# Patient Record
Sex: Male | Born: 1940 | Race: White | Hispanic: No | Marital: Married | Smoking: Former smoker
Health system: Southern US, Community
[De-identification: ages and names within clinical notes are randomized; demographics above are authoritative.]

## PROBLEM LIST (undated history)

## (undated) DIAGNOSIS — I519 Heart disease, unspecified: Secondary | ICD-10-CM

## (undated) DIAGNOSIS — M1990 Unspecified osteoarthritis, unspecified site: Secondary | ICD-10-CM

## (undated) DIAGNOSIS — I517 Cardiomegaly: Secondary | ICD-10-CM

## (undated) DIAGNOSIS — I4811 Longstanding persistent atrial fibrillation: Secondary | ICD-10-CM

## (undated) DIAGNOSIS — I428 Other cardiomyopathies: Secondary | ICD-10-CM

## (undated) DIAGNOSIS — Z7901 Long term (current) use of anticoagulants: Secondary | ICD-10-CM

## (undated) DIAGNOSIS — E785 Hyperlipidemia, unspecified: Secondary | ICD-10-CM

## (undated) DIAGNOSIS — N400 Enlarged prostate without lower urinary tract symptoms: Secondary | ICD-10-CM

## (undated) DIAGNOSIS — E669 Obesity, unspecified: Secondary | ICD-10-CM

## (undated) DIAGNOSIS — E119 Type 2 diabetes mellitus without complications: Secondary | ICD-10-CM

## (undated) DIAGNOSIS — G4730 Sleep apnea, unspecified: Secondary | ICD-10-CM

## (undated) DIAGNOSIS — I10 Essential (primary) hypertension: Secondary | ICD-10-CM

## (undated) DIAGNOSIS — R7303 Prediabetes: Secondary | ICD-10-CM

## (undated) HISTORY — DX: Longstanding persistent atrial fibrillation: I48.11

## (undated) HISTORY — DX: Unspecified osteoarthritis, unspecified site: M19.90

## (undated) HISTORY — DX: Hyperlipidemia, unspecified: E78.5

## (undated) HISTORY — DX: Other cardiomyopathies: I42.8

## (undated) HISTORY — DX: Sleep apnea, unspecified: G47.30

## (undated) HISTORY — DX: Long term (current) use of anticoagulants: Z79.01

## (undated) HISTORY — DX: Cardiomegaly: I51.7

## (undated) HISTORY — DX: Obesity, unspecified: E66.9

## (undated) HISTORY — PX: VENTRAL HERNIA REPAIR: SHX424

## (undated) HISTORY — DX: Essential (primary) hypertension: I10

## (undated) HISTORY — DX: Heart disease, unspecified: I51.9

---

## 1999-11-12 ENCOUNTER — Encounter: Payer: Self-pay | Admitting: Pulmonary Disease

## 2005-03-10 ENCOUNTER — Ambulatory Visit (HOSPITAL_COMMUNITY): Admission: RE | Admit: 2005-03-10 | Discharge: 2005-03-10 | Payer: Self-pay | Admitting: Internal Medicine

## 2005-03-10 ENCOUNTER — Ambulatory Visit: Payer: Self-pay | Admitting: Internal Medicine

## 2005-03-10 HISTORY — PX: COLONOSCOPY: SHX174

## 2007-12-07 ENCOUNTER — Ambulatory Visit: Payer: Self-pay | Admitting: Pulmonary Disease

## 2007-12-07 DIAGNOSIS — I10 Essential (primary) hypertension: Secondary | ICD-10-CM | POA: Insufficient documentation

## 2007-12-12 ENCOUNTER — Telehealth (INDEPENDENT_AMBULATORY_CARE_PROVIDER_SITE_OTHER): Payer: Self-pay | Admitting: *Deleted

## 2008-02-08 ENCOUNTER — Ambulatory Visit: Payer: Self-pay | Admitting: Pulmonary Disease

## 2008-02-29 ENCOUNTER — Encounter: Payer: Self-pay | Admitting: Pulmonary Disease

## 2008-04-14 ENCOUNTER — Ambulatory Visit (HOSPITAL_COMMUNITY): Admission: RE | Admit: 2008-04-14 | Discharge: 2008-04-15 | Payer: Self-pay | Admitting: Surgery

## 2008-04-14 ENCOUNTER — Encounter (INDEPENDENT_AMBULATORY_CARE_PROVIDER_SITE_OTHER): Payer: Self-pay | Admitting: Surgery

## 2008-06-10 ENCOUNTER — Encounter: Payer: Self-pay | Admitting: Pulmonary Disease

## 2008-09-05 ENCOUNTER — Ambulatory Visit: Payer: Self-pay | Admitting: Pulmonary Disease

## 2008-10-31 ENCOUNTER — Encounter: Payer: Self-pay | Admitting: Pulmonary Disease

## 2008-12-10 ENCOUNTER — Encounter: Payer: Self-pay | Admitting: Pulmonary Disease

## 2009-03-06 ENCOUNTER — Ambulatory Visit (HOSPITAL_COMMUNITY): Admission: RE | Admit: 2009-03-06 | Discharge: 2009-03-08 | Payer: Self-pay | Admitting: Surgery

## 2010-07-09 ENCOUNTER — Encounter (INDEPENDENT_AMBULATORY_CARE_PROVIDER_SITE_OTHER): Payer: Self-pay | Admitting: *Deleted

## 2010-07-09 ENCOUNTER — Encounter: Payer: Self-pay | Admitting: Cardiology

## 2010-07-09 LAB — CONVERTED CEMR LAB
HDL: 34 mg/dL
LDL Cholesterol: 110 mg/dL
LDL Cholesterol: 110 mg/dL
Triglycerides: 176 mg/dL

## 2010-07-16 ENCOUNTER — Encounter (INDEPENDENT_AMBULATORY_CARE_PROVIDER_SITE_OTHER): Payer: Self-pay | Admitting: *Deleted

## 2010-07-16 LAB — CONVERTED CEMR LAB: TSH: 2.024 microintl units/mL

## 2010-07-22 ENCOUNTER — Ambulatory Visit (HOSPITAL_COMMUNITY): Admission: RE | Admit: 2010-07-22 | Discharge: 2010-07-22 | Payer: Self-pay | Admitting: Internal Medicine

## 2010-07-22 ENCOUNTER — Ambulatory Visit: Payer: Self-pay | Admitting: Cardiology

## 2010-07-22 ENCOUNTER — Encounter (INDEPENDENT_AMBULATORY_CARE_PROVIDER_SITE_OTHER): Payer: Self-pay | Admitting: Internal Medicine

## 2010-07-23 ENCOUNTER — Encounter (INDEPENDENT_AMBULATORY_CARE_PROVIDER_SITE_OTHER): Payer: Self-pay | Admitting: *Deleted

## 2010-07-29 ENCOUNTER — Ambulatory Visit: Payer: Self-pay | Admitting: Cardiology

## 2010-07-29 DIAGNOSIS — E785 Hyperlipidemia, unspecified: Secondary | ICD-10-CM | POA: Insufficient documentation

## 2010-08-12 ENCOUNTER — Ambulatory Visit: Payer: Self-pay | Admitting: Cardiology

## 2010-08-12 LAB — CONVERTED CEMR LAB: POC INR: 1.4

## 2010-08-19 ENCOUNTER — Ambulatory Visit: Payer: Self-pay | Admitting: Cardiology

## 2010-08-19 LAB — CONVERTED CEMR LAB: POC INR: 1.5

## 2010-08-24 ENCOUNTER — Encounter: Payer: Self-pay | Admitting: Cardiology

## 2010-08-25 ENCOUNTER — Ambulatory Visit: Payer: Self-pay | Admitting: Cardiology

## 2010-08-25 LAB — CONVERTED CEMR LAB: POC INR: 1.7

## 2010-09-02 ENCOUNTER — Ambulatory Visit: Payer: Self-pay | Admitting: Cardiology

## 2010-09-08 ENCOUNTER — Ambulatory Visit: Payer: Self-pay | Admitting: Cardiology

## 2010-09-15 ENCOUNTER — Ambulatory Visit: Payer: Self-pay | Admitting: Cardiology

## 2010-09-15 LAB — CONVERTED CEMR LAB: POC INR: 3.2

## 2010-09-29 ENCOUNTER — Ambulatory Visit: Payer: Self-pay | Admitting: Cardiovascular Disease

## 2010-10-12 ENCOUNTER — Ambulatory Visit: Payer: Self-pay | Admitting: Cardiology

## 2010-10-12 DIAGNOSIS — R609 Edema, unspecified: Secondary | ICD-10-CM

## 2010-10-12 LAB — CONVERTED CEMR LAB: POC INR: 2.6

## 2010-10-14 ENCOUNTER — Encounter: Payer: Self-pay | Admitting: Cardiology

## 2010-10-19 ENCOUNTER — Ambulatory Visit (HOSPITAL_COMMUNITY)
Admission: RE | Admit: 2010-10-19 | Discharge: 2010-10-19 | Payer: Self-pay | Source: Home / Self Care | Attending: Cardiology | Admitting: Cardiology

## 2010-10-19 ENCOUNTER — Encounter: Payer: Self-pay | Admitting: Cardiology

## 2010-11-08 ENCOUNTER — Ambulatory Visit
Admission: RE | Admit: 2010-11-08 | Discharge: 2010-11-08 | Payer: Self-pay | Source: Home / Self Care | Attending: Cardiology | Admitting: Cardiology

## 2010-11-08 LAB — CONVERTED CEMR LAB: POC INR: 2.2

## 2010-11-23 NOTE — Medication Information (Signed)
Summary: 2 wk protime per checkout on 07/29/10/tg  Anticoagulant Therapy  Managed by: Vashti Hey, RN PCP: Carylon Perches Supervising MD: Dietrich Pates MD, Molly Maduro Indication 1: Atrial Fibrillation Lab Used: [facility] [place] Site: [place] INR POC 1.4  Dietary changes: no    Health status changes: yes       Details: new onset Atrial Fib  Bleeding/hemorrhagic complications: no    Recent/future hospitalizations: no    Any changes in medication regimen? yes       Details: started on coumadin 5mg  qd on 07/30/10  Has 5mg  tablets  Recent/future dental: no  Any missed doses?: no       Is patient compliant with meds? yes       Allergies: No Known Drug Allergies  Anticoagulation Management History:      The patient comes in today for his initial visit for anticoagulation therapy.  He is being anticoagulated because of Atrial Fibrillation .  Positive risk factors for bleeding include an age of 7 years or older.  Negative risk factors for bleeding include no history of CVA/TIA, no history of GI bleeding, and absence of serious comorbidities.  The bleeding index is 'intermediate risk'.  Positive CHADS2 values include History of HTN.  Negative CHADS2 values include History of CHF, Age > 69 years old, History of Diabetes, and Prior Stroke/CVA/TIA.  The start date was 07/30/2010.  Anticoagulation responsible provider: Dietrich Pates MD, Molly Maduro.  INR POC: 1.4.  Cuvette Lot#: [id number].    Anticoagulation Management Assessment/Plan:      The patient's current anticoagulation dose is Warfarin sodium 5 mg tabs: Take 1 tablet by mouth once a day or as  directed by Anticoagulation Clinic.  The target INR is 2.0-3.0.  The next INR is due 08/19/2010.  Anticoagulation instructions were given to patient.  Results were reviewed/authorized by Vashti Hey, RN.  He was notified by Vashti Hey RN.        Coagulation management information includes: not pending DCCV.  Current Anticoagulation Instructions: INR  1.4 Increase coumadin to 7.5mg  once daily except 5mg  on Mondays, Wednesdays and Fridays

## 2010-11-23 NOTE — Assessment & Plan Note (Signed)
Summary: ***per Dr.Fagan for new onset A-Fib/tg   Visit Type:  Initial Consult Referring Provider:  . Primary Provider:  Carylon Perches   History of Present Illness: [name] is seen at the kind request of Dr. Ouida Sills for assessment and treatment of newly diagnosed atrial fibrillation.  This nice gentleman has no history of cardiac disease.  He has never been evaluated by a cardiologist nor has he undergone any significant cardiac testing.  He cannot recall his most recent EKG prior to the one Dr. Ouida Sills obtained a week ago.  Although patient has undergone 2 surgical procedures in the past 3 years, no prior EKGs are available in the [facility] system.   Chest discomfort, dyspnea, orthopnea, PND, lightheadedness and syncope are all denied.  Patient has noted some pedal edema in recent weeks or months.   Records obtained from Dr. Alonza Smoker office and reviewed.   Preventive Screening-Counseling & Management  Alcohol-Tobacco     Smoking Status: current  Current Medications (verified): 1)  Atenolol 100 Mg  Tabs (Atenolol) .... Take 1/2 Tab Two Times A Day 2)  Lisinopril 40 Mg  Tabs (Lisinopril) .... Take 1/2 Tab Two Times A Day 3)  Diltiazem Hcl Er Beads 300 Mg Xr24h-Cap (Diltiazem Hcl Er Beads) .... Take One Capsule By Mouth Daily 4)  Niacin Cr 500 Mg  Cpcr (Niacin) .... Once Daily 5)  Fish Oil   Oil (Fish Oil) .... Once Daily 6)  Warfarin Sodium 5 Mg Tabs (Warfarin Sodium) .... Take 1 Tablet By Mouth Once A Day or As  Directed By Anticoagulation Clinic  Allergies (verified): No Known Drug Allergies  Comments:  Nurse/Medical Assistant: patient reviewed med list that was sent over and put in computer  and he takes atenolol 40mg  1/2 tab two times a day,lisinopril 40 mg 1/2 tab two times a day,diltiazem 90mg  1 tab two times a day .   Past History:  Family History: Last updated: 08-02-2010 Father died at age 71 due to myocardial infarction Mother died at age 24 as the result of an  intracranial neoplasm. Siblings: One sister is alive and well.  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  Past Medical History: Atrial fibrillation-new onset in 06/2010 Hypertension Hyperlipidemia Obstructive sleep apnea-CPAP utilized Degenerative joint disease-right knee     Past Surgical History: Repair of ventral hernia -03/2008 02/2009 Colonoscopy 03/10/2005  EKG  Procedure date:  07/16/2010  Findings:      Atrial fibrillation with a ventricular rate of 160 bpm Slightly delayed R-wave progression Prominent QRS voltage Nondiagnostic inferior Q waves Nondiagnostic T-wave abnormalities No previous tracings for comparison  CXR  Procedure date:  04/11/2008  Findings:       Findings: The heart size and mediastinal contours are normal.  The   lungs are clear.  There is no pleural effusion.  There is probable   post-traumatic deformity of the mid left clavicle.  No acute   osseous findings are seen.    IMPRESSION:   No active cardiopulmonary process.    Read By:  Gerrianne Scale,  M.D.  Echocardiogram  Procedure date:  07/22/2010  Findings:       Indications:   Atrial fibrillation - 427.31.      Study Conclusions    - Left ventricle: The cavity size was mildly dilated. Wall thickness     was increased increased in a pattern of mild to moderate LVH.  Systolic function was mildly to moderately reduced. The estimated     ejection fraction was in the range of 40% to 45%. The apparent     reduction in LV systolic function may be secondary to impaired     diastolic filling resulting from elevated heart rate in AF     alone; there may be no true impairment of contractility. Wall     motion was normal; there were no regional wall motion     abnormalities.   - Left atrium:  moderately dilated.   - Right ventricle: The cavity size was normal.  Wall thickness was     increased. Systolic function was mildly reduced.   - Right atrium: mildly dilated.   - Atrial septum: No defect or patent foramen ovale    LV: mildly dilated; mild to moderate LVH;  EF: 40% to 45%;         no regional wall motion abnormalities. AV:  normal valve. Aorta:  normal MV: Structurally normal; Trivial regurgitation. Left atrium: The atrium was moderately dilated. Atrial septum:normal WJ:XBJYNWGN function was mildly reduced. PV: normal TV: normal; trivial TR  PA: Nl MPA;  Systolic BP could not be accurately estimated. RA:  mildly dilated. Pericardium: no pericardial effusion.     Family History: Father died at age 54 due to myocardial infarction Mother died at age 90 as the result of an intracranial neoplasm. Siblings: One sister is alive and well.  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   Other systems reviewed and are negative.  Vital Signs:  Patient profile:   70 year old male Weight:      240 pounds BMI:     33.59 Pulse rate:   77 / minute BP sitting:   122 / 88  (right arm)  Vitals Entered By: Dreama Saa, CNA (July 29, 2010 1:43 PM)  Physical Exam  General:  Mildly overweight; well-developed; no acute distress: HEENT-[place]/AT; PERRL; EOM intact; conjunctiva and lids nl:  Neck-No JVD; no carotid bruits: Endocrine-slight diffuse thyromegaly: Lungs-No tachypnea, clear without rales, rhonchi or wheezes: CV-cannot palpate PMI; normal S1 and S2; irregular rhythm Abdomen-BS normal; soft and non-tender without masses or organomegaly: MS-No deformities, cyanosis or clubbing: Neurologic-Nl cranial nerves; symmetric strength and tone: Skin- Warm, no sig.  lesions: Extremities-Nl distal pulses; 1-2+ ankle edema    Impression & Recommendations:  Problem # 1:  ATRIAL FIBRILLATION (ICD-427.31) [name] is asymptomatic in atrial fibrillation with a slightly rapid rate.  His antihypertensive regime, which included beta blocker and calcium channel antagonist, protected him from a rapid heart rate and from symptoms.  Diltiazem will be increased to 300 mg q.d. of a long-acting preparation and he will return in 2 weeks for reassessment of rhythm and heart rate.    Anticoagulation with warfarin will be initiated.  Patient has a CHADS risk score of 2 in the most sensitive system and is at low to moderate risk for thromboembolism.  Left ventricular systolic function is mildly impaired, and the patient is asymptomatic.  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  EF will be reassessed once her heart rate has been adequately controlled.  I will plan to see this nice gentleman again in 4 months.  The following medications were removed from the medication  list:    Low-dose Aspirin 81 Mg Tabs (Aspirin) .Marland Kitchen... Take 1 tablet by mouth once a day His updated medication list for this problem includes:    Atenolol 100 Mg Tabs (Atenolol) .Marland Kitchen... Take 1/2 tab two times a day    Warfarin Sodium 5 Mg Tabs (Warfarin sodium) .Marland Kitchen... Take 1 tablet by mouth once a day or as  directed by anticoagulation clinic  Other Orders: 2-D Echocardiogram (2D Echo)  Patient Instructions: 1)  Your physician recommends that you schedule a follow-up appointment in: 2 months 2)  Your physician has recommended you make the following change in your medication: stop aspirin, start warfarin 5 mg daily ,diltiazem 180mg  dialy 3)  You have been referred to coumadin visit in 2 weeks, nurse visit for rhythm strip in 2 weeks also 4)  Your physician has requested that you have an echocardiogram.  Echocardiography is a painless test that uses sound waves to create images of your heart. It  provides your doctor with information about the size and shape of your heart and how well your heart's chambers and valves are working.  This procedure takes approximately one hour. There are no restrictions for this procedure. Prescriptions: WARFARIN SODIUM 5 MG TABS (WARFARIN SODIUM) Take 1 tablet by mouth once a day or as  directed by Anticoagulation Clinic  #60 x 1   Entered by:   Teressa Lower RN   Authorized by:   Kathlen Brunswick, MD, [facility]   Signed by:   Teressa Lower RN on 07/29/2010   Method used:   Electronically to        [facility]  [address] (retail)       [address]       [place], [place]  [address]       Ph: [phone]       Fax: [phone]   RxID:   [id number] DILTIAZEM HCL ER BEADS 300 MG XR24H-CAP (DILTIAZEM HCL ER BEADS) Take one capsule by mouth daily  #30 x 3   Entered by:   Teressa Lower RN   Authorized by:   Kathlen Brunswick, MD, [facility]   Signed by:   Teressa Lower RN on 07/29/2010   Method used:   Electronically to        [facility]  [address] (retail)       [address]       [place], [place]  [address]       Ph: [phone]       Fax: [phone]   RxID:   [id number]

## 2010-11-23 NOTE — Miscellaneous (Signed)
Summary:  Tsh per Dr. Ouida Sills  Clinical Lists Changes  Observations: Added new observation of TSH: 2.024 microintl units/mL (07/16/2010 16:23)

## 2010-11-23 NOTE — Medication Information (Signed)
Summary: ccr-lr  Anticoagulant Therapy  Managed by: Vashti Hey, RN PCP: Carylon Perches Supervising MD: Dietrich Pates MD, Molly Maduro Indication 1: Atrial Fibrillation Lab Used: [facility] [place] Site: [place] INR POC 2.5  Dietary changes: no    Health status changes: no    Bleeding/hemorrhagic complications: no    Recent/future hospitalizations: no    Any changes in medication regimen? no    Recent/future dental: no  Any missed doses?: no       Is patient compliant with meds? yes       Allergies: No Known Drug Allergies  Anticoagulation Management History:      The patient is taking warfarin and comes in today for a routine follow up visit.  Warfarin therapy is being given due to Atrial Fibrillation .  Positive risk factors for bleeding include an age of 69 years or older.  Negative risk factors for bleeding include no history of CVA/TIA, no history of GI bleeding, and absence of serious comorbidities.  The bleeding index is 'intermediate risk'.  Positive CHADS2 values include History of HTN.  Negative CHADS2 values include History of CHF, Age > 89 years old, History of Diabetes, and Prior Stroke/CVA/TIA.  The start date was 07/30/2010.  Anticoagulation responsible Nyeisha Goodall: Dietrich Pates MD, Molly Maduro.  INR POC: 2.5.  Cuvette Lot#: [id number].    Anticoagulation Management Assessment/Plan:      The patient's current anticoagulation dose is Warfarin sodium 10 mg tabs: Take 1 tablet once daily or as directed by coumadin clinic.  The target INR is 2.0-3.0.  The next INR is due 09/15/2010.  Anticoagulation instructions were given to patient.  Results were reviewed/authorized by Vashti Hey, RN.  He was notified by Vashti Hey RN.         Prior Anticoagulation Instructions: INR 1.8 Increase coumadin to 10mg  once daily   Current Anticoagulation Instructions: INR 2.5 Continue coumadin 10mg  once daily Prescriptions: WARFARIN SODIUM 10 MG TABS (WARFARIN SODIUM) Take 1 tablet once daily or  as directed by coumadin clinic  #30 x 3   Entered by:   Vashti Hey RN   Authorized by:   Kathlen Brunswick, MD, [facility]   Signed by:   Vashti Hey RN on 09/08/2010   Method used:   Electronically to        [facility]  [address] (retail)       [address]       [place], [place]  [address]       Ph: [phone]       Fax: [phone]   RxID:   [id number]

## 2010-11-23 NOTE — Medication Information (Signed)
Summary: ccr-lr  Anticoagulant Therapy  Managed by: Joshua Hey, RN PCP: Joshua [name] Supervising MD: Diona Browner MD, Remi Deter Indication 1: Atrial Fibrillation Lab Used: [facility] [place] Site: [place] INR POC 1.8  Dietary changes: no    Health status changes: no    Bleeding/hemorrhagic complications: no    Recent/future hospitalizations: no    Any changes in medication regimen? no    Recent/future dental: no  Any missed doses?: no       Is patient compliant with meds? yes       Allergies: No Known Drug Allergies  Anticoagulation Management History:      The patient is taking warfarin and comes in today for a routine follow up visit.  Warfarin therapy is being given due to Atrial Fibrillation .  Positive risk factors for bleeding include an age of 51 years or older.  Negative risk factors for bleeding include no history of CVA/TIA, no history of GI bleeding, and absence of serious comorbidities.  The bleeding index is 'intermediate risk'.  Positive CHADS2 values include History of HTN.  Negative CHADS2 values include History of CHF, Age > 56 years old, History of Diabetes, and Prior Stroke/CVA/TIA.  The start date was 07/30/2010.  Anticoagulation responsible provider: Diona Browner MD, Remi Deter.  INR POC: 1.8.  Cuvette Lot#: [id number].    Anticoagulation Management Assessment/Plan:      The patient's current anticoagulation dose is Warfarin sodium 5 mg tabs: Take 1 tablet by mouth once a day or as  directed by Anticoagulation Clinic.  The target INR is 2.0-3.0.  The next INR is due 09/08/2010.  Anticoagulation instructions were given to patient.  Results were reviewed/authorized by Joshua Hey, RN.  He was notified by Joshua Hey RN.         Prior Anticoagulation Instructions: INR 1.7 Increase coumadin to 7.5mg  once daily except 10mg  on Mondays, Wednesdays and Fridays  Current Anticoagulation Instructions: INR 1.8 Increase coumadin to 10mg  once daily

## 2010-11-23 NOTE — Medication Information (Signed)
Summary: ccr-lr  Anticoagulant Therapy  Managed by: Vashti Hey, RN PCP: Carylon Perches Supervising MD: Eden Emms MD, Theron Arista Indication 1: Atrial Fibrillation Lab Used: [facility] [place] Site: [place] INR POC 2.4  Dietary changes: no    Health status changes: no    Bleeding/hemorrhagic complications: no    Recent/future hospitalizations: no    Any changes in medication regimen? no    Recent/future dental: no  Any missed doses?: no       Is patient compliant with meds? yes       Allergies: No Known Drug Allergies  Anticoagulation Management History:      The patient is taking warfarin and comes in today for a routine follow up visit.  Warfarin therapy is being given due to Atrial Fibrillation .  Positive risk factors for bleeding include an age of 69 years or older.  Negative risk factors for bleeding include no history of CVA/TIA, no history of GI bleeding, and absence of serious comorbidities.  The bleeding index is 'intermediate risk'.  Positive CHADS2 values include History of HTN.  Negative CHADS2 values include History of CHF, Age > 82 years old, History of Diabetes, and Prior Stroke/CVA/TIA.  The start date was 07/30/2010.  Anticoagulation responsible provider: Eden Emms MD, Theron Arista.  INR POC: 2.4.  Cuvette Lot#: [id number].    Anticoagulation Management Assessment/Plan:      The patient's current anticoagulation dose is Warfarin sodium 10 mg tabs: Take 1 tablet once daily or as directed by coumadin clinic.  The target INR is 2.0-3.0.  The next INR is due 10/12/2010.  Anticoagulation instructions were given to patient.  Results were reviewed/authorized by Vashti Hey, RN.  He was notified by Vashti Hey RN.         Prior Anticoagulation Instructions: INR 3.2 Decrease dose to 10mg  once daily except 5mg  on Wednesdays  Current Anticoagulation Instructions: INR 2.4 Continue coumadin 10mg  once daily except 5mg  on Wednesdays

## 2010-11-23 NOTE — Assessment & Plan Note (Signed)
Summary: 2 wk nurse visit and rhythm strip per checkout on 07/29/10/tg  Nurse Visit   Vital Signs:  Patient profile:   70 year old male Weight:      236 pounds O2 Sat:      96 % on Room air Pulse rate:   70 / minute BP sitting:   130 / 87  (left arm)  Vitals Entered By: Larita Fife Via LPN (August 12, 2010 4:17 PM)  O2 Flow:  Room air  Visit Type:  Rhythm strip/HR check with nurse Referring Provider:  . Primary Provider:  Carylon Perches   History of Present Illness: S: Pt. arrives in office for Coumadin check, rhythm strip and HR check. B: On last OV on 07-29-10 with Dr. Dietrich Pates pt. was advised to increase Diltiazem to 300mg  by mouth once daily and start taking Coumadin. A: Pt. has no complaints at this time. Rhythm strip obtained. Wants to know if he can go back on Diltiazem HCL (even if he has to take three times a day) due to the cost of the long acting Diltiazem. Please advise. R: We will call pt. with Dr. Marvel Plan recommendations.  08/23/10      Noted. [place] Bing, M.D.  Pt. advised.      Larita Fife Via LPN  August 24, 2010 10:07 AM     Current Medications (verified): 1)  Atenolol 100 Mg  Tabs (Atenolol) .... Take 1/2 Tab Two Times A Day 2)  Lisinopril 40 Mg  Tabs (Lisinopril) .... Take 1/2 Tab Two Times A Day 3)  Diltiazem Hcl Er Beads 300 Mg Xr24h-Cap (Diltiazem Hcl Er Beads) .... Take One Capsule By Mouth Daily 4)  Niacin Cr 500 Mg  Cpcr (Niacin) .... Once Daily 5)  Fish Oil   Oil (Fish Oil) .... Once Daily 6)  Warfarin Sodium 5 Mg Tabs (Warfarin Sodium) .... Take 1 Tablet By Mouth Once A Day or As  Directed By Anticoagulation Clinic  Allergies (verified): No Known Drug Allergies  Appended Document: 2 wk nurse visit and rhythm strip per checkout on 07/29/10/tg (see nurse vist from 10-20)) Pt. wants to know if he can go back on Diltiazem HCL (even if he has to take three times a day) due to the cost of the long acting Diltiazem. Please advise  08/29/10 diltiazem CD can  be changed to diltiazem 120 mg t.i.d.  [place] Bing, M.D.  Pt. advised. Larita Fife Via LPN  August 30, 2010 10:32 AM

## 2010-11-23 NOTE — Miscellaneous (Signed)
Summary: LIPID ,07/09/2010,TSH 07/16/2010  Clinical Lists Changes  Observations: Added new observation of TSH: 2.024 microintl units/mL (07/16/2010 10:48) Added new observation of LDL: 110 mg/dL (96/01/5408 81:19) Added new observation of HDL: 34 mg/dL (14/78/2956 21:30) Added new observation of TRIGLYC TOT: 176 mg/dL (86/57/8469 62:95) Added new observation of CHOLESTEROL: 179 mg/dL (28/41/3244 01:02)

## 2010-11-23 NOTE — Medication Information (Signed)
Summary: ccr-lr  Anticoagulant Therapy  Managed by: Vashti Hey, RN PCP: Carylon Perches Supervising MD: Diona Browner MD, Remi Deter Indication 1: Atrial Fibrillation Lab Used: [facility] [place] Site: [place] INR POC 1.5  Dietary changes: no    Health status changes: no    Bleeding/hemorrhagic complications: no    Recent/future hospitalizations: no    Any changes in medication regimen? no    Recent/future dental: no  Any missed doses?: no       Is patient compliant with meds? yes       Allergies: No Known Drug Allergies  Anticoagulation Management History:      The patient is taking warfarin and comes in today for a routine follow up visit.  He is being anticoagulated because of Atrial Fibrillation .  Positive risk factors for bleeding include an age of 23 years or older.  Negative risk factors for bleeding include no history of CVA/TIA, no history of GI bleeding, and absence of serious comorbidities.  The bleeding index is 'intermediate risk'.  Positive CHADS2 values include History of HTN.  Negative CHADS2 values include History of CHF, Age > 41 years old, History of Diabetes, and Prior Stroke/CVA/TIA.  The start date was 07/30/2010.  Anticoagulation responsible provider: Diona Browner MD, Remi Deter.  INR POC: 1.5.  Cuvette Lot#: [id number].    Anticoagulation Management Assessment/Plan:      The patient's current anticoagulation dose is Warfarin sodium 5 mg tabs: Take 1 tablet by mouth once a day or as  directed by Anticoagulation Clinic.  The target INR is 2.0-3.0.  The next INR is due 08/25/2010.  Anticoagulation instructions were given to patient.  Results were reviewed/authorized by Vashti Hey, RN.  He was notified by Vashti Hey RN.         Prior Anticoagulation Instructions: INR 1.4 Increase coumadin to 7.5mg  once daily except 5mg  on Mondays, Wednesdays and Fridays  Current Anticoagulation Instructions: INR 1.5 Increase coumadin to 7.5mg  once daily

## 2010-11-23 NOTE — Medication Information (Signed)
Summary: ccr-lr  Anticoagulant Therapy  Managed by: Vashti Hey, RN PCP: Carylon Perches Supervising MD: Diona Browner MD, Remi Deter Indication 1: Atrial Fibrillation Lab Used: [facility] [place] Site: [place] INR POC 3.2  Dietary changes: no    Health status changes: no    Bleeding/hemorrhagic complications: no    Recent/future hospitalizations: no    Any changes in medication regimen? no    Recent/future dental: no  Any missed doses?: no       Is patient compliant with meds? yes       Allergies: No Known Drug Allergies  Anticoagulation Management History:      The patient is taking warfarin and comes in today for a routine follow up visit.  Warfarin therapy is being given due to Atrial Fibrillation .  Positive risk factors for bleeding include an age of 69 years or older.  Negative risk factors for bleeding include no history of CVA/TIA, no history of GI bleeding, and absence of serious comorbidities.  The bleeding index is 'intermediate risk'.  Positive CHADS2 values include History of HTN.  Negative CHADS2 values include History of CHF, Age > 23 years old, History of Diabetes, and Prior Stroke/CVA/TIA.  The start date was 07/30/2010.  Anticoagulation responsible Karem Farha: Diona Browner MD, Remi Deter.  INR POC: 3.2.  Cuvette Lot#: [id number].    Anticoagulation Management Assessment/Plan:      The patient's current anticoagulation dose is Warfarin sodium 10 mg tabs: Take 1 tablet once daily or as directed by coumadin clinic.  The target INR is 2.0-3.0.  The next INR is due 09/29/2010.  Anticoagulation instructions were given to patient.  Results were reviewed/authorized by Vashti Hey, RN.  He was notified by Vashti Hey RN.         Prior Anticoagulation Instructions: INR 2.5 Continue coumadin 10mg  once daily  Current Anticoagulation Instructions: INR 3.2 Decrease dose to 10mg  once daily except 5mg  on Wednesdays

## 2010-11-23 NOTE — Medication Information (Signed)
Summary: ccr-lr  Anticoagulant Therapy  Managed by: Vashti Hey, RN PCP: Carylon Perches Supervising MD: Dietrich Pates MD, Molly Maduro Indication 1: Atrial Fibrillation Lab Used: [facility] [place] Site: [place] INR POC 1.7  Dietary changes: no    Health status changes: no    Bleeding/hemorrhagic complications: no    Recent/future hospitalizations: no    Any changes in medication regimen? no    Recent/future dental: no  Any missed doses?: no       Is patient compliant with meds? yes       Allergies: No Known Drug Allergies  Anticoagulation Management History:      The patient is taking warfarin and comes in today for a routine follow up visit.  Warfarin therapy is being given due to Atrial Fibrillation .  Positive risk factors for bleeding include an age of 69 years or older.  Negative risk factors for bleeding include no history of CVA/TIA, no history of GI bleeding, and absence of serious comorbidities.  The bleeding index is 'intermediate risk'.  Positive CHADS2 values include History of HTN.  Negative CHADS2 values include History of CHF, Age > 69 years old, History of Diabetes, and Prior Stroke/CVA/TIA.  The start date was 07/30/2010.  Anticoagulation responsible provider: Dietrich Pates MD, Molly Maduro.  INR POC: 1.7.  Cuvette Lot#: [id number].    Anticoagulation Management Assessment/Plan:      The patient's current anticoagulation dose is Warfarin sodium 5 mg tabs: Take 1 tablet by mouth once a day or as  directed by Anticoagulation Clinic.  The target INR is 2.0-3.0.  The next INR is due 08/26/2010.  Anticoagulation instructions were given to patient.  Results were reviewed/authorized by Vashti Hey, RN.  He was notified by Vashti Hey RN.         Prior Anticoagulation Instructions: INR 1.5 Increase coumadin to 7.5mg  once daily   Current Anticoagulation Instructions: INR 1.7 Increase coumadin to 7.5mg  once daily except 10mg  on Mondays, Wednesdays and Fridays

## 2010-11-24 ENCOUNTER — Encounter (INDEPENDENT_AMBULATORY_CARE_PROVIDER_SITE_OTHER): Payer: Self-pay | Admitting: *Deleted

## 2010-11-25 NOTE — Assessment & Plan Note (Signed)
Summary: 2 mth f/u per checkout on 07/29/10/tg/needs INR today   Visit Type:  Follow-up Referring Provider:  . Primary Provider:  Carylon Perches   History of Present Illness: Joshua [name] returns to the office for continued assessment and treatment of atrial fibrillation.  Since his last visit, he has done superbly.  He denies chest discomfort, exertional dyspnea, orthopnea, PND, dizziness or syncope.  He has been physically active without any impairment of exercise tolerance.  He notes no palpitations.  Current Medications (verified): 1)  Atenolol 100 Mg  Tabs (Atenolol) .... Take 1/2 Tab Two Times A Day 2)  Lisinopril 40 Mg  Tabs (Lisinopril) .... Take 1/2 Tab Two Times A Day 3)  Diltiazem Hcl 120 Mg Tabs (Diltiazem Hcl) .... Take 1 Tablet By Mouth Three Times A Day 4)  Niacin Cr 500 Mg  Cpcr (Niacin) .... Once Daily 5)  Fish Oil   Oil (Fish Oil) .... Once Daily 6)  Warfarin Sodium 10 Mg Tabs (Warfarin Sodium) .... Take 1 Tablet Once Daily or As Directed By Coumadin Clinic  Allergies (verified): No Known Drug Allergies  Comments:  Nurse/Medical Assistant: patient brought meds also he uses [facility] in [place]  Past History:  PMH, FH, and Social History reviewed and updated.  Review of Systems       See history of present illness.  Vital Signs:  Patient profile:   70 year old male Weight:      246 pounds O2 Sat:      97 % on Room air Pulse rate:   78 / minute BP sitting:   129 / 81  (right arm)  Vitals Entered By: Dreama Saa, CNA (October 12, 2010 3:22 PM)  O2 Flow:  Room air  Physical Exam  General:  Moderately overweight; well-developed; no acute distress Neck-No JVD; no carotid bruits: Lungs-No tachypnea, clear without rales, rhonchi or wheezes: CV-cannot palpate PMI; normal S1 and S2; irregular rhythm; modest basilar early-systolic ejection murmur Abdomen-BS normal; soft and non-tender without masses or organomegaly: MS-No deformities, cyanosis or  clubbing: Neurologic-Nl cranial nerves; symmetric strength and tone: Skin- Warm; chronic stasis changes in the lower extremities. Extremities-Nl distal pulses; 1-2+ ankle edema    EKG  Procedure date:  10/12/2010  Findings:      Rhythm Strip & 6 Minute Walk Test  Baseline: Atrial fibrillation with controlled ventricular response; heart rate of 75 bpm; oxygen saturation of 98%  Post-exercise: Covered 800 feet without difficulty; heart rate increased to only 88 bpm.  Oxygen saturation-94%.  Impression: Adequate exercise tolerance; excellent heart rate control; normal oxygen saturation.   Impression & Recommendations:  Problem # 1:  ATRIAL FIBRILLATION (ICD-427.31) Heart rate is now well controlled in atrial fibrillation, and patient is asymptomatic.  The likelihood of a durable returned to sinus rhythm without antiarrhythmic therapy is low.  Accordingly, cardioversion will be undertaken.  We will continue the strategy of rate control and anticoagulation.  Initial echocardiogram 3 months ago showed mildly impaired left ventricular systolic function, which may have been a consequence of  inadequate control of heart rate.  Echocardiogram will be repeated.  Problem # 2:  HYPERLIPIDEMIA (ICD-272.4) Adequate control as noted below with modest therapy.  CHOL: 179 (07/09/2010)   LDL: 110 (07/09/2010)   HDL: 34 (07/09/2010)   TG: 176 (07/09/2010)  Problem # 3:  HYPERTENSION (ICD-401.9) Excellent blood pressure control on current therapy, which will be continued.  Problem # 4:  PEDAL EDEMA (ICD-782.3) Likely etiology is venous insufficiency.  In  the absence of any compromise of skin integrity, only conservative measures of salt restriction and leg elevation will be instituted for the present.  Diltiazem may be contributing to peripheral edema, but is necessary for adequate heart rate control.  Other Orders: 2-D Echocardiogram (2D Echo)  Patient Instructions: 1)  Your physician  recommends that you schedule a follow-up appointment in: 6 MONTHS 2)  Your physician has requested that you have an echocardiogram.  Echocardiography is a painless test that uses sound waves to create images of your heart. It provides your doctor with information about the size and shape of your heart and how well your heart's chambers and valves are working.  This procedure takes approximately one hour. There are no restrictions for this procedure. 3)  LEG ELEVATION 4)  SALT RESTRICTION

## 2010-11-25 NOTE — Medication Information (Signed)
Summary: 4 WK PROTIME PER CHECKOUT ON 10/12/10/TG  Anticoagulant Therapy  Managed by: Vashti Hey, RN PCP: Carylon Perches Supervising MD: Diona Browner MD, Remi Deter Indication 1: Atrial Fibrillation Lab Used: [facility] [place] Site: [place] INR POC 2.2  Dietary changes: no    Health status changes: no    Bleeding/hemorrhagic complications: no    Recent/future hospitalizations: no    Any changes in medication regimen? no    Recent/future dental: no  Any missed doses?: no       Is patient compliant with meds? yes       Allergies: No Known Drug Allergies  Anticoagulation Management History:      The patient is taking warfarin and comes in today for a routine follow up visit.  Warfarin therapy is being given due to Atrial Fibrillation .  Positive risk factors for bleeding include an age of 69 years or older.  Negative risk factors for bleeding include no history of CVA/TIA, no history of GI bleeding, and absence of serious comorbidities.  The bleeding index is 'intermediate risk'.  Positive CHADS2 values include History of HTN.  Negative CHADS2 values include History of CHF, Age > 69 years old, History of Diabetes, and Prior Stroke/CVA/TIA.  The start date was 07/30/2010.  Anticoagulation responsible provider: Diona Browner MD, Remi Deter.  INR POC: 2.2.  Cuvette Lot#: [id number].  Exp: 06/2011.    Anticoagulation Management Assessment/Plan:      The patient's current anticoagulation dose is Warfarin sodium 10 mg tabs: Take 1 tablet once daily or as directed by coumadin clinic.  The target INR is 2.0-3.0.  The next INR is due 12/09/2010.  Anticoagulation instructions were given to patient.  Results were reviewed/authorized by Vashti Hey, RN.  He was notified by Vashti Hey RN.         Prior Anticoagulation Instructions: INR 2.6 TODAY  CONTINUE CURRENT DOSE OF 0.5 TABLET ON WEDNESDAY AND 1 TABLET ALL OTHER DAYS  Current Anticoagulation Instructions: INR 2.2 Continue coumadin 10mg  once  daily except 5mg  on Wednesdays

## 2010-11-25 NOTE — Miscellaneous (Signed)
Summary: Lipid  Clinical Lists Changes  Observations: Added new observation of LDL: 110 mg/dL (16/07/9603 54:09) Added new observation of HDL: 34 mg/dL (81/19/1478 29:56) Added new observation of TRIGLYC TOT: 176 mg/dL (21/30/8657 84:69) Added new observation of CHOLESTEROL: 179 mg/dL (62/95/2841 32:44) Added new observation of TSH: 2.024 microintl units/mL (07/09/2010 11:15)

## 2010-11-25 NOTE — Medication Information (Signed)
Summary: protime/tg  Anticoagulant Therapy  Managed by: Teressa Lower, RN PCP: Carylon Perches Supervising MD: Eden Emms MD, Theron Arista Indication 1: Atrial Fibrillation Lab Used: [facility] [place] Site: [place] INR POC 2.6  Dietary changes: no    Health status changes: no    Bleeding/hemorrhagic complications: no    Recent/future hospitalizations: no    Any changes in medication regimen? no    Recent/future dental: no  Any missed doses?: no       Is patient compliant with meds? yes       Current Medications (verified): 1)  Atenolol 100 Mg  Tabs (Atenolol) .... Take 1/2 Tab Two Times A Day 2)  Lisinopril 40 Mg  Tabs (Lisinopril) .... Take 1/2 Tab Two Times A Day 3)  Diltiazem Hcl 120 Mg Tabs (Diltiazem Hcl) .... Take 1 Tablet By Mouth Three Times A Day 4)  Niacin Cr 500 Mg  Cpcr (Niacin) .... Once Daily 5)  Fish Oil   Oil (Fish Oil) .... Once Daily 6)  Warfarin Sodium 10 Mg Tabs (Warfarin Sodium) .... Take 1 Tablet Once Daily or As Directed By Coumadin Clinic  Allergies (verified): No Known Drug Allergies  Anticoagulation Management History:      The patient is taking warfarin and comes in today for a routine follow up visit.  Warfarin therapy is being given due to Atrial Fibrillation .  Positive risk factors for bleeding include an age of 69 years or older.  Negative risk factors for bleeding include no history of CVA/TIA, no history of GI bleeding, and absence of serious comorbidities.  The bleeding index is 'intermediate risk'.  Positive CHADS2 values include History of HTN.  Negative CHADS2 values include History of CHF, Age > 75 years old, History of Diabetes, and Prior Stroke/CVA/TIA.  The start date was 07/30/2010.  Anticoagulation responsible provider: Eden Emms MD, Theron Arista.  INR POC: 2.6.  Cuvette Lot#: [id number].  Exp: 06/2011.    Anticoagulation Management Assessment/Plan:      The patient's current anticoagulation dose is Warfarin sodium 10 mg tabs: Take 1 tablet  once daily or as directed by coumadin clinic.  The target INR is 2.0-3.0.  The next INR is due 11/08/2010.  Anticoagulation instructions were given to patient.  Results were reviewed/authorized by Teressa Lower, RN.  He was notified by Teressa Lower RN.         Prior Anticoagulation Instructions: INR 2.4 Continue coumadin 10mg  once daily except 5mg  on Wednesdays  Current Anticoagulation Instructions: INR 2.6 TODAY  CONTINUE CURRENT DOSE OF 0.5 TABLET ON WEDNESDAY AND 1 TABLET ALL OTHER DAYS

## 2010-12-01 NOTE — Letter (Signed)
Summary: [place] [facility]  [address], [place] [address]   Phone: [phone]  Fax: [phone]      November 24, 2010 MRN: [id number]   GIOVONNI POIRIER [address] [place], [place]  [address]   Dear Mr. Gaudin,  Your test ordered by Selena Batten has been reviewed by your physician (or physician assistant) and was found to be normal or stable. Your physician (or physician assistant) felt no changes were needed at this time.  ____ Echocardiogram  _x___ Cardiac Stress Test  ____ Lab Work  ____ Peripheral vascular study of arms, legs or neck  ____ CT scan or X-ray  ____ Lung or Breathing test  ____ Other:  No change in medical treatment at this time, per Dr. Dietrich Pates.  Thank you, Aianna Fahs Allyne Gee RN    [place] Bing, MD, Lenise Arena.C.Gaylord Shih, MD, F.A.C.C Lewayne Bunting, MD, F.A.C.C Nona Dell, MD, F.A.C.C Charlton Haws, MD, Lenise Arena.C.C

## 2010-12-09 ENCOUNTER — Encounter (INDEPENDENT_AMBULATORY_CARE_PROVIDER_SITE_OTHER): Payer: Medicare Other

## 2010-12-09 ENCOUNTER — Encounter: Payer: Self-pay | Admitting: Cardiology

## 2010-12-09 DIAGNOSIS — Z7901 Long term (current) use of anticoagulants: Secondary | ICD-10-CM

## 2010-12-09 DIAGNOSIS — I4891 Unspecified atrial fibrillation: Secondary | ICD-10-CM

## 2010-12-15 NOTE — Medication Information (Signed)
Summary: [name]/TR  Anticoagulant Therapy  Managed by: Joshua Hey, RN PCP: Carylon Perches Supervising MD: Diona Browner MD, Remi Deter Indication 1: Atrial Fibrillation Lab Used: [facility] [place] Site: [name] INR POC 2.6  Dietary changes: no    Health status changes: no    Bleeding/hemorrhagic complications: no    Recent/future hospitalizations: no    Any changes in medication regimen? no    Recent/future dental: no  Any missed doses?: no       Is patient compliant with meds? yes       Allergies: No Known Drug Allergies  Anticoagulation Management History:      The patient is taking warfarin and comes in today for a routine follow up visit.  Anticoagulation is being administered due to Atrial Fibrillation .  Positive risk factors for bleeding include an age of 69 years or older.  Negative risk factors for bleeding include no history of CVA/TIA, no history of GI bleeding, and absence of serious comorbidities.  The bleeding index is 'intermediate risk'.  Positive CHADS2 values include History of HTN.  Negative CHADS2 values include History of CHF, Age > 71 years old, History of Diabetes, and Prior Stroke/CVA/TIA.  The start date was 07/30/2010.  Anticoagulation responsible provider: Diona Browner MD, Remi Deter.  INR POC: 2.6.  Cuvette Lot#: [id number].  Exp: 06/2011.    Anticoagulation Management Assessment/Plan:      The patient's current anticoagulation dose is Warfarin sodium 10 mg tabs: Take 1 tablet once daily or as directed by coumadin clinic.  The target INR is 2.0-3.0.  The next INR is due 01/06/2011.  Anticoagulation instructions were given to patient.  Results were reviewed/authorized by Joshua Hey, RN.  He was notified by Joshua Hey RN.         Prior Anticoagulation Instructions: INR 2.2 Continue coumadin 10mg  once daily except 5mg  on Wednesdays  Current Anticoagulation Instructions: INR 2.6 Continue coumadin 10mg  once daily except 5mg  on Wednesdays

## 2011-01-06 ENCOUNTER — Encounter (INDEPENDENT_AMBULATORY_CARE_PROVIDER_SITE_OTHER): Payer: Medicare Other

## 2011-01-06 ENCOUNTER — Encounter: Payer: Self-pay | Admitting: Cardiology

## 2011-01-06 DIAGNOSIS — I4891 Unspecified atrial fibrillation: Secondary | ICD-10-CM

## 2011-01-06 DIAGNOSIS — Z7901 Long term (current) use of anticoagulants: Secondary | ICD-10-CM

## 2011-01-11 ENCOUNTER — Other Ambulatory Visit: Payer: Self-pay | Admitting: Dermatology

## 2011-01-11 NOTE — Medication Information (Signed)
Summary: ccr-lr  Anticoagulant Therapy  Managed by: Vashti Hey, RN PCP: Carylon Perches Supervising MD: Dietrich Pates MD, Molly Maduro Indication 1: Atrial Fibrillation Lab Used: [facility] [place] Site: [place] INR POC 2.5  Dietary changes: no    Health status changes: no    Bleeding/hemorrhagic complications: no    Recent/future hospitalizations: no    Any changes in medication regimen? no    Recent/future dental: no  Any missed doses?: no       Is patient compliant with meds? yes       Allergies: No Known Drug Allergies  Anticoagulation Management History:      The patient is taking warfarin and comes in today for a routine follow up visit.  Anticoagulation is being administered due to Atrial Fibrillation .  Positive risk factors for bleeding include an age of 69 years or older.  Negative risk factors for bleeding include no history of CVA/TIA, no history of GI bleeding, and absence of serious comorbidities.  The bleeding index is 'intermediate risk'.  Positive CHADS2 values include History of HTN.  Negative CHADS2 values include History of CHF, Age > 69 years old, History of Diabetes, and Prior Stroke/CVA/TIA.  The start date was 07/30/2010.  Anticoagulation responsible provider: Dietrich Pates MD, Molly Maduro.  INR POC: 2.5.  Cuvette Lot#: [id number].  Exp: 06/2011.    Anticoagulation Management Assessment/Plan:      The patient's current anticoagulation dose is Warfarin sodium 10 mg tabs: Take 1 tablet once daily or as directed by coumadin clinic.  The target INR is 2.0-3.0.  The next INR is due 02/03/2011.  Anticoagulation instructions were given to patient.  Results were reviewed/authorized by Vashti Hey, RN.  He was notified by Vashti Hey RN.         Prior Anticoagulation Instructions: INR 2.6 Continue coumadin 10mg  once daily except 5mg  on Wednesdays  Current Anticoagulation Instructions: INR 2.5 Continue coumadin 10mg  once daily except 5mg  on Wednesdays

## 2011-01-12 ENCOUNTER — Other Ambulatory Visit: Payer: Self-pay | Admitting: Cardiology

## 2011-01-12 NOTE — Telephone Encounter (Signed)
Patient needs refill on Coumadin called to [facility] pharmacy in [place] / tg

## 2011-01-14 ENCOUNTER — Telehealth: Payer: Self-pay | Admitting: Cardiology

## 2011-01-14 MED ORDER — WARFARIN SODIUM 10 MG PO TABS: 10.0000 mg | ORAL_TABLET | Freq: Every day | ORAL | Status: DC

## 2011-01-14 NOTE — Telephone Encounter (Signed)
WARFIN 10MG  #30 WAS TOLF BY [facility] WE DENIDED IT. IF HE COULD GET MORE THEN 30 DAY WORTH THAT WOULD BE GREAT.

## 2011-02-01 LAB — URINALYSIS, ROUTINE W REFLEX MICROSCOPIC
Bilirubin Urine: NEGATIVE
Glucose, UA: NEGATIVE mg/dL
Ketones, ur: NEGATIVE mg/dL
Leukocytes, UA: NEGATIVE
Nitrite: NEGATIVE
Protein, ur: NEGATIVE mg/dL
pH: 5 (ref 5.0–8.0)

## 2011-02-01 LAB — BASIC METABOLIC PANEL
BUN: 17 mg/dL (ref 6–23)
CO2: 25 mEq/L (ref 19–32)
Calcium: 9.2 mg/dL (ref 8.4–10.5)
Chloride: 111 mEq/L (ref 96–112)
Creatinine, Ser: 0.97 mg/dL (ref 0.4–1.5)
GFR calc Af Amer: >60 mL/min (ref >60)
Glucose, Bld: 116 mg/dL — ABNORMAL HIGH (ref 70–99)

## 2011-02-01 LAB — DIFFERENTIAL
Basophils Absolute: 0 10*3/uL (ref 0.0–0.1)
Basophils Relative: 0 % (ref 0–1)
Eosinophils Absolute: 0.1 10*3/uL (ref 0.0–0.7)
Neutro Abs: 2 10*3/uL (ref 1.7–7.7)
Neutrophils Relative %: 58 % (ref 43–77)

## 2011-02-01 LAB — URINE MICROSCOPIC-ADD ON

## 2011-02-01 LAB — CBC
MCHC: 35.1 g/dL (ref 30.0–36.0)
MCV: 91.3 fL (ref 78.0–100.0)
RDW: 12.8 % (ref 11.5–15.5)

## 2011-02-01 LAB — PROTIME-INR: INR: 1.1 (ref 0.00–1.49)

## 2011-02-02 ENCOUNTER — Encounter: Payer: Self-pay | Admitting: Cardiology

## 2011-02-02 DIAGNOSIS — I4891 Unspecified atrial fibrillation: Secondary | ICD-10-CM

## 2011-02-02 DIAGNOSIS — Z7901 Long term (current) use of anticoagulants: Secondary | ICD-10-CM | POA: Insufficient documentation

## 2011-02-03 ENCOUNTER — Ambulatory Visit (INDEPENDENT_AMBULATORY_CARE_PROVIDER_SITE_OTHER): Payer: Medicare Other | Admitting: *Deleted

## 2011-02-03 DIAGNOSIS — I4891 Unspecified atrial fibrillation: Secondary | ICD-10-CM

## 2011-02-03 DIAGNOSIS — Z7901 Long term (current) use of anticoagulants: Secondary | ICD-10-CM

## 2011-02-03 LAB — POCT INR: INR: 2.2

## 2011-02-08 ENCOUNTER — Other Ambulatory Visit: Payer: Self-pay | Admitting: Cardiology

## 2011-03-03 ENCOUNTER — Encounter: Payer: Medicare Other | Admitting: *Deleted

## 2011-03-08 NOTE — Op Note (Signed)
NAME:  Joshua [name], Joshua [name] NO.:  [id number]   MEDICAL RECORD NO.:  [id number]          PATIENT TYPE:  AMB   LOCATION:  DAY                          FACILITY:  [facility]   PHYSICIAN:  Velora Heckler, MD      DATE OF BIRTH:  [birth year]   DATE OF PROCEDURE:  04/14/2008  DATE OF DISCHARGE:                               OPERATIVE REPORT   PREOPERATIVE DIAGNOSIS:  Ventral hernia.   POSTOPERATIVE DIAGNOSIS:  Incarcerated ventral hernia.   PROCEDURE:  Laparoscopic ventral hernia repair with polyester mesh.   SURGEON:  Velora Heckler, M.D., FACS   ASSISTANT:  None.   ANESTHESIA:  General.   ESTIMATED BLOOD LOSS:  Minimal.   PREPARATION:  Betadine.   COMPLICATIONS:  None.   INDICATIONS:  The patient is 70 year old white male from [place],  [place].  He is referred by Dr. Carylon Perches with small ventral  hernia.  This was noted after a history of blunt trauma.  It causes him  mild discomfort.  He now comes to surgery for repair.   BODY OF REPORT:  Procedure was done in O.R. #1 at the [facility].  The patient is brought to the operating room and  placed in a supine position on the operating room table.  Following  administration of general anesthesia, the patient is prepped and draped  in the usual strict aseptic fashion.  After ascertaining that an  adequate level of anesthesia had been achieved, an incision is made the  left upper quadrant at the costal margin.  Using an OptiVu trocar, the  peritoneal cavity is entered under direct vision and insufflated with  carbon dioxide.  Laparoscope is introduced.  There is an incarcerated  ventral hernia just to the left of midline in the upper abdomen.  It  appears to contain omentum and a portion of the falciform ligament.  Operative ports are placed in the bilateral lower quadrants and in the  right midabdomen.  Using a Glassman clamp, hernia contents are partially  reduced.  Using a harmonic  scalpel, the remainder of the hernia contents  are excised from the subcutaneous tissues and peritoneum.  The omentum  is freed from what appears to be a large adipose mass.  The falciform  ligament is taken down along approximately one-half of its length.  The  hernia contents are then completely excised with the harmonic scalpel.  They are placed into an EndoCatch bag and retrieved through the right  upper quadrant port without difficulty.  They are submitted to pathology  for review.   Next, a 12 cm circular sheet of Parietex polyester mesh is selected.  It  is marked at the four cardinal points and sutured with #1 Novofil  sutures.  Mesh is then dampened with saline and rolled and inserted  through a trocar into the peritoneal cavity, where it is deployed.  Incisions are made on the abdominal wall correlating with the four  sutures.  Using an EndoCatch, the sutures are retrieved and brought  through the abdominal wall full thickness.  All four sutures are  pulled  taut, elevating the mesh over the defect with the least 4-5 cm of  overlap on all sides.  Sutures were tied securely.  Using a titanium  tack device, the mesh is then secured to the abdominal wall with two  concentric circles of titanium tacks.  Mesh appears to have good  coverage of the defect, with good approximation to the anterior  abdominal wall.  Good hemostasis is noted in the peritoneal cavity.  Trocars are removed under direct vision, and good hemostasis is noted at  all trocar sites.  Pneumoperitoneum is released.  All ports are removed.  Port sites are anesthetized with local anesthetic.  Surgical wounds are  closed with stainless steel staples.  Sterile dressings are applied.  The patient is awakened from anesthesia and brought to the recovery room  in stable condition.  The patient tolerated the procedure well.      Velora Heckler, MD  Electronically Signed     TMG/MEDQ  D:  04/14/2008  T:  04/14/2008   Job:  [id number]   cc:   Kingsley Callander. Ouida Sills, MD  Fax: [phone]

## 2011-03-08 NOTE — Op Note (Signed)
NAME:  Joshua [name], Joshua [name] NO.:  [id number]   MEDICAL RECORD NO.:  [id number]          PATIENT TYPE:  AMB   LOCATION:  DAY                          FACILITY:  [facility]   PHYSICIAN:  Velora Heckler, MD      DATE OF BIRTH:  [birth year]   DATE OF PROCEDURE:  03/06/2009  DATE OF DISCHARGE:                               OPERATIVE REPORT   PREOPERATIVE DIAGNOSIS:  Recurrent ventral hernia.   POSTOPERATIVE DIAGNOSIS:  Recurrent ventral hernia.   PROCEDURE:  Laparoscopic repair of ventral hernia with Ethicon Proceed  mesh.   SURGEON:  Velora Heckler, MD, FACS   ANESTHESIA:  General.   ESTIMATED BLOOD LOSS:  Minimal.   PREPARATION:  Betadine.   COMPLICATIONS:  None.   INDICATIONS:  The patient is a 70 year old white male from [place],  [place].  He had undergone laparoscopic ventral hernia repair  with polyester mesh in June 2009.  Shortly after surgery, he developed  discomfort at the umbilicus.  This was just below the level of the prior  repair.  After several months of observation it gradually increased in  size and became progressively more symptomatic.  On physical exam, a  ventral hernia was diagnosed.  The patient now comes to the operating  room for repair.   DESCRIPTION OF PROCEDURE:  The procedure was done in OR #6 at the [facility].  The patient was brought to the operating room  and placed in a supine position on the operating room table.  Following  administration of general anesthesia, the patient was prepped and draped  in the usual strict aseptic fashion.  After ascertaining that an  adequate level of anesthesia had been achieved, a left upper quadrant  incision was made with a #15 blade.  Dissection using an Optiview  trocar, the peritoneal cavity was then entered under direct vision.  The  abdomen was insufflated with carbon dioxide.  The laparoscope was  introduced.  There were significant adhesions of the omentum to  the  previous mesh repair.  Representative photographs were made.  Operative  ports were placed in the left lower quadrant, right lower quadrant, and  right upper quadrant, all with 5 mm trocars.  Using the harmonic  scalpel, adhesions to the mesh were taken down.  The mesh appeared to be  well positioned.  There does not appear to be in the curling of the  edges.  There was not appear to be any defect associated with the mesh.  Again, representative photographs were made.  In the midline after  careful inspection, there does appear to be a small hernia defect above  the level of the umbilicus and below the previous mesh repair.  A 10 x  15 cm sheet of Ethicon Proceed mesh was then selected in order to cover  this defect.  It was placed on the abdominal wall and the borders were  marked appropriately.  5-0 Novofil sutures were placed  circumferentially.  The mesh was rolled and inserted through the 10 mm  trocar into the peritoneal cavity and deployed.  Incisions were made on  the abdominal wall to correspond with the Novofil sutures.  Using an  EndoCatch, the suture tails were retrieved through the abdominal wall.  These were pulled taut allowing the mesh to come in proximity to the  anterior abdominal wall.  Before tying the sutures, the mesh was pulled  away from the side of the abdominal wall to assure that it was covering  the hernia defect and the area of the umbilicus with wide 4 cm overlap  circumferentially.  The sutures were then tied securely.  The mesh was  affixed to the abdominal wall with titanium tacks circumferentially.  Good approximation of the mesh to the abdominal wall was achieved with  wide coverage of the previous hernia defect and overlap with the prior  polyester mesh.  Again, representative photographs were made.  Good  hemostasis was noted.  Omentum was used to cover the small bowel.  Pneumoperitoneum was released and ports were removed.  The port sites  were  anesthetized with local anesthetic.  All wounds were closed with  interrupted 4-0 Monocryl subcuticular sutures.  The wounds were washed  and dried and Benzoin and Steri-Strips were applied.  Sterile dressings  were applied.  An abdominal binder was applied.  The patient was  awakened from anesthesia and brought to the recovery room.  The patient  tolerated the procedure well.      Velora Heckler, MD  Electronically Signed     TMG/MEDQ  D:  03/06/2009  T:  03/06/2009  Job:  [id number]   cc:   Kingsley Callander. Ouida Sills, MD  Fax: [phone]   Barbaraann Share, MD,FCCP  [address]  [place]  [place] [address]

## 2011-03-10 ENCOUNTER — Ambulatory Visit (INDEPENDENT_AMBULATORY_CARE_PROVIDER_SITE_OTHER): Payer: Medicare Other | Admitting: *Deleted

## 2011-03-10 DIAGNOSIS — Z7901 Long term (current) use of anticoagulants: Secondary | ICD-10-CM

## 2011-03-10 DIAGNOSIS — I4891 Unspecified atrial fibrillation: Secondary | ICD-10-CM

## 2011-03-10 LAB — POCT INR: INR: 2.7

## 2011-03-11 NOTE — Op Note (Signed)
NAME:  Joshua [name], Joshua [name]              ACCOUNT NO.:  [id number]   MEDICAL RECORD NO.:  [id number]          PATIENT TYPE:  AMB   LOCATION:  DAY                           FACILITY:  [facility]   PHYSICIAN:  Lionel December, M.D.    DATE OF BIRTH:  [birth year]   DATE OF PROCEDURE:  03/10/2005  DATE OF DISCHARGE:                                 OPERATIVE REPORT   PROCEDURE:  Colonoscopy.   INDICATION:  Joshua [name] is a 70 year old Caucasian male who is here for screening  colonoscopy.  Family history is negative for colorectal carcinoma.   The procedure risks were reviewed the patient, informed consent was  obtained.   PREMEDICATION:  Demerol 50 mg IV, Versed 4 mg IV.   FINDINGS:  Procedure performed in endoscopy suite.  The patient's vital  signs and O2 saturation were monitored during procedure and remained stable.  The patient was placed in the left lateral recumbent position and rectal  examination performed.  No abnormality noted on external or digital exam.  The Olympus video scope was placed in the rectum and advanced under vision  into sigmoid colon and beyond.  Preparation was satisfactory.  The scope was  passed to the cecum, which was identified by ileocecal valve and appendiceal  orifice.  Pictures taken for the record.  As the scope was withdrawn,  colonic mucosa was carefully examined.  There were no polyps and/or tumor  masses.  A single small diverticulum at distal sigmoid colon.  Rectal mucosa  was normal.  The scope was retroflexed to examine anorectal junction, which  was unremarkable.  Endoscope was straightened and withdrawn.  The patient  tolerated the procedure well.   FINAL DIAGNOSIS:  Single small diverticulum at sigmoid colon, otherwise  normal colonoscopy.   RECOMMENDATIONS:  1.  He will resume his atenolol and diet as before.  2.  Yearly Hemoccults.  3.  He may consider next screening exam in 10 years from now.      NR/MEDQ  D:  03/10/2005  T:  03/10/2005  Job:   [id number]   cc:   Kingsley Callander. Ouida Sills, MD  [address]  [place]  [place] [address]  Fax: [phone]

## 2011-04-04 ENCOUNTER — Ambulatory Visit (INDEPENDENT_AMBULATORY_CARE_PROVIDER_SITE_OTHER): Payer: Medicare Other | Admitting: *Deleted

## 2011-04-04 DIAGNOSIS — I4891 Unspecified atrial fibrillation: Secondary | ICD-10-CM

## 2011-04-04 DIAGNOSIS — Z7901 Long term (current) use of anticoagulants: Secondary | ICD-10-CM

## 2011-04-04 LAB — POCT INR: INR: 2.8

## 2011-04-06 ENCOUNTER — Encounter: Payer: Self-pay | Admitting: Cardiology

## 2011-04-07 ENCOUNTER — Encounter: Payer: Medicare Other | Admitting: *Deleted

## 2011-05-06 ENCOUNTER — Ambulatory Visit: Payer: Medicare Other | Admitting: Cardiology

## 2011-05-06 ENCOUNTER — Ambulatory Visit (INDEPENDENT_AMBULATORY_CARE_PROVIDER_SITE_OTHER): Payer: Medicare Other | Admitting: *Deleted

## 2011-05-06 DIAGNOSIS — Z7901 Long term (current) use of anticoagulants: Secondary | ICD-10-CM

## 2011-05-06 DIAGNOSIS — I4891 Unspecified atrial fibrillation: Secondary | ICD-10-CM

## 2011-05-25 ENCOUNTER — Ambulatory Visit (INDEPENDENT_AMBULATORY_CARE_PROVIDER_SITE_OTHER): Payer: Medicare Other | Admitting: Cardiology

## 2011-05-25 ENCOUNTER — Ambulatory Visit (INDEPENDENT_AMBULATORY_CARE_PROVIDER_SITE_OTHER): Payer: Medicare Other | Admitting: *Deleted

## 2011-05-25 ENCOUNTER — Encounter: Payer: Self-pay | Admitting: Cardiology

## 2011-05-25 DIAGNOSIS — G4730 Sleep apnea, unspecified: Secondary | ICD-10-CM

## 2011-05-25 DIAGNOSIS — I10 Essential (primary) hypertension: Secondary | ICD-10-CM

## 2011-05-25 DIAGNOSIS — Z7901 Long term (current) use of anticoagulants: Secondary | ICD-10-CM

## 2011-05-25 DIAGNOSIS — M1990 Unspecified osteoarthritis, unspecified site: Secondary | ICD-10-CM | POA: Insufficient documentation

## 2011-05-25 DIAGNOSIS — I4891 Unspecified atrial fibrillation: Secondary | ICD-10-CM

## 2011-05-25 DIAGNOSIS — E785 Hyperlipidemia, unspecified: Secondary | ICD-10-CM

## 2011-05-25 DIAGNOSIS — R609 Edema, unspecified: Secondary | ICD-10-CM

## 2011-05-25 LAB — POCT INR: INR: 3

## 2011-05-25 MED ORDER — ATENOLOL 50 MG PO TABS: 75.0000 mg | ORAL_TABLET | Freq: Two times a day (BID) | ORAL | Status: DC

## 2011-05-25 MED ORDER — WARFARIN SODIUM 10 MG PO TABS: 10.0000 mg | ORAL_TABLET | Freq: Every day | ORAL | Status: DC

## 2011-05-25 NOTE — Assessment & Plan Note (Signed)
Lipid profile is somewhat suboptimal, but the patient has no indication for treatment with statins.  Maintenance of a heart healthy diet is advised.

## 2011-05-25 NOTE — Assessment & Plan Note (Signed)
Diltiazem may be contributing to peripheral edema and will be discontinued.  Metoprolol will be increased to 75 mg b.id to maintain control of heart rate.  Leg elevation and a low-salt diet were reinforced.  A nursing visit is planned in 2 weeks to reassess heart rate control.

## 2011-05-25 NOTE — Progress Notes (Signed)
HPI : Joshua [name] returns to the office as scheduled for continued assessment and treatment of atrial fibrillation.  Since last visit, he has done quite well.  He denies dyspnea, orthopnea, palpitations, lightheadedness or syncope.  He has developed pedal edema that is somewhat troublesome to him.  He denies consuming excessive amounts of salt and has maintained leg elevation to some extent.  He has no history of venous disease or other problems in the extremities.  Current Outpatient Prescriptions on File Prior to Visit  Medication Sig Dispense Refill  . lisinopril (PRINIVIL,ZESTRIL) 40 MG tablet Take 40 mg by mouth 2 (two) times daily. Take 1/2 tab       . niacin 500 MG CR capsule Take 500 mg by mouth daily.        . Omega-3 Fatty Acids (FISH OIL) 1000 MG CAPS Take by mouth daily.        Marland Kitchen DISCONTD: warfarin (COUMADIN) 10 MG tablet Take 1 tablet (10 mg total) by mouth daily.  60 tablet  1     No Known Allergies    Past medical history, social history, and family history reviewed and updated.  ROS: See history of present illness.  PHYSICAL EXAM: BP 129/77  Pulse 68  Ht 5\' 11"  (1.803 m)  Wt 103.42 kg (228 lb)  BMI 31.80 kg/m2  SpO2 96%  General-Well developed; no acute distress Body habitus-overweight Neck-No JVD; no carotid bruits Lungs-clear lung fields; resonant to percussion Cardiovascular-normal PMI; split S1 and normal S2 Abdomen-normal bowel sounds; soft and non-tender without masses or organomegaly Musculoskeletal-No deformities, no cyanosis or clubbing Neurologic-Normal cranial nerves; symmetric strength and tone Skin-Warm, no significant lesions Extremities-distal pulses intact; 1-2+ pitting ankle and pretibial edema  EKG: (Rhythm Strip) Atrial fibrillation with a controlled ventricular response; heart rate of 80 bpm  ASSESSMENT AND PLAN:

## 2011-05-25 NOTE — Patient Instructions (Addendum)
Your physician recommends that you schedule a follow-up appointment in:9 months LOW SALT DIET-handout LEG ELEVATION Your physician has recommended you make the following change in your medication: stop diltiazem, increase atenolol to 75mg  twice daily NURSE VISIT WITH A RHYTHM STRIP IN 2 WEEKS Your physician recommends that you return for lab work in: TODAY STOOL CARDS- FOLLOW DIRECTIONS IN PACKET

## 2011-05-25 NOTE — Assessment & Plan Note (Signed)
Blood pressure control is excellent, but diltiazem will be withdrawn in an attempt to improve peripheral edema.  Atenolol dosage will be increased.  In the setting of medication changes, control of hypertension will be monitored.

## 2011-05-25 NOTE — Assessment & Plan Note (Signed)
Heart rate control is excellent, and patient appears to be asymptomatic.  We will continue the strategy of heart rate control and anticoagulation.

## 2011-05-25 NOTE — Assessment & Plan Note (Signed)
Anticoagulation has been stable and therapeutic.  Stool for Hemoccult testing and a CBC will be obtained to exclude occult GI blood loss.

## 2011-05-26 ENCOUNTER — Encounter: Payer: Self-pay | Admitting: Cardiology

## 2011-05-26 DIAGNOSIS — D696 Thrombocytopenia, unspecified: Secondary | ICD-10-CM | POA: Insufficient documentation

## 2011-05-26 LAB — CBC WITH DIFFERENTIAL/PLATELET
Basophils Absolute: 0 10*3/uL (ref 0.0–0.1)
Eosinophils Relative: 1 % (ref 0–5)
Lymphocytes Relative: 37 % (ref 12–46)
Lymphs Abs: 2.2 10*3/uL (ref 0.7–4.0)
MCV: 94.4 fL (ref 78.0–100.0)
Neutro Abs: 3.2 10*3/uL (ref 1.7–7.7)
Neutrophils Relative %: 54 % (ref 43–77)
Platelets: 141 10*3/uL — ABNORMAL LOW (ref 150–400)
RBC: 4.84 MIL/uL (ref 4.22–5.81)
RDW: 13.5 % (ref 11.5–15.5)
WBC: 5.9 10*3/uL (ref 4.0–10.5)

## 2011-05-26 LAB — COMPREHENSIVE METABOLIC PANEL
ALT: 19 U/L (ref 0–53)
Albumin: 4.3 g/dL (ref 3.5–5.2)
CO2: 25 mEq/L (ref 19–32)
Calcium: 9.5 mg/dL (ref 8.4–10.5)
Chloride: 110 mEq/L (ref 96–112)
Glucose, Bld: 81 mg/dL (ref 70–99)
Sodium: 145 mEq/L (ref 135–145)
Total Bilirubin: 0.9 mg/dL (ref 0.3–1.2)
Total Protein: 7.1 g/dL (ref 6.0–8.3)

## 2011-06-02 ENCOUNTER — Ambulatory Visit (INDEPENDENT_AMBULATORY_CARE_PROVIDER_SITE_OTHER): Payer: Medicare Other

## 2011-06-02 VITALS — BP 117/86 | HR 85 | Ht 71.0 in | Wt 235.0 lb

## 2011-06-02 DIAGNOSIS — I4891 Unspecified atrial fibrillation: Secondary | ICD-10-CM

## 2011-06-02 NOTE — Progress Notes (Signed)
S: Pt. arrives in office for a rhythm strip and 2 week nurse visit to check HR. B: On last OV with Dr. Dietrich Pates on 05-25-11 pt. was advised to stop taking Diltiazem due to pedal edema, increase Atenolol to 75 mg bid and to come to today's nurse visit. A: Pt. has no complaints at this time and is happy to report, and wants Dr. Dietrich Pates to know, that the swelling in his feet and ankles has gone away. His HR today is 85 and on last OV HR was 68. Rhythm strip obtained and placed in Dr. Marvel Plan reports folder for his review. R: Pt. advised to continue current medical treatment and that we will contact him with Dr. Marvel Plan recommendations if any.

## 2011-06-03 ENCOUNTER — Other Ambulatory Visit: Payer: Self-pay | Admitting: *Deleted

## 2011-06-03 DIAGNOSIS — I4891 Unspecified atrial fibrillation: Secondary | ICD-10-CM

## 2011-06-11 NOTE — Progress Notes (Signed)
Rhythm Strip: Atrial fibrillation with uncontrolled ventricular rate of 125 bpm.

## 2011-06-20 ENCOUNTER — Ambulatory Visit (INDEPENDENT_AMBULATORY_CARE_PROVIDER_SITE_OTHER): Payer: Medicare Other | Admitting: *Deleted

## 2011-06-20 DIAGNOSIS — Z7901 Long term (current) use of anticoagulants: Secondary | ICD-10-CM

## 2011-06-20 DIAGNOSIS — I4891 Unspecified atrial fibrillation: Secondary | ICD-10-CM

## 2011-06-23 ENCOUNTER — Encounter: Payer: Medicare Other | Admitting: *Deleted

## 2011-07-01 ENCOUNTER — Telehealth: Payer: Self-pay | Admitting: Cardiology

## 2011-07-01 NOTE — Telephone Encounter (Signed)
Dr. Ouida Sills since an EKG dated 06/20/11 showed atrial fibrillation with a controlled ventricular response. His notation indicates that patient is back on diltiazem; he discontinued diltiazem and started digoxin 0.25 mg per day  Verify current medications with patient and update his medication list Have patient come to the office and obtain a rhythm strip at no charge Interpretation that this was done and returned this note and rhythm strip to me.

## 2011-07-04 ENCOUNTER — Encounter: Payer: Self-pay | Admitting: Cardiology

## 2011-07-05 ENCOUNTER — Other Ambulatory Visit: Payer: Self-pay

## 2011-07-05 MED ORDER — LISINOPRIL 20 MG PO TABS: 20.0000 mg | ORAL_TABLET | Freq: Two times a day (BID) | ORAL | Status: DC

## 2011-07-05 MED ORDER — DIGOXIN 250 MCG PO TABS: 250.0000 ug | ORAL_TABLET | Freq: Every day | ORAL | Status: DC

## 2011-07-05 NOTE — Telephone Encounter (Signed)
Pt. arrives in office for a rhythm strip and medications update per Dr. Dietrich Pates.  Pt. brought his medications with him to this visit and states he is taking as directed (see updated list in chart). Rhythm strip placed in Dr. Marvel Plan report folder for his review. Pt. has no complaints at this time and states edema has improved since stopping Diltiazem./LV

## 2011-07-06 NOTE — Telephone Encounter (Signed)
Continue current medication.

## 2011-07-12 ENCOUNTER — Encounter: Payer: Self-pay | Admitting: Cardiology

## 2011-07-21 ENCOUNTER — Ambulatory Visit (INDEPENDENT_AMBULATORY_CARE_PROVIDER_SITE_OTHER): Payer: Medicare Other | Admitting: *Deleted

## 2011-07-21 DIAGNOSIS — I4891 Unspecified atrial fibrillation: Secondary | ICD-10-CM

## 2011-07-21 DIAGNOSIS — Z7901 Long term (current) use of anticoagulants: Secondary | ICD-10-CM

## 2011-07-21 LAB — DIFFERENTIAL
Basophils Absolute: 0
Basophils Relative: 1
Lymphocytes Relative: 40
Monocytes Absolute: 0.2
Neutro Abs: 1.9
Neutrophils Relative %: 51

## 2011-07-21 LAB — URINALYSIS, ROUTINE W REFLEX MICROSCOPIC
Bilirubin Urine: NEGATIVE
Glucose, UA: NEGATIVE
Hgb urine dipstick: NEGATIVE
Specific Gravity, Urine: 1.024
Urobilinogen, UA: 0.2
pH: 5.5

## 2011-07-21 LAB — BASIC METABOLIC PANEL
CO2: 27
Calcium: 9.3
Chloride: 111
GFR calc Af Amer: >60
Glucose, Bld: 99
Potassium: 4.3
Sodium: 144

## 2011-07-21 LAB — CBC
Hemoglobin: 14.2
MCHC: 34.5
MCV: 88.7
Platelets: 102 — ABNORMAL LOW
RBC: 4.51
RBC: 4.65
RDW: 13.1
WBC: 3.4 — ABNORMAL LOW

## 2011-07-21 LAB — PROTIME-INR: INR: 1

## 2011-07-21 LAB — POCT INR: INR: 2.1

## 2011-07-26 ENCOUNTER — Encounter: Payer: Self-pay | Admitting: Cardiology

## 2011-07-28 ENCOUNTER — Encounter: Payer: Self-pay | Admitting: Cardiology

## 2011-08-11 ENCOUNTER — Telehealth: Payer: Self-pay | Admitting: *Deleted

## 2011-08-11 NOTE — Telephone Encounter (Signed)
Pt states he have them to staff.  States he can do them again and will pick them up this week.

## 2011-08-11 NOTE — Telephone Encounter (Signed)
Message copied by Gaynelle Adu on Thu Aug 11, 2011  4:27 PM ------      Message from: Kathlen Brunswick      Created: Mon May 30, 2011  7:43 AM                   ----- Message -----         From: SYSTEM         Sent: 05/30/2011  12:00 AM           To: Gerrit Friends. Dietrich Pates, MD

## 2011-08-18 ENCOUNTER — Ambulatory Visit (INDEPENDENT_AMBULATORY_CARE_PROVIDER_SITE_OTHER): Payer: Medicare Other | Admitting: *Deleted

## 2011-08-18 DIAGNOSIS — I4891 Unspecified atrial fibrillation: Secondary | ICD-10-CM

## 2011-08-18 DIAGNOSIS — Z7901 Long term (current) use of anticoagulants: Secondary | ICD-10-CM

## 2011-08-18 LAB — POCT INR: INR: 3

## 2011-09-29 ENCOUNTER — Ambulatory Visit (INDEPENDENT_AMBULATORY_CARE_PROVIDER_SITE_OTHER): Payer: Medicare Other | Admitting: *Deleted

## 2011-09-29 DIAGNOSIS — Z7901 Long term (current) use of anticoagulants: Secondary | ICD-10-CM

## 2011-09-29 DIAGNOSIS — I4891 Unspecified atrial fibrillation: Secondary | ICD-10-CM

## 2011-09-29 LAB — POCT INR: INR: 2.5

## 2011-11-07 ENCOUNTER — Ambulatory Visit (INDEPENDENT_AMBULATORY_CARE_PROVIDER_SITE_OTHER): Payer: Medicare Other | Admitting: *Deleted

## 2011-11-07 DIAGNOSIS — I4891 Unspecified atrial fibrillation: Secondary | ICD-10-CM | POA: Diagnosis not present

## 2011-11-07 DIAGNOSIS — Z7901 Long term (current) use of anticoagulants: Secondary | ICD-10-CM | POA: Diagnosis not present

## 2011-11-07 LAB — POCT INR: INR: 2.9

## 2011-11-10 ENCOUNTER — Encounter: Payer: Medicare Other | Admitting: *Deleted

## 2011-11-30 DIAGNOSIS — H1130 Conjunctival hemorrhage, unspecified eye: Secondary | ICD-10-CM | POA: Diagnosis not present

## 2011-12-22 ENCOUNTER — Ambulatory Visit (INDEPENDENT_AMBULATORY_CARE_PROVIDER_SITE_OTHER): Payer: Medicare Other | Admitting: *Deleted

## 2011-12-22 DIAGNOSIS — Z7901 Long term (current) use of anticoagulants: Secondary | ICD-10-CM

## 2011-12-22 DIAGNOSIS — I4891 Unspecified atrial fibrillation: Secondary | ICD-10-CM

## 2011-12-22 LAB — POCT INR: INR: 3.3

## 2012-01-04 ENCOUNTER — Other Ambulatory Visit: Payer: Self-pay | Admitting: Dermatology

## 2012-01-04 DIAGNOSIS — D235 Other benign neoplasm of skin of trunk: Secondary | ICD-10-CM | POA: Diagnosis not present

## 2012-01-04 DIAGNOSIS — C4330 Malignant melanoma of unspecified part of face: Secondary | ICD-10-CM | POA: Diagnosis not present

## 2012-01-04 DIAGNOSIS — D485 Neoplasm of uncertain behavior of skin: Secondary | ICD-10-CM | POA: Diagnosis not present

## 2012-01-04 DIAGNOSIS — Z85820 Personal history of malignant melanoma of skin: Secondary | ICD-10-CM | POA: Diagnosis not present

## 2012-01-25 ENCOUNTER — Ambulatory Visit (INDEPENDENT_AMBULATORY_CARE_PROVIDER_SITE_OTHER): Payer: Medicare Other | Admitting: *Deleted

## 2012-01-25 DIAGNOSIS — I4891 Unspecified atrial fibrillation: Secondary | ICD-10-CM

## 2012-01-25 DIAGNOSIS — Z7901 Long term (current) use of anticoagulants: Secondary | ICD-10-CM

## 2012-02-06 DIAGNOSIS — C4330 Malignant melanoma of unspecified part of face: Secondary | ICD-10-CM | POA: Diagnosis not present

## 2012-02-06 DIAGNOSIS — C4499 Other specified malignant neoplasm of skin, unspecified: Secondary | ICD-10-CM | POA: Diagnosis not present

## 2012-02-06 DIAGNOSIS — C4360 Malignant melanoma of unspecified upper limb, including shoulder: Secondary | ICD-10-CM | POA: Diagnosis not present

## 2012-02-06 DIAGNOSIS — R238 Other skin changes: Secondary | ICD-10-CM | POA: Diagnosis not present

## 2012-03-08 ENCOUNTER — Ambulatory Visit (INDEPENDENT_AMBULATORY_CARE_PROVIDER_SITE_OTHER): Payer: Medicare Other | Admitting: *Deleted

## 2012-03-08 DIAGNOSIS — I4891 Unspecified atrial fibrillation: Secondary | ICD-10-CM | POA: Diagnosis not present

## 2012-03-08 DIAGNOSIS — Z7901 Long term (current) use of anticoagulants: Secondary | ICD-10-CM

## 2012-04-16 ENCOUNTER — Ambulatory Visit (INDEPENDENT_AMBULATORY_CARE_PROVIDER_SITE_OTHER): Payer: Medicare Other | Admitting: *Deleted

## 2012-04-16 DIAGNOSIS — Z7901 Long term (current) use of anticoagulants: Secondary | ICD-10-CM | POA: Diagnosis not present

## 2012-04-16 DIAGNOSIS — I4891 Unspecified atrial fibrillation: Secondary | ICD-10-CM

## 2012-05-31 ENCOUNTER — Ambulatory Visit (INDEPENDENT_AMBULATORY_CARE_PROVIDER_SITE_OTHER): Payer: Medicare Other | Admitting: *Deleted

## 2012-05-31 DIAGNOSIS — Z7901 Long term (current) use of anticoagulants: Secondary | ICD-10-CM

## 2012-05-31 DIAGNOSIS — I4891 Unspecified atrial fibrillation: Secondary | ICD-10-CM

## 2012-06-11 DIAGNOSIS — I10 Essential (primary) hypertension: Secondary | ICD-10-CM | POA: Diagnosis not present

## 2012-06-11 DIAGNOSIS — I4891 Unspecified atrial fibrillation: Secondary | ICD-10-CM | POA: Diagnosis not present

## 2012-07-02 ENCOUNTER — Other Ambulatory Visit: Payer: Self-pay | Admitting: Cardiology

## 2012-07-09 DIAGNOSIS — Z79899 Other long term (current) drug therapy: Secondary | ICD-10-CM | POA: Diagnosis not present

## 2012-07-09 DIAGNOSIS — E785 Hyperlipidemia, unspecified: Secondary | ICD-10-CM | POA: Diagnosis not present

## 2012-07-09 DIAGNOSIS — I10 Essential (primary) hypertension: Secondary | ICD-10-CM | POA: Diagnosis not present

## 2012-07-09 DIAGNOSIS — I4891 Unspecified atrial fibrillation: Secondary | ICD-10-CM | POA: Diagnosis not present

## 2012-07-12 ENCOUNTER — Ambulatory Visit (INDEPENDENT_AMBULATORY_CARE_PROVIDER_SITE_OTHER): Payer: Medicare Other | Admitting: *Deleted

## 2012-07-12 DIAGNOSIS — Z7901 Long term (current) use of anticoagulants: Secondary | ICD-10-CM | POA: Diagnosis not present

## 2012-07-12 DIAGNOSIS — I4891 Unspecified atrial fibrillation: Secondary | ICD-10-CM

## 2012-07-16 DIAGNOSIS — E785 Hyperlipidemia, unspecified: Secondary | ICD-10-CM | POA: Diagnosis not present

## 2012-07-16 DIAGNOSIS — I10 Essential (primary) hypertension: Secondary | ICD-10-CM | POA: Diagnosis not present

## 2012-07-24 DIAGNOSIS — M1710 Unilateral primary osteoarthritis, unspecified knee: Secondary | ICD-10-CM | POA: Diagnosis not present

## 2012-07-24 DIAGNOSIS — D485 Neoplasm of uncertain behavior of skin: Secondary | ICD-10-CM | POA: Diagnosis not present

## 2012-08-07 DIAGNOSIS — H2510 Age-related nuclear cataract, unspecified eye: Secondary | ICD-10-CM | POA: Diagnosis not present

## 2012-08-09 ENCOUNTER — Ambulatory Visit (HOSPITAL_COMMUNITY): Payer: Medicare Other | Admitting: Physical Therapy

## 2012-08-20 ENCOUNTER — Ambulatory Visit (HOSPITAL_COMMUNITY)
Admission: RE | Admit: 2012-08-20 | Discharge: 2012-08-20 | Disposition: A | Discharge status: Home / Self Care | Payer: Medicare Other | Source: Ambulatory Visit | Attending: Orthopedic Surgery | Admitting: Orthopedic Surgery

## 2012-08-20 DIAGNOSIS — M6281 Muscle weakness (generalized): Secondary | ICD-10-CM | POA: Insufficient documentation

## 2012-08-20 DIAGNOSIS — R262 Difficulty in walking, not elsewhere classified: Secondary | ICD-10-CM | POA: Diagnosis not present

## 2012-08-20 DIAGNOSIS — I10 Essential (primary) hypertension: Secondary | ICD-10-CM | POA: Insufficient documentation

## 2012-08-20 DIAGNOSIS — IMO0001 Reserved for inherently not codable concepts without codable children: Secondary | ICD-10-CM | POA: Insufficient documentation

## 2012-08-20 DIAGNOSIS — M25569 Pain in unspecified knee: Secondary | ICD-10-CM | POA: Diagnosis not present

## 2012-08-20 DIAGNOSIS — R29898 Other symptoms and signs involving the musculoskeletal system: Secondary | ICD-10-CM | POA: Insufficient documentation

## 2012-08-20 NOTE — Evaluation (Cosign Needed)
Physical Therapy Evaluation  Patient Details  Name: Joshua [name] MRN: [id number] Date of Birth: 1941/07/28  Today's Date: 08/20/2012 Time: 4098-1191 PT Time Calculation (min): 48 min  Visit#: 1  of 8   Re-eval: 09/19/12 Assessment Diagnosis: B OA Next MD Visit: 08/27/12 Prior Therapy: none  Authorization: medicare  Authorization Time Period:    Authorization Visit#: 1  of 8    Past Medical History:  Past Medical History  Diagnosis Date  . Atrial fibrillation 06/2010  . Hypertension   . Hyperlipidemia   . Sleep apnea     Rx-CPAP  . Degenerative joint disease     Right knee  . Chronic anticoagulation    Past Surgical History:  Past Surgical History  Procedure Date  . Ventral hernia repair 03/2008, 02/2009  . Colonoscopy 03/10/2005    Subjective Symptoms/Limitations Symptoms: Joshua [name] states that his knees have been bothering him for years.  The pain is equal on his right and left.  He states that  the cold weather makes them hurt more.  He states he use to walk but the pain became so bad that he had to quit walking.  The pt states that going up and down steps and going from sit to stand is painful but sometimes he has pain when he is just sitting as well.  The pt states about once a week his knees will wake him up at night.  He is being referred to therapy to attempt to improve his strength and return him to his prior functional level. How long can you sit comfortably?: no problem How long can you stand comfortably?: 30 minutes. How long can you walk comfortably?: 10 minutes Pain Assessment Currently in Pain?: No/denies (worst 5/10; )    Prior Functioning  Home Living Lives With: Spouse Type of Home: House Home Access: Stairs to enter Secretary/administrator of Steps: 4 Home Layout: Two level Prior Function Vocation: Part time employment Vocation Requirements: driving  Leisure: Hobbies-yes (Comment) Comments: hunting and fishing.      Sensation/Coordination/Flexibility/Functional Tests Flexibility 90/90: Positive  Assessment RLE AROM (degrees) Right Knee Extension: 0  Right Knee Flexion: 125  RLE Strength Right Hip Flexion: 5/5 Right Hip Extension: 3/5 Right Hip ABduction: 4/5 Right Hip ADduction: 4/5 Right Knee Flexion: 5/5 Right Knee Extension: 3/5 Right Ankle Dorsiflexion: 4/5 LLE AROM (degrees) Left Knee Extension: 0  Left Knee Flexion: 125  LLE Strength Left Hip Flexion: 4/5 Left Hip Extension: 4/5 Left Hip ABduction: 4/5 Left Hip ADduction: 4/5 Left Knee Flexion: 5/5 Left Knee Extension: 4/5 Left Ankle Dorsiflexion: 4/5  The pt tends to ambulate flat footed. Exercise/Treatments     Stretches Active Hamstring Stretch: 3 reps;30 seconds Seated Long Arc Quad: Both;10 reps Supine Quad Sets: Strengthening;Both;10 reps Sidelying Hip ABduction: Both;10 reps Prone  Hip Extension: Both;10 reps      Physical Therapy Assessment and Plan PT Assessment and Plan Clinical Impression Statement: Pt with B OA with significant quad weakness who will benefit from skilled PT to improve mm strength and functiona ability.  Pt will benefit from skilled therapeutic intervention in order to improve on the following deficits: Decreased activity tolerance;Decreased strength;Difficulty walking;Pain Rehab Potential: Good PT Frequency: Min 2X/week PT Duration: 4 weeks PT Treatment/Interventions: Therapeutic exercise;Balance training;Therapeutic activities;Gait training PT Plan: Work on heel toe gt; heel raises, rockerboard, terminal extension;  3rd treatment add lateral and forward step ups    Goals Home Exercise Program Pt will Perform Home Exercise Program: Independently PT Short Term  Goals Time to Complete Short Term Goals: 2 weeks PT Short Term Goal 1: increase strength by 1/2 grade. PT Short Term Goal 2: sit to stand 20% easier PT Short Term Goal 3: able to walk for 40 minutes without difficulty PT  Long Term Goals Time to Complete Long Term Goals: 4 weeks PT Long Term Goal 1: I in advance HEP PT Long Term Goal 2: pain no greater than a 3 Long Term Goal 3: strength improved 1 grade Long Term Goal 4: LEFS improved 10 points. PT Long Term Goal 5: able to stand or walk for 60 minutes. Additional PT Long Term Goals?: Yes PT Long Term Goal 6: Pt to state sit to stand is at least 50% easier. PT Long Term Goal 7: able to descend steps without pain  Problem List Patient Active Problem List  Diagnosis  . HYPERLIPIDEMIA  . HYPERTENSION  . Atrial fibrillation  . PEDAL EDEMA  . Chronic anticoagulation  . Sleep apnea  . Degenerative joint disease  . Thrombocytopenia  . Difficulty in walking  . Weakness of both legs    PT - End of Session Activity Tolerance: Patient tolerated treatment well General Behavior During Session: [facility] for tasks performed  GP Functional Assessment Tool Used: LEFS Functional Limitation: Mobility: Walking and moving around Mobility: Walking and Moving Around Current Status (N8295): At least 60 percent but less than 80 percent impaired, limited or restricted Mobility: Walking and Moving Around Goal Status [phone]): At least 20 percent but less than 40 percent impaired, limited or restricted  RUSSELL,CINDY 08/20/2012, 5:17 PM  Physician Documentation Your signature is required to indicate approval of the treatment plan as stated above.  Please sign and either send electronically or make a copy of this report for your files and return this physician signed original.   Please mark one 1.__approve of plan  2. ___approve of plan with the following conditions.   ______________________________                                                          _____________________ Physician Signature                                                                                                             Date

## 2012-08-22 ENCOUNTER — Ambulatory Visit (INDEPENDENT_AMBULATORY_CARE_PROVIDER_SITE_OTHER): Payer: Medicare Other | Admitting: *Deleted

## 2012-08-22 DIAGNOSIS — I4891 Unspecified atrial fibrillation: Secondary | ICD-10-CM | POA: Diagnosis not present

## 2012-08-22 DIAGNOSIS — Z7901 Long term (current) use of anticoagulants: Secondary | ICD-10-CM | POA: Diagnosis not present

## 2012-08-22 LAB — POCT INR: INR: 2.2

## 2012-08-24 ENCOUNTER — Ambulatory Visit (HOSPITAL_COMMUNITY)
Admission: RE | Admit: 2012-08-24 | Discharge: 2012-08-24 | Disposition: A | Discharge status: Home / Self Care | Payer: Medicare Other | Source: Ambulatory Visit | Attending: Orthopedic Surgery | Admitting: Orthopedic Surgery

## 2012-08-24 ENCOUNTER — Other Ambulatory Visit: Payer: Self-pay | Admitting: Cardiology

## 2012-08-24 DIAGNOSIS — IMO0001 Reserved for inherently not codable concepts without codable children: Secondary | ICD-10-CM | POA: Diagnosis not present

## 2012-08-24 DIAGNOSIS — I10 Essential (primary) hypertension: Secondary | ICD-10-CM | POA: Diagnosis not present

## 2012-08-24 DIAGNOSIS — M6281 Muscle weakness (generalized): Secondary | ICD-10-CM | POA: Insufficient documentation

## 2012-08-24 DIAGNOSIS — M25569 Pain in unspecified knee: Secondary | ICD-10-CM | POA: Insufficient documentation

## 2012-08-24 DIAGNOSIS — R262 Difficulty in walking, not elsewhere classified: Secondary | ICD-10-CM | POA: Diagnosis not present

## 2012-08-24 DIAGNOSIS — R29898 Other symptoms and signs involving the musculoskeletal system: Secondary | ICD-10-CM

## 2012-08-24 NOTE — Progress Notes (Signed)
Physical Therapy Treatment Patient Details  Name: Joshua [name] MRN: [id number] Date of Birth: [birth year]  Today's Date: 08/24/2012 Time: 2440-1027 PT Time Calculation (min): 58 min  Visit#: 2  of 8   Re-eval: 09/19/12  Charge: gait 10', therex 38', ice 10'  Authorization: Medicare  Authorization Visit#: 2  of 8    Subjective: Symptoms/Limitations Symptoms: Pt stated R posterior knee pain scale 3-4/10.  Pt compliant with HEP with no questions about technique for exercises. Pain Assessment Currently in Pain?: Yes Pain Score:   4 Pain Location: Knee Pain Orientation: Right;Posterior  Objective:   Exercise/Treatments Stretches Active Hamstring Stretch: 3 reps;30 seconds Quad Stretch: 2 reps;30 seconds Standing Heel Raises: 20 reps;Limitations Heel Raises Limitations: 20 toe raises Terminal Knee Extension: Right;Left;10 reps;Theraband Theraband Level (Terminal Knee Extension): Level 4 (Blue) Rocker Board: 3 minutes;Limitations Rocker Board Limitations: R/L with 2 fingers Gait Training: gait training heel to toe and equal stance phase /stride length x 10' Supine Quad Sets: Strengthening;Both;10 reps Short Arc Quad Sets: Both;10 reps;Limitations Short Arc Quad Sets Limitations: 2# Bridges: 10 reps Straight Leg Raises: Both;10 reps;Limitations Straight Leg Raises Limitations: 2# Sidelying Hip ABduction: Both;10 reps Hip ADduction: Both;10 reps Prone  Hip Extension: Both;10 reps   Modalities Modalities: Cryotherapy Cryotherapy Number Minutes Cryotherapy: 10 Minutes Cryotherapy Location: Knee (right knee) Type of Cryotherapy: Ice pack  Physical Therapy Assessment and Plan PT Assessment and Plan Clinical Impression Statement: Session focus on improving gait mechanics with cueing fot heel to toe gait and equal stance phase/stride length and education on importance of TKE with gait.  Pt able to demonstrate appropraite technique with all exercises with min cueing for  form.  Noted increase quad fatigue with SAQ R LE with increased reps. PT Plan: Continue with current POC for improved gait mechanics and LE strengthening.  Begin lateral and forward step ups next session.    Goals    Problem List Patient Active Problem List  Diagnosis  . HYPERLIPIDEMIA  . HYPERTENSION  . Atrial fibrillation  . PEDAL EDEMA  . Chronic anticoagulation  . Sleep apnea  . Degenerative joint disease  . Thrombocytopenia  . Difficulty in walking  . Weakness of both legs    PT - End of Session Activity Tolerance: Patient tolerated treatment well General Behavior During Session: [facility] for tasks performed Cognition: [facility] for tasks performed  GP    Joshua [name] 08/24/2012, 6:28 PM

## 2012-08-27 ENCOUNTER — Ambulatory Visit (HOSPITAL_COMMUNITY)
Admission: RE | Admit: 2012-08-27 | Discharge: 2012-08-27 | Disposition: A | Discharge status: Home / Self Care | Payer: Medicare Other | Source: Ambulatory Visit

## 2012-08-27 NOTE — Progress Notes (Signed)
Physical Therapy Treatment Patient Details  Name: Joshua [name] MRN: [id number] Date of Birth: 10-24-1941  Today's Date: 08/27/2012 Time: 7829-5621 PT Time Calculation (min): 42 min Charges:  therex 40 Visit#: 3  of 8   Re-eval: 09/19/12 Authorization: Medicare  Authorization Visit#: 3  of 8    Subjective: Symptoms/Limitations Symptoms: Pt. states he can tell his legs are stronger.  Reports compliance with HEP.  Pain is the same and actually L is hurting worse than R today. Pain Assessment Currently in Pain?: Yes Pain Score:   4 Pain Location: Knee Pain Orientation: Right;Left   Exercise/Treatments Stretches Active Hamstring Stretch: 3 reps;30 seconds Quad Stretch: 3 reps;30 seconds Standing Heel Raises: 20 reps;Limitations Heel Raises Limitations: 20 toe raises Terminal Knee Extension: Right;Left;10 reps;Theraband Theraband Level (Terminal Knee Extension): Level 4 (Blue) Lateral Step Up: 10 reps;Both;Hand Hold: 2 Forward Step Up: 10 reps Rocker Board: 3 minutes Rocker Board Limitations: R/L with 2 fingers Seated Long Arc Quad: 15 reps;Both Supine Short Arc Quad Sets: 15 reps;Both Short Arc [employer] Limitations: 2# Bridges: 15 reps Straight Leg Raises: 15 reps;Both Straight Leg Raises Limitations: 2# Sidelying Hip ABduction: 15 reps;Both Hip ADduction: 15 reps;Both Prone  Hip Extension: 15 reps;Both      Physical Therapy Assessment and Plan PT Assessment and Plan Clinical Impression Statement: Able to increase reps without difficulty.  Added lateral and forward step ups with manual cues to decrease hip substitution.  Pt. requires cues to perform sidelying hip abduction/adduction correctly, otherwise able to perform all mat exercises in good form. PT Plan: Continue to progress LE strength and improve gait mechanics.       Problem List Patient Active Problem List  Diagnosis  . HYPERLIPIDEMIA  . HYPERTENSION  . Atrial fibrillation  . PEDAL EDEMA  .  Chronic anticoagulation  . Sleep apnea  . Degenerative joint disease  . Thrombocytopenia  . Difficulty in walking  . Weakness of both legs    PT - End of Session Activity Tolerance: Patient tolerated treatment well General Behavior During Session: [facility] for tasks performed Cognition: [facility] for tasks performed   Lurena Nida, PTA/CLT 08/27/2012, 5:08 PM

## 2012-08-31 ENCOUNTER — Ambulatory Visit (HOSPITAL_COMMUNITY)
Admission: RE | Admit: 2012-08-31 | Discharge: 2012-08-31 | Disposition: A | Discharge status: Home / Self Care | Payer: Medicare Other | Source: Ambulatory Visit | Attending: Orthopedic Surgery | Admitting: Orthopedic Surgery

## 2012-08-31 DIAGNOSIS — R29898 Other symptoms and signs involving the musculoskeletal system: Secondary | ICD-10-CM

## 2012-08-31 DIAGNOSIS — R262 Difficulty in walking, not elsewhere classified: Secondary | ICD-10-CM

## 2012-08-31 NOTE — Progress Notes (Signed)
Physical Therapy Treatment Patient Details  Name: Joshua [name] MRN: [id number] Date of Birth: Mar 04, 1941  Today's Date: 08/31/2012 Time: 6644-0347 PT Time Calculation (min): 43 min  Visit#: 4  of 8   Re-eval: 09/19/12    Authorization:    Authorization Time Period:    Authorization Visit#: 4  of 8    Subjective: Symptoms/Limitations Symptoms: Pt states he continues to feel stronger.  PT states he still feels very weak in the knees however. Pain Assessment Currently in Pain?: Yes Pain Score:   3 Pain Location: Knee Pain Orientation: Right;Left Pain Type: Chronic pain    Exercise/Treatments   Aerobic Elliptical: L1 x 1:00   Standing Heel Raises: 15 reps Knee Flexion: Both;15 reps;Limitations Knee Flexion Limitations: 5# Lateral Step Up: 15 reps Forward Step Up: 15 reps Rocker Board: 2 minutes SLS: R max 13; L 11 Other Standing Knee Exercises: church pews x 10 Seated Long Arc Quad: 15 reps;Weights Long Arc Quad Weight: 5 lbs. Other Seated Knee Exercises: sit to stand R 5 L 5 x Supine Terminal Knee Extension: Both;15 reps;Limitations Terminal Knee Extension Limitations: 5# Sidelying Hip ABduction: Strengthening;15 reps;Limitations Hip ABduction Limitations: 5# Prone  Hip Extension: Both;15 reps;Limitations Hip Extension Limitations: 5#      Physical Therapy Assessment and Plan PT Assessment and Plan Clinical Impression Statement: Cued pt on posture throughout treatment explaining how if he is foward bent walking or doing exercises he is putting increased stress to the anterior aspect of his knee.   PT Plan: begin lunging next visit.    Goals    Problem List Patient Active Problem List  Diagnosis  . HYPERLIPIDEMIA  . HYPERTENSION  . Atrial fibrillation  . PEDAL EDEMA  . Chronic anticoagulation  . Sleep apnea  . Degenerative joint disease  . Thrombocytopenia  . Difficulty in walking  . Weakness of both legs       GP     RUSSELL,CINDY 08/31/2012, 5:00 PM

## 2012-09-04 ENCOUNTER — Ambulatory Visit (HOSPITAL_COMMUNITY)
Admission: RE | Admit: 2012-09-04 | Discharge: 2012-09-04 | Disposition: A | Discharge status: Home / Self Care | Payer: Medicare Other | Source: Ambulatory Visit | Attending: Physical Therapy | Admitting: Physical Therapy

## 2012-09-04 NOTE — Progress Notes (Signed)
Physical Therapy Treatment Patient Details  Name: Joshua [name] MRN: [id number] Date of Birth: Jan 21, 1941  Today's Date: 09/04/2012 Time: 4098-1191 PT Time Calculation (min): 42 min  Visit#: 5  of 8   Re-eval: 09/19/12 Charges: Therex x 39'  Authorization: Medicare Authorization Visit#: 5  of 8    Subjective: Symptoms/Limitations Symptoms: Pt reports no pain, only stiffness. Pain Assessment Currently in Pain?: No/denies Pain Score: 0-No pain   Exercise/Treatments Aerobic Elliptical: L1 x 3' (with supervision secondary to weakness) Machines for Strengthening Cybex Knee Extension: 1pl x 5 B 1 LE at a time Cybex Knee Flexion: 3.5 x 15' Standing Lateral Step Up: 15 reps;Step Height: 4";Hand Hold: 2;Both Forward Step Up: 15 reps;Step Height: 4";Hand Hold: 2;Both Step Down: 10 reps;Step Height: 4";Hand Hold: 2;Both Rocker Board: 2 minutes Seated Other Seated Knee Exercises: sit to stand R 5 L 5 x  Physical Therapy Assessment and Plan PT Assessment and Plan Clinical Impression Statement: Pt continues to displays significant compensation with LLE for RLE weakness. Pt requires multimodal cueing with sit to stands and squats to equalize wt distribution. Pt also requires multimodal cueing with forward step downs to avoid locking R knee.  PT Plan: Continue to progress per PT POC. Begin forward lunge next session.      Problem List Patient Active Problem List  Diagnosis  . HYPERLIPIDEMIA  . HYPERTENSION  . Atrial fibrillation  . PEDAL EDEMA  . Chronic anticoagulation  . Sleep apnea  . Degenerative joint disease  . Thrombocytopenia  . Difficulty in walking  . Weakness of both legs    PT - End of Session Activity Tolerance: Patient tolerated treatment well General Behavior During Session: [facility] for tasks performed Cognition: [facility] for tasks performed  Seth Bake, PTA 09/04/2012, 5:35 PM

## 2012-09-07 ENCOUNTER — Ambulatory Visit (HOSPITAL_COMMUNITY)
Admission: RE | Admit: 2012-09-07 | Discharge: 2012-09-07 | Disposition: A | Discharge status: Home / Self Care | Payer: Medicare Other | Source: Ambulatory Visit | Attending: Internal Medicine | Admitting: Internal Medicine

## 2012-09-07 DIAGNOSIS — R262 Difficulty in walking, not elsewhere classified: Secondary | ICD-10-CM

## 2012-09-07 DIAGNOSIS — R29898 Other symptoms and signs involving the musculoskeletal system: Secondary | ICD-10-CM

## 2012-09-07 NOTE — Progress Notes (Signed)
Physical Therapy Treatment Patient Details  Name: WHIT BRUNI MRN: [id number] Date of Birth: 07-May-1941  Today's Date: 09/07/2012 Time: 1516-1600 PT Time Calculation (min): 44 min  Visit#: 6  of 8   Re-eval: 09/19/12  Charge: therex 34', ice 10'  Authorization: Medicare  Authorization Visit#: 6  of 10    Subjective: Symptoms/Limitations Symptoms: Pt believes the weather has increased B knee pain today 6/10 Pain Assessment Currently in Pain?: Yes Pain Score:   6 Pain Location: Knee Pain Orientation: Right;Left  Objective:   Exercise/Treatments Aerobic Elliptical: 3' L1 Machines for Strengthening Cybex Knee Extension: 1.5 L LE x 10; R LE unable to fully extend this session Cybex Knee Flexion: 3.5 Pl x 15' Standing Forward Lunges: Both;10 reps Lateral Step Up: 15 reps;Step Height: 4";Hand Hold: 2;Both Forward Step Up: 15 reps;Step Height: 4";Hand Hold: 2;Both Step Down: 10 reps;Step Height: 4";Hand Hold: 2;Left;Step Height: 2";Right   Modalities Modalities: Cryotherapy Cryotherapy Number Minutes Cryotherapy: 10 Minutes Cryotherapy Location: Knee Type of Cryotherapy: Ice pack  Physical Therapy Assessment and Plan PT Assessment and Plan Clinical Impression Statement: Pt with significant quad weakness RLE this session, pt unabl;e to fully extend 1PL cybex quad machine this session.  Began forward lunges for functional strengthening with max cueing for form. PT Plan: Continue to progress per PT POC.  Resume rocker board for improved weight distribution with gait.    Goals    Problem List Patient Active Problem List  Diagnosis  . HYPERLIPIDEMIA  . HYPERTENSION  . Atrial fibrillation  . PEDAL EDEMA  . Chronic anticoagulation  . Sleep apnea  . Degenerative joint disease  . Thrombocytopenia  . Difficulty in walking  . Weakness of both legs    PT - End of Session Activity Tolerance: Patient tolerated treatment well;Patient limited by  fatigue General Behavior During Session: [facility] for tasks performed Cognition: [facility] for tasks performed  GP    [name] 09/07/2012, 3:56 PM

## 2012-09-10 ENCOUNTER — Ambulatory Visit (HOSPITAL_COMMUNITY)
Admission: RE | Admit: 2012-09-10 | Discharge: 2012-09-10 | Disposition: A | Discharge status: Home / Self Care | Payer: Medicare Other | Source: Ambulatory Visit | Attending: *Deleted | Admitting: *Deleted

## 2012-09-10 DIAGNOSIS — R29898 Other symptoms and signs involving the musculoskeletal system: Secondary | ICD-10-CM

## 2012-09-10 DIAGNOSIS — R262 Difficulty in walking, not elsewhere classified: Secondary | ICD-10-CM

## 2012-09-10 NOTE — Progress Notes (Signed)
Physical Therapy Treatment Patient Details  Name: Joshua [name] MRN: [id number] Date of Birth: 1941-02-23  Today's Date: 09/10/2012 Time: 4098-1191 PT Time Calculation (min): 48 min  Visit#: 7  of 8   Re-eval: 09/19/12 Assessment Diagnosis: B OA Next MD Visit: aluisio md 10/12/2012 Charge: therex 30', manual 8', ice 10'  Authorization: Medicare  Authorization Time Period:    Authorization Visit#: 7  of 10    Subjective: Symptoms/Limitations Symptoms: Pt stated he feels R knee is really weak today, pain scale R 6/10 L 4/10 Pain Assessment Currently in Pain?: Yes Pain Score:   6 Pain Location: Knee Pain Orientation: Right Multiple Pain Sites: Yes  Objective:  Exercise/Treatments Aerobic Elliptical: 5' L1 for increased activity tolerance Machines for Strengthening Cybex Knee Extension: 1.5 L LE x 15; R LE unable to fully extend this session/ resumed LAQ Cybex Knee Flexion: 3.5 Pl x 15' Standing Forward Lunges: Both;15 reps Lateral Step Up: 15 reps;Step Height: 4";Hand Hold: 2;Both;Limitations Lateral Step Up Limitations: hip hiking 10x Forward Step Up: 15 reps;Step Height: 4";Both;Hand Hold: 1;Hand Hold: 0 Step Down: 10 reps;Step Height: 4";Hand Hold: 2;Left;Step Height: 2";Right Rocker Board: 2 minutes SLS: L 10", R 24" max of 3 Seated Long Arc Quad: 15 reps;Weights Long Arc Quad Weight: 8 lbs.   Manual Therapy Manual Therapy: Joint mobilization Joint Mobilization: grade I-III patella mobs and tib/fib for pain reduction x8' Cryotherapy Number Minutes Cryotherapy: 10 Minutes Cryotherapy Location: Knee (Bilateral knee) Type of Cryotherapy: Ice pack  Physical Therapy Assessment and Plan PT Assessment and Plan Clinical Impression Statement: Increased time with elliptical to improve activity tolerance with noted fatigue at beginning of session.  Significant quad weakness continues especially the RLE.  Resumed LAQ with R LE due to quad weakness and inability to  extend cybex quad machine.  Pt reported pain reduced with manual joint mobs to R LE and ice at end of session. PT Plan: Re-eval next session.    Goals    Problem List Patient Active Problem List  Diagnosis  . HYPERLIPIDEMIA  . HYPERTENSION  . Atrial fibrillation  . PEDAL EDEMA  . Chronic anticoagulation  . Sleep apnea  . Degenerative joint disease  . Thrombocytopenia  . Difficulty in walking  . Weakness of both legs    PT - End of Session Activity Tolerance: Patient limited by pain;Patient limited by fatigue General Behavior During Session: [facility] for tasks performed Cognition: [facility] for tasks performed  GP    Joshua [name] 09/10/2012, 5:43 PM

## 2012-09-14 ENCOUNTER — Ambulatory Visit (HOSPITAL_COMMUNITY)
Admission: RE | Admit: 2012-09-14 | Discharge: 2012-09-14 | Disposition: A | Discharge status: Home / Self Care | Payer: Medicare Other | Source: Ambulatory Visit

## 2012-09-14 DIAGNOSIS — R262 Difficulty in walking, not elsewhere classified: Secondary | ICD-10-CM

## 2012-09-14 DIAGNOSIS — R29898 Other symptoms and signs involving the musculoskeletal system: Secondary | ICD-10-CM

## 2012-09-14 NOTE — Evaluation (Signed)
Physical Therapy  Re-evaluation  Patient Details  Name: Joshua [name] MRN: [id number] Date of Birth: January 05, 1941  Today's Date: 09/14/2012 Time: 1520-1600 PT Time Calculation (min): 40 min  Visit#: 8  of 9   Re-eval:   Assessment Diagnosis: B OA Next MD Visit: 08/27/12 Prior Therapy: none     Past Medical History:  Past Medical History  Diagnosis Date  . Atrial fibrillation 06/2010  . Hypertension   . Hyperlipidemia   . Sleep apnea     Rx-CPAP  . Degenerative joint disease     Right knee  . Chronic anticoagulation    Past Surgical History:  Past Surgical History  Procedure Date  . Ventral hernia repair 03/2008, 02/2009  . Colonoscopy 03/10/2005    Subjective Symptoms/Limitations Symptoms: Pt states he has been doing his exercises twice a day. How long can you sit comfortably?: no problem How long can you stand comfortably?: able to stand for 30 minutes. How long can you walk comfortably?: able to walk for 30 minutes was 10l Pain Assessment Pain Score:   1 Pain Location: Knee Pain Orientation: Right    Prior Functioning  Home Living Lives With: Spouse Type of Home: House Home Access: Stairs to enter Entrance Stairs-Number of Steps: 4 Home Layout: Two level Prior Function Vocation: Part time employment Vocation Requirements: driving  Leisure: Hobbies-yes (Comment)   Sensation/Coordination/Flexibility/Functional Tests Flexibility 90/90: Positive Functional Tests Functional Tests: LEFS 42/64(no running or hopping was 19)  Assessment RLE AROM (degrees) Right Knee Extension: 0  Right Knee Flexion: 125  RLE Strength Right Hip Flexion: 5/5 (was 5/5) Right Hip Extension: 4/5 (was 3/5) Right Hip ABduction:  (4+/5 was 4/5) Right Hip ADduction: 5/5 (was 4/5) Right Knee Flexion: 5/5 (was 5/5) Right Knee Extension: 3/5 (was 3/5 pt giving way due to pain) Right Ankle Dorsiflexion: 5/5 (was 4/5) LLE AROM (degrees) Left Knee Extension: 0  Left Knee  Flexion: 125  LLE Strength Left Hip Flexion: 5/5 (was 4/5) Left Hip Extension:  (4+/5 was 4/5) Left Hip ABduction: 5/5 (was 4/5) Left Hip ADduction: 4/5 Left Knee Flexion: 5/5 (was 5/5) Left Knee Extension: 5/5 (was 4/5) Left Ankle Dorsiflexion: 5/5 (was 4/5)  Exercise/Treatments   Elliptical x 5 min  Went over improvements and areas where pt still has deficits.  Explained the importance of continuing to exercise on his own and the possibility of pt joining a gym.   Physical Therapy Assessment and Plan PT Assessment and Plan Clinical Impression Statement: Pt has improved in all mm grades except R quad which is limited by pain.  Functionally pt states he feels he has improved 40-50% PT Plan: D/C    Goals Home Exercise Program PT Goal: Perform Home Exercise Program - Progress: Met PT Short Term Goals PT Short Term Goal 1 - Progress: Met (except quad on right due to pain) PT Short Term Goal 2 - Progress: Met PT Short Term Goal 3 - Progress: Progressing toward goal (walking 40 minutes a day.) PT Long Term Goals PT Long Term Goal 1 - Progress: Met PT Long Term Goal 2 - Progress: Met Long Term Goal 3 Progress: Progressing toward goal (majority of strength has increased one grade but some 1/2) Long Term Goal 5 Progress: Not met PT Long Term Goal 6: met PT Long Term Goal 7: not met (steps are still painful for pt.)  Problem List Patient Active Problem List  Diagnosis  . HYPERLIPIDEMIA  . HYPERTENSION  . Atrial fibrillation  . PEDAL EDEMA  .  Chronic anticoagulation  . Sleep apnea  . Degenerative joint disease  . Thrombocytopenia  . Difficulty in walking  . Weakness of both legs    PT - End of Session Activity Tolerance: Patient tolerated treatment well General Behavior During Session: [facility] for tasks performed Cognition: [facility] for tasks performed PT Plan of Care PT Home Exercise Plan: new given  GP Functional Assessment Tool Used: LEFS Functional Limitation: Mobility:  Walking and moving around Mobility: Walking and Moving Around Goal Status [phone]): At least 20 percent but less than 40 percent impaired, limited or restricted Mobility: Walking and Moving Around Discharge Status [phone]): At least 20 percent but less than 40 percent impaired, limited or restricted  Carlia Bomkamp,CINDY 09/14/2012, 4:05 PM  Physician Documentation Your signature is required to indicate approval of the treatment plan as stated above.  Please sign and either send electronically or make a copy of this report for your files and return this physician signed original.   Please mark one 1.__approve of plan  2. ___approve of plan with the following conditions.   ______________________________                                                          _____________________ Physician Signature                                                                                                             Date

## 2012-10-03 ENCOUNTER — Ambulatory Visit (INDEPENDENT_AMBULATORY_CARE_PROVIDER_SITE_OTHER): Payer: Medicare Other | Admitting: *Deleted

## 2012-10-03 DIAGNOSIS — Z7901 Long term (current) use of anticoagulants: Secondary | ICD-10-CM

## 2012-10-03 DIAGNOSIS — I4891 Unspecified atrial fibrillation: Secondary | ICD-10-CM | POA: Diagnosis not present

## 2012-10-03 LAB — POCT INR: INR: 2.3

## 2012-10-08 ENCOUNTER — Other Ambulatory Visit: Payer: Self-pay | Admitting: Cardiology

## 2012-10-12 DIAGNOSIS — M1710 Unilateral primary osteoarthritis, unspecified knee: Secondary | ICD-10-CM | POA: Diagnosis not present

## 2012-10-16 ENCOUNTER — Telehealth: Payer: Self-pay | Admitting: *Deleted

## 2012-10-16 NOTE — Telephone Encounter (Signed)
Patient is scheduled for a right TKA on 01/15/12 by Dr Despina Hick and is in need of an appointment for surgical clearance. I attempted to contact him today.  No answer.  Thank you

## 2012-11-02 ENCOUNTER — Encounter: Payer: Self-pay | Admitting: Cardiology

## 2012-11-02 ENCOUNTER — Encounter: Payer: Self-pay | Admitting: *Deleted

## 2012-11-02 ENCOUNTER — Ambulatory Visit (INDEPENDENT_AMBULATORY_CARE_PROVIDER_SITE_OTHER): Payer: Medicare Other | Admitting: Cardiology

## 2012-11-02 VITALS — BP 131/76 | HR 70 | Ht 70.0 in | Wt 238.8 lb

## 2012-11-02 DIAGNOSIS — E785 Hyperlipidemia, unspecified: Secondary | ICD-10-CM | POA: Diagnosis not present

## 2012-11-02 DIAGNOSIS — Z7901 Long term (current) use of anticoagulants: Secondary | ICD-10-CM | POA: Diagnosis not present

## 2012-11-02 DIAGNOSIS — I10 Essential (primary) hypertension: Secondary | ICD-10-CM | POA: Diagnosis not present

## 2012-11-02 MED ORDER — ATORVASTATIN CALCIUM 40 MG PO TABS: 40.0000 mg | ORAL_TABLET | Freq: Every day | ORAL | Status: DC

## 2012-11-02 NOTE — Progress Notes (Signed)
Patient ID: Joshua [name], male   DOB: [birth year], 72 y.o.   MRN: [id number]  HPI: Scheduled return visit for this very nice gentleman with chronic atrial fibrillation. Since his last visit, he has done exceedingly well. He remains active and reports no cardiopulmonary symptoms. He has not developed any new medical issues nor required urgent medical care.  Prior to Admission medications   Medication Sig Start Date End Date Taking? Authorizing Provider  amLODipine (NORVASC) 5 MG tablet Take 5 mg by mouth daily.   Yes Historical Provider, MD  atenolol (TENORMIN) 50 MG tablet Take 100 mg by mouth 2 (two) times daily. 05/25/11 05/01/13 Yes Kathlen Brunswick, MD  digoxin (LANOXIN) 0.25 MG tablet Take 1 tablet (250 mcg total) by mouth daily. 07/05/11 08/01/13 Yes Kathlen Brunswick, MD  lisinopril (PRINIVIL,ZESTRIL) 20 MG tablet Take 1 tablet (20 mg total) by mouth 2 (two) times daily. Take 1/2 tab 07/05/11  Yes Kathlen Brunswick, MD  niacin 500 MG CR capsule Take 500 mg by mouth daily.     Yes Historical Provider, MD  Omega-3 Fatty Acids (FISH OIL) 1000 MG CAPS Take by mouth daily.     Yes Historical Provider, MD  warfarin (COUMADIN) 10 MG tablet  10/08/12  Yes Kathlen Brunswick, MD  No Known Allergies    Past medical history, social history, and family history reviewed and updated.  ROS: Denies chest pain, dyspnea, orthopnea, PND, palpitations, lightheadedness or syncope. Is continuing pain in the right knee, and TKA is anticipated.  PHYSICAL EXAM: BP 131/76  Pulse 70  Ht 5\' 10"  (1.778 m)  Wt 108.296 kg (238 lb 12 oz)  BMI 34.26 kg/m2  SpO2 98%  General-Well developed; no acute distress Body habitus-moderately overweight Neck-No JVD; no carotid bruits Lungs-clear lung fields; resonant to percussion Cardiovascular-normal PMI; normal S1 and S2; irregular rhythm; apical rate of 75 bpm Abdomen-normal bowel sounds; soft and non-tender without masses or organomegaly Musculoskeletal-No deformities, no  cyanosis or clubbing Neurologic-Normal cranial nerves; symmetric strength and tone Skin-Warm, no significant lesions Extremities-distal pulses intact; trace edema  ASSESSMENT AND PLAN:  Ray Bing, MD 11/02/2012 3:03 PM

## 2012-11-02 NOTE — Progress Notes (Deleted)
Name: Joshua [name]    DOB: Sep 18, 1941  Age: 72 y.o.  MR#: [id number]       PCP:  Carylon Perches, MD      Insurance: @PAYORNAME @   CC:   BOTTLES PT WANTS TO ASK IF HE IS CLEAR TO TAKE IBUPROFEN FOR MUSCLE ACHES?  VS BP 131/76  Pulse 70  Ht 5\' 10"  (1.778 m)  Wt 238 lb 12 oz (108.296 kg)  BMI 34.26 kg/m2  SpO2 98%  Weights Current Weight  11/02/12 238 lb 12 oz (108.296 kg)  06/02/11 235 lb (106.595 kg)  05/25/11 228 lb (103.42 kg)    Blood Pressure  BP Readings from Last 3 Encounters:  11/02/12 131/76  06/02/11 117/86  05/25/11 129/77     Admit date:  (Not on file) Last encounter with RMR:  10/08/2012   Allergy No Known Allergies  Current Outpatient Prescriptions  Medication Sig Dispense Refill  . amLODipine (NORVASC) 5 MG tablet Take 5 mg by mouth daily.      Marland Kitchen atenolol (TENORMIN) 50 MG tablet Take 100 mg by mouth 2 (two) times daily.      . digoxin (LANOXIN) 0.25 MG tablet Take 1 tablet (250 mcg total) by mouth daily.  30 tablet  1  . lisinopril (PRINIVIL,ZESTRIL) 20 MG tablet Take 1 tablet (20 mg total) by mouth 2 (two) times daily. Take 1/2 tab  30 tablet  3  . niacin 500 MG CR capsule Take 500 mg by mouth daily.        . Omega-3 Fatty Acids (FISH OIL) 1000 MG CAPS Take by mouth daily.        Marland Kitchen warfarin (COUMADIN) 10 MG tablet         Discontinued Meds:    Medications Discontinued During This Encounter  Medication Reason  . warfarin (COUMADIN) 10 MG tablet     Patient Active Problem List  Diagnosis  . HYPERLIPIDEMIA  . HYPERTENSION  . Atrial fibrillation  . PEDAL EDEMA  . Chronic anticoagulation  . Sleep apnea  . Degenerative joint disease  . Thrombocytopenia  . Difficulty in walking  . Weakness of both legs    LABS Anti-coag visit on 10/03/2012  Component Date Value  . INR 10/03/2012 2.3   Anti-coag visit on 08/22/2012  Component Date Value  . INR 08/22/2012 2.2      Results for this Opt Visit:     Results for orders placed in visit on  10/03/12  POCT INR      Component Value Range   INR 2.3      EKG Orders placed in visit on 07/26/11  . EKG     Prior Assessment and Plan Problem List as of 11/02/2012          HYPERLIPIDEMIA   Last Assessment & Plan Note   05/25/2011 Office Visit Signed 05/25/2011  4:24 PM by Kathlen Brunswick, MD    Lipid profile is somewhat suboptimal, but the patient has no indication for treatment with statins.  Maintenance of a heart healthy diet is advised.    HYPERTENSION   Last Assessment & Plan Note   05/25/2011 Office Visit Signed 05/25/2011  4:25 PM by Kathlen Brunswick, MD    Blood pressure control is excellent, but diltiazem will be withdrawn in an attempt to improve peripheral edema.  Atenolol dosage will be increased.  In the setting of medication changes, control of hypertension will be monitored.    Atrial fibrillation   Last Assessment & Plan  Note   05/25/2011 Office Visit Signed 05/25/2011  4:21 PM by Kathlen Brunswick, MD    Heart rate control is excellent, and patient appears to be asymptomatic.  We will continue the strategy of heart rate control and anticoagulation.    PEDAL EDEMA   Last Assessment & Plan Note   05/25/2011 Office Visit Signed 05/25/2011  4:26 PM by Kathlen Brunswick, MD    Diltiazem may be contributing to peripheral edema and will be discontinued.  Metoprolol will be increased to 75 mg b.id to maintain control of heart rate.  Leg elevation and a low-salt diet were reinforced.  A nursing visit is planned in 2 weeks to reassess heart rate control.    Chronic anticoagulation   Last Assessment & Plan Note   05/25/2011 Office Visit Signed 05/25/2011  4:22 PM by Kathlen Brunswick, MD    Anticoagulation has been stable and therapeutic.  Stool for Hemoccult testing and a CBC will be obtained to exclude occult GI blood loss.    Sleep apnea   Degenerative joint disease   Thrombocytopenia   Difficulty in walking   Weakness of both legs       Imaging: No results found.   FRS  Calculation: Score not calculated. Missing: Total Cholesterol

## 2012-11-02 NOTE — Assessment & Plan Note (Addendum)
Patient is being treated with fish oil and low-dose niacin for elevated triglycerides and low HDL, but needs statin therapy as well. Atorvastatin will be added to medical regime and lipid profile repeated.

## 2012-11-02 NOTE — Assessment & Plan Note (Signed)
No evidence for occult GI blood loss. Stool samples for Hemoccult testing will be requested. INR as have been stable and therapeutic.

## 2012-11-02 NOTE — Patient Instructions (Addendum)
Your physician recommends that you schedule a follow-up appointment in: 1 YEAR  COMPLETE STOOL CARDS AND RETURN TO OFFICE  ADDENDUM:  ADD ATORVASTATIN 40 MG DAILY AND LIPIDS IN 1 MONTH - PT NOTIFIED BY PHONE

## 2012-11-02 NOTE — Assessment & Plan Note (Signed)
No elevation in the systolic or diastolic BP has been recorded for at least the past 4 years. Current medication will be continued.

## 2012-11-08 DIAGNOSIS — I4891 Unspecified atrial fibrillation: Secondary | ICD-10-CM | POA: Diagnosis not present

## 2012-11-08 DIAGNOSIS — M1990 Unspecified osteoarthritis, unspecified site: Secondary | ICD-10-CM | POA: Diagnosis not present

## 2012-11-08 DIAGNOSIS — Z23 Encounter for immunization: Secondary | ICD-10-CM | POA: Diagnosis not present

## 2012-11-08 DIAGNOSIS — I10 Essential (primary) hypertension: Secondary | ICD-10-CM | POA: Diagnosis not present

## 2012-11-15 ENCOUNTER — Ambulatory Visit (INDEPENDENT_AMBULATORY_CARE_PROVIDER_SITE_OTHER): Payer: Medicare Other | Admitting: *Deleted

## 2012-11-15 ENCOUNTER — Telehealth: Payer: Self-pay | Admitting: *Deleted

## 2012-11-15 DIAGNOSIS — Z7901 Long term (current) use of anticoagulants: Secondary | ICD-10-CM | POA: Diagnosis not present

## 2012-11-15 DIAGNOSIS — I4891 Unspecified atrial fibrillation: Secondary | ICD-10-CM

## 2012-11-15 LAB — POCT INR: INR: 2.7

## 2012-11-15 NOTE — Telephone Encounter (Signed)
Patient states he cannot afford Atorvastatin.  Please suggest an alternative.

## 2012-11-15 NOTE — Telephone Encounter (Signed)
Pravastatin 40 mg per day.

## 2012-11-16 ENCOUNTER — Other Ambulatory Visit: Payer: Self-pay | Admitting: *Deleted

## 2012-11-16 MED ORDER — PRAVASTATIN SODIUM 40 MG PO TABS: 40.0000 mg | ORAL_TABLET | Freq: Every evening | ORAL | Status: DC

## 2012-11-16 NOTE — Telephone Encounter (Signed)
Pravastatin 40 mg script sent to [facility] and patient notified that this is on the $4 list.  Verbalized understanding.

## 2012-11-28 ENCOUNTER — Other Ambulatory Visit: Payer: Self-pay | Admitting: Cardiology

## 2012-11-28 DIAGNOSIS — Z7901 Long term (current) use of anticoagulants: Secondary | ICD-10-CM | POA: Diagnosis not present

## 2012-11-28 DIAGNOSIS — I10 Essential (primary) hypertension: Secondary | ICD-10-CM | POA: Diagnosis not present

## 2012-11-28 DIAGNOSIS — E785 Hyperlipidemia, unspecified: Secondary | ICD-10-CM | POA: Diagnosis not present

## 2012-11-28 LAB — LIPID PANEL
Cholesterol: 187 mg/dL (ref 0–200)
HDL: 30 mg/dL — ABNORMAL LOW (ref >39)
LDL Cholesterol: 100 mg/dL — ABNORMAL HIGH (ref 0–99)
Triglycerides: 286 mg/dL — ABNORMAL HIGH (ref <150)

## 2012-11-30 ENCOUNTER — Encounter: Payer: Self-pay | Admitting: Cardiology

## 2012-12-03 ENCOUNTER — Encounter: Payer: Self-pay | Admitting: *Deleted

## 2012-12-03 ENCOUNTER — Other Ambulatory Visit: Payer: Self-pay | Admitting: Cardiology

## 2012-12-11 ENCOUNTER — Other Ambulatory Visit: Payer: Self-pay | Admitting: Orthopedic Surgery

## 2012-12-11 MED ORDER — DEXAMETHASONE SODIUM PHOSPHATE 10 MG/ML IJ SOLN: 10.0000 mg | Freq: Once | INTRAMUSCULAR | Status: DC

## 2012-12-11 MED ORDER — BUPIVACAINE LIPOSOME 1.3 % IJ SUSP: 20.0000 mL | Freq: Once | INTRAMUSCULAR | Status: DC

## 2012-12-11 NOTE — Progress Notes (Signed)
Preoperative surgical orders have been place into the Epic hospital system for Joshua [name] on 12/11/2012, 11:56 AM  by Patrica Duel for surgery on 01/14/13.  Preop Total Knee orders including Experal, IV Tylenol, and IV Decadron as long as there are no contraindications to the above medications. Avel Peace, PA-C

## 2012-12-21 ENCOUNTER — Ambulatory Visit (INDEPENDENT_AMBULATORY_CARE_PROVIDER_SITE_OTHER): Payer: Medicare Other | Admitting: *Deleted

## 2012-12-21 DIAGNOSIS — Z7901 Long term (current) use of anticoagulants: Secondary | ICD-10-CM

## 2012-12-21 LAB — POC HEMOCCULT BLD/STL (HOME/3-CARD/SCREEN)
Card #3 Fecal Occult Blood, POC: NEGATIVE
Fecal Occult Blood, POC: NEGATIVE

## 2012-12-24 ENCOUNTER — Ambulatory Visit (INDEPENDENT_AMBULATORY_CARE_PROVIDER_SITE_OTHER): Payer: Medicare Other | Admitting: *Deleted

## 2012-12-24 DIAGNOSIS — Z7901 Long term (current) use of anticoagulants: Secondary | ICD-10-CM

## 2012-12-24 DIAGNOSIS — I4891 Unspecified atrial fibrillation: Secondary | ICD-10-CM | POA: Diagnosis not present

## 2012-12-27 DIAGNOSIS — M1710 Unilateral primary osteoarthritis, unspecified knee: Secondary | ICD-10-CM | POA: Diagnosis not present

## 2012-12-28 ENCOUNTER — Encounter (HOSPITAL_COMMUNITY): Payer: Self-pay | Admitting: Pharmacy Technician

## 2013-01-03 ENCOUNTER — Other Ambulatory Visit (HOSPITAL_COMMUNITY): Payer: Self-pay | Admitting: Orthopedic Surgery

## 2013-01-03 NOTE — Progress Notes (Signed)
lov note dr Dietrich Pates cardiology 11-02-2012 epic Medical clearance note/ lov  note dr Channing Mutters fagan 11-08-2012 on chart

## 2013-01-03 NOTE — Patient Instructions (Addendum)
20 KYLAR LEONHARDT  01/03/2013   Your procedure is scheduled on: 01-14-2013  Report to [facility] at  1045 AM.  Call this number if you have problems the morning of surgery [phone]                                 Bring cpap mask and tubing only     Do not eat food  :After Midnight.              clear lqiuids midnight until 745 am day of surgery, then nothing by mouth            Take these medicines the morning of surgery with A SIP OF WATER: atenolol, amlodipine, digoxin                                SEE [place] PREPARING FOR SURGERY SHEET   Do not wear jewelry, make-up or nail polish.  Do not wear lotions, powders, or perfumes. You may wear deodorant.   Men may shave face and neck.  Do not bring valuables to the hospital.  Contacts, dentures or bridgework may not be worn into surgery.  Leave suitcase in the car. After surgery it may be brought to your room.  For patients admitted to the hospital, checkout time is 11:00 AM the day of discharge.   Patients discharged the day of surgery will not be allowed to drive home.  Name and phone number of your driver:  Special Instructions: N/A   Please read over the following fact sheets that you were given: MRSA Information, blood fact sheet, clear liquid sheet, incentive spirometer sheet Call Cain Sieve RN pre op nurse if needed [phone]    FAILURE TO FOLLOW THESE INSTRUCTIONS MAY RESULT IN THE CANCELLATION OF YOUR SURGERY. PATIENT SIGNATURE___________________________________________

## 2013-01-04 ENCOUNTER — Encounter (HOSPITAL_COMMUNITY)
Admission: RE | Admit: 2013-01-04 | Discharge: 2013-01-04 | Disposition: A | Discharge status: Home / Self Care | Payer: Medicare Other | Source: Ambulatory Visit | Attending: Orthopedic Surgery | Admitting: Orthopedic Surgery

## 2013-01-04 ENCOUNTER — Encounter (HOSPITAL_COMMUNITY): Payer: Self-pay

## 2013-01-04 ENCOUNTER — Ambulatory Visit (HOSPITAL_COMMUNITY)
Admission: RE | Admit: 2013-01-04 | Discharge: 2013-01-04 | Disposition: A | Discharge status: Home / Self Care | Payer: Medicare Other | Source: Ambulatory Visit | Attending: Orthopedic Surgery | Admitting: Orthopedic Surgery

## 2013-01-04 DIAGNOSIS — Q254 Congenital malformation of aorta unspecified: Secondary | ICD-10-CM | POA: Diagnosis not present

## 2013-01-04 DIAGNOSIS — R9431 Abnormal electrocardiogram [ECG] [EKG]: Secondary | ICD-10-CM | POA: Insufficient documentation

## 2013-01-04 DIAGNOSIS — Z01810 Encounter for preprocedural cardiovascular examination: Secondary | ICD-10-CM | POA: Insufficient documentation

## 2013-01-04 DIAGNOSIS — I10 Essential (primary) hypertension: Secondary | ICD-10-CM | POA: Diagnosis not present

## 2013-01-04 DIAGNOSIS — I4891 Unspecified atrial fibrillation: Secondary | ICD-10-CM | POA: Insufficient documentation

## 2013-01-04 DIAGNOSIS — Z01818 Encounter for other preprocedural examination: Secondary | ICD-10-CM | POA: Insufficient documentation

## 2013-01-04 DIAGNOSIS — Z01812 Encounter for preprocedural laboratory examination: Secondary | ICD-10-CM | POA: Diagnosis present

## 2013-01-04 HISTORY — DX: Benign prostatic hyperplasia without lower urinary tract symptoms: N40.0

## 2013-01-04 LAB — COMPREHENSIVE METABOLIC PANEL
ALT: 22 U/L (ref 0–53)
AST: 25 U/L (ref 0–37)
CO2: 26 mEq/L (ref 19–32)
Calcium: 9.1 mg/dL (ref 8.4–10.5)
Chloride: 105 mEq/L (ref 96–112)
GFR calc Af Amer: >90 mL/min (ref >90)
GFR calc non Af Amer: 87 mL/min — ABNORMAL LOW (ref >90)
Glucose, Bld: 118 mg/dL — ABNORMAL HIGH (ref 70–99)
Sodium: 139 mEq/L (ref 135–145)
Total Bilirubin: 1 mg/dL (ref 0.3–1.2)

## 2013-01-04 LAB — URINE MICROSCOPIC-ADD ON

## 2013-01-04 LAB — URINALYSIS, ROUTINE W REFLEX MICROSCOPIC
Bilirubin Urine: NEGATIVE
Glucose, UA: NEGATIVE mg/dL
Ketones, ur: NEGATIVE mg/dL
Protein, ur: NEGATIVE mg/dL
pH: 6 (ref 5.0–8.0)

## 2013-01-04 LAB — CBC
Hemoglobin: 15.1 g/dL (ref 13.0–17.0)
MCH: 31.7 pg (ref 26.0–34.0)
MCV: 91.8 fL (ref 78.0–100.0)
Platelets: 127 10*3/uL — ABNORMAL LOW (ref 150–400)
RBC: 4.77 MIL/uL (ref 4.22–5.81)
WBC: 4.3 10*3/uL (ref 4.0–10.5)

## 2013-01-04 NOTE — Progress Notes (Signed)
Pt, ptt, cbc, mirco, ua results faxed to dr Lequita Halt by epic

## 2013-01-05 LAB — URINE CULTURE
Colony Count: NO GROWTH
Culture: NO GROWTH

## 2013-01-13 ENCOUNTER — Other Ambulatory Visit: Payer: Self-pay | Admitting: Orthopedic Surgery

## 2013-01-13 NOTE — H&P (Signed)
Joshua [name]  DOB: 11-10-1940 Married / Language: English / Race: White Male  Date of Admission:  01/14/2013  Chief Complaint:  Right Knee Pain  History of Present Illness The patient is a 72 year old male who comes in for a preoperative History and Physical. The patient is scheduled for a right total knee arthroplasty to be performed by Dr. Gus Rankin. Aluisio, MD at [facility] on 01/14/2013. The patient is a 72 year old male who presents for follow up of their knee. The patient is being followed for their bilateral knee pain and osteoarthritis. Symptoms reported today include: pain and aching. The patient feels that they are doing poorly and report their pain level to be mild to moderate. Current treatment includes: home exercise program. The following medication has been used for pain control: none. The patient has reported improvement of their symptoms with: physical therapy. The patient states that his knee is getting progressively worse over time. The physical therapy made him feel stronger but he still has considerable pain. He is an avid outdoorsman and is having a harder time doing things he desires. The right knee is worse than the left knee but both knees bother him. He is at a stage now where he wants to do something with the right knee in order to get back to his regular activity level. He elects to proceed with surgery. They have been treated conservatively in the past for the above stated problem and despite conservative measures, they continue to have progressive pain and severe functional limitations and dysfunction. They have failed non-operative management including home exercise, medications, and injections. It is felt that they would benefit from undergoing total joint replacement. Risks and benefits of the procedure have been discussed with the patient and they elect to proceed with surgery. There are no active contraindications to surgery such as ongoing  infection or rapidly progressive neurological disease.   Problem List Osteoarthritis, knee (715.96)   Allergies No Known Drug Allergies. 07/24/2012   Family History Hypertension. father   Social History Living situation. live with spouse Marital status. married Exercise. Exercises never Illicit drug use. no Pain Contract. no Tobacco / smoke exposure. yes Tobacco use. current some days smoker Drug/Alcohol Rehab (Previously). no Current work status. working part time Drug/Alcohol Rehab (Currently). no Alcohol use. current drinker; drinks hard liquor; only occasionally per week Children. 1 Post-Surgical Plans. Plan is to go to [facility].   Medication History Atenolol ( Oral) Specific dose unknown - Active. Digoxin (0.25MG  Tablet, Oral) Active. Lisinopril (20MG  Tablet, Oral) Active. Niacin ER (500MG  Capsule ER, Oral) Active. Omega 3 (1000MG  Capsule, Oral) Active. Warfarin Sodium (10MG  Tablet, Oral) Active. AmLODIPine Besylate (5MG  Tablet, Oral) Active.  Past Surgical History No pertinent past surgical history   Medical History High blood pressure Sleep Apnea. uses CPAP Hypercholesterolemia Atrial Fibrillation. Chronic Measles Mumps   Review of Systems General:Present- Night Sweats. Not Present- Chills, Fever, Fatigue, Weight Gain, Weight Loss and Memory Loss. Skin:Not Present- Hives, Itching, Rash, Eczema and Lesions. HEENT:Not Present- Tinnitus, Headache, Double Vision, Visual Loss, Hearing Loss and Dentures. Respiratory:Present- Shortness of breath with exertion. Not Present- Shortness of breath at rest, Allergies, Coughing up blood and Chronic Cough. Cardiovascular:Not Present- Chest Pain, Racing/skipping heartbeats, Difficulty Breathing Lying Down, Murmur, Swelling and Palpitations. Gastrointestinal:Not Present- Bloody Stool, Heartburn, Abdominal Pain, Vomiting, Nausea, Constipation, Diarrhea, Difficulty Swallowing,  Jaundice and Loss of appetitie. Male Genitourinary:Present- Nocturia and Urinating at Night. Not Present- Urinary frequency, Blood in Urine, Weak urinary  stream, Discharge, Flank Pain, Incontinence, Painful Urination, Urgency and Urinary Retention. Musculoskeletal:Present- Joint Pain. Not Present- Muscle Weakness, Muscle Pain, Joint Swelling, Back Pain, Morning Stiffness and Spasms. Neurological:Not Present- Tremor, Dizziness, Blackout spells, Paralysis, Difficulty with balance and Weakness. Psychiatric:Not Present- Insomnia.   Vitals Weight: 230 lb Height: 70 in Body Surface Area: 2.27 m Body Mass Index: 33 kg/m Pulse: 68 (Irregular) Resp.: 14 (Unlabored) BP: 132/82 (Sitting, Right Arm, Standard)    Physical Exam The physical exam findings are as follows:  Note: Patient is a 72 year old male with continued knee pain.   General Mental Status - Alert, cooperative and good historian. General Appearance- pleasant. Not in acute distress. Orientation- Oriented X3. Build & Nutrition- Well nourished and Well developed.   Head and Neck Head- normocephalic, atraumatic . Neck Global Assessment- supple. no bruit auscultated on the right and no bruit auscultated on the left.   Eye Vision- Wears corrective lenses. Pupil- Bilateral- Regular and Round. Motion- Bilateral- EOMI.   Chest and Lung Exam Auscultation: Breath sounds:- clear at anterior chest wall and - clear at posterior chest wall. Adventitious sounds:- No Adventitious sounds.   Cardiovascular Auscultation:Rhythm- Regular rate and rhythm. Heart Sounds- S1 WNL and S2 WNL. Murmurs & Other Heart Sounds:Auscultation of the heart reveals - No Murmurs.   Abdomen Inspection:Contour- Generalized moderate distention. Palpation/Percussion:Tenderness- Abdomen is non-tender to palpation. Rigidity (guarding)- Abdomen is soft. Auscultation:Auscultation of the abdomen reveals - Bowel  sounds normal.   Male Genitourinary Not done, not pertinent to present illness  Musculoskeletal On exam well developed male, alert and oriented in no apparent distress. Both hips, normal range of motion and no discomfort. Both knees show no effusion. He has slight varus both knees. Range about 5 to 120 each side with tenderness medial greater than lateral and no instability noted. Pulses sensation motor intact.  RADIOGRAPHS: AP both knees and lateral show moderate to advanced arthritic changes in both knees medial and patellofemoral compartments with bone on bone changes.  Assessment & Plan Osteoarthritis, knee (715.96) Impression: Right Knee  Note: Plan is for a Right Total Knee Replacement by Dr. Lequita Halt.  Plan is to go to [facility] after the hospital stay.  PCP - Dr. Carylon Perches - Patient has been seen preoperatively and felt to be stable for surgery. The patient was recommended to stop his Coumadin 4 days prior to surgery as per Dr. Ouida Sills.  Signed electronically by Roberts Gaudy, PA-C

## 2013-01-14 ENCOUNTER — Encounter (HOSPITAL_COMMUNITY)
Admission: RE | Disposition: A | Discharge status: Skilled Nursing Facility | Payer: Self-pay | Source: Ambulatory Visit | Attending: Orthopedic Surgery

## 2013-01-14 ENCOUNTER — Encounter (HOSPITAL_COMMUNITY): Payer: Self-pay | Admitting: *Deleted

## 2013-01-14 ENCOUNTER — Inpatient Hospital Stay (HOSPITAL_COMMUNITY)
Admission: RE | Admit: 2013-01-14 | Discharge: 2013-01-17 | DRG: 470 | Disposition: A | Discharge status: Skilled Nursing Facility | Payer: Medicare Other | Source: Ambulatory Visit | Attending: Orthopedic Surgery | Admitting: Orthopedic Surgery

## 2013-01-14 ENCOUNTER — Inpatient Hospital Stay (HOSPITAL_COMMUNITY): Payer: Medicare Other | Admitting: *Deleted

## 2013-01-14 DIAGNOSIS — Z7901 Long term (current) use of anticoagulants: Secondary | ICD-10-CM | POA: Diagnosis not present

## 2013-01-14 DIAGNOSIS — E871 Hypo-osmolality and hyponatremia: Secondary | ICD-10-CM

## 2013-01-14 DIAGNOSIS — M1710 Unilateral primary osteoarthritis, unspecified knee: Principal | ICD-10-CM | POA: Diagnosis present

## 2013-01-14 DIAGNOSIS — N400 Enlarged prostate without lower urinary tract symptoms: Secondary | ICD-10-CM | POA: Diagnosis present

## 2013-01-14 DIAGNOSIS — G4730 Sleep apnea, unspecified: Secondary | ICD-10-CM | POA: Diagnosis present

## 2013-01-14 DIAGNOSIS — E785 Hyperlipidemia, unspecified: Secondary | ICD-10-CM | POA: Diagnosis present

## 2013-01-14 DIAGNOSIS — M179 Osteoarthritis of knee, unspecified: Secondary | ICD-10-CM | POA: Diagnosis present

## 2013-01-14 DIAGNOSIS — M129 Arthropathy, unspecified: Secondary | ICD-10-CM | POA: Diagnosis not present

## 2013-01-14 DIAGNOSIS — N401 Enlarged prostate with lower urinary tract symptoms: Secondary | ICD-10-CM | POA: Diagnosis not present

## 2013-01-14 DIAGNOSIS — Z471 Aftercare following joint replacement surgery: Secondary | ICD-10-CM | POA: Diagnosis not present

## 2013-01-14 DIAGNOSIS — R262 Difficulty in walking, not elsewhere classified: Secondary | ICD-10-CM | POA: Diagnosis not present

## 2013-01-14 DIAGNOSIS — Z96659 Presence of unspecified artificial knee joint: Secondary | ICD-10-CM | POA: Diagnosis not present

## 2013-01-14 DIAGNOSIS — I10 Essential (primary) hypertension: Secondary | ICD-10-CM | POA: Diagnosis present

## 2013-01-14 DIAGNOSIS — IMO0002 Reserved for concepts with insufficient information to code with codable children: Secondary | ICD-10-CM | POA: Diagnosis not present

## 2013-01-14 DIAGNOSIS — M6281 Muscle weakness (generalized): Secondary | ICD-10-CM | POA: Diagnosis not present

## 2013-01-14 DIAGNOSIS — Z96651 Presence of right artificial knee joint: Secondary | ICD-10-CM

## 2013-01-14 DIAGNOSIS — M25569 Pain in unspecified knee: Secondary | ICD-10-CM | POA: Diagnosis not present

## 2013-01-14 DIAGNOSIS — M159 Polyosteoarthritis, unspecified: Secondary | ICD-10-CM | POA: Diagnosis not present

## 2013-01-14 DIAGNOSIS — R0602 Shortness of breath: Secondary | ICD-10-CM | POA: Diagnosis not present

## 2013-01-14 DIAGNOSIS — D62 Acute posthemorrhagic anemia: Secondary | ICD-10-CM

## 2013-01-14 DIAGNOSIS — R279 Unspecified lack of coordination: Secondary | ICD-10-CM | POA: Diagnosis not present

## 2013-01-14 HISTORY — PX: TOTAL KNEE ARTHROPLASTY: SHX125

## 2013-01-14 LAB — ABO/RH: ABO/RH(D): O POS

## 2013-01-14 LAB — TYPE AND SCREEN
ABO/RH(D): O POS
Antibody Screen: NEGATIVE

## 2013-01-14 LAB — PROTIME-INR: INR: 1.06 (ref 0.00–1.49)

## 2013-01-14 SURGERY — ARTHROPLASTY, KNEE, TOTAL
Anesthesia: Spinal | Site: Knee | Laterality: Right | Wound class: Clean

## 2013-01-14 MED ORDER — TRAMADOL HCL 50 MG PO TABS: 50.0000 mg | ORAL_TABLET | Freq: Four times a day (QID) | ORAL | Status: DC | PRN

## 2013-01-14 MED ORDER — NIACIN ER 500 MG PO CPCR
500.0000 mg | ORAL_CAPSULE | Freq: Every day | ORAL | Status: DC
  Administered 2013-01-14 – 2013-01-17 (×4): 500 mg via ORAL
  Filled 2013-01-14 (×4): qty 1

## 2013-01-14 MED ORDER — ENOXAPARIN SODIUM 30 MG/0.3ML ~~LOC~~ SOLN
30.0000 mg | Freq: Two times a day (BID) | SUBCUTANEOUS | Status: DC
  Administered 2013-01-15 – 2013-01-17 (×5): 30 mg via SUBCUTANEOUS
  Filled 2013-01-14 (×7): qty 0.3

## 2013-01-14 MED ORDER — METHOCARBAMOL 500 MG PO TABS
500.0000 mg | ORAL_TABLET | Freq: Four times a day (QID) | ORAL | Status: DC | PRN
  Administered 2013-01-14 – 2013-01-16 (×3): 500 mg via ORAL
  Filled 2013-01-14 (×3): qty 1

## 2013-01-14 MED ORDER — BISACODYL 10 MG RE SUPP: 10.0000 mg | Freq: Every day | RECTAL | Status: DC | PRN

## 2013-01-14 MED ORDER — COUMADIN BOOK
Freq: Once | Status: DC
  Filled 2013-01-14: qty 1

## 2013-01-14 MED ORDER — FLEET ENEMA 7-19 GM/118ML RE ENEM: 1.0000 | ENEMA | Freq: Once | RECTAL | Status: AC | PRN

## 2013-01-14 MED ORDER — STERILE WATER FOR IRRIGATION IR SOLN
Status: DC | PRN
  Administered 2013-01-14: 3000 mL

## 2013-01-14 MED ORDER — AMLODIPINE BESYLATE 5 MG PO TABS
5.0000 mg | ORAL_TABLET | Freq: Every day | ORAL | Status: DC
  Administered 2013-01-15 – 2013-01-17 (×3): 5 mg via ORAL
  Filled 2013-01-14 (×3): qty 1

## 2013-01-14 MED ORDER — LACTATED RINGERS IV SOLN: INTRAVENOUS | Status: DC

## 2013-01-14 MED ORDER — SIMVASTATIN 20 MG PO TABS
20.0000 mg | ORAL_TABLET | Freq: Every day | ORAL | Status: DC
  Administered 2013-01-14 – 2013-01-16 (×3): 20 mg via ORAL
  Filled 2013-01-14 (×4): qty 1

## 2013-01-14 MED ORDER — SODIUM CHLORIDE 0.9 % IV SOLN
INTRAVENOUS | Status: DC
  Administered 2013-01-14 – 2013-01-15 (×2): via INTRAVENOUS

## 2013-01-14 MED ORDER — CEFAZOLIN SODIUM-DEXTROSE 2-3 GM-% IV SOLR
2.0000 g | INTRAVENOUS | Status: AC
  Administered 2013-01-14: 2 g via INTRAVENOUS

## 2013-01-14 MED ORDER — METHOCARBAMOL 100 MG/ML IJ SOLN: 500.0000 mg | Freq: Four times a day (QID) | INTRAMUSCULAR | Status: DC | PRN

## 2013-01-14 MED ORDER — METOCLOPRAMIDE HCL 5 MG/ML IJ SOLN: 5.0000 mg | Freq: Three times a day (TID) | INTRAMUSCULAR | Status: DC | PRN

## 2013-01-14 MED ORDER — 0.9 % SODIUM CHLORIDE (POUR BTL) OPTIME
TOPICAL | Status: DC | PRN
  Administered 2013-01-14: 1000 mL

## 2013-01-14 MED ORDER — DOCUSATE SODIUM 100 MG PO CAPS
100.0000 mg | ORAL_CAPSULE | Freq: Two times a day (BID) | ORAL | Status: DC
  Administered 2013-01-14 – 2013-01-17 (×6): 100 mg via ORAL

## 2013-01-14 MED ORDER — ACETAMINOPHEN 650 MG RE SUPP: 650.0000 mg | Freq: Four times a day (QID) | RECTAL | Status: DC | PRN

## 2013-01-14 MED ORDER — ONDANSETRON HCL 4 MG PO TABS: 4.0000 mg | ORAL_TABLET | Freq: Four times a day (QID) | ORAL | Status: DC | PRN

## 2013-01-14 MED ORDER — ACETAMINOPHEN 325 MG PO TABS: 650.0000 mg | ORAL_TABLET | Freq: Four times a day (QID) | ORAL | Status: DC | PRN

## 2013-01-14 MED ORDER — MIDAZOLAM HCL 5 MG/5ML IJ SOLN
INTRAMUSCULAR | Status: DC | PRN
  Administered 2013-01-14 (×2): 1 mg via INTRAVENOUS

## 2013-01-14 MED ORDER — PROMETHAZINE HCL 25 MG/ML IJ SOLN: 6.2500 mg | INTRAMUSCULAR | Status: DC | PRN

## 2013-01-14 MED ORDER — ACETAMINOPHEN 10 MG/ML IV SOLN
1000.0000 mg | Freq: Four times a day (QID) | INTRAVENOUS | Status: AC
  Administered 2013-01-14 – 2013-01-15 (×4): 1000 mg via INTRAVENOUS
  Filled 2013-01-14 (×7): qty 100

## 2013-01-14 MED ORDER — FENTANYL CITRATE 0.05 MG/ML IJ SOLN
INTRAMUSCULAR | Status: DC | PRN
  Administered 2013-01-14 (×5): 50 ug via INTRAVENOUS

## 2013-01-14 MED ORDER — ONDANSETRON HCL 4 MG/2ML IJ SOLN: 4.0000 mg | Freq: Four times a day (QID) | INTRAMUSCULAR | Status: DC | PRN

## 2013-01-14 MED ORDER — PHENYLEPHRINE HCL 10 MG/ML IJ SOLN
INTRAMUSCULAR | Status: DC | PRN
  Administered 2013-01-14: 10 ug via INTRAVENOUS
  Administered 2013-01-14: 40 ug via INTRAVENOUS
  Administered 2013-01-14 (×2): 20 ug via INTRAVENOUS
  Administered 2013-01-14: 10 ug via INTRAVENOUS

## 2013-01-14 MED ORDER — SODIUM CHLORIDE 0.9 % IJ SOLN
INTRAMUSCULAR | Status: DC | PRN
  Administered 2013-01-14: 15:00:00

## 2013-01-14 MED ORDER — WARFARIN SODIUM 6 MG PO TABS
12.0000 mg | ORAL_TABLET | Freq: Once | ORAL | Status: AC
  Administered 2013-01-14: 12 mg via ORAL
  Filled 2013-01-14: qty 2

## 2013-01-14 MED ORDER — WARFARIN VIDEO: Freq: Once | Status: DC

## 2013-01-14 MED ORDER — CEFAZOLIN SODIUM-DEXTROSE 2-3 GM-% IV SOLR
2.0000 g | Freq: Four times a day (QID) | INTRAVENOUS | Status: AC
  Administered 2013-01-14 – 2013-01-15 (×2): 2 g via INTRAVENOUS
  Filled 2013-01-14 (×2): qty 50

## 2013-01-14 MED ORDER — DEXAMETHASONE SODIUM PHOSPHATE 10 MG/ML IJ SOLN: 10.0000 mg | Freq: Once | INTRAMUSCULAR | Status: AC

## 2013-01-14 MED ORDER — PHENOL 1.4 % MT LIQD: 1.0000 | OROMUCOSAL | Status: DC | PRN

## 2013-01-14 MED ORDER — BUPIVACAINE LIPOSOME 1.3 % IJ SUSP
20.0000 mL | Freq: Once | INTRAMUSCULAR | Status: DC
  Filled 2013-01-14: qty 20

## 2013-01-14 MED ORDER — HYDROMORPHONE HCL PF 1 MG/ML IJ SOLN: 0.2500 mg | INTRAMUSCULAR | Status: DC | PRN

## 2013-01-14 MED ORDER — WARFARIN - PHARMACIST DOSING INPATIENT: Freq: Every day | Status: DC

## 2013-01-14 MED ORDER — DIPHENHYDRAMINE HCL 12.5 MG/5ML PO ELIX: 12.5000 mg | ORAL_SOLUTION | ORAL | Status: DC | PRN

## 2013-01-14 MED ORDER — MENTHOL 3 MG MT LOZG: 1.0000 | LOZENGE | OROMUCOSAL | Status: DC | PRN

## 2013-01-14 MED ORDER — DIGOXIN 250 MCG PO TABS
0.2500 mg | ORAL_TABLET | Freq: Every day | ORAL | Status: DC
  Administered 2013-01-15 – 2013-01-17 (×3): 0.25 mg via ORAL
  Filled 2013-01-14 (×4): qty 1

## 2013-01-14 MED ORDER — MEPERIDINE HCL 50 MG/ML IJ SOLN: 6.2500 mg | INTRAMUSCULAR | Status: DC | PRN

## 2013-01-14 MED ORDER — ACETAMINOPHEN 10 MG/ML IV SOLN: 1000.0000 mg | Freq: Once | INTRAVENOUS | Status: DC

## 2013-01-14 MED ORDER — SODIUM CHLORIDE 0.9 % IR SOLN
Status: DC | PRN
  Administered 2013-01-14: 1000 mL

## 2013-01-14 MED ORDER — ACETAMINOPHEN 10 MG/ML IV SOLN
INTRAVENOUS | Status: DC | PRN
  Administered 2013-01-14: 1000 mg via INTRAVENOUS

## 2013-01-14 MED ORDER — POLYETHYLENE GLYCOL 3350 17 G PO PACK: 17.0000 g | PACK | Freq: Every day | ORAL | Status: DC | PRN

## 2013-01-14 MED ORDER — MORPHINE SULFATE 2 MG/ML IJ SOLN
1.0000 mg | INTRAMUSCULAR | Status: DC | PRN
  Administered 2013-01-14 – 2013-01-15 (×3): 1 mg via INTRAVENOUS
  Filled 2013-01-14 (×3): qty 1

## 2013-01-14 MED ORDER — METOCLOPRAMIDE HCL 10 MG PO TABS: 5.0000 mg | ORAL_TABLET | Freq: Three times a day (TID) | ORAL | Status: DC | PRN

## 2013-01-14 MED ORDER — ATENOLOL 100 MG PO TABS
100.0000 mg | ORAL_TABLET | Freq: Two times a day (BID) | ORAL | Status: DC
  Administered 2013-01-14 – 2013-01-17 (×6): 100 mg via ORAL
  Filled 2013-01-14 (×7): qty 1

## 2013-01-14 MED ORDER — OXYCODONE HCL 5 MG PO TABS
5.0000 mg | ORAL_TABLET | ORAL | Status: DC | PRN
Start: 2013-01-14 — End: 2013-01-17
  Administered 2013-01-14: 5 mg via ORAL
  Administered 2013-01-15 – 2013-01-16 (×5): 10 mg via ORAL
  Administered 2013-01-16: 5 mg via ORAL
  Administered 2013-01-16 – 2013-01-17 (×4): 10 mg via ORAL
  Filled 2013-01-14: qty 2
  Filled 2013-01-14: qty 1
  Filled 2013-01-14 (×5): qty 2
  Filled 2013-01-14: qty 1
  Filled 2013-01-14 (×3): qty 2

## 2013-01-14 MED ORDER — SODIUM CHLORIDE 0.9 % IV SOLN: INTRAVENOUS | Status: DC

## 2013-01-14 MED ORDER — LACTATED RINGERS IV SOLN
INTRAVENOUS | Status: DC
  Administered 2013-01-14: 1000 mL via INTRAVENOUS
  Administered 2013-01-14: 15:00:00 via INTRAVENOUS

## 2013-01-14 MED ORDER — DEXAMETHASONE 6 MG PO TABS
10.0000 mg | ORAL_TABLET | Freq: Once | ORAL | Status: AC
  Administered 2013-01-15: 10 mg via ORAL
  Filled 2013-01-14: qty 1

## 2013-01-14 MED ORDER — PROPOFOL 10 MG/ML IV EMUL
INTRAVENOUS | Status: DC | PRN
  Administered 2013-01-14: 120 ug/kg/min via INTRAVENOUS

## 2013-01-14 MED ORDER — ONDANSETRON HCL 4 MG/2ML IJ SOLN
INTRAMUSCULAR | Status: DC | PRN
  Administered 2013-01-14 (×2): 2 mg via INTRAVENOUS

## 2013-01-14 SURGICAL SUPPLY — 54 items
BAG SPEC THK2 15X12 ZIP CLS (MISCELLANEOUS) ×1
BAG ZIPLOCK 12X15 (MISCELLANEOUS) ×2 IMPLANT
BANDAGE ELASTIC 6 VELCRO ST LF (GAUZE/BANDAGES/DRESSINGS) ×2 IMPLANT
BANDAGE ESMARK 6X9 LF (GAUZE/BANDAGES/DRESSINGS) ×1 IMPLANT
BLADE SAG 18X100X1.27 (BLADE) ×2 IMPLANT
BLADE SAW SGTL 11.0X1.19X90.0M (BLADE) ×2 IMPLANT
BNDG CMPR 9X6 STRL LF SNTH (GAUZE/BANDAGES/DRESSINGS) ×1
BNDG ESMARK 6X9 LF (GAUZE/BANDAGES/DRESSINGS) ×2
BOWL SMART MIX CTS (DISPOSABLE) ×2 IMPLANT
CEMENT HV SMART SET (Cement) ×4 IMPLANT
CLOTH BEACON ORANGE TIMEOUT ST (SAFETY) ×2 IMPLANT
CUFF TOURN SGL QUICK 34 (TOURNIQUET CUFF) ×2
CUFF TRNQT CYL 34X4X40X1 (TOURNIQUET CUFF) ×1 IMPLANT
DRAPE EXTREMITY T 121X128X90 (DRAPE) ×2 IMPLANT
DRAPE POUCH INSTRU U-SHP 10X18 (DRAPES) ×2 IMPLANT
DRAPE U-SHAPE 47X51 STRL (DRAPES) ×2 IMPLANT
DRSG ADAPTIC 3X8 NADH LF (GAUZE/BANDAGES/DRESSINGS) ×2 IMPLANT
DURAPREP 26ML APPLICATOR (WOUND CARE) ×2 IMPLANT
ELECT REM PT RETURN 9FT ADLT (ELECTROSURGICAL) ×2
ELECTRODE REM PT RTRN 9FT ADLT (ELECTROSURGICAL) ×1 IMPLANT
EVACUATOR 1/8 PVC DRAIN (DRAIN) ×2 IMPLANT
FACESHIELD LNG OPTICON STERILE (SAFETY) ×10 IMPLANT
GLOVE BIO SURGEON STRL SZ7.5 (GLOVE) ×2 IMPLANT
GLOVE BIO SURGEON STRL SZ8 (GLOVE) ×2 IMPLANT
GLOVE BIOGEL PI IND STRL 8 (GLOVE) ×2 IMPLANT
GLOVE BIOGEL PI INDICATOR 8 (GLOVE) ×2
GLOVE SURG SS PI 6.5 STRL IVOR (GLOVE) ×4 IMPLANT
GOWN STRL NON-REIN LRG LVL3 (GOWN DISPOSABLE) ×4 IMPLANT
GOWN STRL REIN XL XLG (GOWN DISPOSABLE) ×2 IMPLANT
HANDPIECE INTERPULSE COAX TIP (DISPOSABLE) ×2
IMMOBILIZER KNEE 20 (SOFTGOODS) ×2
IMMOBILIZER KNEE 20 THIGH 36 (SOFTGOODS) ×1 IMPLANT
KIT BASIN OR (CUSTOM PROCEDURE TRAY) ×2 IMPLANT
MANIFOLD NEPTUNE II (INSTRUMENTS) ×2 IMPLANT
NDL SAFETY ECLIPSE 18X1.5 (NEEDLE) ×1 IMPLANT
NEEDLE HYPO 18GX1.5 SHARP (NEEDLE) ×2
NS IRRIG 1000ML POUR BTL (IV SOLUTION) ×2 IMPLANT
PACK TOTAL JOINT (CUSTOM PROCEDURE TRAY) ×2 IMPLANT
PAD ABD 7.5X8 STRL (GAUZE/BANDAGES/DRESSINGS) ×2 IMPLANT
PADDING CAST COTTON 6X4 STRL (CAST SUPPLIES) ×6 IMPLANT
POSITIONER SURGICAL ARM (MISCELLANEOUS) ×2 IMPLANT
SET HNDPC FAN SPRY TIP SCT (DISPOSABLE) ×1 IMPLANT
SPONGE GAUZE 4X4 12PLY (GAUZE/BANDAGES/DRESSINGS) ×2 IMPLANT
STRIP CLOSURE SKIN 1/2X4 (GAUZE/BANDAGES/DRESSINGS) ×4 IMPLANT
SUCTION FRAZIER 12FR DISP (SUCTIONS) ×2 IMPLANT
SUT MNCRL AB 4-0 PS2 18 (SUTURE) ×2 IMPLANT
SUT VIC AB 2-0 CT1 27 (SUTURE) ×6
SUT VIC AB 2-0 CT1 TAPERPNT 27 (SUTURE) ×3 IMPLANT
SUT VLOC 180 0 24IN GS25 (SUTURE) ×2 IMPLANT
SYR 50ML LL SCALE MARK (SYRINGE) ×2 IMPLANT
TOWEL OR 17X26 10 PK STRL BLUE (TOWEL DISPOSABLE) ×4 IMPLANT
TRAY FOLEY CATH 14FRSI W/METER (CATHETERS) ×2 IMPLANT
WATER STERILE IRR 1500ML POUR (IV SOLUTION) ×3 IMPLANT
WRAP KNEE MAXI GEL POST OP (GAUZE/BANDAGES/DRESSINGS) ×3 IMPLANT

## 2013-01-14 NOTE — Plan of Care (Signed)
Problem: Consults Goal: Diagnosis- Total Joint Replacement Right total knee     

## 2013-01-14 NOTE — Op Note (Signed)
Pre-operative diagnosis- Osteoarthritis  Right knee(s)  Post-operative diagnosis- Osteoarthritis Right knee(s)  Procedure-  Right  Total Knee Arthroplasty  Surgeon- Gus Rankin. [place] Inthavong, MD  Assistant- Avel Peace, PA-C   Anesthesia-  Spinal EBL-* No blood loss amount entered *  Drains Hemovac  Tourniquet time-  Total Tourniquet Time Documented: Thigh (Right) - 36 minutes Total: Thigh (Right) - 36 minutes    Complications- None  Condition-PACU - hemodynamically stable.   Brief Clinical Note  Joshua [name] is a 72 y.o. year old male with end stage OA of his right knee with progressively worsening pain and dysfunction. He has constant pain, with activity and at rest and significant functional deficits with difficulties even with ADLs. He has had extensive non-op management including analgesics, injections of cortisone, and home exercise program, but remains in significant pain with significant dysfunction. Radiographs show bone on bone arthritis lateral and patellofemoral.He presents now for rightt Total Knee Arthroplasty.    Procedure in detail---   The patient is brought into the operating room and positioned supine on the operating table. After successful administration of  Spinal,   a tourniquet is placed high on the  Right thigh(s) and the lower extremity is prepped and draped in the usual sterile fashion. Time out is performed by the operating team and then the  Right lower extremity is wrapped in Esmarch, knee flexed and the tourniquet inflated to 300 mmHg.       A midline incision is made with a ten blade through the subcutaneous tissue to the level of the extensor mechanism. A fresh blade is used to make a medial parapatellar arthrotomy. Soft tissue over the proximal medial tibia is subperiosteally elevated to the joint line with a knife and into the semimembranosus bursa with a Cobb elevator. Soft tissue over the proximal lateral tibia is elevated with attention being paid to  avoiding the patellar tendon on the tibial tubercle. The patella is everted, knee flexed 90 degrees and the ACL and PCL are removed. Findings are bone on bone lateral and patellofemoral with large lateral osteophytes.        The drill is used to create a starting hole in the distal femur and the canal is thoroughly irrigated with sterile saline to remove the fatty contents. The 5 degree Right  valgus alignment guide is placed into the femoral canal and the distal femoral cutting block is pinned to remove 10 mm off the distal femur. Resection is made with an oscillating saw.      The tibia is subluxed forward and the menisci are removed. The extramedullary alignment guide is placed referencing proximally at the medial aspect of the tibial tubercle and distally along the second metatarsal axis and tibial crest. The block is pinned to remove 2mm off the more deficient lateral  side. Resection is made with an oscillating saw. Size 5is the most appropriate size for the tibia and the proximal tibia is prepared with the modular drill and keel punch for that size.      The femoral sizing guide is placed and size 4 is most appropriate. Rotation is marked off the epicondylar axis and confirmed by creating a rectangular flexion gap at 90 degrees. The size 4 cutting block is pinned in this rotation and the anterior, posterior and chamfer cuts are made with the oscillating saw. The intercondylar block is then placed and that cut is made.      Trial size 5 tibial component, trial size 4 posterior stabilized femur  and a 12.5  mm posterior stabilized rotating platform insert trial is placed. Full extension is achieved with excellent varus/valgus and anterior/posterior balance throughout full range of motion. The patella is everted and thickness measured to be 27  mm. Free hand resection is taken to 15 mm, a 41 template is placed, lug holes are drilled, trial patella is placed, and it tracks normally. Osteophytes are removed off  the posterior femur with the trial in place. All trials are removed and the cut bone surfaces prepared with pulsatile lavage. Cement is mixed and once ready for implantation, the size 5 tibial implant, size  4 posterior stabilized femoral component, and the size 41 patella are cemented in place and the patella is held with the clamp. The trial insert is placed and the knee held in full extension. The Exparel (20 ml mixed with 50 ml saline) is injected into the extensor mechanism, posterior capsule, medial and lateral gutters and subcutaneous tissues.  All extruded cement is removed and once the cement is hard the permanent 12.5 mm posterior stabilized rotating platform insert is placed into the tibial tray.      The wound is copiously irrigated with saline solution and the extensor mechanism closed over a hemovac drain with #1 PDS suture. The tourniquet is released for a total tourniquet time of 36  minutes. Flexion against gravity is 140 degrees and the patella tracks normally. Subcutaneous tissue is closed with 2.0 vicryl and subcuticular with running 4.0 Monocryl. The incision is cleaned and dried and steri-strips and a bulky sterile dressing are applied. The limb is placed into a knee immobilizer and the patient is awakened and transported to recovery in stable condition.      Please note that a surgical assistant was a medical necessity for this procedure in order to perform it in a safe and expeditious manner. Surgical assistant was necessary to retract the ligaments and vital neurovascular structures to prevent injury to them and also necessary for proper positioning of the limb to allow for anatomic placement of the prosthesis.   Gus Rankin Layn Kye, MD    01/14/2013, 3:37 PM

## 2013-01-14 NOTE — Interval H&P Note (Signed)
History and Physical Interval Note:  01/14/2013 11:59 AM  Joshua [name]  has presented today for surgery, with the diagnosis of OSTEOARTHRITIS RIGHT KNEE  The various methods of treatment have been discussed with the patient and family. After consideration of risks, benefits and other options for treatment, the patient has consented to  Procedure(s): TOTAL KNEE ARTHROPLASTY (Right) as a surgical intervention .  The patient's history has been reviewed, patient examined, no change in status, stable for surgery.  I have reviewed the patient's chart and labs.  Questions were answered to the patient's satisfaction.     Loanne Drilling

## 2013-01-14 NOTE — Preoperative (Signed)
Beta Blockers   Reason not to administer Beta Blockers:Not Applicable 

## 2013-01-14 NOTE — Progress Notes (Signed)
Pt placed on Auto BiPAP because Pt did not know home settings, 2 LPM O2 bled in as well, pt tolerating well at this time, RT to monitor and assess as needed.

## 2013-01-14 NOTE — H&P (View-Only) (Signed)
Joshua [name]  DOB: 12/12/1940 Married / Language: English / Race: White Male  Date of Admission:  01/14/2013  Chief Complaint:  Right Knee Pain  History of Present Illness The patient is a 71 year old male who comes in for a preoperative History and Physical. The patient is scheduled for a right total knee arthroplasty to be performed by Dr. Frank V. Aluisio, MD at [facility] on 01/14/2013. The patient is a 71 year old male who presents for follow up of their knee. The patient is being followed for their bilateral knee pain and osteoarthritis. Symptoms reported today include: pain and aching. The patient feels that they are doing poorly and report their pain level to be mild to moderate. Current treatment includes: home exercise program. The following medication has been used for pain control: none. The patient has reported improvement of their symptoms with: physical therapy. The patient states that his knee is getting progressively worse over time. The physical therapy made him feel stronger but he still has considerable pain. He is an avid outdoorsman and is having a harder time doing things he desires. The right knee is worse than the left knee but both knees bother him. He is at a stage now where he wants to do something with the right knee in order to get back to his regular activity level. He elects to proceed with surgery. They have been treated conservatively in the past for the above stated problem and despite conservative measures, they continue to have progressive pain and severe functional limitations and dysfunction. They have failed non-operative management including home exercise, medications, and injections. It is felt that they would benefit from undergoing total joint replacement. Risks and benefits of the procedure have been discussed with the patient and they elect to proceed with surgery. There are no active contraindications to surgery such as ongoing  infection or rapidly progressive neurological disease.   Problem List Osteoarthritis, knee (715.96)   Allergies No Known Drug Allergies. 07/24/2012   Family History Hypertension. father   Social History Living situation. live with spouse Marital status. married Exercise. Exercises never Illicit drug use. no Pain Contract. no Tobacco / smoke exposure. yes Tobacco use. current some days smoker Drug/Alcohol Rehab (Previously). no Current work status. working part time Drug/Alcohol Rehab (Currently). no Alcohol use. current drinker; drinks hard liquor; only occasionally per week Children. 1 Post-Surgical Plans. Plan is to go to [facility].   Medication History Atenolol ( Oral) Specific dose unknown - Active. Digoxin (0.25MG Tablet, Oral) Active. Lisinopril (20MG Tablet, Oral) Active. Niacin ER (500MG Capsule ER, Oral) Active. Omega 3 (1000MG Capsule, Oral) Active. Warfarin Sodium (10MG Tablet, Oral) Active. AmLODIPine Besylate (5MG Tablet, Oral) Active.  Past Surgical History No pertinent past surgical history   Medical History High blood pressure Sleep Apnea. uses CPAP Hypercholesterolemia Atrial Fibrillation. Chronic Measles Mumps   Review of Systems General:Present- Night Sweats. Not Present- Chills, Fever, Fatigue, Weight Gain, Weight Loss and Memory Loss. Skin:Not Present- Hives, Itching, Rash, Eczema and Lesions. HEENT:Not Present- Tinnitus, Headache, Double Vision, Visual Loss, Hearing Loss and Dentures. Respiratory:Present- Shortness of breath with exertion. Not Present- Shortness of breath at rest, Allergies, Coughing up blood and Chronic Cough. Cardiovascular:Not Present- Chest Pain, Racing/skipping heartbeats, Difficulty Breathing Lying Down, Murmur, Swelling and Palpitations. Gastrointestinal:Not Present- Bloody Stool, Heartburn, Abdominal Pain, Vomiting, Nausea, Constipation, Diarrhea, Difficulty Swallowing,  Jaundice and Loss of appetitie. Male Genitourinary:Present- Nocturia and Urinating at Night. Not Present- Urinary frequency, Blood in Urine, Weak urinary   stream, Discharge, Flank Pain, Incontinence, Painful Urination, Urgency and Urinary Retention. Musculoskeletal:Present- Joint Pain. Not Present- Muscle Weakness, Muscle Pain, Joint Swelling, Back Pain, Morning Stiffness and Spasms. Neurological:Not Present- Tremor, Dizziness, Blackout spells, Paralysis, Difficulty with balance and Weakness. Psychiatric:Not Present- Insomnia.   Vitals Weight: 230 lb Height: 70 in Body Surface Area: 2.27 m Body Mass Index: 33 kg/m Pulse: 68 (Irregular) Resp.: 14 (Unlabored) BP: 132/82 (Sitting, Right Arm, Standard)    Physical Exam The physical exam findings are as follows:  Note: Patient is a 71 year old male with continued knee pain.   General Mental Status - Alert, cooperative and good historian. General Appearance- pleasant. Not in acute distress. Orientation- Oriented X3. Build & Nutrition- Well nourished and Well developed.   Head and Neck Head- normocephalic, atraumatic . Neck Global Assessment- supple. no bruit auscultated on the right and no bruit auscultated on the left.   Eye Vision- Wears corrective lenses. Pupil- Bilateral- Regular and Round. Motion- Bilateral- EOMI.   Chest and Lung Exam Auscultation: Breath sounds:- clear at anterior chest wall and - clear at posterior chest wall. Adventitious sounds:- No Adventitious sounds.   Cardiovascular Auscultation:Rhythm- Regular rate and rhythm. Heart Sounds- S1 WNL and S2 WNL. Murmurs & Other Heart Sounds:Auscultation of the heart reveals - No Murmurs.   Abdomen Inspection:Contour- Generalized moderate distention. Palpation/Percussion:Tenderness- Abdomen is non-tender to palpation. Rigidity (guarding)- Abdomen is soft. Auscultation:Auscultation of the abdomen reveals - Bowel  sounds normal.   Male Genitourinary Not done, not pertinent to present illness  Musculoskeletal On exam well developed male, alert and oriented in no apparent distress. Both hips, normal range of motion and no discomfort. Both knees show no effusion. He has slight varus both knees. Range about 5 to 120 each side with tenderness medial greater than lateral and no instability noted. Pulses sensation motor intact.  RADIOGRAPHS: AP both knees and lateral show moderate to advanced arthritic changes in both knees medial and patellofemoral compartments with bone on bone changes.  Assessment & Plan Osteoarthritis, knee (715.96) Impression: Right Knee  Note: Plan is for a Right Total Knee Replacement by Dr. Aluisio.  Plan is to go to [facility] after the hospital stay.  PCP - Dr. Roy Fagan - Patient has been seen preoperatively and felt to be stable for surgery. The patient was recommended to stop his Coumadin 4 days prior to surgery as per Dr. Fagan.  Signed electronically by DREW L PERKINS, PA-C  

## 2013-01-14 NOTE — Transfer of Care (Signed)
Immediate Anesthesia Transfer of Care Note  Patient: Joshua [name]  Procedure(s) Performed: Procedure(s): TOTAL KNEE ARTHROPLASTY (Right)  Patient Location: PACU  Anesthesia Type:Spinal  Level of Consciousness: awake, alert , oriented and patient cooperative  Airway & Oxygen Therapy: Patient Spontanous Breathing and Patient connected to nasal cannula oxygen  Post-op Assessment: Report given to PACU RN, Post -op Vital signs reviewed and stable and Patient moving all extremities  Post vital signs: Reviewed and stable  Complications: No apparent anesthesia complications

## 2013-01-14 NOTE — Anesthesia Preprocedure Evaluation (Addendum)
Anesthesia Evaluation  Patient identified by MRN, date of birth, ID band Patient awake    Reviewed: Allergy & Precautions, H&P , NPO status , Patient's Chart, lab work & pertinent test results  Airway Mallampati: II TM Distance: >3 FB Neck ROM: Full    Dental no notable dental hx. (+) Caps   Pulmonary sleep apnea and Continuous Positive Airway Pressure Ventilation ,  breath sounds clear to auscultation  Pulmonary exam normal       Cardiovascular hypertension, Pt. on medications + dysrhythmias Atrial Fibrillation Rhythm:Regular Rate:Normal     Neuro/Psych negative neurological ROS  negative psych ROS   GI/Hepatic negative GI ROS, Neg liver ROS,   Endo/Other  negative endocrine ROS  Renal/GU negative Renal ROS  negative genitourinary   Musculoskeletal negative musculoskeletal ROS (+)   Abdominal   Peds negative pediatric ROS (+)  Hematology negative hematology ROS (+)   Anesthesia Other Findings Upper front R cap  Reproductive/Obstetrics negative OB ROS                          Anesthesia Physical Anesthesia Plan  ASA: III  Anesthesia Plan: Spinal   Post-op Pain Management:    Induction:   Airway Management Planned: Simple Face Mask  Additional Equipment:   Intra-op Plan:   Post-operative Plan:   Informed Consent: I have reviewed the patients History and Physical, chart, labs and discussed the procedure including the risks, benefits and alternatives for the proposed anesthesia with the patient or authorized representative who has indicated his/her understanding and acceptance.   Dental advisory given  Plan Discussed with: CRNA  Anesthesia Plan Comments:         Anesthesia Quick Evaluation

## 2013-01-14 NOTE — Progress Notes (Signed)
ANTICOAGULATION CONSULT NOTE - Initial Consult  Pharmacy Consult for Warfarin Indication: VTE prophylaxis  No Known Allergies  Patient Measurements:   Heparin Dosing Weight:   Vital Signs: Temp: 97.4 F (36.3 C) (03/24 1656) Temp src: Oral (03/24 1100) BP: 128/87 mmHg (03/24 1656) Pulse Rate: 100 (03/24 1656)  Labs:  Recent Labs  01/14/13 1130  LABPROT 13.7  INR 1.06    The CrCl is unknown because both a height and weight (above a minimum accepted value) are required for this calculation.   Medical History: Past Medical History  Diagnosis Date  . Hypertension   . Hyperlipidemia   . Degenerative joint disease     Right knee  . Chronic anticoagulation   . Sleep apnea     Rx-CPAP  . Atrial fibrillation 06/2010  . Elevated cholesterol   . Shortness of breath     on exertion  . Nocturia   . BPH (benign prostatic hyperplasia)     Medications:  Scheduled:  . acetaminophen  1,000 mg Intravenous Q6H  . [START ON 01/15/2013] amLODipine  5 mg Oral Daily  . atenolol  100 mg Oral BID  . [COMPLETED]  ceFAZolin (ANCEF) IV  2 g Intravenous On Call to OR  .  ceFAZolin (ANCEF) IV  2 g Intravenous Q6H  . [START ON 01/15/2013] dexamethasone  10 mg Oral Once   Or  . [START ON 01/15/2013] dexamethasone  10 mg Intravenous Once  . [START ON 01/15/2013] digoxin  0.25 mg Oral QAC breakfast  . docusate sodium  100 mg Oral BID  . [START ON 01/15/2013] enoxaparin (LOVENOX) injection  30 mg Subcutaneous Q12H  . niacin  500 mg Oral Daily  . simvastatin  20 mg Oral q1800  . [DISCONTINUED] acetaminophen  1,000 mg Intravenous Once  . [DISCONTINUED] bupivacaine liposome  20 mL Infiltration Once   Infusions:  . sodium chloride    . [DISCONTINUED] sodium chloride    . [DISCONTINUED] lactated ringers    . [DISCONTINUED] lactated ringers     PRN: acetaminophen, acetaminophen, bisacodyl, diphenhydrAMINE, menthol-cetylpyridinium, methocarbamol, metoCLOPramide (REGLAN) injection,  metoCLOPramide, morphine injection, ondansetron (ZOFRAN) IV, ondansetron, oxyCODONE, phenol, polyethylene glycol, sodium phosphate, traMADol, [DISCONTINUED] 0.9 % irrigation (POUR BTL), [DISCONTINUED] bupivacaine liposome (EXPAREL 1.3 %) with 0.9 % sodium chloride inj [DISCONTINUED]  HYDROmorphone (DILAUDID) injection, [DISCONTINUED] meperidine (DEMEROL) injection, [DISCONTINUED] methocarbamol (ROBAXIN) IV, [DISCONTINUED] promethazine, [DISCONTINUED] sodium chloride irrigation, [DISCONTINUED] sterile water for irrigation  Assessment: 72 yo M s/p Right TKA Patient has been taking Warfarin 10mg  daily except 5mg  on Wednesday.  Goal of Therapy:  INR 2-3    Plan:  Begin Warfarin 12mg  po x 1 at 1800 PT/INR daily Coumadin book/Video     Loletta Specter 01/14/2013,5:24 PM

## 2013-01-14 NOTE — Anesthesia Procedure Notes (Signed)
Spinal  Patient location during procedure: OR Start time: 01/14/2013 2:15 PM Staffing CRNA/Resident: Phillips Grout E Performed by: resident/CRNA  Preanesthetic Checklist Completed: patient identified, site marked, surgical consent, pre-op evaluation, timeout performed, IV checked, risks and benefits discussed and monitors and equipment checked Spinal Block Patient position: sitting Prep: Betadine Patient monitoring: heart rate, cardiac monitor, continuous pulse ox and blood pressure Approach: right paramedian Location: L2-3 Injection technique: single-shot Needle Needle type: Spinocan  Needle gauge: 22 G Needle length: 9 cm Additional Notes Kit no. [id number] expiration date checked, Pt tolerated well no complaints, neg. Heme, paresthesia, clear csf

## 2013-01-14 NOTE — Anesthesia Postprocedure Evaluation (Signed)
  Anesthesia Post-op Note  Patient: Joshua [name]  Procedure(s) Performed: Procedure(s) (LRB): TOTAL KNEE ARTHROPLASTY (Right)  Patient Location: PACU  Anesthesia Type: Spinal  Level of Consciousness: awake and alert   Airway and Oxygen Therapy: Patient Spontanous Breathing  Post-op Pain: mild  Post-op Assessment: Post-op Vital signs reviewed, Patient's Cardiovascular Status Stable, Respiratory Function Stable, Patent Airway and No signs of Nausea or vomiting  Last Vitals:  Filed Vitals:   01/14/13 1630  BP: 115/75  Pulse: 71  Temp:   Resp: 18    Post-op Vital Signs: stable   Complications: No apparent anesthesia complications

## 2013-01-15 ENCOUNTER — Encounter (HOSPITAL_COMMUNITY): Payer: Self-pay | Admitting: Orthopedic Surgery

## 2013-01-15 DIAGNOSIS — E871 Hypo-osmolality and hyponatremia: Secondary | ICD-10-CM

## 2013-01-15 LAB — CBC
HCT: 33.7 % — ABNORMAL LOW (ref 39.0–52.0)
Hemoglobin: 11.8 g/dL — ABNORMAL LOW (ref 13.0–17.0)
MCH: 31.9 pg (ref 26.0–34.0)
MCHC: 35 g/dL (ref 30.0–36.0)
MCV: 91.1 fL (ref 78.0–100.0)

## 2013-01-15 LAB — BASIC METABOLIC PANEL
BUN: 10 mg/dL (ref 6–23)
Calcium: 8.2 mg/dL — ABNORMAL LOW (ref 8.4–10.5)
GFR calc non Af Amer: >90 mL/min (ref >90)
Glucose, Bld: 147 mg/dL — ABNORMAL HIGH (ref 70–99)

## 2013-01-15 MED ORDER — WARFARIN SODIUM 10 MG PO TABS
10.0000 mg | ORAL_TABLET | Freq: Once | ORAL | Status: AC
  Administered 2013-01-15: 10 mg via ORAL
  Filled 2013-01-15: qty 1

## 2013-01-15 NOTE — Progress Notes (Signed)
ANTICOAGULATION CONSULT NOTE - Follow Up Consult  Pharmacy Consult for Warfarin Indication: VTE prophylaxis, Chronic Afib  No Known Allergies  Patient Measurements: Height: 5\' 11"  (180.3 cm) Weight: 240 lb (108.863 kg) IBW/kg (Calculated) : 75.3  Vital Signs: Temp: 98.4 F (36.9 C) (03/25 0914) Temp src: Oral (03/25 0914) BP: 130/80 mmHg (03/25 0914) Pulse Rate: 69 (03/25 0914)  Labs:  Recent Labs  01/14/13 1130 01/15/13 0453  HGB  --  11.8*  HCT  --  33.7*  PLT  --  103*  LABPROT 13.7 14.8  INR 1.06 1.18  CREATININE  --  0.76    Estimated Creatinine Clearance: 107.8 ml/min (by C-G formula based on Cr of 0.76).   Medications:  Scheduled:  . acetaminophen  1,000 mg Intravenous Q6H  . amLODipine  5 mg Oral Daily  . atenolol  100 mg Oral BID  . [COMPLETED]  ceFAZolin (ANCEF) IV  2 g Intravenous Q6H  . coumadin book   Does not apply Once  . [COMPLETED] dexamethasone  10 mg Oral Once   Or  . [COMPLETED] dexamethasone  10 mg Intravenous Once  . digoxin  0.25 mg Oral QAC breakfast  . docusate sodium  100 mg Oral BID  . enoxaparin (LOVENOX) injection  30 mg Subcutaneous Q12H  . niacin  500 mg Oral Daily  . simvastatin  20 mg Oral q1800  . warfarin  10 mg Oral ONCE-1800  . [COMPLETED] warfarin  12 mg Oral Once  . warfarin   Does not apply Once  . Warfarin - Pharmacist Dosing Inpatient   Does not apply q1800  . [DISCONTINUED] acetaminophen  1,000 mg Intravenous Once   Infusions:  . sodium chloride 20 mL/hr at 01/15/13 0845  . [DISCONTINUED] sodium chloride    . [DISCONTINUED] lactated ringers    . [DISCONTINUED] lactated ringers      Assessment:  72 yo male s/p right TKA on 3/24  Chronic warfarin for Afib, home dose is 10mg  daily except 5mg  on Wednesdays, last dose 3/19 in anticipation of surgery  Warfarin resumed 3/24 post-op, given boosted dose of 12mg   INR responding, CBC ok, no bleeding/complications reported.  Goal of Therapy:  INR 2-3 Monitor  platelets by anticoagulation protocol: Yes   Plan:   Warfarin 10mg  po today  Continue Lovenox 30mg  q12h until INR > or = 1.8  Daily PT/INR  Loralee Pacas, PharmD, BCPS Pager: [phone] 01/15/2013,2:53 PM

## 2013-01-15 NOTE — Progress Notes (Deleted)
ANTICOAGULATION CONSULT NOTE - Follow Up Consult  Pharmacy Consult for Warfarin Indication: VTE prophylaxis, Chronic Afib  No Known Allergies  Patient Measurements: Height: 5\' 11"  (180.3 cm) Weight: 240 lb (108.863 kg) IBW/kg (Calculated) : 75.3  Vital Signs: Temp: 99.4 F (37.4 C) (03/25 0552) Temp src: Oral (03/25 0552) BP: 126/80 mmHg (03/25 0552) Pulse Rate: 82 (03/25 0552)  Labs:  Recent Labs  01/14/13 1130 01/15/13 0453  HGB  --  11.8*  HCT  --  33.7*  PLT  --  103*  LABPROT 13.7 14.8  INR 1.06 1.18  CREATININE  --  0.76    Estimated Creatinine Clearance: 107.8 ml/min (by C-G formula based on Cr of 0.76).   Medications:  Scheduled:  . acetaminophen  1,000 mg Intravenous Q6H  . amLODipine  5 mg Oral Daily  . atenolol  100 mg Oral BID  . [COMPLETED]  ceFAZolin (ANCEF) IV  2 g Intravenous On Call to OR  . [COMPLETED]  ceFAZolin (ANCEF) IV  2 g Intravenous Q6H  . coumadin book   Does not apply Once  . [COMPLETED] dexamethasone  10 mg Oral Once   Or  . [COMPLETED] dexamethasone  10 mg Intravenous Once  . digoxin  0.25 mg Oral QAC breakfast  . docusate sodium  100 mg Oral BID  . enoxaparin (LOVENOX) injection  30 mg Subcutaneous Q12H  . niacin  500 mg Oral Daily  . simvastatin  20 mg Oral q1800  . [COMPLETED] warfarin  12 mg Oral Once  . warfarin   Does not apply Once  . Warfarin - Pharmacist Dosing Inpatient   Does not apply q1800  . [DISCONTINUED] acetaminophen  1,000 mg Intravenous Once  . [DISCONTINUED] bupivacaine liposome  20 mL Infiltration Once   Infusions:  . sodium chloride 20 mL/hr at 01/15/13 0845  . [DISCONTINUED] sodium chloride    . [DISCONTINUED] lactated ringers    . [DISCONTINUED] lactated ringers      Assessment:  72 yo male s/p right TKA on 3/24  Chronic warfarin for Afib, home dose is 10mg  daily except 5mg  on Wednesdays, last dose 3/19 in anticipation of surgery  Warfarin resumed 3/24 post-op, given boosted dose of  12mg   INR responding, CBC ok, no bleeding/complications reported.  Goal of Therapy:  INR 2-3 Monitor platelets by anticoagulation protocol: Yes   Plan:   Warfarin 10mg  po today  Continue Lovenox 30mg  q12h until INR > or = 1.8  Daily PT/INR  Loralee Pacas, PharmD, BCPS Pager: [phone] 01/15/2013,9:12 AM

## 2013-01-15 NOTE — Progress Notes (Deleted)
Clinical Social Work Department CLINICAL SOCIAL WORK PLACEMENT NOTE 01/15/2013  Patient:  Joshua [name],Joshua [name]  Account Number:  [id number] Admit date:  01/14/2013  Clinical Social Worker:  Andra Heslin, LCSW  Date/time:  01/15/2013 02:32 PM  Clinical Social Work is seeking post-discharge placement for this patient at the following level of care:   SKILLED NURSING   (*CSW will update this form in Epic as items are completed)     Patient/family provided with [facility] Department of Clinical Social Work's list of facilities offering this level of care within the geographic area requested by the patient (or if unable, by the patient's family).  01/15/2013  Patient/family informed of their freedom to choose among providers that offer the needed level of care, that participate in Medicare, Medicaid or managed care program needed by the patient, have an available bed and are willing to accept the patient.    Patient/family informed of [facility]' ownership interest in [facility], as well as of the fact that they are under no obligation to receive care at this facility.  PASARR submitted to EDS on 01/15/2013 PASARR number received from EDS on 01/15/2013  FL2 transmitted to all facilities in geographic area requested by pt/family on  01/15/2013 FL2 transmitted to all facilities within larger geographic area on   Patient informed that his/her managed care company has contracts with or will negotiate with  certain facilities, including the following:     Patient/family informed of bed offers received:  01/15/2013 Patient chooses bed at  Physician recommends and patient chooses bed at    Patient to be transferred to  on   Patient to be transferred to facility by   The following physician request were entered in Epic:   Additional Comments: Jayani Rozman LCSW [phone] 

## 2013-01-15 NOTE — Progress Notes (Signed)
Clinical Social Work Department BRIEF PSYCHOSOCIAL ASSESSMENT 01/15/2013  Patient:  Joshua [name]     Account Number:  [id number]     Admit date:  01/14/2013  Clinical Social Worker:  Candie Chroman  Date/Time:  01/15/2013 02:42 PM  Referred by:  Physician  Date Referred:  01/15/2013 Referred for  SNF Placement   Other Referral:   Interview type:  Patient Other interview type:    PSYCHOSOCIAL DATA Living Status:  WIFE Admitted from facility:   Level of care:   Primary support name:  Neomia Dear Primary support relationship to patient:  SPOUSE Degree of support available:   unclear    CURRENT CONCERNS Current Concerns  Post-Acute Placement   Other Concerns:    SOCIAL WORK ASSESSMENT / PLAN Pt is [name] 72 yr old gentleman living at home prior to hospitalization.CSW met with pt to assist with d/c planning. Pt has been to [facility] for rehab in the past and would like to return following hospital d/c. CSW has left [name] message for [facility] requesting placement for pt. Awaiting return call.SNF serach in [place] initiated. Bed offer to be provided.   Assessment/plan status:  Psychosocial Support/Ongoing Assessment of Needs Other assessment/ plan:   Information/referral to community resources:   SNF list with bed offers to be provided.    PATIENT'S/FAMILY'S RESPONSE TO PLAN OF CARE: Pt would like to return to [facility] for [facility].   Joshua Razor LCSW [phone]

## 2013-01-15 NOTE — Progress Notes (Signed)
Utilization review completed.  

## 2013-01-15 NOTE — Evaluation (Signed)
Physical Therapy Evaluation Patient Details Name: Joshua [name] MRN: [id number] DOB: [birth year] Today's Date: 01/15/2013 Time: 4098-1191 PT Time Calculation (min): 11 min  PT Assessment / Plan / Recommendation Clinical Impression  72 yo male s/p R TKA. Mobilizing well. On eval, pt required Min assist for mobility. Able to ambulate ~55 feet with RW. Pt states plan is for [facility] for rehab. Recommend SNF to improve strength, ROM, activity tolerance, gait and balance.     PT Assessment  Patient needs continued PT services    Follow Up Recommendations  SNF    Does the patient have the potential to tolerate intense rehabilitation      Barriers to Discharge        Equipment Recommendations  None recommended by PT    Recommendations for Other Services OT consult   Frequency 7X/week    Precautions / Restrictions Precautions Precautions: Knee;Fall Required Braces or Orthoses: Knee Immobilizer - Right Knee Immobilizer - Right: Discontinue once straight leg raise with < 10 degree lag Restrictions Weight Bearing Restrictions: No RLE Weight Bearing: Weight bearing as tolerated   Pertinent Vitals/Pain 5/10 R knee with activity      Mobility  Bed Mobility Details for Bed Mobility Assistance: pt sitting in recliner. Transfers Transfers: Sit to Stand;Stand to Sit Sit to Stand: 4: Min assist;From chair/3-in-1;With armrests Stand to Sit: 4: Min assist;To chair/3-in-1;With armrests Details for Transfer Assistance: VCS safety, technique, hand placement. Assist to rise, stabilize, control descent Ambulation/Gait Ambulation/Gait Assistance: 4: Min assist Ambulation Distance (Feet): 55 Feet Assistive device: Rolling walker Ambulation/Gait Assistance Details: VCS safety, technique, sequence, step length. Slow gait speed.  Gait Pattern: Step-to pattern;Decreased step length - right;Decreased step length - left;Decreased stride length;Antalgic    Exercises     PT Diagnosis:  Difficulty walking;Abnormality of gait;Acute pain  PT Problem List: Decreased strength;Decreased range of motion;Decreased activity tolerance;Decreased mobility;Decreased knowledge of use of DME;Decreased knowledge of precautions;Pain PT Treatment Interventions: DME instruction;Gait training;Functional mobility training;Therapeutic activities;Therapeutic exercise;Patient/family education   PT Goals Acute Rehab PT Goals PT Goal Formulation: With patient Time For Goal Achievement: 01/22/13 Potential to Achieve Goals: Good Pt will go Supine/Side to Sit: with supervision PT Goal: Supine/Side to Sit - Progress: Goal set today Pt will go Sit to Supine/Side: with supervision PT Goal: Sit to Supine/Side - Progress: Goal set today Pt will go Sit to Stand: with supervision PT Goal: Sit to Stand - Progress: Goal set today Pt will Ambulate: 51 - 150 feet;with supervision;with rolling walker PT Goal: Ambulate - Progress: Goal set today Pt will Perform Home Exercise Program: with supervision, verbal cues required/provided PT Goal: Perform Home Exercise Program - Progress: Goal set today  Visit Information  Last PT Received On: 01/15/13 Assistance Needed: +1    Subjective Data  Subjective: Im not having much pain Patient Stated Goal: rehab-[facility]   Prior Functioning  Home Living Lives With: Spouse Available Help at Discharge: Family Type of Home: House Home Layout: Two level;Bed/bath upstairs Home Adaptive Equipment: Walker - rolling;Straight cane;Bedside commode/3-in-1 Additional Comments: borrowed equipment per pt.  Prior Function Level of Independence: Independent Able to Take Stairs?: Yes Communication Communication: No difficulties    Cognition  Cognition Overall Cognitive Status: Appears within functional limits for tasks assessed/performed Arousal/Alertness: Awake/alert Orientation Level: Appears intact for tasks assessed Behavior During Session: [facility] for tasks performed     Extremity/Trunk Assessment Right Lower Extremity Assessment RLE ROM/Strength/Tone: Deficits RLE ROM/Strength/Tone Deficits: hip flex 2/5, moves ankle well Left Lower Extremity  Assessment LLE ROM/Strength/Tone: WFL for tasks assessed Trunk Assessment Trunk Assessment: Normal   Balance    End of Session PT - End of Session Equipment Utilized During Treatment: Gait belt;Right knee immobilizer Activity Tolerance: Patient tolerated treatment well Patient left: in chair;with call bell/phone within reach  GP     [name] Alert, MPT Pager: [phone]

## 2013-01-15 NOTE — Progress Notes (Signed)
Clinical Social Work Department CLINICAL SOCIAL WORK PLACEMENT NOTE 01/15/2013  Patient:  RIHAAN, BARRACK A  Account Number:  [id number] Admit date:  01/14/2013  Clinical Social Worker:  Cori Razor, LCSW  Date/time:  01/15/2013 02:56 PM  Clinical Social Work is seeking post-discharge placement for this patient at the following level of care:   SKILLED NURSING   (*CSW will update this form in Epic as items are completed)     Patient/family provided with [facility] Department of Clinical Social Work's list of facilities offering this level of care within the geographic area requested by the patient (or if unable, by the patient's family).  01/15/2013  Patient/family informed of their freedom to choose among providers that offer the needed level of care, that participate in Medicare, Medicaid or managed care program needed by the patient, have an available bed and are willing to accept the patient.  01/15/2013  Patient/family informed of [facility]' ownership interest in [facility], as well as of the fact that they are under no obligation to receive care at this facility.  PASARR submitted to EDS on 01/15/2013 PASARR number received from EDS on 01/15/2013  FL2 transmitted to all facilities in geographic area requested by pt/family on  01/15/2013 FL2 transmitted to all facilities within larger geographic area on   Patient informed that his/her managed care company has contracts with or will negotiate with  certain facilities, including the following:     Patient/family informed of bed offers received:   Patient chooses bed at  Physician recommends and patient chooses bed at    Patient to be transferred to  on   Patient to be transferred to facility by   The following physician request were entered in Epic:   Additional Comments:  Cori Razor LCSW [phone]

## 2013-01-15 NOTE — Progress Notes (Signed)
RT set patient up on auto titrate BiPap from 6cm H2O min to 20cm H2O max. Patient has on home full face mask and tubing. He states he is comfortable will call if he needs any further assistance.

## 2013-01-15 NOTE — Progress Notes (Signed)
   Subjective: 1 Day Post-Op Procedure(s) (LRB): TOTAL KNEE ARTHROPLASTY (Right) Patient reports pain as mild.   Patient seen in rounds with Dr. Lequita Halt.  Doing pretty well today. Patient is well, and has had no acute complaints or problems We will start therapy today.  Plan is to go Skilled nursing facility at [facility] after hospital stay.  Objective: Vital signs in last 24 hours: Temp:  [97.4 F (36.3 C)-99.4 F (37.4 C)] 99.4 F (37.4 C) (03/25 0552) Pulse Rate:  [71-100] 82 (03/25 0552) Resp:  [16-20] 16 (03/25 0552) BP: (115-150)/(65-109) 126/80 mmHg (03/25 0552) SpO2:  [96 %-100 %] 98 % (03/25 0552) Weight:  [108.863 kg (240 lb)] 108.863 kg (240 lb) (03/24 1700)  Intake/Output from previous day:  Intake/Output Summary (Last 24 hours) at 01/15/13 0814 Last data filed at 01/15/13 0804  Gross per 24 hour  Intake 3876.67 ml  Output   2456 ml  Net 1420.67 ml    Intake/Output this shift: UOP 905 since MN +1420  Labs:  Recent Labs  01/15/13 0453  HGB 11.8*    Recent Labs  01/15/13 0453  WBC 8.0  RBC 3.70*  HCT 33.7*  PLT 103*    Recent Labs  01/15/13 0453  NA 133*  K 4.1  CL 99  CO2 26  BUN 10  CREATININE 0.76  GLUCOSE 147*  CALCIUM 8.2*    Recent Labs  01/14/13 1130 01/15/13 0453  INR 1.06 1.18    EXAM General - Patient is Alert, Appropriate and Oriented Extremity - Neurovascular intact Sensation intact distally Dorsiflexion/Plantar flexion intact Dressing - dressing C/D/I Motor Function - intact, moving foot and toes well on exam.  Hemovac pulled without difficulty.  Past Medical History  Diagnosis Date  . Hypertension   . Hyperlipidemia   . Degenerative joint disease     Right knee  . Chronic anticoagulation   . Sleep apnea     Rx-CPAP  . Atrial fibrillation 06/2010  . Elevated cholesterol   . Shortness of breath     on exertion  . Nocturia   . BPH (benign prostatic hyperplasia)     Assessment/Plan: 1 Day Post-Op  Procedure(s) (LRB): TOTAL KNEE ARTHROPLASTY (Right) Principal Problem:   OA (osteoarthritis) of knee Active Problems:   Postop Hyponatremia  Estimated body mass index is 33.49 kg/(m^2) as calculated from the following:   Height as of this encounter: 5\' 11"  (1.803 m).   Weight as of this encounter: 108.863 kg (240 lb). Advance diet Up with therapy Discharge to SNF [facility]  DVT Prophylaxis - Lovenox and Coumadin Weight-Bearing as tolerated to right leg No vaccines. D/C O2 and Pulse OX and try on Room Air  PERKINS, Marlowe Sax 01/15/2013, 8:14 AM

## 2013-01-15 NOTE — Progress Notes (Signed)
Physical Therapy Treatment Patient Details Name: Joshua [name] MRN: [id number] DOB: February 12, 1941 Today's Date: 01/15/2013 Time: 4098-1191 PT Time Calculation (min): 27 min  PT Assessment / Plan / Recommendation Comments on Treatment Session  Progressing well with mobility. Continue to recommend SNF.     Follow Up Recommendations  SNF     Does the patient have the potential to tolerate intense rehabilitation     Barriers to Discharge        Equipment Recommendations  None recommended by PT    Recommendations for Other Services OT consult  Frequency 7X/week   Plan Discharge plan remains appropriate    Precautions / Restrictions Precautions Precautions: Knee;Fall Required Braces or Orthoses: Knee Immobilizer - Right Knee Immobilizer - Right: Discontinue once straight leg raise with < 10 degree lag Restrictions Weight Bearing Restrictions: No RLE Weight Bearing: Weight bearing as tolerated   Pertinent Vitals/Pain 5/10 R knee with activity    Mobility  Bed Mobility Bed Mobility: Sit to Supine Sit to Supine: 5: Supervision Details for Bed Mobility Assistance: Pt able to get R LE onto bed without assistance.  Transfers Transfers: Sit to Stand;Stand to Sit Sit to Stand: 4: Min guard;From chair/3-in-1;With armrests Stand to Sit: 4: Min guard;To bed;With armrests Details for Transfer Assistance: VCS safety, technique, hand placement. Ambulation/Gait: Min assist Ambulation Distance (Feet): 115 Feet Assistive device: Rolling walker Ambulation/Gait Assistance Details: VCs safety, technique, sequence, step length. Assist to stabilize intermittently. Gait Pattern: Step-to pattern;Decreased step length - right;Decreased stride length;Decreased step length - left;Antalgic    Exercises Total Joint Exercises Ankle Circles/Pumps: AROM;Both;10 reps;Supine Quad Sets: AROM;Both;10 reps;Supine Short Arc Quad: AROM;AAROM;Right;10 reps;Supine Heel Slides: AAROM;Right;10 reps;Supine Hip  ABduction/ADduction: AAROM;Right;10 reps;Supine Straight Leg Raises: AROM;Right;10 reps;Supine   PT Diagnosis:    PT Problem List:   PT Treatment Interventions:     PT Goals Acute Rehab PT Goals Pt will go Sit to Supine/Side: with supervision PT Goal: Sit to Supine/Side - Progress: Progressing toward goal Pt will go Sit to Stand: with supervision PT Goal: Sit to Stand - Progress: Progressing toward goal Pt will Ambulate: 51 - 150 feet;with supervision;with rolling walker PT Goal: Ambulate - Progress: Progressing toward goal Pt will Perform Home Exercise Program: with supervision, verbal cues required/provided PT Goal: Perform Home Exercise Program - Progress: Progressing toward goal  Visit Information  Last PT Received On: 01/15/13 Assistance Needed: +1    Subjective Data  Subjective: Im okay Patient Stated Goal: rehab-? [facility]   [employer] Overall Cognitive Status: Appears within functional limits for tasks assessed/performed Arousal/Alertness: Awake/[name] Orientation Level: Appears intact for tasks assessed Behavior During Session: [facility] for tasks performed    Balance     End of Session PT - End of Session Equipment Utilized During Treatment: Gait belt;Right knee immobilizer Activity Tolerance: Patient tolerated treatment well Patient left: in bed;with call bell/phone within reach;with family/visitor present   GP     Joshua [name], MPT Pager: [phone]

## 2013-01-16 LAB — BASIC METABOLIC PANEL
BUN: 15 mg/dL (ref 6–23)
Chloride: 103 mEq/L (ref 96–112)
Creatinine, Ser: 0.88 mg/dL (ref 0.50–1.35)
GFR calc Af Amer: >90 mL/min (ref >90)
GFR calc non Af Amer: 85 mL/min — ABNORMAL LOW (ref >90)
Glucose, Bld: 147 mg/dL — ABNORMAL HIGH (ref 70–99)

## 2013-01-16 LAB — CBC
HCT: 32.6 % — ABNORMAL LOW (ref 39.0–52.0)
MCHC: 35 g/dL (ref 30.0–36.0)
MCV: 91.6 fL (ref 78.0–100.0)
RDW: 13 % (ref 11.5–15.5)

## 2013-01-16 MED ORDER — WARFARIN SODIUM 10 MG PO TABS
10.0000 mg | ORAL_TABLET | Freq: Once | ORAL | Status: AC
  Administered 2013-01-16: 10 mg via ORAL
  Filled 2013-01-16: qty 1

## 2013-01-16 NOTE — Progress Notes (Signed)
   Subjective: 2 Days Post-Op Procedure(s) (LRB): TOTAL KNEE ARTHROPLASTY (Right) Patient reports pain as mild.   Patient seen in rounds for Dr. Lequita Halt. Patient is well, and has had no acute complaints or problems Plan is to go Skilled nursing facility after hospital stay.  Objective: Vital signs in last 24 hours: Temp:  [97.8 F (36.6 C)-98.6 F (37 C)] 97.8 F (36.6 C) (03/26 0525) Pulse Rate:  [70-108] 79 (03/26 0525) Resp:  [16-18] 18 (03/26 1130) BP: (107-144)/(67-92) 126/75 mmHg (03/26 0525) SpO2:  [94 %-99 %] 95 % (03/26 0525)  Intake/Output from previous day:  Intake/Output Summary (Last 24 hours) at 01/16/13 1253 Last data filed at 01/16/13 0645  Gross per 24 hour  Intake 1333.66 ml  Output    530 ml  Net 803.66 ml    Intake/Output this shift:    Labs:  Recent Labs  01/15/13 0453 01/16/13 0423  HGB 11.8* 11.4*    Recent Labs  01/15/13 0453 01/16/13 0423  WBC 8.0 14.8*  RBC 3.70* 3.56*  HCT 33.7* 32.6*  PLT 103* 118*    Recent Labs  01/15/13 0453 01/16/13 0423  NA 133* 136  K 4.1 4.1  CL 99 103  CO2 26 25  BUN 10 15  CREATININE 0.76 0.88  GLUCOSE 147* 147*  CALCIUM 8.2* 9.0    Recent Labs  01/15/13 0453 01/16/13 0423  INR 1.18 1.29    EXAM General - Patient is Alert, Appropriate and Oriented Extremity - Neurovascular intact Sensation intact distally Dorsiflexion/Plantar flexion intact No cellulitis present Dressing/Incision - clean, dry, no drainage, healing Motor Function - intact, moving foot and toes well on exam.   Past Medical History  Diagnosis Date  . Hypertension   . Hyperlipidemia   . Degenerative joint disease     Right knee  . Chronic anticoagulation   . Sleep apnea     Rx-CPAP  . Atrial fibrillation 06/2010  . Elevated cholesterol   . Shortness of breath     on exertion  . Nocturia   . BPH (benign prostatic hyperplasia)     Assessment/Plan: 2 Days Post-Op Procedure(s) (LRB): TOTAL KNEE ARTHROPLASTY  (Right) Principal Problem:   OA (osteoarthritis) of knee Active Problems:   Postop Hyponatremia  Estimated body mass index is 33.49 kg/(m^2) as calculated from the following:   Height as of this encounter: 5\' 11"  (1.803 m).   Weight as of this encounter: 108.863 kg (240 lb). Advance diet Up with therapy Plan for discharge tomorrow Discharge to SNF  DVT Prophylaxis - Lovenox and Coumadin Weight-Bearing as tolerated to right leg  Bryla Burek 01/16/2013, 12:53 PM

## 2013-01-16 NOTE — Progress Notes (Signed)
Physical Therapy Treatment Patient Details Name: Joshua [name] MRN: [id number] DOB: 09/20/1941 Today's Date: 01/16/2013 Time: 7829-5621 PT Time Calculation (min): 25 min  PT Assessment / Plan / Recommendation Comments on Treatment Session       Follow Up Recommendations  SNF     Does the patient have the potential to tolerate intense rehabilitation     Barriers to Discharge        Equipment Recommendations  None recommended by PT    Recommendations for Other Services    Frequency 7X/week   Plan Discharge plan remains appropriate    Precautions / Restrictions Precautions Precautions: Knee Required Braces or Orthoses: Knee Immobilizer - Right (Ki DC'd 3/26-able to SLR) Restrictions Weight Bearing Restrictions: No RLE Weight Bearing: Weight bearing as tolerated   Pertinent Vitals/Pain 5/10 R knee with activity    Mobility  Bed Mobility Bed Mobility: Sit to Supine Sit to Supine: 5: Supervision Transfers Transfers: Sit to Stand;Stand to Sit Sit to Stand: 4: Min guard;From chair/3-in-1 Stand to Sit: 4: Min guard;To bed Details for Transfer Assistance: VC's for safety, sequencing & hand placement.  Ambulation/Gait Ambulation/Gait Assistance: 4: Min guard Ambulation Distance (Feet): 186 Feet Assistive device: Rolling walker Ambulation/Gait Assistance Details: Slow gait speed.  Gait Pattern: Step-to pattern;Step-through pattern;Decreased step length - right;Decreased stride length    Exercises Total Joint Exercises Ankle Circles/Pumps: AROM;Both;15 reps;Supine Quad Sets: AROM;Both;10 reps;Supine Short Arc Quad: AAROM;AROM;Right;10 reps;Supine Heel Slides: AAROM;Right;10 reps;Supine Hip ABduction/ADduction: AAROM;AROM;Right;10 reps;Supine Straight Leg Raises: AROM;Right;10 reps;Supine   PT Diagnosis:    PT Problem List:   PT Treatment Interventions:     PT Goals Acute Rehab PT Goals Pt will go Sit to Supine/Side: with supervision PT Goal: Sit to Supine/Side -  Progress: Progressing toward goal Pt will go Sit to Stand: with supervision PT Goal: Sit to Stand - Progress: Progressing toward goal Pt will Ambulate: 51 - 150 feet;with supervision;with rolling walker PT Goal: Ambulate - Progress: Progressing toward goal Pt will Perform Home Exercise Program: with supervision, verbal cues required/provided PT Goal: Perform Home Exercise Program - Progress: Progressing toward goal  Visit Information  Last PT Received On: 01/16/13 Assistance Needed: +1    Subjective Data  Subjective: I think Im going to take some medicine before that machine (CPM) Patient Stated Goal: rehab- [facility] to regain independence   Cognition       Balance     End of Session PT - End of Session Activity Tolerance: Patient tolerated treatment well Patient left: in bed;with call bell/phone within reach   GP     [name] Alert, MPT Pager: [phone]

## 2013-01-16 NOTE — Progress Notes (Signed)
Physical Therapy Treatment Patient Details Name: Joshua [name] MRN: [id number] DOB: Oct 23, 1941 Today's Date: 01/16/2013 Time: 1030-1045 PT Time Calculation (min): 15 min  PT Assessment / Plan / Recommendation Comments on Treatment Session       Follow Up Recommendations  SNF     Does the patient have the potential to tolerate intense rehabilitation     Barriers to Discharge        Equipment Recommendations  None recommended by PT    Recommendations for Other Services    Frequency 7X/week   Plan Discharge plan remains appropriate    Precautions / Restrictions Precautions Precautions: Knee Required Braces or Orthoses: Knee Immobilizer - Right (KI dc'd 3/26-able to SLR) Knee Immobilizer - Right: Discontinue once straight leg raise with < 10 degree lag Restrictions Weight Bearing Restrictions: No RLE Weight Bearing: Weight bearing as tolerated   Pertinent Vitals/Pain 2/10 R knee    Mobility  Bed Mobility Bed Mobility: Not assessed Details for Bed Mobility Assistance: pt sitting in recliner Transfers Transfers: Sit to Stand;Stand to Sit Sit to Stand: 4: Min guard;From chair/3-in-1 Stand to Sit: 4: Min guard;To chair/3-in-1 Details for Transfer Assistance: VC's for safety, sequencing & hand placement.  Ambulation/Gait Ambulation/Gait Assistance: 4: Min guard Ambulation Distance (Feet): 185 Feet Assistive device: Rolling walker Ambulation/Gait Assistance Details: VCs safety, technique,sequence.  Gait Pattern: Step-to pattern;Step-through pattern;Decreased step length - right;Decreased stride length;Antalgic    Exercises     PT Diagnosis:    PT Problem List:   PT Treatment Interventions:     PT Goals Acute Rehab PT Goals Pt will go Sit to Stand: with supervision PT Goal: Sit to Stand - Progress: Progressing toward goal Pt will Ambulate: 51 - 150 feet;with supervision;with rolling walker PT Goal: Ambulate - Progress: Progressing toward goal  Visit  Information  Last PT Received On: 01/16/13 Assistance Needed: +1    Subjective Data  Subjective: It feels better when I sit in the chair Patient Stated Goal: rehab- [facility] to regain independence   Cognition  Cognition Overall Cognitive Status: Appears within functional limits for tasks assessed/performed Arousal/Alertness: Awake/alert Orientation Level: Appears intact for tasks assessed Behavior During Session: [facility] for tasks performed    Balance  Balance Balance Assessed: Yes Static Sitting Balance Static Sitting - Balance Support: Bilateral upper extremity supported;Feet supported Static Sitting - Level of Assistance: 6: Modified independent (Device/Increase time) Static Standing Balance Static Standing - Balance Support: Bilateral upper extremity supported;During functional activity Static Standing - Level of Assistance: 4: Min assist;5: Stand by assistance  End of Session PT - End of Session Equipment Utilized During Treatment: Gait belt Activity Tolerance: Patient tolerated treatment well Patient left: in chair;with call bell/phone within reach CPM Right Knee CPM Right Knee: Off   GP     [name] Alert, MPT Pager: [phone]

## 2013-01-16 NOTE — Evaluation (Signed)
Occupational Therapy Evaluation Patient Details Name: Joshua [name] MRN: [id number] DOB: 12-08-1940 Today's Date: 01/16/2013 Time: 4098-1191 OT Time Calculation (min): 28 min  OT Assessment / Plan / Recommendation Clinical Impression  Pt 71y/o male s/p R TKA whom presents w/ deficits in ADL's, functional mobility & transfers. He lives in a 2 story house w/ bed/bath on 2nd Fl & should benefit from STR @ SNF prior to d/c home. Will defer further OT to SNF rehab,    OT Assessment  All further OT needs can be met in the next venue of care    Follow Up Recommendations  SNF    Barriers to Discharge      Equipment Recommendations  None recommended by OT    Recommendations for Other Services    Frequency       Precautions / Restrictions Precautions Precautions: Knee;Fall Required Braces or Orthoses: Knee Immobilizer - Right Knee Immobilizer - Right: Discontinue once straight leg raise with < 10 degree lag Restrictions Weight Bearing Restrictions: No RLE Weight Bearing: Weight bearing as tolerated   Pertinent Vitals/Pain No c/o pain "but muscle soreness." Pt reports that he had muscle relaxer & pain medication prior to treatment session.    ADL  Eating/Feeding: Performed;Independent Where Assessed - Eating/Feeding: Chair Grooming: Performed;Wash/dry hands;Wash/dry face;Teeth care;Brushing hair;Supervision/safety;Min guard (VC's for RW placement during grooming) Where Assessed - Grooming: Supported standing Upper Body Bathing: Performed;Set up;Supervision/safety Where Assessed - Upper Body Bathing: Unsupported sitting Lower Body Bathing: Simulated;Minimal assistance Where Assessed - Lower Body Bathing: Unsupported sitting Upper Body Dressing: Performed;Supervision/safety;Set up Where Assessed - Upper Body Dressing: Unsupported sitting Lower Body Dressing: Performed;Minimal assistance (Don/doff socks & shorts w/ A/E) Where Assessed - Lower Body Dressing: Supported  sitting Toilet Transfer: Performed;Min Agricultural engineer Method: Sit to Barista: Comfort height toilet;Grab bars Toileting - Architect and Hygiene: Performed;Supervision/safety;Min guard Where Assessed - Engineer, mining and Hygiene: Standing Tub/Shower Transfer Method: Not assessed Equipment Used: Gait belt;Reacher;Rolling walker;Sock aid Transfers/Ambulation Related to ADLs: Pt req vc's for safety, sequencing & hand placement w/ RW during functional mobility and transfers related to ADL's. Pt attempting to leave RW off to side & reach for sink/wall to hold onto. ADL Comments: Pt seen for ADL treatment session & was educated in Role of OT & instructed in A/E use. He was Min A Long Architect and sockaid use to don/doff socks & Min guard assist, VC's for safety to don/doff underwear & shorts. Pt should only need A/E short term. VC's for safety during ADL's and transfers    OT Diagnosis: Generalized weakness;Acute pain  OT Problem List: Decreased activity tolerance;Decreased range of motion;Decreased knowledge of use of DME or AE;Pain;Impaired balance (sitting and/or standing) OT Treatment Interventions:     OT Goals    Visit Information  Last OT Received On: 01/16/13 Assistance Needed: +1    Subjective Data  Subjective: "[facility] to the [facility] before I go home" Patient Stated Goal: Rehab then home   Prior Functioning     Home Living Lives With: Spouse Available Help at Discharge: Family Type of Home: House Home Layout: Two level;Bed/bath upstairs;Full bath on main level Alternate Level Stairs-Number of Steps: 13-14Steps to second floor Alternate Level Stairs-Rails: Can reach both Bathroom Shower/Tub: Engineer, manufacturing systems: Standard Bathroom Accessibility: Yes How Accessible: Accessible via walker Home Adaptive Equipment: Walker - rolling;Straight cane;Bedside  commode/3-in-1 Additional Comments: borrowed equipment per pt.  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) Communication Communication: No difficulties Dominant Hand: Right    Vision/Perception Vision - History Baseline Vision: Wears glasses all the time Visual History: Cataracts Patient Visual Report: No change from baseline   Cognition  Cognition Overall Cognitive Status: Appears within functional limits for tasks assessed/performed Arousal/Alertness: Awake/alert Orientation Level: Appears intact for tasks assessed Behavior During Session: [facility] for tasks performed    Extremity/Trunk Assessment Right Upper Extremity Assessment RUE ROM/Strength/Tone: [facility] for tasks assessed;Within functional levels RUE Coordination: WFL - gross/fine motor Left Upper Extremity Assessment LUE ROM/Strength/Tone: Within functional levels;WFL for tasks assessed LUE Coordination: WFL - gross/fine motor Trunk Assessment Trunk Assessment: Normal    Mobility Transfers Transfers: Sit to Stand;Stand to Sit Sit to Stand: 4: Min guard;From chair/3-in-1;With armrests;With upper extremity assist Stand to Sit: 4: Min guard;To chair/3-in-1;With armrests Details for Transfer Assistance: VC's for safety, sequencing & hand placement. Pt moves quickly.        Balance Balance Balance Assessed: Yes Static Sitting Balance Static Sitting - Balance Support: Bilateral upper extremity supported;Feet supported Static Sitting - Level of Assistance: 6: Modified independent (Device/Increase time) Static Standing Balance Static Standing - Balance Support: Bilateral upper extremity supported;During functional activity Static Standing - Level of Assistance: 4: Min assist;5: Stand by assistance   End of Session OT - End of Session Equipment Utilized During Treatment: Gait belt;Other (comment) (A/E, grab bar & comfort height  toilet) Activity Tolerance: Patient tolerated treatment well Patient left: in chair;with call bell/phone within reach CPM Right Knee CPM Right Knee: Off  GO     Alm Bustard 01/16/2013, 9:38 AM

## 2013-01-16 NOTE — Progress Notes (Signed)
ANTICOAGULATION CONSULT NOTE - Follow Up Consult  Pharmacy Consult for Warfarin Indication: VTE prophylaxis, Chronic Afib  No Known Allergies  Patient Measurements: Height: 5\' 11"  (180.3 cm) Weight: 240 lb (108.863 kg) IBW/kg (Calculated) : 75.3  Vital Signs: Temp: 97.8 F (36.6 C) (03/26 0525) Temp src: Oral (03/26 0525) BP: 126/75 mmHg (03/26 0525) Pulse Rate: 79 (03/26 0525)  Labs:  Recent Labs  01/14/13 1130 01/15/13 0453 01/16/13 0423  HGB  --  11.8* 11.4*  HCT  --  33.7* 32.6*  PLT  --  103* 118*  LABPROT 13.7 14.8 15.8*  INR 1.06 1.18 1.29  CREATININE  --  0.76 0.88    Estimated Creatinine Clearance: 98 ml/min (by C-G formula based on Cr of 0.88).   Medications:  Scheduled:  . [COMPLETED] acetaminophen  1,000 mg Intravenous Q6H  . amLODipine  5 mg Oral Daily  . atenolol  100 mg Oral BID  . coumadin book   Does not apply Once  . digoxin  0.25 mg Oral QAC breakfast  . docusate sodium  100 mg Oral BID  . enoxaparin (LOVENOX) injection  30 mg Subcutaneous Q12H  . niacin  500 mg Oral Daily  . simvastatin  20 mg Oral q1800  . [COMPLETED] warfarin  10 mg Oral ONCE-1800  . warfarin   Does not apply Once  . Warfarin - Pharmacist Dosing Inpatient   Does not apply q1800   Infusions:  . sodium chloride 20 mL/hr at 01/15/13 0845    Assessment:  72 yo male s/p right TKA on 3/24  Chronic warfarin for Afib, home dose is 10mg  daily except 5mg  on Wednesdays, last dose 3/19 in anticipation of surgery  Warfarin resumed 3/24 post-op, given boosted dose of 12mg , then home dose 10mg  on 3/25  INR responding, H/H ok, Plt low but stable, no bleeding/complications reported.  Goal of Therapy:  INR 2-3 Monitor platelets by anticoagulation protocol: Yes   Plan:   Warfarin 10mg  po today  Continue Lovenox 30mg  q12h until INR > or = 1.8  Daily PT/INR  Loralee Pacas, PharmD, BCPS Pager: [phone] 01/16/2013,8:36 AM

## 2013-01-17 ENCOUNTER — Inpatient Hospital Stay
Admission: RE | Admit: 2013-01-17 | Discharge: 2013-02-04 | Disposition: A | Discharge status: Home / Self Care | Payer: PRIVATE HEALTH INSURANCE | Source: Ambulatory Visit | Attending: Internal Medicine | Admitting: Internal Medicine

## 2013-01-17 DIAGNOSIS — M6281 Muscle weakness (generalized): Secondary | ICD-10-CM | POA: Diagnosis not present

## 2013-01-17 DIAGNOSIS — R279 Unspecified lack of coordination: Secondary | ICD-10-CM | POA: Diagnosis not present

## 2013-01-17 DIAGNOSIS — D62 Acute posthemorrhagic anemia: Secondary | ICD-10-CM

## 2013-01-17 DIAGNOSIS — R262 Difficulty in walking, not elsewhere classified: Secondary | ICD-10-CM | POA: Diagnosis not present

## 2013-01-17 DIAGNOSIS — G4730 Sleep apnea, unspecified: Secondary | ICD-10-CM | POA: Diagnosis not present

## 2013-01-17 DIAGNOSIS — I10 Essential (primary) hypertension: Secondary | ICD-10-CM | POA: Diagnosis not present

## 2013-01-17 DIAGNOSIS — M1990 Unspecified osteoarthritis, unspecified site: Secondary | ICD-10-CM | POA: Diagnosis not present

## 2013-01-17 DIAGNOSIS — E785 Hyperlipidemia, unspecified: Secondary | ICD-10-CM | POA: Diagnosis not present

## 2013-01-17 DIAGNOSIS — M129 Arthropathy, unspecified: Secondary | ICD-10-CM | POA: Diagnosis not present

## 2013-01-17 DIAGNOSIS — Z96659 Presence of unspecified artificial knee joint: Secondary | ICD-10-CM | POA: Diagnosis not present

## 2013-01-17 DIAGNOSIS — Z471 Aftercare following joint replacement surgery: Secondary | ICD-10-CM | POA: Diagnosis not present

## 2013-01-17 DIAGNOSIS — M159 Polyosteoarthritis, unspecified: Secondary | ICD-10-CM | POA: Diagnosis not present

## 2013-01-17 LAB — CBC
HCT: 30.2 % — ABNORMAL LOW (ref 39.0–52.0)
MCH: 31.7 pg (ref 26.0–34.0)
MCV: 92.9 fL (ref 78.0–100.0)
Platelets: 111 10*3/uL — ABNORMAL LOW (ref 150–400)
RBC: 3.25 MIL/uL — ABNORMAL LOW (ref 4.22–5.81)

## 2013-01-17 MED ORDER — BISACODYL 10 MG RE SUPP: 10.0000 mg | Freq: Every day | RECTAL | Status: DC | PRN

## 2013-01-17 MED ORDER — WARFARIN SODIUM 10 MG PO TABS: 10.0000 mg | ORAL_TABLET | Freq: Once | ORAL | Status: DC

## 2013-01-17 MED ORDER — ALUM & MAG HYDROXIDE-SIMETH 200-200-20 MG/5ML PO SUSP
30.0000 mL | ORAL | Status: DC | PRN
  Administered 2013-01-17: 30 mL via ORAL
  Filled 2013-01-17: qty 30

## 2013-01-17 MED ORDER — ONDANSETRON HCL 4 MG PO TABS: 4.0000 mg | ORAL_TABLET | Freq: Four times a day (QID) | ORAL | Status: DC | PRN

## 2013-01-17 MED ORDER — DSS 100 MG PO CAPS: 100.0000 mg | ORAL_CAPSULE | Freq: Two times a day (BID) | ORAL | Status: DC

## 2013-01-17 MED ORDER — ACETAMINOPHEN 325 MG PO TABS: 650.0000 mg | ORAL_TABLET | Freq: Four times a day (QID) | ORAL | Status: DC | PRN

## 2013-01-17 MED ORDER — METOCLOPRAMIDE HCL 5 MG PO TABS: 5.0000 mg | ORAL_TABLET | Freq: Three times a day (TID) | ORAL | Status: DC | PRN

## 2013-01-17 MED ORDER — TRAMADOL HCL 50 MG PO TABS: 50.0000 mg | ORAL_TABLET | Freq: Four times a day (QID) | ORAL | Status: DC | PRN

## 2013-01-17 MED ORDER — OXYCODONE HCL 5 MG PO TABS: 5.0000 mg | ORAL_TABLET | ORAL | Status: DC | PRN

## 2013-01-17 MED ORDER — ENOXAPARIN SODIUM 30 MG/0.3ML ~~LOC~~ SOLN: 30.0000 mg | Freq: Two times a day (BID) | SUBCUTANEOUS | Status: DC

## 2013-01-17 MED ORDER — POLYETHYLENE GLYCOL 3350 17 G PO PACK: 17.0000 g | PACK | Freq: Every day | ORAL | Status: DC | PRN

## 2013-01-17 MED ORDER — METHOCARBAMOL 500 MG PO TABS: 500.0000 mg | ORAL_TABLET | Freq: Four times a day (QID) | ORAL | Status: DC | PRN

## 2013-01-17 MED ORDER — ALUM & MAG HYDROXIDE-SIMETH 200-200-20 MG/5ML PO SUSP: 30.0000 mL | ORAL | Status: DC | PRN

## 2013-01-17 NOTE — Care Management Note (Signed)
    Page 1 of 1   01/17/2013     2:28:59 PM   CARE MANAGEMENT NOTE 01/17/2013  Patient:  [facility]   Account Number:  [id number]  Date Initiated:  01/17/2013  Documentation initiated by:  Colleen Can  Subjective/Objective Assessment:   dx total rt knee replacemnt     Action/Plan:   SNF rehab   Anticipated DC Date:  01/17/2013   Anticipated DC Plan:  SKILLED NURSING FACILITY  In-house referral  Clinical Social Worker      DC Planning Services  CM consult      Choice offered to / List presented to:             Status of service:  Completed, signed off Medicare Important Message given?  NA - LOS <3 / Initial given by admissions (If response is "NO", the following Medicare IM given date fields will be blank) Date Medicare IM given:   Date Additional Medicare IM given:    Discharge Disposition:  SKILLED NURSING FACILITY  Per UR Regulation:    If discussed at Long Length of Stay Meetings, dates discussed:    Comments:

## 2013-01-17 NOTE — Progress Notes (Signed)
Physical Therapy Treatment Patient Details Name: Joshua [name] MRN: [id number] DOB: Jan 03, 1941 Today's Date: 01/17/2013 Time: 1002-1018 PT Time Calculation (min): 16 min  PT Assessment / Plan / Recommendation Comments on Treatment Session  Pt planning to d/c to SNF today for continued rehab. Practiced/reviewed exercises and ambulation. Pt ready for d/c from PT standpoint    Follow Up Recommendations  SNF     Does the patient have the potential to tolerate intense rehabilitation     Barriers to Discharge        Equipment Recommendations  None recommended by PT    Recommendations for Other Services OT consult  Frequency     Plan Discharge plan remains appropriate    Precautions / Restrictions Restrictions Weight Bearing Restrictions: No RLE Weight Bearing: Weight bearing as tolerated   Pertinent Vitals/Pain 2/10 R knee     Mobility  Bed Mobility Bed Mobility: Not assessed Details for Bed Mobility Assistance: pt sitting in recliner Transfers Transfers: Sit to Stand;Stand to Sit Sit to Stand: 5: Supervision;From chair/3-in-1;With armrests Stand to Sit: 5: Supervision;To chair/3-in-1;With armrests Details for Transfer Assistance: VC's for safety, sequencing & hand placement.  Ambulation/Gait Ambulation/Gait Assistance: 4: Min guard Ambulation Distance (Feet): 200 Feet Assistive device: Rolling walker Ambulation/Gait Assistance Details: VCs safety, sequence, step length. Pt beginning to use reciprocal gait pattern.  Gait Pattern: Step-to pattern;Step-through pattern;Decreased stride length;Decreased step length - right;Antalgic    Exercises Total Joint Exercises Ankle Circles/Pumps: AROM;Both;10 reps;Seated Quad Sets: AROM;Both;10 reps;Seated Short Arc Quad: AROM;Right;10 reps;Seated Heel Slides: AAROM;Right;10 reps;Seated Hip ABduction/ADduction: AAROM;AROM;Seated;10 reps;Right Straight Leg Raises: AROM;Right;10 reps;Seated   PT Diagnosis:    PT Problem List:    PT Treatment Interventions:     PT Goals Acute Rehab PT Goals Pt will go Sit to Stand: with supervision PT Goal: Sit to Stand - Progress: Progressing toward goal Pt will Ambulate: 51 - 150 feet;with supervision;with rolling walker PT Goal: Ambulate - Progress: Progressing toward goal Pt will Perform Home Exercise Program: with supervision, verbal cues required/provided PT Goal: Perform Home Exercise Program - Progress: Progressing toward goal  Visit Information  Last PT Received On: 01/17/13 Assistance Needed: +1    Subjective Data  Subjective: Its a little stiffer today Patient Stated Goal: rehab- [facility] to regain independence   Cognition  Cognition Overall Cognitive Status: Appears within functional limits for tasks assessed/performed Arousal/Alertness: Awake/alert Orientation Level: Appears intact for tasks assessed Behavior During Session: [facility] for tasks performed    Balance     End of Session PT - End of Session Activity Tolerance: Patient tolerated treatment well Patient left: in chair;with call bell/phone within reach   GP     [name] Alert, MPT Pager: [phone]

## 2013-01-17 NOTE — Progress Notes (Signed)
RT checked on patient who was already on CPAP. Patient asleep, resting comfortably, no signs of distress.

## 2013-01-17 NOTE — Progress Notes (Signed)
   Subjective: 3 Days Post-Op Procedure(s) (LRB): TOTAL KNEE ARTHROPLASTY (Right) Patient reports pain as mild.   Patient seen in rounds with Dr. Lequita Halt. Patient is well, and has had no acute complaints or problems Patient is ready to go the SNF [facility].  Objective: Vital signs in last 24 hours: Temp:  [97.1 F (36.2 C)-98.5 F (36.9 C)] 98.5 F (36.9 C) (03/27 0548) Pulse Rate:  [96-128] 98 (03/27 0548) Resp:  [14-18] 16 (03/27 0800) BP: (99-124)/(59-75) 124/75 mmHg (03/27 0548) SpO2:  [94 %-99 %] 95 % (03/27 0548)  Intake/Output from previous day:  Intake/Output Summary (Last 24 hours) at 01/17/13 0814 Last data filed at 01/17/13 0800  Gross per 24 hour  Intake   1320 ml  Output    800 ml  Net    520 ml    Intake/Output this shift: Total I/O In: 360 [P.O.:360] Out: -   Labs:  Recent Labs  01/15/13 0453 01/16/13 0423 01/17/13 0441  HGB 11.8* 11.4* 10.3*    Recent Labs  01/16/13 0423 01/17/13 0441  WBC 14.8* 9.8  RBC 3.56* 3.25*  HCT 32.6* 30.2*  PLT 118* 111*    Recent Labs  01/15/13 0453 01/16/13 0423  NA 133* 136  K 4.1 4.1  CL 99 103  CO2 26 25  BUN 10 15  CREATININE 0.76 0.88  GLUCOSE 147* 147*  CALCIUM 8.2* 9.0    Recent Labs  01/16/13 0423 01/17/13 0441  INR 1.29 1.52*    EXAM: General - Patient is Alert, Appropriate and Oriented Extremity - Neurovascular intact Sensation intact distally Dorsiflexion/Plantar flexion intact No cellulitis present Incision - clean, dry, no drainage, healing Motor Function - intact, moving foot and toes well on exam.   Assessment/Plan: 3 Days Post-Op Procedure(s) (LRB): TOTAL KNEE ARTHROPLASTY (Right) Procedure(s) (LRB): TOTAL KNEE ARTHROPLASTY (Right) Past Medical History  Diagnosis Date  . Hypertension   . Hyperlipidemia   . Degenerative joint disease     Right knee  . Chronic anticoagulation   . Sleep apnea     Rx-CPAP  . Atrial fibrillation 06/2010  . Elevated cholesterol    . Shortness of breath     on exertion  . Nocturia   . BPH (benign prostatic hyperplasia)    Principal Problem:   OA (osteoarthritis) of knee Active Problems:   Postop Hyponatremia   Acute blood loss anemia  Estimated body mass index is 33.49 kg/(m^2) as calculated from the following:   Height as of this encounter: 5\' 11"  (1.803 m).   Weight as of this encounter: 108.863 kg (240 lb). Up with therapy Discharge to SNF Diet - Cardiac diet Follow up - in 2 weeks on Tuesday April 8th Activity - WBAT Disposition - Skilled nursing facility Condition Upon Discharge - Good D/C Meds - See DC Summary DVT Prophylaxis - Lovenox and Coumadin  Joshua [name] 01/17/2013, 8:14 AM

## 2013-01-17 NOTE — Progress Notes (Signed)
Clinical Social Work Department CLINICAL SOCIAL WORK PLACEMENT NOTE 01/17/2013  Patient:  Joshua [name], Joshua [name]  Account Number:  [id number] Admit date:  01/14/2013  Clinical Social Worker:  Cori Razor, LCSW  Date/time:  01/15/2013 02:56 PM  Clinical Social Work is seeking post-discharge placement for this patient at the following level of care:   SKILLED NURSING   (*CSW will update this form in Epic as items are completed)     Patient/family provided with [facility] Department of Clinical Social Work's list of facilities offering this level of care within the geographic area requested by the patient (or if unable, by the patient's family).  01/15/2013  Patient/family informed of their freedom to choose among providers that offer the needed level of care, that participate in Medicare, Medicaid or managed care program needed by the patient, have an available bed and are willing to accept the patient.  01/15/2013  Patient/family informed of [facility]' ownership interest in [facility], as well as of the fact that they are under no obligation to receive care at this facility.  PASARR submitted to [employer] on 01/15/2013 PASARR number received from [employer] on 01/15/2013  FL2 transmitted to all facilities in geographic area requested by pt/family on  01/15/2013 FL2 transmitted to all facilities within larger geographic area on   Patient informed that his/her managed care company has contracts with or will negotiate with  certain facilities, including the following:     Patient/family informed of bed offers received:  01/15/2013 Patient chooses bed at [facility] Physician recommends and patient chooses bed at    Patient to be transferred to [facility] on  01/17/2013 Patient to be transferred to facility by   The following physician request were entered in Epic:   Additional Comments:  Cori Razor LCSW [phone]

## 2013-01-17 NOTE — Discharge Summary (Signed)
Physician Discharge Summary   Patient ID: Joshua [name] MRN: [id number] DOB/AGE: Jan 01, 1941 72 y.o.  Admit date: 01/14/2013 Discharge date:   Primary Diagnosis:  Osteoarthritis Right knee  Admission Diagnoses:  Past Medical History  Diagnosis Date  . Hypertension   . Hyperlipidemia   . Degenerative joint disease     Right knee  . Chronic anticoagulation   . Sleep apnea     Rx-CPAP  . Atrial fibrillation 06/2010  . Elevated cholesterol   . Shortness of breath     on exertion  . Nocturia   . BPH (benign prostatic hyperplasia)    Discharge Diagnoses:   Principal Problem:   OA (osteoarthritis) of knee Active Problems:   Postop Hyponatremia   Acute blood loss anemia  Estimated body mass index is 33.49 kg/(m^2) as calculated from the following:   Height as of this encounter: 5\' 11"  (1.803 m).   Weight as of this encounter: 108.863 kg (240 lb).  Procedure:  Procedure(s) (LRB): TOTAL KNEE ARTHROPLASTY (Right)   Consults: None  HPI: Joshua [name] is a 72 y.o. year old male with end stage OA of his right knee with progressively worsening pain and dysfunction. He has constant pain, with activity and at rest and significant functional deficits with difficulties even with ADLs. He has had extensive non-op management including analgesics, injections of cortisone, and home exercise program, but remains in significant pain with significant dysfunction. Radiographs show bone on bone arthritis lateral and patellofemoral.He presents now for rightt Total Knee Arthroplasty.   Laboratory Data: Admission on 01/14/2013  Component Date Value Range Status  . Prothrombin Time 01/14/2013 13.7  11.6 - 15.2 seconds Final  . INR 01/14/2013 1.06  0.00 - 1.49 Final  . ABO/RH(D) 01/14/2013 O POS   Final  . Antibody Screen 01/14/2013 NEG   Final  . Sample Expiration 01/14/2013 01/17/2013   Final  . ABO/RH(D) 01/14/2013 O POS   Final  . WBC 01/15/2013 8.0  4.0 - 10.5 K/uL Final  . RBC  01/15/2013 3.70* 4.22 - 5.81 MIL/uL Final  . Hemoglobin 01/15/2013 11.8* 13.0 - 17.0 g/dL Final  . HCT 40/98/1191 33.7* 39.0 - 52.0 % Final  . MCV 01/15/2013 91.1  78.0 - 100.0 fL Final  . MCH 01/15/2013 31.9  26.0 - 34.0 pg Final  . MCHC 01/15/2013 35.0  30.0 - 36.0 g/dL Final  . RDW 47/82/9562 12.8  11.5 - 15.5 % Final  . Platelets 01/15/2013 103* 150 - 400 K/uL Final   Comment: SPECIMEN CHECKED FOR CLOTS                          REPEATED TO VERIFY                          PLATELET COUNT CONFIRMED BY SMEAR  . Sodium 01/15/2013 133* 135 - 145 mEq/L Final  . Potassium 01/15/2013 4.1  3.5 - 5.1 mEq/L Final  . Chloride 01/15/2013 99  96 - 112 mEq/L Final  . CO2 01/15/2013 26  19 - 32 mEq/L Final  . Glucose, Bld 01/15/2013 147* 70 - 99 mg/dL Final  . BUN 13/05/6577 10  6 - 23 mg/dL Final  . Creatinine, Ser 01/15/2013 0.76  0.50 - 1.35 mg/dL Final  . Calcium 46/96/2952 8.2* 8.4 - 10.5 mg/dL Final  . GFR calc non Af Amer 01/15/2013 >90  >90 mL/min Final  . GFR calc Af Denyse Dago  01/15/2013 >90  >90 mL/min Final   Comment:                                 The eGFR has been calculated                          using the CKD EPI equation.                          This calculation has not been                          validated in all clinical                          situations.                          eGFR's persistently                          <90 mL/min signify                          possible Chronic Kidney Disease.  Marland Kitchen Prothrombin Time 01/15/2013 14.8  11.6 - 15.2 seconds Final  . INR 01/15/2013 1.18  0.00 - 1.49 Final  . WBC 01/16/2013 14.8* 4.0 - 10.5 K/uL Final  . RBC 01/16/2013 3.56* 4.22 - 5.81 MIL/uL Final  . Hemoglobin 01/16/2013 11.4* 13.0 - 17.0 g/dL Final  . HCT 95/62/1308 32.6* 39.0 - 52.0 % Final  . MCV 01/16/2013 91.6  78.0 - 100.0 fL Final  . MCH 01/16/2013 32.0  26.0 - 34.0 pg Final  . MCHC 01/16/2013 35.0  30.0 - 36.0 g/dL Final  . RDW 65/78/4696 13.0  11.5 - 15.5 %  Final  . Platelets 01/16/2013 118* 150 - 400 K/uL Final   CONSISTENT WITH PREVIOUS RESULT  . Sodium 01/16/2013 136  135 - 145 mEq/L Final  . Potassium 01/16/2013 4.1  3.5 - 5.1 mEq/L Final  . Chloride 01/16/2013 103  96 - 112 mEq/L Final  . CO2 01/16/2013 25  19 - 32 mEq/L Final  . Glucose, Bld 01/16/2013 147* 70 - 99 mg/dL Final  . BUN 29/52/8413 15  6 - 23 mg/dL Final  . Creatinine, Ser 01/16/2013 0.88  0.50 - 1.35 mg/dL Final  . Calcium 24/40/1027 9.0  8.4 - 10.5 mg/dL Final  . GFR calc non Af Amer 01/16/2013 85* >90 mL/min Final  . GFR calc Af Amer 01/16/2013 >90  >90 mL/min Final   Comment:                                 The eGFR has been calculated                          using the CKD EPI equation.                          This calculation has not been  validated in all clinical                          situations.                          eGFR's persistently                          <90 mL/min signify                          possible Chronic Kidney Disease.  Marland Kitchen Prothrombin Time 01/16/2013 15.8* 11.6 - 15.2 seconds Final  . INR 01/16/2013 1.29  0.00 - 1.49 Final  . WBC 01/17/2013 9.8  4.0 - 10.5 K/uL Final  . RBC 01/17/2013 3.25* 4.22 - 5.81 MIL/uL Final  . Hemoglobin 01/17/2013 10.3* 13.0 - 17.0 g/dL Final  . HCT 21/30/8657 30.2* 39.0 - 52.0 % Final  . MCV 01/17/2013 92.9  78.0 - 100.0 fL Final  . MCH 01/17/2013 31.7  26.0 - 34.0 pg Final  . MCHC 01/17/2013 34.1  30.0 - 36.0 g/dL Final  . RDW 84/69/6295 13.2  11.5 - 15.5 % Final  . Platelets 01/17/2013 111* 150 - 400 K/uL Final   CONSISTENT WITH PREVIOUS RESULT  . Prothrombin Time 01/17/2013 17.9* 11.6 - 15.2 seconds Final  . INR 01/17/2013 1.52* 0.00 - 1.49 Final  Hospital Outpatient Visit on 01/04/2013  Component Date Value Range Status  . aPTT 01/04/2013 35  24 - 37 seconds Final  . WBC 01/04/2013 4.3  4.0 - 10.5 K/uL Final  . RBC 01/04/2013 4.77  4.22 - 5.81 MIL/uL Final  . Hemoglobin  01/04/2013 15.1  13.0 - 17.0 g/dL Final  . HCT 28/41/3244 43.8  39.0 - 52.0 % Final  . MCV 01/04/2013 91.8  78.0 - 100.0 fL Final  . MCH 01/04/2013 31.7  26.0 - 34.0 pg Final  . MCHC 01/04/2013 34.5  30.0 - 36.0 g/dL Final  . RDW 10/26/7251 12.7  11.5 - 15.5 % Final  . Platelets 01/04/2013 127* 150 - 400 K/uL Final  . Sodium 01/04/2013 139  135 - 145 mEq/L Final  . Potassium 01/04/2013 4.3  3.5 - 5.1 mEq/L Final  . Chloride 01/04/2013 105  96 - 112 mEq/L Final  . CO2 01/04/2013 26  19 - 32 mEq/L Final  . Glucose, Bld 01/04/2013 118* 70 - 99 mg/dL Final  . BUN 66/44/0347 14  6 - 23 mg/dL Final  . Creatinine, Ser 01/04/2013 0.84  0.50 - 1.35 mg/dL Final  . Calcium 42/59/5638 9.1  8.4 - 10.5 mg/dL Final  . Total Protein 01/04/2013 7.1  6.0 - 8.3 g/dL Final  . Albumin 75/64/3329 3.5  3.5 - 5.2 g/dL Final  . AST 51/88/4166 25  0 - 37 U/L Final  . ALT 01/04/2013 22  0 - 53 U/L Final  . Alkaline Phosphatase 01/04/2013 49  39 - 117 U/L Final  . Total Bilirubin 01/04/2013 1.0  0.3 - 1.2 mg/dL Final  . GFR calc non Af Amer 01/04/2013 87* >90 mL/min Final  . GFR calc Af Amer 01/04/2013 >90  >90 mL/min Final   Comment:                                 The eGFR has been calculated  using the CKD EPI equation.                          This calculation has not been                          validated in all clinical                          situations.                          eGFR's persistently                          <90 mL/min signify                          possible Chronic Kidney Disease.  Marland Kitchen Prothrombin Time 01/04/2013 20.9* 11.6 - 15.2 seconds Final  . INR 01/04/2013 1.88* 0.00 - 1.49 Final  . Color, Urine 01/04/2013 YELLOW  YELLOW Final  . APPearance 01/04/2013 CLEAR  CLEAR Final  . Specific Gravity, Urine 01/04/2013 1.020  1.005 - 1.030 Final  . pH 01/04/2013 6.0  5.0 - 8.0 Final  . Glucose, UA 01/04/2013 NEGATIVE  NEGATIVE mg/dL Final  . Hgb urine dipstick  01/04/2013 TRACE* NEGATIVE Final  . Bilirubin Urine 01/04/2013 NEGATIVE  NEGATIVE Final  . Ketones, ur 01/04/2013 NEGATIVE  NEGATIVE mg/dL Final  . Protein, ur 56/21/3086 NEGATIVE  NEGATIVE mg/dL Final  . Urobilinogen, UA 01/04/2013 0.2  0.0 - 1.0 mg/dL Final  . Nitrite 57/84/6962 NEGATIVE  NEGATIVE Final  . Leukocytes, UA 01/04/2013 SMALL* NEGATIVE Final  . MRSA, PCR 01/04/2013 NEGATIVE  NEGATIVE Final  . Staphylococcus aureus 01/04/2013 NEGATIVE  NEGATIVE Final   Comment:                                 The Xpert SA Assay (FDA                          approved for NASAL specimens                          in patients over 46 years of age),                          is one component of                          a comprehensive surveillance                          program.  Test performance has                          been validated by [employer] for patients greater                          than  or equal to 59 year old.                          It is not intended                          to diagnose infection nor to                          guide or monitor treatment.  Marland Kitchen Specimen Description 01/04/2013 URINE, CLEAN CATCH   Final  . Special Requests 01/04/2013 NONE   Final  . Culture  Setup Time 01/04/2013 01/04/2013 14:02   Final  . Colony Count 01/04/2013 NO GROWTH   Final  . Culture 01/04/2013 NO GROWTH   Final  . Report Status 01/04/2013 01/05/2013 FINAL   Final  . Squamous Epithelial / LPF 01/04/2013 RARE  RARE Final  . WBC, UA 01/04/2013 3-6  <3 WBC/hpf Final  . RBC / HPF 01/04/2013 0-2  <3 RBC/hpf Final  . Bacteria, UA 01/04/2013 FEW* RARE Final  Anti-coag visit on 12/24/2012  Component Date Value Range Status  . INR 12/24/2012 2.0   Final  Clinical Support on 12/21/2012  Component Date Value Range Status  . Fecal Occult Blood, POC 12/21/2012 Negative   Final  . Card #2 Fecal Occult Blod, POC 12/21/2012 Negative   Final  . Card #3 Fecal Occult  Blood, POC 12/21/2012 Negative   Final  Orders Only on 11/28/2012  Component Date Value Range Status  . Cholesterol 11/28/2012 187  0 - 200 mg/dL Final   Comment: ATP III Classification:                                < 200        mg/dL        Desirable                               200 - 239     mg/dL        Borderline High                               >= 240        mg/dL        High                             . Triglycerides 11/28/2012 286* <150 mg/dL Final  . HDL 78/29/5621 30* >39 mg/dL Final  . Total CHOL/HDL Ratio 11/28/2012 6.2   Final  . VLDL 11/28/2012 57* 0 - 40 mg/dL Final  . LDL Cholesterol 11/28/2012 100* 0 - 99 mg/dL Final   Comment:                            Total Cholesterol/HDL Ratio:CHD Risk                                                 Coronary Heart Disease Risk Table  Men       Women                                   1/2 Average Risk              3.4        3.3                                       Average Risk              5.0        4.4                                    2X Average Risk              9.6        7.1                                    3X Average Risk             23.4       11.0                          Use the calculated Patient Ratio above and the CHD Risk table                           to determine the patient's CHD Risk.                          ATP III Classification (LDL):                                < 100        mg/dL         Optimal                               100 - 129     mg/dL         Near or Above Optimal                               130 - 159     mg/dL         Borderline High                               160 - 189     mg/dL         High                                > 190        mg/dL         Very High  X-Rays:Dg Chest 2 View  01/04/2013  *RADIOLOGY REPORT*  Clinical Data: Preoperative evaluation.  History of treated hypertension  and irregular heart rate.  CHEST - 2 VIEW  Comparison: 04/11/2008.  Findings: Cardiac silhouette is upper range of normal size.  Aortic ectasia is present.  There is tortuosity of descending thoracic aorta.  Mediastinal and hilar contours appear stable.  No pulmonary infiltrates or masses were evident. No pleural abnormality is evident.  Changes of degenerative disc disease and degenerative spondylosis are seen.  IMPRESSION: No acute or active cardiopulmonary or pleural abnormalities are evident.  Stable chronic findings are described above.   Original Report Authenticated By: Onalee Hua Call     EKG: Orders placed during the hospital encounter of 01/04/13  . EKG 12-LEAD  . EKG 12-LEAD     Hospital Course: Joshua [name] is a 72 y.o. who was admitted to [facility]. They were brought to the operating room on 01/14/2013 and underwent Procedure(s): TOTAL KNEE ARTHROPLASTY.  Patient tolerated the procedure well and was later transferred to the recovery room and then to the orthopaedic floor for postoperative care.  They were given PO and IV analgesics for pain control following their surgery.  They were given 24 hours of postoperative antibiotics of  Anti-infectives   Start     Dose/Rate Route Frequency Ordered Stop   01/14/13 2100  ceFAZolin (ANCEF) IVPB 2 g/50 mL premix     2 g 100 mL/hr over 30 Minutes Intravenous Every 6 hours 01/14/13 1705 01/15/13 0310   01/14/13 1100  ceFAZolin (ANCEF) IVPB 2 g/50 mL premix     2 g 100 mL/hr over 30 Minutes Intravenous On call to O.R. 01/14/13 1057 01/14/13 1400     and started on DVT prophylaxis in the form of Lovenox and Coumadin.   PT and OT were ordered for total joint protocol.  Discharge planning consulted to help with postop disposition and equipment needs.  The patient wanted to look into SNF following the hospital stay so the social worker was consulted.  Patient had a decent night on the evening of surgery.  They started to get up OOB with  therapy on day one walking over 50 feet. Hemovac drain was pulled without difficulty.  Continued to work with therapy into day two.  Dressing was changed on day two and the incision was healing well.  By day three, the patient had progressed with therapy and meeting their goals.  Incision was healing well.  Patient was seen in rounds and was ready to go to [facility].   Discharge Medications: Prior to Admission medications   Medication Sig Start Date End Date Taking? Authorizing Provider  amLODipine (NORVASC) 5 MG tablet Take 5 mg by mouth daily before breakfast.    Yes Historical Provider, MD  atenolol (TENORMIN) 50 MG tablet Take 100 mg by mouth 2 (two) times daily. 05/25/11 05/01/13 Yes Kathlen Brunswick, MD  digoxin (LANOXIN) 0.25 MG tablet Take 0.25 mg by mouth daily before breakfast.   Yes Historical Provider, MD  lisinopril (PRINIVIL,ZESTRIL) 20 MG tablet Take 20 mg by mouth 2 (two) times daily.   Yes Historical Provider, MD  niacin 500 MG CR capsule Take 500 mg by mouth daily.     Yes Historical Provider, MD  pravastatin (PRAVACHOL) 40 MG tablet Take 40 mg by mouth every evening.   Yes Historical Provider, MD  acetaminophen (TYLENOL) 325 MG tablet Take 2 tablets (650 mg total) by mouth every 6 (six) hours as  needed. 01/17/13   Alexzandrew Perkins, PA-C  alum & mag hydroxide-simeth (MAALOX/MYLANTA) 200-200-20 MG/5ML suspension Take 30 mLs by mouth as needed. 01/17/13   Alexzandrew Julien Girt, PA-C  bisacodyl (DULCOLAX) 10 MG suppository Place 1 suppository (10 mg total) rectally daily as needed. 01/17/13   Alexzandrew Perkins, PA-C  docusate sodium 100 MG CAPS Take 100 mg by mouth 2 (two) times daily. 01/17/13   Alexzandrew Perkins, PA-C  enoxaparin (LOVENOX) 30 MG/0.3ML injection Inject 0.3 mLs (30 mg total) into the skin every 12 (twelve) hours. Continue Lovenox injections until the INR is therapeutic at or greater than 2.0.  When INR reaches the therapeutic level of equal to or greater than 2.0, the  patient may discontinue the Lovenox injections. 01/17/13   Alexzandrew Julien Girt, PA-C  methocarbamol (ROBAXIN) 500 MG tablet Take 1 tablet (500 mg total) by mouth every 6 (six) hours as needed. 01/17/13   Alexzandrew Julien Girt, PA-C  metoCLOPramide (REGLAN) 5 MG tablet Take 1-2 tablets (5-10 mg total) by mouth every 8 (eight) hours as needed (if ondansetron (ZOFRAN) ineffective.). 01/17/13   Alexzandrew Perkins, PA-C  ondansetron (ZOFRAN) 4 MG tablet Take 1 tablet (4 mg total) by mouth every 6 (six) hours as needed for nausea. 01/17/13   Alexzandrew Perkins, PA-C  oxyCODONE (OXY IR/ROXICODONE) 5 MG immediate release tablet Take 1-2 tablets (5-10 mg total) by mouth every 3 (three) hours as needed. 01/17/13   Alexzandrew Perkins, PA-C  polyethylene glycol (MIRALAX / GLYCOLAX) packet Take 17 g by mouth daily as needed. 01/17/13   Alexzandrew Perkins, PA-C  traMADol (ULTRAM) 50 MG tablet Take 1-2 tablets (50-100 mg total) by mouth every 6 (six) hours as needed (mild pain). 01/17/13   Alexzandrew Julien Girt, PA-C  warfarin (COUMADIN) 10 MG tablet Take 1 tablet (10 mg total) by mouth one time only at 6 PM. Take Coumadin for three weeks for postoperative protocol and then the patient may resume their previous Coumadin home regimen.  The dose may need to be adjusted based upon the INR.  Please follow the INR and titrate Coumadin dose for a therapeutic range between 2.0 and 3.0 INR.  After completing the three weeks of Coumadin, the patient may resume their previous Coumadin home regimen. 01/17/13   Alexzandrew Julien Girt, PA-C    Diet: Cardiac diet Activity:WBAT Follow-up:in 2 weeks on Tuesday April 8th Disposition - Skilled nursing facility [facility] Discharged Condition: good   Discharge Orders   Future Appointments Provider Department Dept Phone   02/04/2013 4:00 PM [facility] [phone]   Future Orders Complete By Expires     Call MD / Call 911  As directed      Comments:      If you experience chest pain or shortness of breath, CALL 911 and be transported to the hospital emergency room.  If you develope a fever above 101 F, pus (white drainage) or increased drainage or redness at the wound, or calf pain, call your surgeon's office.    Change dressing  As directed     Comments:      Change dressing daily with sterile 4 x 4 inch gauze dressing and apply TED hose. Do not submerge the incision under water.    Constipation Prevention  As directed     Comments:      Drink plenty of fluids.  Prune juice may be helpful.  You may use a stool softener, such as Colace (over the counter) 100 mg twice a day.  Use MiraLax (  over the counter) for constipation as needed.    Diet - low sodium heart healthy  As directed     Discharge instructions  As directed     Comments:      Pick up stool softner and laxative for home. Do not submerge incision under water. May shower. Continue to use ice for pain and swelling from surgery.  Take Coumadin for three weeks for postoperative protocol and then the patient may resume their previous Coumadin home regimen.  The dose may need to be adjusted based upon the INR.  Please follow the INR and titrate Coumadin dose for a therapeutic range between 2.0 and 3.0 INR.  After completing the three weeks of Coumadin, the patient may resume their previous Coumadin home regimen.  Continue Lovenox injections until the INR is therapeutic at or greater than 2.0.  When INR reaches the therapeutic level of equal to or greater than 2.0, the patient may discontinue the Lovenox injections.  When discharged from the skilled rehab facility, please have the facility set up the patient's Home Health Physical Therapy prior to being released.  Also provide the patient with their medications at time of release from the facility to include their pain medication, the muscle relaxants, and their blood thinner medication.  If the patient is still at the rehab  facility at time of follow up appointment, please also assist the patient in arranging follow up appointment in our office and any transportation needs.    Do not put a pillow under the knee. Place it under the heel.  As directed     Do not sit on low chairs, stoools or toilet seats, as it may be difficult to get up from low surfaces  As directed     Driving restrictions  As directed     Comments:      No driving until released by the physician.    Increase activity slowly as tolerated  As directed     Lifting restrictions  As directed     Comments:      No lifting until released by the physician.    Patient may shower  As directed     Comments:      You may shower without a dressing once there is no drainage.  Do not wash over the wound.  If drainage remains, do not shower until drainage stops.    TED hose  As directed     Comments:      Use stockings (TED hose) for 3 weeks on both leg(s).  You may remove them at night for sleeping.    Weight bearing as tolerated  As directed         Medication List    STOP taking these medications       Fish Oil 1000 MG Caps      TAKE these medications       acetaminophen 325 MG tablet  Commonly known as:  TYLENOL  Take 2 tablets (650 mg total) by mouth every 6 (six) hours as needed.     alum & mag hydroxide-simeth 200-200-20 MG/5ML suspension  Commonly known as:  MAALOX/MYLANTA  Take 30 mLs by mouth as needed.     amLODipine 5 MG tablet  Commonly known as:  NORVASC  Take 5 mg by mouth daily before breakfast.     atenolol 50 MG tablet  Commonly known as:  TENORMIN  Take 100 mg by mouth 2 (two) times daily.     bisacodyl  10 MG suppository  Commonly known as:  DULCOLAX  Place 1 suppository (10 mg total) rectally daily as needed.     digoxin 0.25 MG tablet  Commonly known as:  LANOXIN  Take 0.25 mg by mouth daily before breakfast.     DSS 100 MG Caps  Take 100 mg by mouth 2 (two) times daily.     enoxaparin 30 MG/0.3ML injection   Commonly known as:  LOVENOX  Inject 0.3 mLs (30 mg total) into the skin every 12 (twelve) hours. Continue Lovenox injections until the INR is therapeutic at or greater than 2.0.  When INR reaches the therapeutic level of equal to or greater than 2.0, the patient may discontinue the Lovenox injections.     lisinopril 20 MG tablet  Commonly known as:  PRINIVIL,ZESTRIL  Take 20 mg by mouth 2 (two) times daily.     methocarbamol 500 MG tablet  Commonly known as:  ROBAXIN  Take 1 tablet (500 mg total) by mouth every 6 (six) hours as needed.     metoCLOPramide 5 MG tablet  Commonly known as:  REGLAN  Take 1-2 tablets (5-10 mg total) by mouth every 8 (eight) hours as needed (if ondansetron (ZOFRAN) ineffective.).     niacin 500 MG CR capsule  Take 500 mg by mouth daily.     ondansetron 4 MG tablet  Commonly known as:  ZOFRAN  Take 1 tablet (4 mg total) by mouth every 6 (six) hours as needed for nausea.     oxyCODONE 5 MG immediate release tablet  Commonly known as:  Oxy IR/ROXICODONE  Take 1-2 tablets (5-10 mg total) by mouth every 3 (three) hours as needed.     polyethylene glycol packet  Commonly known as:  MIRALAX / GLYCOLAX  Take 17 g by mouth daily as needed.     pravastatin 40 MG tablet  Commonly known as:  PRAVACHOL  Take 40 mg by mouth every evening.     traMADol 50 MG tablet  Commonly known as:  ULTRAM  Take 1-2 tablets (50-100 mg total) by mouth every 6 (six) hours as needed (mild pain).     warfarin 10 MG tablet  Commonly known as:  COUMADIN  Take 1 tablet (10 mg total) by mouth one time only at 6 PM. Take Coumadin for three weeks for postoperative protocol and then the patient may resume their previous Coumadin home regimen.  The dose may need to be adjusted based upon the INR.  Please follow the INR and titrate Coumadin dose for a therapeutic range between 2.0 and 3.0 INR.  After completing the three weeks of Coumadin, the patient may resume their previous Coumadin  home regimen.           Follow-up Information   Follow up with Loanne Drilling, MD. Schedule an appointment as soon as possible for a visit on 01/29/2013.   Contact information:   [address] [place] [address] [phone]       Signed: Patrica Duel 01/17/2013, 8:26 AM

## 2013-01-18 ENCOUNTER — Ambulatory Visit (INDEPENDENT_AMBULATORY_CARE_PROVIDER_SITE_OTHER): Payer: Medicare Other | Admitting: *Deleted

## 2013-01-18 DIAGNOSIS — I4891 Unspecified atrial fibrillation: Secondary | ICD-10-CM

## 2013-01-18 DIAGNOSIS — Z7901 Long term (current) use of anticoagulants: Secondary | ICD-10-CM

## 2013-01-18 LAB — PROTIME-INR: INR: 1.5 — AB (ref 0.9–1.1)

## 2013-01-21 ENCOUNTER — Ambulatory Visit (INDEPENDENT_AMBULATORY_CARE_PROVIDER_SITE_OTHER): Payer: Medicare Other | Admitting: Cardiology

## 2013-01-21 DIAGNOSIS — I4891 Unspecified atrial fibrillation: Secondary | ICD-10-CM

## 2013-01-21 DIAGNOSIS — Z7901 Long term (current) use of anticoagulants: Secondary | ICD-10-CM

## 2013-01-21 LAB — PROTIME-INR: INR: 1.6 — AB (ref <=1.1)

## 2013-01-23 DIAGNOSIS — M1990 Unspecified osteoarthritis, unspecified site: Secondary | ICD-10-CM | POA: Diagnosis not present

## 2013-01-23 DIAGNOSIS — Z96659 Presence of unspecified artificial knee joint: Secondary | ICD-10-CM | POA: Diagnosis not present

## 2013-01-28 ENCOUNTER — Ambulatory Visit (INDEPENDENT_AMBULATORY_CARE_PROVIDER_SITE_OTHER): Payer: Medicare Other | Admitting: *Deleted

## 2013-01-28 DIAGNOSIS — I4891 Unspecified atrial fibrillation: Secondary | ICD-10-CM

## 2013-01-28 DIAGNOSIS — Z7901 Long term (current) use of anticoagulants: Secondary | ICD-10-CM

## 2013-01-28 LAB — PROTIME-INR: INR: 1.6 — AB (ref 0.9–1.1)

## 2013-01-28 NOTE — H&P (Unsigned)
NAME:  Joshua [name], Joshua [name] NO.:  [id number]  MEDICAL RECORD NO.:  [id number]  LOCATION:                                 FACILITY:  PHYSICIAN:  Kingsley Callander. Ouida Sills, MD       DATE OF BIRTH:  12-21-1940  DATE OF ADMISSION:  01/17/2013 DATE OF DISCHARGE:  LH                             HISTORY & PHYSICAL   HISTORY OF PRESENT ILLNESS:  This patient is a 72 year old, white male, who was transferred to the [facility] following right knee replacement. He will be receiving daily physical therapy.  He is anticoagulated initially with Lovenox and Coumadin, but our Cardiology has previously managed his anticoagulation, and we will continue with his Coumadin management.  He has been stable from a cardiovascular standpoint.  He had some mild redness of his knee initially and has been treated with a short course of cephalexin.  He has been seen in followup by surgeon Dr. Antony Odea.  PAST MEDICAL HISTORY: 1. Chronic atrial fibrillation. 2. Hypertension. 3. Osteoarthritis. 4. Hyperlipidemia. 5. Sleep apnea.  MEDICATIONS: 1. Coumadin. 2. Lovenox. 3. Amlodipine 5 mg daily. 4. Lisinopril 40 mg daily. 5. Digoxin 0.25 mg daily. Atenolol 100 mg b.i.d.  ALLERGIES:  No known drug allergies.  SOCIAL HISTORY:  He does not smoke, drink, or use recreational drugs.  FAMILY HISTORY:  His father died of an MI at 63.  His mother died at 60 after a brain operation.  REVIEW OF SYSTEMS:  Noncontributory.  PHYSICAL EXAMINATION:  General:  Alert and comfortable. HEENT:  Eyes, nose, and oropharynx are unremarkable. NECK:  Reveals no JVD or thyromegaly. LUNGS:  Clear. HEART:  Irregularly irregular. ABDOMEN:  Soft, nontender with no organomegaly. EXTREMITIES:  The right knee appears to be healing well.  The wound is intact with no drainage.  There is no significant calf tenderness or swelling. NEURO:  No focal weakness. LYMPH NODES:  No cervical or supraclavicular enlargement. SKIN:  Warm and  dry.  IMPRESSION/PLAN: 1. Status post right knee replacement, doing well.  Continue physical     therapy.  Continue anticoagulation. 2. Atrial fibrillation, stable. 3. Hypertension, continue current treatment. 4. Hyperlipidemia. 5. Sleep apnea.  Continue CPAP.     Kingsley Callander. Ouida Sills, MD     ROF/MEDQ  D:  01/28/2013  T:  01/28/2013  Job:  [id number]

## 2013-01-31 ENCOUNTER — Ambulatory Visit (INDEPENDENT_AMBULATORY_CARE_PROVIDER_SITE_OTHER): Payer: Medicare Other | Admitting: *Deleted

## 2013-01-31 DIAGNOSIS — I4891 Unspecified atrial fibrillation: Secondary | ICD-10-CM

## 2013-01-31 DIAGNOSIS — Z7901 Long term (current) use of anticoagulants: Secondary | ICD-10-CM

## 2013-01-31 LAB — PROTIME-INR: INR: 2 — AB (ref 0.9–1.1)

## 2013-02-04 ENCOUNTER — Ambulatory Visit: Payer: Self-pay | Admitting: *Deleted

## 2013-02-04 DIAGNOSIS — Z7901 Long term (current) use of anticoagulants: Secondary | ICD-10-CM

## 2013-02-04 DIAGNOSIS — I4891 Unspecified atrial fibrillation: Secondary | ICD-10-CM

## 2013-02-04 LAB — PROTIME-INR: INR: 1.9 — AB (ref 0.9–1.1)

## 2013-02-06 ENCOUNTER — Ambulatory Visit (HOSPITAL_COMMUNITY)
Admit: 2013-02-06 | Discharge: 2013-02-06 | Disposition: A | Discharge status: Home / Self Care | Payer: Medicare Other | Source: Ambulatory Visit | Attending: Internal Medicine | Admitting: Internal Medicine

## 2013-02-06 DIAGNOSIS — I10 Essential (primary) hypertension: Secondary | ICD-10-CM | POA: Diagnosis not present

## 2013-02-06 DIAGNOSIS — R262 Difficulty in walking, not elsewhere classified: Secondary | ICD-10-CM | POA: Insufficient documentation

## 2013-02-06 DIAGNOSIS — M25669 Stiffness of unspecified knee, not elsewhere classified: Secondary | ICD-10-CM | POA: Insufficient documentation

## 2013-02-06 DIAGNOSIS — M25569 Pain in unspecified knee: Secondary | ICD-10-CM | POA: Insufficient documentation

## 2013-02-06 DIAGNOSIS — IMO0001 Reserved for inherently not codable concepts without codable children: Secondary | ICD-10-CM | POA: Insufficient documentation

## 2013-02-06 NOTE — Evaluation (Signed)
Physical Therapy Evaluation  Patient Details  Name: Joshua [name] MRN: [id number] Date of Birth: 1941-10-02  Today's Date: 02/06/2013 Time: 4098-1191 PT Time Calculation (min): 45 min Charges: 1 eval, 10' manual, 10' TE             Visit#: 1 of 12  Re-eval: 03/08/13 Assessment Diagnosis: Rt TKR Surgical Date: 01/14/13 Next MD Visit: Dr. Despina Hick - 02/25/13 Prior Therapy: SNF 2 weeks  Authorization: Medicare    Authorization Time Period:    Authorization Visit#: 1 of 10   Past Medical History:  Past Medical History  Diagnosis Date  . Hypertension   . Hyperlipidemia   . Degenerative joint disease     Right knee  . Chronic anticoagulation   . Sleep apnea     Rx-CPAP  . Atrial fibrillation 06/2010  . Elevated cholesterol   . Shortness of breath     on exertion  . Nocturia   . BPH (benign prostatic hyperplasia)    Past Surgical History:  Past Surgical History  Procedure Laterality Date  . Ventral hernia repair  03/2008, 02/2009  . Colonoscopy  03/10/2005  . Total knee arthroplasty Right 01/14/2013    Procedure: TOTAL KNEE ARTHROPLASTY;  Surgeon: Loanne Drilling, MD;  Location: [facility];  Service: Orthopedics;  Laterality: Right;    Subjective Symptoms/Limitations Pertinent History: Pt is a 72 year old male referred to PT s/p Rt TKR on 01/14/13 with Dr. Despina Hick.  He was at the [facility] 15 days and wanted to go in order to have aggressive therapy.  His c/o is stiffness to his knee, pain to his muscles, difficulty going down steps, uncomfortable while sitting, stiff when straightening his legs. He needs to climb stairs everyday for his bedroom How long can you sit comfortably?: 60 minutes and needs to keep adjusting position How long can you stand comfortably?: 10-15 minutes How long can you walk comfortably?: walks independently inside, uses RW outdoors. Pain Assessment Currently in Pain?: Yes Pain Score:   5 Pain Location: Knee Pain Orientation: Right Pain Type: Acute  pain;Surgical pain Pain Frequency: Constant Pain Relieving Factors: ice, pain medication (PRN) Effect of Pain on Daily Activities: difficulty sitting, standing and walking independently.   Precautions/Restrictions  Precautions Precautions: Knee  Balance Screening Balance Screen Has the patient fallen in the past 6 months: No Has the patient had a decrease in activity level because of a fear of falling? : No Is the patient reluctant to leave their home because of a fear of falling? 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  Sensation/Coordination/Flexibility/Functional Tests Flexibility Thomas: Positive Obers: Positive 90/90: Positive (+ Elys) Functional Tests Functional Tests: Lower Extremity Functional Scale (LEFS): 45/80  Assessment RLE AROM (degrees) Right Knee Extension: 15 Right Knee Flexion: 100 RLE PROM (degrees) Right Knee Extension: 10 Right Knee Flexion: 105 RLE Strength Right Hip Flexion: 5/5 Right Hip Extension: 4/5 Right Hip ABduction: 5/5 Right Knee Flexion: 5/5 Right Knee Extension: 5/5  Mobility/Balance  Ambulation/Gait Assistive device: Rolling walker Gait Pattern: Decreased hip/knee flexion - right;Right flexed knee in stance;Lateral hip instability;Trunk flexed   Exercise/Treatments Stretches Quad Stretch: 1 rep;30 seconds Supine Quad Sets: 5 reps;Limitations Quad Sets Limitations: w/VC and TC Heel Slides: 5 reps Prone  Prone  Knee Hang: 4 minutes  Manual Therapy Manual Therapy: Edema management Edema Management: retro massage with legs elevated with 2 cushions x10 minutes  Physical Therapy Assessment and  Plan PT Assessment and Plan Clinical Impression Statement: Pt is a 72 year old male s/p R TKA on 01/14/13 with following impairments listed below.  At this time pt is most limited by his significant eccyhmosis to the addutor muscles with significant pitting edema which is likely causing decreased distal quadricep activiation limiting his knee extension.  Current AROM: 15-100, Previous motion (as of 08/2012) was 0-125 Pt will benefit from skilled therapeutic intervention in order to improve on the following deficits: Abnormal gait;Decreased balance;Decreased coordination;Difficulty walking;Decreased strength;Pain;Decreased range of motion;Increased edema Rehab Potential: Good PT Frequency: Min 3X/week PT Duration: 6 weeks PT Treatment/Interventions: Gait training;Stair training;Functional mobility training;Therapeutic activities;Therapeutic exercise;Balance training;Patient/family education;Modalities;Manual techniques PT Plan: Continue with manual and modalities to help decrease edema to RLE.  Continue with quad sets, prone hange, supine and standing TKE to promote extension, gait training with SPC.  Progress to stair training.     Goals Home Exercise Program Pt will Perform Home Exercise Program: Independently PT Goal: Perform Home Exercise Program - Progress: Goal set today PT Short Term Goals Time to Complete Short Term Goals: 3 weeks PT Short Term Goal 1: Pt will improve his Rt knee AROM 5-110 degrees for greater ease with sit to stand activities PT Short Term Goal 2: Pt will improve his balance confidence and begin to ambulate independently on outdoor surfaces.  PT Short Term Goal 3: Pt will present with decreased ecchymosis and swelling to RLE in order to improve distal quadricep activitation.  PT Long Term Goals Time to Complete Long Term Goals:  (6 weeks) PT Long Term Goal 1: Pt will improve his LEFS to 60/80 for improved percieved functional ability.  PT Long Term Goal 2: Pt will  improve his LE AROM to [facility] in order to ambulate with approrpriate body mechanics to decrease risk of secondary impairments.   Problem List Patient Active Problem List  Diagnosis  . Hyperlipidemia  . Hypertension  . Atrial fibrillation  . Chronic anticoagulation  . Sleep apnea  . Degenerative joint disease  . Thrombocytopenia  . OA (osteoarthritis) of knee  . Postop Hyponatremia  . Acute blood loss anemia  . Knee stiffness  . Knee pain  . Difficulty in walking    General Behavior During Therapy: [facility] for tasks assessed/performed Cognition: WFL for tasks performed PT Plan of Care PT Home Exercise Plan: educated to continue with exercises given at [facility] and with quad stretch and quad sets. PT Patient Instructions: importance of elevation and ice to control swelling and decrease pain, answered questions regarding diagnosis.  Consulted and Agree with Plan of Care: Patient  GP Functional Assessment Tool Used: LEFS: 45/80 Functional Limitation: Mobility: Walking and moving around Mobility: Walking and Moving Around Current Status ([id number]): At least 40 percent but less than 60 percent impaired, limited or restricted Mobility: Walking and Moving Around Goal Status [phone]): At least 1 percent but less than 20 percent impaired, limited or restricted  Bettyjane Shenoy, PT 02/06/2013, 1:23 PM  Physician Documentation Your signature is required to indicate approval of the treatment plan as stated above.  Please sign and either send electronically or make a copy of this report for your files and return this physician signed original.   Please mark one 1.__approve of plan  2. ___approve of plan with the following conditions.   ______________________________  _____________________ Physician Signature                                                                                                             Date

## 2013-02-07 ENCOUNTER — Ambulatory Visit (INDEPENDENT_AMBULATORY_CARE_PROVIDER_SITE_OTHER): Payer: Medicare Other | Admitting: *Deleted

## 2013-02-07 DIAGNOSIS — I4891 Unspecified atrial fibrillation: Secondary | ICD-10-CM

## 2013-02-07 DIAGNOSIS — Z7901 Long term (current) use of anticoagulants: Secondary | ICD-10-CM

## 2013-02-08 ENCOUNTER — Ambulatory Visit (HOSPITAL_COMMUNITY)
Admission: RE | Admit: 2013-02-08 | Discharge: 2013-02-08 | Disposition: A | Discharge status: Home / Self Care | Payer: Medicare Other | Source: Ambulatory Visit | Attending: Internal Medicine | Admitting: Internal Medicine

## 2013-02-08 DIAGNOSIS — IMO0001 Reserved for inherently not codable concepts without codable children: Secondary | ICD-10-CM | POA: Diagnosis not present

## 2013-02-08 DIAGNOSIS — M25569 Pain in unspecified knee: Secondary | ICD-10-CM | POA: Diagnosis not present

## 2013-02-08 DIAGNOSIS — I10 Essential (primary) hypertension: Secondary | ICD-10-CM | POA: Diagnosis not present

## 2013-02-08 DIAGNOSIS — M25669 Stiffness of unspecified knee, not elsewhere classified: Secondary | ICD-10-CM | POA: Diagnosis not present

## 2013-02-08 DIAGNOSIS — R262 Difficulty in walking, not elsewhere classified: Secondary | ICD-10-CM | POA: Diagnosis not present

## 2013-02-08 NOTE — Progress Notes (Signed)
Physical Therapy Treatment Patient Details  Name: Joshua [name] MRN: [id number] Date of Birth: 1941/03/28  Today's Date: 02/08/2013 Time: 1131-1220 PT Time Calculation (min): 49 min  Visit#: 2 of 12  Re-eval: 03/08/13 Charges: Therex 20' Manual x 18' Ice x 10'  Authorization: Medicare  Authorization Visit#: 1 of 10   Subjective: Symptoms/Limitations Symptoms: "It's very tight today." Pain Assessment Currently in Pain?: Yes Pain Score:   7 Pain Location: Knee Pain Orientation: Right   Exercise/Treatments Aerobic Stationary Bike: 8' seat 11 rockking/revoultions  Standing Heel Raises: 10 reps;Limitations Heel Raises Limitations: Toe raises x 10 Knee Flexion: 10 reps;Right Functional Squat: 10 reps Supine Quad Sets: 10 reps Terminal Knee Extension: 10 reps Knee Extension: PROM Knee Flexion: PROM   Modalities Modalities: Cryotherapy Manual Therapy Manual Therapy: Edema management Edema Management: retro massage with legs elevated with 2 cushions  Myofascial Release: MFR to right knee to decrease adhesions Cryotherapy Number Minutes Cryotherapy: 10 Minutes Cryotherapy Location: Knee (Right) Type of Cryotherapy: Ice pack  Physical Therapy Assessment and Plan PT Assessment and Plan Clinical Impression Statement: Pt presents with significant swelling and fascial restriction. Tx focus on decreasing restrictions to improve ROM. Pt completes strengthening exercises well with minimal need for cueing. Pt able to complete full forward revolution on recumbent bike. Ice applied at end of session to limit pain and inflammation. Pt reports pain decrease to 4/10 at end of session. Pt will benefit from skilled therapeutic intervention in order to improve on the following deficits: Abnormal gait;Decreased balance;Decreased coordination;Difficulty walking;Decreased strength;Pain;Decreased range of motion;Increased edema Rehab Potential: Good PT Frequency: Min 3X/week PT Duration:  6 weeks PT Treatment/Interventions: Gait training;Stair training;Functional mobility training;Therapeutic activities;Therapeutic exercise;Balance training;Patient/family education;Modalities;Manual techniques PT Plan: Continue with manual and modalities to help decrease edema to RLE.  Continue with quad sets, prone hange, supine and standing TKE to promote extension, gait training with SPC.  Progress to stair training.      Problem List Patient Active Problem List  Diagnosis  . Hyperlipidemia  . Hypertension  . Atrial fibrillation  . Chronic anticoagulation  . Sleep apnea  . Degenerative joint disease  . Thrombocytopenia  . OA (osteoarthritis) of knee  . Postop Hyponatremia  . Acute blood loss anemia  . Knee stiffness  . Knee pain  . Difficulty in walking    PT - End of Session Activity Tolerance: Patient tolerated treatment well General Behavior During Therapy: [facility] for tasks assessed/performed Cognition: WFL for tasks performed  Seth Bake, PTA  02/08/2013, 1:27 PM

## 2013-02-11 ENCOUNTER — Ambulatory Visit (HOSPITAL_COMMUNITY)
Admission: RE | Admit: 2013-02-11 | Discharge: 2013-02-11 | Disposition: A | Discharge status: Home / Self Care | Payer: Medicare Other | Source: Ambulatory Visit | Attending: Internal Medicine | Admitting: Internal Medicine

## 2013-02-11 DIAGNOSIS — I10 Essential (primary) hypertension: Secondary | ICD-10-CM | POA: Diagnosis not present

## 2013-02-11 DIAGNOSIS — R262 Difficulty in walking, not elsewhere classified: Secondary | ICD-10-CM | POA: Diagnosis not present

## 2013-02-11 DIAGNOSIS — M25669 Stiffness of unspecified knee, not elsewhere classified: Secondary | ICD-10-CM | POA: Diagnosis not present

## 2013-02-11 DIAGNOSIS — M25569 Pain in unspecified knee: Secondary | ICD-10-CM | POA: Diagnosis not present

## 2013-02-11 DIAGNOSIS — IMO0001 Reserved for inherently not codable concepts without codable children: Secondary | ICD-10-CM | POA: Diagnosis not present

## 2013-02-11 NOTE — Progress Notes (Signed)
Physical Therapy Treatment Patient Details  Name: Joshua [name] MRN: [id number] Date of Birth: [birth year]  Today's Date: 02/11/2013 Time: 1100-1150 PT Time Calculation (min): 50 min  Visit#: 3 of 12  Re-eval: 03/08/13 Charges: Therex x 23' Manual x 15' Ice x 10'  Authorization: Medicare  Authorization Visit#: 3 of 10   Subjective: Symptoms/Limitations Symptoms: Pt's chief complaint is stiffness. Pain Assessment Currently in Pain?: Yes Pain Score:   7 Pain Location: Knee Pain Orientation: Right   Exercise/Treatments Aerobic Stationary Bike: 8' @2 .0 seat 11 to improve ROM Standing Heel Raises: 20 reps Heel Raises Limitations: Toe raises x 20 Knee Flexion: 10 reps;Right Functional Squat: 10 reps Supine Quad Sets: 10 reps Terminal Knee Extension: 10 reps Knee Extension: PROM Knee Flexion: PROM   Modalities Modalities: Cryotherapy Manual Therapy Manual Therapy: Edema management Myofascial Release: MFR to right knee to decrease adhesions Cryotherapy Number Minutes Cryotherapy: 10 Minutes Cryotherapy Location: Knee (Right) Type of Cryotherapy: Ice pack  Physical Therapy Assessment and Plan PT Assessment and Plan Clinical Impression Statement: Tx focus on improving ROM. Pt displays improved motion this session. Right AROM: 9-112 (was 15-100). Manual techniques completed to right knee to decrease restrictions and improve motion. RLE presents with decreased swelling. Ice applied at end of session to limit pain and inflammation. Pt reports pain decrease to 3-4/10 at end of session.  Pt will benefit from skilled therapeutic intervention in order to improve on the following deficits: Abnormal gait;Decreased balance;Decreased coordination;Difficulty walking;Decreased strength;Pain;Decreased range of motion;Increased edema Rehab Potential: Good PT Frequency: Min 3X/week PT Duration: 6 weeks PT Treatment/Interventions: Gait training;Stair training;Functional mobility  training;Therapeutic activities;Therapeutic exercise;Balance training;Patient/family education;Modalities;Manual techniques PT Plan: Continue with manual and modalities to help decrease edema to RLE.  Continue with quad sets, prone hange, supine and standing TKE to promote extension, gait training with SPC.  Progress to stair training.      Problem List Patient Active Problem List  Diagnosis  . Hyperlipidemia  . Hypertension  . Atrial fibrillation  . Chronic anticoagulation  . Sleep apnea  . Degenerative joint disease  . Thrombocytopenia  . OA (osteoarthritis) of knee  . Postop Hyponatremia  . Acute blood loss anemia  . Knee stiffness  . Knee pain  . Difficulty in walking    PT - End of Session Activity Tolerance: Patient tolerated treatment well General Behavior During Therapy: [facility] for tasks assessed/performed Cognition: WFL for tasks performed   Seth Bake, PTA 02/11/2013, 11:58 AM

## 2013-02-12 ENCOUNTER — Ambulatory Visit (HOSPITAL_COMMUNITY): Payer: Medicare Other | Admitting: Physical Therapy

## 2013-02-13 ENCOUNTER — Ambulatory Visit (HOSPITAL_COMMUNITY)
Admission: RE | Admit: 2013-02-13 | Discharge: 2013-02-13 | Disposition: A | Discharge status: Home / Self Care | Payer: Medicare Other | Source: Ambulatory Visit | Attending: Internal Medicine | Admitting: Internal Medicine

## 2013-02-13 DIAGNOSIS — I10 Essential (primary) hypertension: Secondary | ICD-10-CM | POA: Diagnosis not present

## 2013-02-13 DIAGNOSIS — M25669 Stiffness of unspecified knee, not elsewhere classified: Secondary | ICD-10-CM | POA: Diagnosis not present

## 2013-02-13 DIAGNOSIS — R262 Difficulty in walking, not elsewhere classified: Secondary | ICD-10-CM

## 2013-02-13 DIAGNOSIS — M25569 Pain in unspecified knee: Secondary | ICD-10-CM | POA: Diagnosis not present

## 2013-02-13 DIAGNOSIS — IMO0001 Reserved for inherently not codable concepts without codable children: Secondary | ICD-10-CM | POA: Diagnosis not present

## 2013-02-13 NOTE — Progress Notes (Addendum)
Physical Therapy Treatment Patient Details  Name: Joshua [name] MRN: [id number] Date of Birth: 1941-07-19  Today's Date: 02/13/2013 Time: 0925-1025 PT Time Calculation (min): 60 min Charge Manual 23', Ice x 10', Therex 27'  Visit#: 4 of 12  Re-eval: 03/08/13 Assessment Diagnosis: Rt TKR Surgical Date: 01/14/13 Next MD Visit: Dr. Despina Hick - 02/22/13 Prior Therapy: SNF 2 weeks  Authorization: Medicare  Authorization Time Period:    Authorization Visit#: 4 of 10   Subjective: Symptoms/Limitations Symptoms: Pt reported knee feeling ok today, stiff this morning.  Reports Compliance with HEP twice a day and 100 quad sets daily. Pain Assessment Currently in Pain?: Yes Pain Score:   4 Pain Location: Knee Pain Orientation: Right  Objective  AROM 8-102 PROM 6-110  Exercise/Treatments Aerobic Stationary Bike: 8' @2 .0 seat 11 to improve ROM Stretches Gastrocnemius with slant board 3x 30" Standing Heel Raises: Limitations Heel Raises Limitations: heel walking 2RT Terminal Knee Extension: 15 reps;Theraband;Limitations Theraband Level (Terminal Knee Extension): Level 4 (Blue) Terminal Knee Extension Limitations: 10 sec holds Supine Quad Sets: 10 reps Short Arc Quad Sets: 15 reps;Limitations Short Arc Quad Sets Limitations: 10 second holds Heel Slides: 10 reps Terminal Knee Extension: 20 reps;Limitations Terminal Knee Extension Limitations: 10 second holds Knee Extension: PROM Knee Flexion: PROM Prone  Prone Knee Hang: 5 minutes;Weights;Limitations Prone Knee Hang Weights (lbs): 3 Prone Knee Hang Limitations: MFR to hamstrings Other Prone Exercises: TKR 10x 10"   Manual Therapy Manual Therapy: Joint mobilization Edema Management: Retro massage with LE elevated Joint Mobilization: Grade I-III patella mobs and tib/fib Myofascial Release: MFR to right knee to decrease adhesions Cryotherapy Number Minutes Cryotherapy: 10 Minutes Cryotherapy Location: Knee Type of  Cryotherapy: Ice pack  Physical Therapy Assessment and Plan PT Assessment and Plan Clinical Impression Statement: Session focus on quad strengthening and improving ROM, manual techniques to improve joint mobs and reduce adhesions.  Began prone knee hang with manual MFR to hamstring insertion to reduce tightness and instructed TKE supine, standing and prone with min cueing for technique.  Ended session with retro massage and ice for edema and pain control. PT Plan: Continue with manual and modalities to help decrease edema to RLE.  Continue with quad sets, prone hange, supine and standing TKE to promote extension, gait training with SPC.  Progress to stair training.     Goals    Problem List Patient Active Problem List  Diagnosis  . Hyperlipidemia  . Hypertension  . Atrial fibrillation  . Chronic anticoagulation  . Sleep apnea  . Degenerative joint disease  . Thrombocytopenia  . OA (osteoarthritis) of knee  . Postop Hyponatremia  . Acute blood loss anemia  . Knee stiffness  . Knee pain  . Difficulty in walking    PT - End of Session Activity Tolerance: Patient tolerated treatment well General Behavior During Therapy: WFL for tasks assessed/performed Cognition: WFL for tasks performed  GP    [name] 02/13/2013, 10:44 AM

## 2013-02-15 ENCOUNTER — Ambulatory Visit (HOSPITAL_COMMUNITY): Payer: Medicare Other | Admitting: Physical Therapy

## 2013-02-15 ENCOUNTER — Ambulatory Visit (HOSPITAL_COMMUNITY)
Admission: RE | Admit: 2013-02-15 | Discharge: 2013-02-15 | Disposition: A | Discharge status: Home / Self Care | Payer: Medicare Other | Source: Ambulatory Visit | Attending: Internal Medicine | Admitting: Internal Medicine

## 2013-02-15 ENCOUNTER — Other Ambulatory Visit: Payer: Self-pay | Admitting: Cardiology

## 2013-02-15 DIAGNOSIS — M25569 Pain in unspecified knee: Secondary | ICD-10-CM | POA: Diagnosis not present

## 2013-02-15 DIAGNOSIS — I10 Essential (primary) hypertension: Secondary | ICD-10-CM | POA: Diagnosis not present

## 2013-02-15 DIAGNOSIS — R262 Difficulty in walking, not elsewhere classified: Secondary | ICD-10-CM

## 2013-02-15 DIAGNOSIS — M25669 Stiffness of unspecified knee, not elsewhere classified: Secondary | ICD-10-CM | POA: Diagnosis not present

## 2013-02-15 DIAGNOSIS — IMO0001 Reserved for inherently not codable concepts without codable children: Secondary | ICD-10-CM | POA: Diagnosis not present

## 2013-02-15 NOTE — Telephone Encounter (Signed)
rx sent to pharmacy by e-script  

## 2013-02-15 NOTE — Progress Notes (Signed)
Physical Therapy Treatment Patient Details  Name: Joshua [name] MRN: [id number] Date of Birth: 1941/06/19  Today's Date: 02/15/2013 Time: 7829-5621 PT Time Calculation (min): 57 min Charge: Manual 15'. Ice x 10', therex 32'  Visit#: 5 of 12  Re-eval: 03/08/13 Assessment Diagnosis: Rt TKR Surgical Date: 01/14/13 Next MD Visit: Dr. Despina Hick - 02/22/13 Prior Therapy: SNF 2 weeks  Authorization: Medicare  Authorization Time Period:    Authorization Visit#: 5 of 10   Subjective: Symptoms/Limitations Symptoms: Pt reported Rt knee can tell it is supposed to pain today.  Pain scale 4/10 Pain Assessment Currently in Pain?: Yes Pain Score:   4 Pain Location: Knee Pain Orientation: Right  Precautions/Restrictions  Precautions Precautions: Knee  Exercise/Treatments Stretches Active Hamstring Stretch: 3 reps;30 seconds;Limitations Active Hamstring Stretch Limitations: w/ rope Quad Stretch: 3 reps;30 seconds Gastroc Stretch: 3 reps;30 seconds;Limitations Gastroc Stretch Limitations: slant board Aerobic Stationary Bike: 8' @2 .0 seat 11 to improve ROM Standing Heel Raises: Limitations Heel Raises Limitations: heel walking 2RT Supine Quad Sets: 10 reps Terminal Knee Extension: 20 reps;Limitations Terminal Knee Extension Limitations: 10 second holds Knee Extension: PROM Knee Flexion: PROM Prone  Prone Knee Hang: 5 minutes;Weights;Limitations Prone Knee Hang Weights (lbs): 3 Prone Knee Hang Limitations: MFR to hamstrings Other Prone Exercises: TKR 15x 10"   Modalities Modalities: Cryotherapy Manual Therapy Manual Therapy: Joint mobilization Joint Mobilization: Grade I-III patella mobs and tib/fib Myofascial Release: MFR to Rt knee to decrease adhesions Cryotherapy Number Minutes Cryotherapy: 10 Minutes Cryotherapy Location: Knee Type of Cryotherapy: Ice pack  Physical Therapy Assessment and Plan PT Assessment and Plan Clinical Impression Statement: Continues session  focus on quad strengthening, improve ROM and manual techniques to improve joint mobs and reduce adhesions.  Improved A/PROM both flexion and extension.  Ice applied end of session for pain and edema control. PT Plan: Continue with manual and modalities to help decrease edema to RLE.  Continue with quad sets, prone hange, supine and standing TKE to promote extension, gait training with SPC.  Progress to stair training.     Goals    Problem List Patient Active Problem List  Diagnosis  . Hyperlipidemia  . Hypertension  . Atrial fibrillation  . Chronic anticoagulation  . Sleep apnea  . Degenerative joint disease  . Thrombocytopenia  . OA (osteoarthritis) of knee  . Postop Hyponatremia  . Acute blood loss anemia  . Knee stiffness  . Knee pain  . Difficulty in walking    PT - End of Session Activity Tolerance: Patient tolerated treatment well General Behavior During Therapy: WFL for tasks assessed/performed Cognition: WFL for tasks performed  GP    [name] 02/15/2013, 12:29 PM

## 2013-02-18 ENCOUNTER — Ambulatory Visit (HOSPITAL_COMMUNITY)
Admission: RE | Admit: 2013-02-18 | Discharge: 2013-02-18 | Disposition: A | Discharge status: Home / Self Care | Payer: Medicare Other | Source: Ambulatory Visit | Attending: Internal Medicine | Admitting: Internal Medicine

## 2013-02-18 DIAGNOSIS — I10 Essential (primary) hypertension: Secondary | ICD-10-CM | POA: Diagnosis not present

## 2013-02-18 DIAGNOSIS — M25569 Pain in unspecified knee: Secondary | ICD-10-CM | POA: Diagnosis not present

## 2013-02-18 DIAGNOSIS — R262 Difficulty in walking, not elsewhere classified: Secondary | ICD-10-CM

## 2013-02-18 DIAGNOSIS — M25669 Stiffness of unspecified knee, not elsewhere classified: Secondary | ICD-10-CM | POA: Diagnosis not present

## 2013-02-18 DIAGNOSIS — IMO0001 Reserved for inherently not codable concepts without codable children: Secondary | ICD-10-CM | POA: Diagnosis not present

## 2013-02-18 NOTE — Progress Notes (Signed)
Physical Therapy Treatment Patient Details  Name: Joshua [name] MRN: [id number] Date of Birth: 1941-05-14  Today's Date: 02/18/2013 Time: 1015-1112 PT Time Calculation (min): 57 min Charge: MMT x 1, ROM Measurement x 1, Therex 38'  Visit#: 6 of 12  Re-eval: 03/08/13 Assessment Diagnosis: Rt TKR Surgical Date: 01/14/13 Next MD Visit: Dr. Despina Hick - 02/22/13 Prior Therapy: SNF 2 weeks  Authorization: Medicare  Authorization Time Period:    Authorization Visit#: 6 of 10   Subjective: Symptoms/Limitations Symptoms: Pt reported Rt knee feeling tight and stiff today, pain scale 4/10.  Pt reported completeing prone knee hang 3x daily. Pain Assessment Currently in Pain?: Yes Pain Score:   4 Pain Location: Knee Pain Orientation: Right  Precautions/Restrictions  Precautions Precautions: Knee  Exercise/Treatments Stretches Active Hamstring Stretch: 3 reps;30 seconds;Limitations Active Hamstring Stretch Limitations: w/ rope Quad Stretch: 3 reps;30 seconds Gastroc Stretch: 3 reps;30 seconds;Limitations Gastroc Stretch Limitations: slant board Aerobic Stationary Bike: 8' @2 .0 seat 11 to improve ROM Standing Heel Raises: Limitations Heel Raises Limitations: heel walking 2RT Terminal Knee Extension: 15 reps;Theraband;Limitations Theraband Level (Terminal Knee Extension): Level 4 (Blue) Terminal Knee Extension Limitations: 10 sec holds Supine Terminal Knee Extension: 20 reps;Limitations Terminal Knee Extension Limitations: 10 second holds Knee Extension: PROM Knee Flexion: PROM Prone  Prone Knee Hang: 5 minutes;Weights;Limitations Prone Knee Hang Weights (lbs): 3 Prone Knee Hang Limitations: MFR to hamstrings Other Prone Exercises: TKR 15x 10"   Modalities Modalities: Cryotherapy Manual Therapy Manual Therapy: Joint mobilization Joint Mobilization: Grade I-III patella mobs and tib/fib Myofascial Release: MFR to Rt knee to decrease adhesions Cryotherapy Number Minutes  Cryotherapy: 10 Minutes Cryotherapy Location: Knee Type of Cryotherapy: Ice pack  Physical Therapy Assessment and Plan PT Assessment and Plan Clinical Impression Statement: Continued session focus on quad strengthening, therex and manual techniques to improve ROM and joint mobility.  Improved tolerance with prone knee hang with noted improvement with extension.  Ice applied end of session for pain and edema control. PT Plan: Continue with manual and modalities to help decrease edema to RLE.  Continue with quad sets, prone hange, supine and standing TKE to promote extension, gait training with SPC.  Progress to stair training.     Goals    Problem List Patient Active Problem List  Diagnosis  . Hyperlipidemia  . Hypertension  . Atrial fibrillation  . Chronic anticoagulation  . Sleep apnea  . Degenerative joint disease  . Thrombocytopenia  . OA (osteoarthritis) of knee  . Postop Hyponatremia  . Acute blood loss anemia  . Knee stiffness  . Knee pain  . Difficulty in walking    PT - End of Session Activity Tolerance: Patient tolerated treatment well General Behavior During Therapy: WFL for tasks assessed/performed Cognition: WFL for tasks performed  GP    [name] 02/18/2013, 5:20 PM

## 2013-02-19 ENCOUNTER — Ambulatory Visit (HOSPITAL_COMMUNITY): Payer: Medicare Other

## 2013-02-20 ENCOUNTER — Ambulatory Visit (INDEPENDENT_AMBULATORY_CARE_PROVIDER_SITE_OTHER): Payer: Medicare Other | Admitting: *Deleted

## 2013-02-20 ENCOUNTER — Ambulatory Visit (HOSPITAL_COMMUNITY)
Admission: RE | Admit: 2013-02-20 | Discharge: 2013-02-20 | Disposition: A | Discharge status: Home / Self Care | Payer: Medicare Other | Source: Ambulatory Visit | Attending: Internal Medicine | Admitting: Internal Medicine

## 2013-02-20 DIAGNOSIS — R262 Difficulty in walking, not elsewhere classified: Secondary | ICD-10-CM | POA: Diagnosis not present

## 2013-02-20 DIAGNOSIS — I4891 Unspecified atrial fibrillation: Secondary | ICD-10-CM | POA: Diagnosis not present

## 2013-02-20 DIAGNOSIS — Z7901 Long term (current) use of anticoagulants: Secondary | ICD-10-CM

## 2013-02-20 DIAGNOSIS — M25669 Stiffness of unspecified knee, not elsewhere classified: Secondary | ICD-10-CM | POA: Diagnosis not present

## 2013-02-20 DIAGNOSIS — I10 Essential (primary) hypertension: Secondary | ICD-10-CM | POA: Diagnosis not present

## 2013-02-20 DIAGNOSIS — M25569 Pain in unspecified knee: Secondary | ICD-10-CM | POA: Diagnosis not present

## 2013-02-20 DIAGNOSIS — IMO0001 Reserved for inherently not codable concepts without codable children: Secondary | ICD-10-CM | POA: Diagnosis not present

## 2013-02-20 NOTE — Progress Notes (Signed)
Physical Therapy Treatment Patient Details  Name: Joshua [name] MRN: [id number] Date of Birth: [birth year]  Today's Date: 02/20/2013 Time: 1015-1110 PT Time Calculation (min): 55 min  Visit#: 7 of 12  Re-eval: 03/08/13 Charges: Therex x 26' Manual x 12' Ice x 10'  Authorization: Medicare  Authorization Visit#: 7 of 10   Subjective: Symptoms/Limitations Symptoms: Pt states that knee is sore secondary to rain. Pain Assessment Currently in Pain?: Yes Pain Score:   4 Pain Location: Knee Pain Orientation: Right   Exercise/Treatments Standing Heel Raises Limitations: heel walking 2RT Terminal Knee Extension: 15 reps;Theraband;Limitations Theraband Level (Terminal Knee Extension): Level 4 (Blue) Terminal Knee Extension Limitations: 10 sec holds Wall Squat: 10 reps;5 seconds Supine Quad Sets: 10 reps Short Arc Quad Sets: 15 reps Knee Extension: PROM Knee Flexion: PROM   Modalities Modalities: Cryotherapy Manual Therapy Manual Therapy: Joint mobilization Joint Mobilization: Grade I-III tib/fib mobs completed to improve motion/decrease pain  Myofascial Release: MFR to right knee to decrease adhesions Cryotherapy Number Minutes Cryotherapy: 10 Minutes Cryotherapy Location: Knee (Right) Type of Cryotherapy: Ice pack  Physical Therapy Assessment and Plan PT Assessment and Plan Clinical Impression Statement: Pt completes therex well with minimal need for cuing for technique. Pt requires multimodal cueing to isolate quad contraction with standing TKE. Manual techniques completed to right knee to decrease tightness and adhesions. PT Plan: Continue with manual and modalities to help decrease edema to RLE.  Continue with quad sets, prone hange, supine and standing TKE to promote extension, gait training with SPC.  Progress to stair training.      Problem List Patient Active Problem List   Diagnosis Date Noted  . Knee stiffness 02/06/2013  . Knee pain 02/06/2013  .  Difficulty in walking 02/06/2013  . Acute blood loss anemia 01/17/2013  . Postop Hyponatremia 01/15/2013  . OA (osteoarthritis) of knee 01/14/2013  . Thrombocytopenia 05/26/2011  . Sleep apnea   . Degenerative joint disease   . Chronic anticoagulation 02/02/2011  . Hyperlipidemia 07/29/2010  . Atrial fibrillation 07/29/2010  . Hypertension 12/07/2007    PT - End of Session Activity Tolerance: Patient tolerated treatment well General Behavior During Therapy: [facility] for tasks assessed/performed Cognition: WFL for tasks performed   Seth Bake, PTA 02/20/2013, 11:39 AM

## 2013-02-21 ENCOUNTER — Ambulatory Visit (HOSPITAL_COMMUNITY)
Admission: RE | Admit: 2013-02-21 | Discharge: 2013-02-21 | Disposition: A | Discharge status: Home / Self Care | Payer: Medicare Other | Source: Ambulatory Visit | Attending: Internal Medicine | Admitting: Internal Medicine

## 2013-02-21 DIAGNOSIS — M25669 Stiffness of unspecified knee, not elsewhere classified: Secondary | ICD-10-CM | POA: Insufficient documentation

## 2013-02-21 DIAGNOSIS — IMO0001 Reserved for inherently not codable concepts without codable children: Secondary | ICD-10-CM | POA: Diagnosis not present

## 2013-02-21 DIAGNOSIS — I4891 Unspecified atrial fibrillation: Secondary | ICD-10-CM | POA: Diagnosis not present

## 2013-02-21 DIAGNOSIS — R262 Difficulty in walking, not elsewhere classified: Secondary | ICD-10-CM | POA: Diagnosis not present

## 2013-02-21 DIAGNOSIS — M25569 Pain in unspecified knee: Secondary | ICD-10-CM | POA: Insufficient documentation

## 2013-02-21 DIAGNOSIS — I10 Essential (primary) hypertension: Secondary | ICD-10-CM | POA: Diagnosis not present

## 2013-02-21 NOTE — Evaluation (Addendum)
Physical Therapy Re-evaluation/treatment  Patient Details  Name: Joshua [name] MRN: [id number] Date of Birth: Jan 07, 1941  Today's Date: 02/21/2013 Time: 4696-2952 PT Time Calculation (min): 60 min Charge: MMT x 1, ROM x 1, Manual x 15', therex 30', Ice x 15'              Visit#: 8 of 12  Re-eval: 03/08/13 Assessment Diagnosis: Rt TKR Surgical Date: 01/14/13 Next MD Visit: Dr. Despina Hick - 02/22/13 Prior Therapy: SNF 2 weeks  Authorization: Medicare    Authorization Time Period:    Authorization Visit#: 8 of 10    Subjective Symptoms/Limitations Symptoms: "My knee has a little kick to it today", pain scale 2/10 How long can you sit comfortably?: Able to sit for 60 minutes but knees to continue adjusting position, 10 minutes comfortably How long can you stand comfortably?: 30 minutes How long can you walk comfortably?: walks independently for 15-20 comfortably Pain Assessment Currently in Pain?: Yes Pain Score:   2 Pain Location: Knee Pain Orientation: Right  Objective:   Sensation/Coordination/Flexibility/Functional Tests Functional Tests Functional Tests: Lower Extremity Functional Scale (LEFS): 50/80 (was Lower Extremity Functional Scale (LEFS): 45/80)  RLE AROM (degrees) Right Knee Extension: 4 Right Knee Flexion: 110 RLE PROM (degrees) Right Knee Extension: 1 Right Knee Flexion: 115 RLE Strength Right Hip Extension:  (4+/5 was 4/5)  Exercise/Treatments Stretches Active Hamstring Stretch: 3 reps;30 seconds;Limitations Active Hamstring Stretch Limitations: w/ rope Quad Stretch: 3 reps;30 seconds Gastroc Stretch: 3 reps;30 seconds;Limitations Gastroc Stretch Limitations: slant board Aerobic Stationary Bike: 10' @2 .0 seat 11 to improve ROM Standing Heel Raises Limitations: heel walking 2RT Terminal Knee Extension: 15 reps;Theraband;Limitations Theraband Level (Terminal Knee Extension): Level 4 (Blue) Terminal Knee Extension Limitations: 10 sec holds Wall  Squat: 10 reps;5 seconds Supine Quad Sets: 15 reps;Limitations Quad Sets Limitations: 10" holds Short Arc Quad Sets: 15 reps Terminal Knee Extension: 20 reps;Limitations Terminal Knee Extension Limitations: 10 second holds Knee Extension: PROM Knee Flexion: PROM Prone  Other Prone Exercises: TKR 15x 10"   Modalities Modalities: Cryotherapy Manual Therapy Manual Therapy: Joint mobilization Joint Mobilization: Grade I-III tib/fib mobs completed to improve motion/decrease pain Myofascial Release: MFR to right knee to decrease adhesions Cryotherapy Number Minutes Cryotherapy: 10 Minutes Cryotherapy Location: Knee Type of Cryotherapy: Ice pack  Physical Therapy Assessment and Plan PT Assessment and Plan Clinical Impression Statement: Re-eval complete prior MD apt tomorrow.  Joshua [name] has had 8 OPPT sessions over 4 weeks with the following findings:  Pt is independent with HEP 2x daily.  Pt has met 2/3 STGs and progressing towards 2 LTGs.  Improved AROM 4-110 with increase ease with sit to stands.  Pt with improved balance and confidence with gait with no AD indoor or outdoords.  Decreased overall ecchymosis and overall edema to distal quadriceps.  Pt with improved perceived LE functional scale, increased by 5 points since initial eval.  Pt progressing well towards increase AROM to [facility] in order to walk with appropriate body mechanics. Frequency: 2x/week Duration: 4 weeks PT Plan: Recommend continuing OPPT x 4 more weeks to reach remaining deficits.      Goals Home Exercise Program: Independently Met PT Short Term Goalss: 3 weeks PT Short Term Goal 1: Pt will improve his Rt knee AROM 5-110 degrees for greater ease with sit to stand activities: Met (AROM: 4-110) PT Short Term Goal 2: Pt will improve his balance confidence and begin to ambulate independently on outdoor surfaces. : Met PT Short Term Goal 3: Pt will present  with decreased ecchymosis and swelling to RLE in order to  improve distal quadricep activitation.: Progressing toward goal PT Long Term Goals:  8 weeks PT Long Term Goal 1: Pt will improve his LEFS to 60/80 for improved percieved functional ability. : Progressing toward goal (50/80) PT Long Term Goal 2: Pt will improve his LE AROM to [facility] in order to ambulate with approrpriate body mechanics to decrease risk of secondary impairments. : Progressing toward goal  Problem List Patient Active Problem List   Diagnosis Date Noted  . Knee stiffness 02/06/2013  . Knee pain 02/06/2013  . Difficulty in walking 02/06/2013  . Acute blood loss anemia 01/17/2013  . Postop Hyponatremia 01/15/2013  . OA (osteoarthritis) of knee 01/14/2013  . Thrombocytopenia 05/26/2011  . Sleep apnea   . Degenerative joint disease   . Chronic anticoagulation 02/02/2011  . Hyperlipidemia 07/29/2010  . Atrial fibrillation 07/29/2010  . Hypertension 12/07/2007    PT - End of Session Activity Tolerance: Patient tolerated treatment well General Behavior During Therapy: WFL for tasks assessed/performed Cognition: WFL for tasks performed  GP Functional Assessment Tool Used: LEFS: 50/80 Mobility: Walking and Moving Around Current Status ([id number]): At least 20 percent but less than 40 percent impaired, limited or restricted Mobility: Walking and Moving Around Goal Status [phone]): At least 1 percent but less than 20 percent impaired, limited or restricted  [name], PTA/Twana Wileman, MPT, ATC 02/21/2013, 4:30 PM

## 2013-02-22 ENCOUNTER — Ambulatory Visit (HOSPITAL_COMMUNITY): Payer: Medicare Other | Admitting: *Deleted

## 2013-02-22 ENCOUNTER — Ambulatory Visit (HOSPITAL_COMMUNITY): Payer: Medicare Other | Admitting: Physical Therapy

## 2013-02-22 DIAGNOSIS — Z96659 Presence of unspecified artificial knee joint: Secondary | ICD-10-CM | POA: Diagnosis not present

## 2013-02-25 ENCOUNTER — Ambulatory Visit (HOSPITAL_COMMUNITY)
Admission: RE | Admit: 2013-02-25 | Discharge: 2013-02-25 | Disposition: A | Discharge status: Home / Self Care | Payer: Medicare Other | Source: Ambulatory Visit | Attending: Internal Medicine | Admitting: Internal Medicine

## 2013-02-25 NOTE — Progress Notes (Signed)
Physical Therapy Treatment Patient Details  Name: Joshua [name] MRN: [id number] Date of Birth: Sep 07, 1941  Today's Date: 02/25/2013 Time: 1015-1115 PT Time Calculation (min): 60 min Visit#: 9 of 18  Re-eval: 03/08/13 Charges:  therex 35', manual 10', ice 10' Authorization: Medicare  Authorization Visit#: 9 of 18   Subjective: Symptoms/Limitations Symptoms: Pt. states the stiffness remains, states he wakes at night alot with discomfort.  Pt reports MD is pleased with his progress.   Exercise/Treatments Stretches Active Hamstring Stretch: 3 reps;30 seconds;Limitations Active Hamstring Stretch Limitations: w/ rope Quad Stretch: 3 reps;30 seconds Gastroc Stretch: 3 reps;30 seconds;Limitations Gastroc Stretch Limitations: slant board Aerobic Stationary Bike: 10' @2 .0 seat 11 to improve ROM Standing Heel Raises Limitations: heel walking 2RT Terminal Knee Extension: 20 reps Theraband Level (Terminal Knee Extension): Level 4 (Blue) Terminal Knee Extension Limitations: 10 sec holds Wall Squat: 15 reps Supine Quad Sets: 15 reps;Limitations Short Arc Quad Sets: 15 reps Terminal Knee Extension: 20 reps;Limitations Terminal Knee Extension Limitations: 10 second holds Knee Extension: PROM Knee Flexion: PROM Prone  Hamstring Curl: 15 reps;Limitations Hamstring Curl Limitations: terminal ROM Prone Knee Hang Limitations: MFR to hamstrings Other Prone Exercises: TKR 15x 10"    Modalities Modalities: Cryotherapy Manual Therapy Manual Therapy: Joint mobilization Myofascial Release: MFR to right knee to decrease adhesions Cryotherapy Number Minutes Cryotherapy: 10 Minutes Cryotherapy Location: Knee Type of Cryotherapy: Ice pack  Physical Therapy Assessment and Plan PT Assessment and Plan Clinical Impression Statement: Continued tightness/adhesions posterior medial knee and anterior knee with erythema.  Pt. compliant with HEP and icing at home.   Continues to require vc's with  correct form with therex.   PT Plan: Recommend continuing OPPT x 4 more weeks to reach remaining deficits.         Lurena Nida, PTA/CLT 02/25/2013, 11:12 AM

## 2013-02-26 ENCOUNTER — Ambulatory Visit (HOSPITAL_COMMUNITY): Payer: Medicare Other | Admitting: Physical Therapy

## 2013-02-27 ENCOUNTER — Ambulatory Visit (HOSPITAL_COMMUNITY)
Admission: RE | Admit: 2013-02-27 | Discharge: 2013-02-27 | Disposition: A | Discharge status: Home / Self Care | Payer: Medicare Other | Source: Ambulatory Visit | Attending: Internal Medicine | Admitting: Internal Medicine

## 2013-02-27 DIAGNOSIS — R262 Difficulty in walking, not elsewhere classified: Secondary | ICD-10-CM

## 2013-02-27 NOTE — Progress Notes (Signed)
Physical Therapy Treatment Patient Details  Name: Joshua [name] MRN: [id number] Date of Birth: December 13, 1940  Today's Date: 02/27/2013 Time: 4098-1191 PT Time Calculation (min): 60 min Charges: 15' TE, 35' Manual, 1 ice Visit#: 10 of 18  Re-eval: 03/08/13    Authorization: Medicare  Authorization Time Period:    Authorization Visit#: 10 of 18   Subjective: Symptoms/Limitations Symptoms: He reports that overall things are going well and he felt like his muscle was really tight to the back of his leg.  Pain Assessment Currently in Pain?: Yes Pain Score:   3 Pain Location: Knee  Precautions/Restrictions     Exercise/Treatments Stretches Gastroc Stretch: 3 reps;30 seconds;Limitations Gastroc Stretch Limitations: slant board Aerobic Stationary Bike: 8 minutes @ 5.0 seat 11 Machines for Strengthening Cybex Knee Extension: 3 PL rt eccentric lowering 15 reps  Modalities Modalities: Cryotherapy Manual Therapy Manual Therapy: Joint mobilization Myofascial Release: MFR using hawk grips in prone and supine to gastroc and rt knee to decrease fascial restricitons.  Cryotherapy Number Minutes Cryotherapy: 10 Minutes Cryotherapy Location: Knee  Physical Therapy Assessment and Plan PT Assessment and Plan Clinical Impression Statement: Concentrated on decreasing pain and fascial restrictions to gastroc and anterior knee region. Added leg strengthening using machines to encourage use at the [facility].  Discussed continuing with activities at the gym to encourage LE strength and function.  Able to lay RLE flat on surface in prone and overall has decreased swelling.  PT Plan: Continue with manual therapy to gastroc/knee to decrease fascial restriction snd improve active knee extension. Encourage activiites at the gym.     Goals    Problem List Patient Active Problem List   Diagnosis Date Noted  . Knee stiffness 02/06/2013  . Knee pain 02/06/2013  . Difficulty in walking 02/06/2013  .  Acute blood loss anemia 01/17/2013  . Postop Hyponatremia 01/15/2013  . OA (osteoarthritis) of knee 01/14/2013  . Thrombocytopenia 05/26/2011  . Sleep apnea   . Degenerative joint disease   . Chronic anticoagulation 02/02/2011  . Hyperlipidemia 07/29/2010  . Atrial fibrillation 07/29/2010  . Hypertension 12/07/2007    PT - End of Session Activity Tolerance: Patient tolerated treatment well General Behavior During Therapy: WFL for tasks assessed/performed Cognition: WFL for tasks performed PT Plan of Care PT Patient Instructions: Encouraged to continue at the gym with strengthening.  Consulted and Agree with Plan of Care: Patient  GP    Ajai Terhaar, MPT, ATC 02/27/2013, 12:05 PM

## 2013-03-01 ENCOUNTER — Ambulatory Visit (HOSPITAL_COMMUNITY): Payer: Medicare Other

## 2013-03-01 ENCOUNTER — Ambulatory Visit (HOSPITAL_COMMUNITY)
Admission: RE | Admit: 2013-03-01 | Discharge: 2013-03-01 | Disposition: A | Discharge status: Home / Self Care | Payer: Medicare Other | Source: Ambulatory Visit | Attending: Internal Medicine | Admitting: Internal Medicine

## 2013-03-01 DIAGNOSIS — R262 Difficulty in walking, not elsewhere classified: Secondary | ICD-10-CM

## 2013-03-01 NOTE — Progress Notes (Signed)
Physical Therapy Treatment Patient Details  Name: Joshua [name] MRN: [id number] Date of Birth: Jan 18, 1941  Today's Date: 03/01/2013 Time: 1010-1058 PT Time Calculation (min): 48 min Charge: Therex 18', Manual x 30'  Visit#: 11 of 18  Re-eval: 03/08/13 Assessment Diagnosis: Rt TKR Surgical Date: 01/14/13 Next MD Visit: Dr. Despina Hick -  Prior Therapy: SNF 2 weeks  Authorization: Medicare  Authorization Time Period:    Authorization Visit#: 11 of 18   Subjective: Symptoms/Limitations Symptoms: Pt reported knee feeling fine if he could get rid of that "charlie's horse on calf."  Gastroc pain 2/10 today.  Pt reported going to the [employer] yesterday using the leg strengthening machines. Pain Assessment Currently in Pain?: Yes Pain Score:   2 Pain Location: Calf Pain Orientation: Right  Precautions/Restrictions  Precautions Precautions: Knee  Exercise/Treatments Stretches Gastroc Stretch: 3 reps;30 seconds;Limitations Gastroc Stretch Limitations: slant board Aerobic Stationary Bike: 8 minutes @ 5.0 seat 11 Machines for Strengthening Cybex Knee Extension: 3 PL rt eccentric lowering 15 reps Cybex Knee Flexion: 5PL 15 reps   Manual Therapy Manual Therapy: Joint mobilization Joint Mobilization: Grade I-III tib/fib mobs completed to improve motion/decrease pain Myofascial Release: MFR using hawk grips in prone and supine to gastroc and rt knee to decrease fascial restricitons.  Physical Therapy Assessment and Plan PT Assessment and Plan Clinical Impression Statement: Continued manual techniques to decrease fascial restrictions to gastrocnemiius and anterior knee region.  AROM 0 degrees extension in prone position. PT Plan: Continue with manual therapy to gastroc/knee to decrease fascial restriction snd improve active knee extension. Encourage activiites at the gym.     Goals    Problem List Patient Active Problem List   Diagnosis Date Noted  . Knee stiffness 02/06/2013  .  Knee pain 02/06/2013  . Difficulty in walking 02/06/2013  . Acute blood loss anemia 01/17/2013  . Postop Hyponatremia 01/15/2013  . OA (osteoarthritis) of knee 01/14/2013  . Thrombocytopenia 05/26/2011  . Sleep apnea   . Degenerative joint disease   . Chronic anticoagulation 02/02/2011  . Hyperlipidemia 07/29/2010  . Atrial fibrillation 07/29/2010  . Hypertension 12/07/2007    PT - End of Session Activity Tolerance: Patient tolerated treatment well General Behavior During Therapy: [facility] for tasks assessed/performed Cognition: WFL for tasks performed  GP    Joshua [name] 03/01/2013, 10:58 AM

## 2013-03-04 ENCOUNTER — Ambulatory Visit (HOSPITAL_COMMUNITY)
Admission: RE | Admit: 2013-03-04 | Discharge: 2013-03-04 | Disposition: A | Discharge status: Home / Self Care | Payer: Medicare Other | Source: Ambulatory Visit | Attending: Internal Medicine | Admitting: Internal Medicine

## 2013-03-04 NOTE — Progress Notes (Signed)
Physical Therapy Treatment Patient Details  Name: TRAVANTE KNEE MRN: [id number] Date of Birth: 13-Jun-1941  Today's Date: 03/04/2013 Time: 1005-1100 PT Time Calculation (min): 55 min  Visit#: 12 of 18  Re-eval: 03/08/13 Charges: Therex x 30' Manual x 12' Ice x 10'   Authorization: Medicare  Authorization Visit#: 12 of 18   Subjective: Symptoms/Limitations Symptoms: No pain, only sitffness. Pain Assessment Currently in Pain?: No/denies   Exercise/Treatments Stretches ITB Stretch: 1 rep;30 seconds;Limitations (w/rope) ITB Stretch Limitations: For HEP Gastroc Stretch: 3 reps;30 seconds;Limitations Gastroc Stretch Limitations: slant board Aerobic Stationary Bike: 8 minutes @ 5.0 seat 11 Machines for Strengthening Cybex Knee Extension: 3 PL rt eccentric lowering 2x15 reps Cybex Knee Flexion: 5PL 2x15 reps   Modalities Modalities: Cryotherapy Manual Therapy Myofascial Release: MFR using hawk grips in prone and supine to gastroc and rt knee to decrease fascial restricitons. Cryotherapy Number Minutes Cryotherapy: 10 Minutes Cryotherapy Location: Knee (Right) Type of Cryotherapy: Ice pack  Physical Therapy Assessment and Plan PT Assessment and Plan Clinical Impression Statement: Pt tolerates increases in reps with cybex machine well. MFR completed to hamstring, ITB, and distal incision to decrease tightness and adhesions. Instructed pt in supine ITB stretch for HEP to decrease tightness. PT Plan: Continue with manual therapy to gastroc/knee to decrease fascial restriction snd improve active knee extension. Encourage activiites at the gym.      Problem List Patient Active Problem List   Diagnosis Date Noted  . Knee stiffness 02/06/2013  . Knee pain 02/06/2013  . Difficulty in walking 02/06/2013  . Acute blood loss anemia 01/17/2013  . Postop Hyponatremia 01/15/2013  . OA (osteoarthritis) of knee 01/14/2013  . Thrombocytopenia 05/26/2011  . Sleep apnea   .  Degenerative joint disease   . Chronic anticoagulation 02/02/2011  . Hyperlipidemia 07/29/2010  . Atrial fibrillation 07/29/2010  . Hypertension 12/07/2007    PT - End of Session Activity Tolerance: Patient tolerated treatment well General Behavior During Therapy: [facility] for tasks assessed/performed Cognition: WFL for tasks performed  Seth Bake, PTA  03/04/2013, 11:06 AM

## 2013-03-05 ENCOUNTER — Ambulatory Visit (HOSPITAL_COMMUNITY): Payer: Medicare Other | Admitting: Physical Therapy

## 2013-03-05 ENCOUNTER — Ambulatory Visit (HOSPITAL_COMMUNITY)
Admission: RE | Admit: 2013-03-05 | Discharge: 2013-03-05 | Disposition: A | Discharge status: Home / Self Care | Payer: Medicare Other | Source: Ambulatory Visit | Attending: Internal Medicine | Admitting: Internal Medicine

## 2013-03-05 DIAGNOSIS — R262 Difficulty in walking, not elsewhere classified: Secondary | ICD-10-CM

## 2013-03-05 NOTE — Progress Notes (Signed)
Physical Therapy Treatment Patient Details  Name: Joshua [name] MRN: [id number] Date of Birth: [birth year]  Today's Date: 03/05/2013 Time: 6295-2841 PT Time Calculation (min): 52 min Charge: Therex 25', Manual x 15', Ice x 10  Visit#: 13 of 18  Re-eval: 03/08/13    Authorization: Medicare  Authorization Time Period:    Authorization Visit#: 13 of 18   Subjective: Symptoms/Limitations Symptoms: Pt reported hardest task continues to be the prone knee hang at home, up to complete 7 minutes at home.  Pt stated pain scale 1/10 musculature pain. Pain Assessment Currently in Pain?: Yes Pain Score:   1 Pain Location: Knee Pain Orientation: Right  Objective:  Exercise/Treatments Stretches Gastroc Stretch: 3 reps;30 seconds;Limitations Gastroc Stretch Limitations: slant board Aerobic Stationary Bike: NuStep hill #3 SPM >100 Machines for Strengthening Cybex Knee Extension: 4 PL rt eccentric lowering x15 reps Cybex Knee Flexion: 5.5 PL 2x15 reps Standing Heel Raises Limitations: heel walking 2RT Prone  Other Prone Exercises: TKR 15x 10"   Manual Therapy Manual Therapy: Myofascial release Joint Mobilization: Grade I-III tib/fib mobs completed to improve motion/decrease pain Myofascial Release: MFR to gastroc with hawk grip accesseries to reduce fascial restrictions. Cryotherapy Number Minutes Cryotherapy: 10 Minutes Cryotherapy Location: Knee Type of Cryotherapy: Ice pack  Physical Therapy Assessment and Plan PT Assessment and Plan Clinical Impression Statement: Changed aerobic to Nustep for strengthening and actvitiy tolerance.  Able to increase weight with cybex machines with noted visible musculature fatigue, able to complete with good control.  Continued manual MFR to gastroc region to reduce fascial restrictions.   PT Plan: Continue with manual therapy to gastroc/knee to decrease fascial restriction snd improve active knee extension. Encourage activiites at the gym.      Goals    Problem List Patient Active Problem List   Diagnosis Date Noted  . Knee stiffness 02/06/2013  . Knee pain 02/06/2013  . Difficulty in walking 02/06/2013  . Acute blood loss anemia 01/17/2013  . Postop Hyponatremia 01/15/2013  . OA (osteoarthritis) of knee 01/14/2013  . Thrombocytopenia 05/26/2011  . Sleep apnea   . Degenerative joint disease   . Chronic anticoagulation 02/02/2011  . Hyperlipidemia 07/29/2010  . Atrial fibrillation 07/29/2010  . Hypertension 12/07/2007    PT - End of Session Activity Tolerance: Patient tolerated treatment well General Behavior During Therapy: [facility] for tasks assessed/performed Cognition: WFL for tasks performed  GP    [name] 03/05/2013, 10:42 AM

## 2013-03-06 ENCOUNTER — Ambulatory Visit (HOSPITAL_COMMUNITY): Payer: Medicare Other

## 2013-03-08 ENCOUNTER — Ambulatory Visit (HOSPITAL_COMMUNITY): Payer: Medicare Other | Admitting: Physical Therapy

## 2013-03-08 ENCOUNTER — Inpatient Hospital Stay (HOSPITAL_COMMUNITY): Admission: RE | Admit: 2013-03-08 | Payer: Medicare Other | Source: Ambulatory Visit

## 2013-03-11 ENCOUNTER — Ambulatory Visit (INDEPENDENT_AMBULATORY_CARE_PROVIDER_SITE_OTHER): Payer: Medicare Other | Admitting: *Deleted

## 2013-03-11 ENCOUNTER — Ambulatory Visit (HOSPITAL_COMMUNITY)
Admission: RE | Admit: 2013-03-11 | Discharge: 2013-03-11 | Disposition: A | Discharge status: Home / Self Care | Payer: Medicare Other | Source: Ambulatory Visit | Attending: Internal Medicine | Admitting: Internal Medicine

## 2013-03-11 DIAGNOSIS — I4891 Unspecified atrial fibrillation: Secondary | ICD-10-CM | POA: Diagnosis not present

## 2013-03-11 DIAGNOSIS — Z7901 Long term (current) use of anticoagulants: Secondary | ICD-10-CM

## 2013-03-11 LAB — POCT INR: INR: 2.4

## 2013-03-11 NOTE — Progress Notes (Signed)
Physical Therapy Reassessment Patient Details  Name: Joshua [name] MRN: [id number] Date of Birth: 1941/02/19  Today's Date: 03/11/2013 Time: 7829-5621 PT Time Calculation (min): 42 min 1 MMT 1 ROM 25'therex              Visit#: 14 of 18  Re-eval: 03/08/13 Assessment Diagnosis: Rt TKR Surgical Date: 01/14/13 Next MD Visit: Dr. Despina Hick -  Prior Therapy: SNF 2 weeks  Authorization: Medicare       Authorization Visit#: 14 of 18   Past Medical History:  Past Medical History  Diagnosis Date  . Hypertension   . Hyperlipidemia   . Degenerative joint disease     Right knee  . Chronic anticoagulation   . Sleep apnea     Rx-CPAP  . Atrial fibrillation 06/2010  . Elevated cholesterol   . Shortness of breath     on exertion  . Nocturia   . BPH (benign prostatic hyperplasia)    Past Surgical History:  Past Surgical History  Procedure Laterality Date  . Ventral hernia repair  03/2008, 02/2009  . Colonoscopy  03/10/2005  . Total knee arthroplasty Right 01/14/2013    Procedure: TOTAL KNEE ARTHROPLASTY;  Surgeon: Loanne Drilling, MD;  Location: [facility];  Service: Orthopedics;  Laterality: Right;    Subjective Symptoms/Limitations Symptoms: no c/o pain only calf and anterior muscle "stiffness"  Precautions/Restrictions  Precautions Precautions: Knee   Assessment RLE AROM (degrees) Right Knee Extension: 6 was 15 Right Knee Flexion: 116 was 100 RLE Strength Right Hip Flexion: 5/5 Right Hip Extension: 5/5 was 4/5 Right Hip ABduction: 5/5 Right Knee Flexion: 5/5 Right Knee Extension: 5/5  Exercise/Treatments Mobility/Balance  Ambulation/Gait Assistive device: None   Stretches Gastroc Stretch: 3 reps;30 seconds;Limitations Gastroc Stretch Limitations: slant board Aerobic Stationary Bike: NuStep hill #3 SPM >100:8' Machines for Strengthening Cybex Knee Extension: 4 PL rt eccentric lowering x15 reps Cybex Knee Flexion: 5.5 PL 2x15 reps      Physical Therapy  Assessment and Plan PT Assessment and Plan Clinical Impression Statement: Patient has met all goals however his is still experiencing calf tightness  RLE as well as slight swelling and some heat which is limiiting his ROM. All affected strength is 5/5 now PT Plan: DC from OP therapy; pt has MD appt 6/5  Goals Home Exercise Program Pt will Perform Home Exercise Program: Independently PT Short Term Goals Time to Complete Short Term Goals: 3 weeks PT Short Term Goal 1: Pt will improve his Rt knee AROM 5-110 degrees for greater ease with sit to stand activities PT Short Term Goal 1 - Progress: Met PT Short Term Goal 2: Pt will improve his balance confidence and begin to ambulate independently on outdoor surfaces.  PT Short Term Goal 2 - Progress: Met PT Short Term Goal 3: Pt will present with decreased ecchymosis and swelling to RLE in order to improve distal quadricep activitation.  PT Short Term Goal 3 - Progress: Not met (Patient still has swelling and tightness in lower RLE) PT Long Term Goals Time to Complete Long Term Goals:  (6 weeks) PT Long Term Goal 1: Pt will improve his LEFS to 60/80 for improved percieved functional ability.  Was 45/80 PT Long Term Goal 1 - Progress: Met PT Long Term Goal 2: Pt will improve his LE AROM to [facility] in order to ambulate with approrpriate body mechanics to decrease risk of secondary impairments.  PT Long Term Goal 2 - Progress: Partly met  Problem List Patient Active Problem  List   Diagnosis Date Noted  . Knee stiffness 02/06/2013  . Knee pain 02/06/2013  . Difficulty in walking 02/06/2013  . Acute blood loss anemia 01/17/2013  . Postop Hyponatremia 01/15/2013  . OA (osteoarthritis) of knee 01/14/2013  . Thrombocytopenia 05/26/2011  . Sleep apnea   . Degenerative joint disease   . Chronic anticoagulation 02/02/2011  . Hyperlipidemia 07/29/2010  . Atrial fibrillation 07/29/2010  . Hypertension 12/07/2007    PT - End of Session Activity  Tolerance: Patient tolerated treatment well General Behavior During Therapy: WFL for tasks assessed/performed Cognition: WFL for tasks performed  GP Functional Assessment Tool Used: LEFS Functional Limitation: Mobility: Walking and moving around Mobility: Walking and Moving Around Current Status (E9528): At least 1 percent but less than 20 percent impaired, limited or restricted Mobility: Walking and Moving Around Goal Status [phone]): At least 1 percent but less than 20 percent impaired, limited or restricted Mobility: Walking and Moving Around Discharge Status [id number]): At least 1 percent but less than 20 percent impaired, limited or restricted  EAGLETON, REBECCA ATKINSO 03/11/2013, 9:43 AM  Physician Documentation Your signature is required to indicate approval of the treatment plan as stated above.  Please sign and either send electronically or make a copy of this report for your files and return this physician signed original.   Please mark one 1.__approve of plan  2. ___approve of plan with the following conditions.   ______________________________                                                          _____________________ Physician Signature                                                                                                             Date

## 2013-04-11 ENCOUNTER — Ambulatory Visit (INDEPENDENT_AMBULATORY_CARE_PROVIDER_SITE_OTHER): Payer: Medicare Other | Admitting: *Deleted

## 2013-04-11 DIAGNOSIS — Z7901 Long term (current) use of anticoagulants: Secondary | ICD-10-CM

## 2013-04-11 DIAGNOSIS — I4891 Unspecified atrial fibrillation: Secondary | ICD-10-CM

## 2013-05-23 ENCOUNTER — Ambulatory Visit (INDEPENDENT_AMBULATORY_CARE_PROVIDER_SITE_OTHER): Payer: Medicare Other | Admitting: *Deleted

## 2013-05-23 DIAGNOSIS — Z7901 Long term (current) use of anticoagulants: Secondary | ICD-10-CM

## 2013-05-23 DIAGNOSIS — I4891 Unspecified atrial fibrillation: Secondary | ICD-10-CM | POA: Diagnosis not present

## 2013-06-21 DIAGNOSIS — Z96659 Presence of unspecified artificial knee joint: Secondary | ICD-10-CM | POA: Diagnosis not present

## 2013-06-28 DIAGNOSIS — N400 Enlarged prostate without lower urinary tract symptoms: Secondary | ICD-10-CM | POA: Diagnosis not present

## 2013-06-28 DIAGNOSIS — I10 Essential (primary) hypertension: Secondary | ICD-10-CM | POA: Diagnosis not present

## 2013-07-03 ENCOUNTER — Ambulatory Visit (INDEPENDENT_AMBULATORY_CARE_PROVIDER_SITE_OTHER): Payer: Medicare Other | Admitting: *Deleted

## 2013-07-03 DIAGNOSIS — Z7901 Long term (current) use of anticoagulants: Secondary | ICD-10-CM | POA: Diagnosis not present

## 2013-07-03 DIAGNOSIS — I4891 Unspecified atrial fibrillation: Secondary | ICD-10-CM

## 2013-07-03 LAB — POCT INR: INR: 2.9

## 2013-08-08 ENCOUNTER — Ambulatory Visit (INDEPENDENT_AMBULATORY_CARE_PROVIDER_SITE_OTHER): Payer: Medicare Other | Admitting: *Deleted

## 2013-08-08 DIAGNOSIS — I4891 Unspecified atrial fibrillation: Secondary | ICD-10-CM

## 2013-08-08 DIAGNOSIS — Z7901 Long term (current) use of anticoagulants: Secondary | ICD-10-CM | POA: Diagnosis not present

## 2013-08-08 LAB — POCT INR: INR: 2.6

## 2013-08-21 DIAGNOSIS — Z23 Encounter for immunization: Secondary | ICD-10-CM | POA: Diagnosis not present

## 2013-09-17 ENCOUNTER — Other Ambulatory Visit: Payer: Self-pay | Admitting: Cardiology

## 2013-09-18 ENCOUNTER — Ambulatory Visit (INDEPENDENT_AMBULATORY_CARE_PROVIDER_SITE_OTHER): Payer: Medicare Other | Admitting: *Deleted

## 2013-09-18 DIAGNOSIS — Z7901 Long term (current) use of anticoagulants: Secondary | ICD-10-CM

## 2013-09-18 DIAGNOSIS — I4891 Unspecified atrial fibrillation: Secondary | ICD-10-CM

## 2013-09-18 LAB — POCT INR: INR: 2.2

## 2013-10-22 DIAGNOSIS — Z96659 Presence of unspecified artificial knee joint: Secondary | ICD-10-CM | POA: Diagnosis not present

## 2013-10-22 DIAGNOSIS — M25569 Pain in unspecified knee: Secondary | ICD-10-CM | POA: Diagnosis not present

## 2013-11-06 ENCOUNTER — Ambulatory Visit (INDEPENDENT_AMBULATORY_CARE_PROVIDER_SITE_OTHER): Payer: Medicare Other | Admitting: *Deleted

## 2013-11-06 DIAGNOSIS — I4891 Unspecified atrial fibrillation: Secondary | ICD-10-CM | POA: Diagnosis not present

## 2013-11-06 DIAGNOSIS — Z7901 Long term (current) use of anticoagulants: Secondary | ICD-10-CM | POA: Diagnosis not present

## 2013-11-06 LAB — POCT INR: INR: 2.8

## 2013-11-08 DIAGNOSIS — I4891 Unspecified atrial fibrillation: Secondary | ICD-10-CM | POA: Diagnosis not present

## 2013-11-08 DIAGNOSIS — Z79899 Other long term (current) drug therapy: Secondary | ICD-10-CM | POA: Diagnosis not present

## 2013-11-08 DIAGNOSIS — M1990 Unspecified osteoarthritis, unspecified site: Secondary | ICD-10-CM | POA: Diagnosis not present

## 2013-11-08 DIAGNOSIS — I10 Essential (primary) hypertension: Secondary | ICD-10-CM | POA: Diagnosis not present

## 2013-11-15 DIAGNOSIS — I4891 Unspecified atrial fibrillation: Secondary | ICD-10-CM | POA: Diagnosis not present

## 2013-11-15 DIAGNOSIS — I10 Essential (primary) hypertension: Secondary | ICD-10-CM | POA: Diagnosis not present

## 2013-11-18 ENCOUNTER — Encounter: Payer: Self-pay | Admitting: Cardiology

## 2013-12-20 DIAGNOSIS — M1710 Unilateral primary osteoarthritis, unspecified knee: Secondary | ICD-10-CM | POA: Diagnosis not present

## 2013-12-23 ENCOUNTER — Ambulatory Visit (INDEPENDENT_AMBULATORY_CARE_PROVIDER_SITE_OTHER): Payer: Medicare Other | Admitting: *Deleted

## 2013-12-23 ENCOUNTER — Ambulatory Visit (INDEPENDENT_AMBULATORY_CARE_PROVIDER_SITE_OTHER): Payer: Medicare Other | Admitting: Cardiology

## 2013-12-23 ENCOUNTER — Encounter: Payer: Self-pay | Admitting: Cardiology

## 2013-12-23 VITALS — BP 132/89 | HR 58 | Ht 71.0 in | Wt 230.0 lb

## 2013-12-23 DIAGNOSIS — E785 Hyperlipidemia, unspecified: Secondary | ICD-10-CM

## 2013-12-23 DIAGNOSIS — Z7901 Long term (current) use of anticoagulants: Secondary | ICD-10-CM | POA: Diagnosis not present

## 2013-12-23 DIAGNOSIS — I10 Essential (primary) hypertension: Secondary | ICD-10-CM | POA: Diagnosis not present

## 2013-12-23 DIAGNOSIS — I4891 Unspecified atrial fibrillation: Secondary | ICD-10-CM

## 2013-12-23 DIAGNOSIS — Z01810 Encounter for preprocedural cardiovascular examination: Secondary | ICD-10-CM

## 2013-12-23 DIAGNOSIS — Z5181 Encounter for therapeutic drug level monitoring: Secondary | ICD-10-CM | POA: Insufficient documentation

## 2013-12-23 LAB — POCT INR: INR: 2.9

## 2013-12-23 NOTE — Progress Notes (Signed)
Patient ID: Joshua [name], male   DOB: [birth year], 73 y.o.   MRN: [id number]      Clinical Summary Joshua [name] is a 73 y.o.male former patient of Dr Lattie Haw, this is our first visit together. He is seen for the following medical problems.  1. Afib - on chronic anticoag with coumadin, denies any bleeding issues - denies any recent palpitations.   2. Hyperlipidemia - was previously on prava, developed muscle aches. Stopped taking approx December, symptoms resolved. - reports he has tried multiple other statins with Dr Willey Blade, side effects with all.  - Jan 2015: TC 218 TG 230 HDL 38 LDL 134 - working on diet and excercise  3. HTN - does not check at home  - compliant with meds  4. Preoperative - patient is being considered for a knee replacment - reports he exercises daily, rides bicycle for 20-30 minutes. - denies any active cardiac symptoms.   Past Medical History  Diagnosis Date  . Hypertension   . Hyperlipidemia   . Degenerative joint disease     Right knee  . Chronic anticoagulation   . Sleep apnea     Rx-CPAP  . Atrial fibrillation 06/2010  . Elevated cholesterol   . Shortness of breath     on exertion  . Nocturia   . BPH (benign prostatic hyperplasia)      No Known Allergies   Current Outpatient Prescriptions  Medication Sig Dispense Refill  . acetaminophen (TYLENOL) 325 MG tablet Take 2 tablets (650 mg total) by mouth every 6 (six) hours as needed.  60 tablet  0  . amLODipine (NORVASC) 5 MG tablet Take 5 mg by mouth daily before breakfast.       . atenolol (TENORMIN) 50 MG tablet Take 100 mg by mouth 2 (two) times daily.      . digoxin (LANOXIN) 0.25 MG tablet Take 0.25 mg by mouth daily before breakfast.      . lisinopril (PRINIVIL,ZESTRIL) 20 MG tablet Take 20 mg by mouth 2 (two) times daily.      . pravastatin (PRAVACHOL) 40 MG tablet Take 40 mg by mouth every evening.      . warfarin (COUMADIN) 10 MG tablet TAKE ONE TABLET BY MOUTH ONCE DAILY EXCEPT  WEDNESDAY TAKE ONE-HALF TABLET OR AS DIRECTED BY ANTICOGULATION CLINIC  45 tablet  3   No current facility-administered medications for this visit.     Past Surgical History  Procedure Laterality Date  . Ventral hernia repair  03/2008, 02/2009  . Colonoscopy  03/10/2005  . Total knee arthroplasty Right 01/14/2013    Procedure: TOTAL KNEE ARTHROPLASTY;  Surgeon: Gearlean Alf, MD;  Location: [facility];  Service: Orthopedics;  Laterality: Right;     No Known Allergies    Family History  Problem Relation Age of Onset  . Heart attack Father      Social History Joshua [name] reports that he has been smoking Cigars.  He has never used smokeless tobacco. Joshua [name] reports that he does not drink alcohol.   Review of Systems CONSTITUTIONAL: No weight loss, fever, chills, weakness or fatigue.  HEENT: Eyes: No visual loss, blurred vision, double vision or yellow sclerae.No hearing loss, sneezing, congestion, runny nose or sore throat.  SKIN: No rash or itching.  CARDIOVASCULAR: per HPI RESPIRATORY: No shortness of breath, cough or sputum.  GASTROINTESTINAL: No anorexia, nausea, vomiting or diarrhea. No abdominal pain or blood.  GENITOURINARY: No burning on urination, no polyuria NEUROLOGICAL: No headache, dizziness,  syncope, paralysis, ataxia, numbness or tingling in the extremities. No change in bowel or bladder control.  MUSCULOSKELETAL: No muscle, back pain, joint pain or stiffness.  LYMPHATICS: No enlarged nodes. No history of splenectomy.  PSYCHIATRIC: No history of depression or anxiety.  ENDOCRINOLOGIC: No reports of sweating, cold or heat intolerance. No polyuria or polydipsia.  Marland Kitchen   Physical Examination p 58 bp 132/89 Wt 230 lbs BMI32 Gen: resting comfortably, no acute distress HEENT: no scleral icterus, pupils equal round and reactive, no palptable cervical adenopathy,  CV: irreg, no m/r/g, no JVD, no carotid bruits Resp: Clear to auscultation bilaterally GI: abdomen  is soft, non-tender, non-distended, normal bowel sounds, no hepatosplenomegaly MSK: extremities are warm, no edema.  Skin: warm, no rash Neuro:  no focal deficits Psych: appropriate affect   Diagnostic Studies Echo 09/2010: LVEF 55%, mild LVH, mild MR, mod LAE.   12/23/13 Clinic EKG Afib, rate 100, no ischemic changes  Assessment and Plan  1. Afib - no current symptoms, continue coumadin and rate control with atenolol and digoxin.   2. Hyperlipidemia - intolerant to statins, continue exercise and dietary modifcation  3. HTN - at goal, continue current meds  4. Preoperative evaluation - patient being considered for intermediate risk orthopedic surgery. He has no active acute cardiac conditions. He tolerates greater than 4 METs regularly without limitation (rides exercise bike daily 20-30 minutes). Recommend proceeding with planned surgery without any further cardiac testing or interventions - his coumadin will need to be bridged with lovenox for surgery, if assistance needed with this please contact our office.    Follow up 1 year   Arnoldo Lenis, M.D., F.A.C.C.

## 2013-12-23 NOTE — Patient Instructions (Signed)
Your physician recommends that you schedule a follow-up appointment in: 1 year with Dr Branch You will receive a reminder letter two months in advance reminding you to call and schedule your appointment. If you don't receive this letter, please contact our office.  Your physician recommends that you continue on your current medications as directed. Please refer to the Current Medication list given to you today.   

## 2014-01-22 DIAGNOSIS — D1801 Hemangioma of skin and subcutaneous tissue: Secondary | ICD-10-CM | POA: Diagnosis not present

## 2014-01-22 DIAGNOSIS — D235 Other benign neoplasm of skin of trunk: Secondary | ICD-10-CM | POA: Diagnosis not present

## 2014-01-22 DIAGNOSIS — L819 Disorder of pigmentation, unspecified: Secondary | ICD-10-CM | POA: Diagnosis not present

## 2014-01-22 DIAGNOSIS — Z85820 Personal history of malignant melanoma of skin: Secondary | ICD-10-CM | POA: Diagnosis not present

## 2014-02-05 ENCOUNTER — Ambulatory Visit (INDEPENDENT_AMBULATORY_CARE_PROVIDER_SITE_OTHER): Payer: Medicare Other | Admitting: *Deleted

## 2014-02-05 DIAGNOSIS — I4891 Unspecified atrial fibrillation: Secondary | ICD-10-CM | POA: Diagnosis not present

## 2014-02-05 DIAGNOSIS — Z5181 Encounter for therapeutic drug level monitoring: Secondary | ICD-10-CM

## 2014-02-05 DIAGNOSIS — Z7901 Long term (current) use of anticoagulants: Secondary | ICD-10-CM

## 2014-02-05 LAB — POCT INR: INR: 2.4

## 2014-02-17 NOTE — Progress Notes (Signed)
Surgery on 03/10/14.  Need orders in EPIC.  Thank You.  

## 2014-02-21 NOTE — Progress Notes (Signed)
Surgery on 03/10/14.  Preop on 02/28/14 at 100pm.  Need orders in EPIC.  Thank You.

## 2014-02-22 ENCOUNTER — Other Ambulatory Visit: Payer: Self-pay | Admitting: Orthopedic Surgery

## 2014-02-28 ENCOUNTER — Encounter (HOSPITAL_COMMUNITY): Payer: Self-pay | Admitting: Pharmacy Technician

## 2014-02-28 ENCOUNTER — Encounter (HOSPITAL_COMMUNITY)
Admission: RE | Admit: 2014-02-28 | Discharge: 2014-02-28 | Disposition: A | Discharge status: Home / Self Care | Payer: Medicare Other | Source: Ambulatory Visit | Attending: Orthopedic Surgery | Admitting: Orthopedic Surgery

## 2014-02-28 ENCOUNTER — Ambulatory Visit (HOSPITAL_COMMUNITY)
Admission: RE | Admit: 2014-02-28 | Discharge: 2014-02-28 | Disposition: A | Discharge status: Home / Self Care | Payer: Medicare Other | Source: Ambulatory Visit | Attending: Orthopedic Surgery | Admitting: Orthopedic Surgery

## 2014-02-28 ENCOUNTER — Encounter (HOSPITAL_COMMUNITY): Payer: Self-pay

## 2014-02-28 DIAGNOSIS — Z01812 Encounter for preprocedural laboratory examination: Secondary | ICD-10-CM | POA: Diagnosis not present

## 2014-02-28 DIAGNOSIS — M47814 Spondylosis without myelopathy or radiculopathy, thoracic region: Secondary | ICD-10-CM | POA: Diagnosis not present

## 2014-02-28 DIAGNOSIS — Z01818 Encounter for other preprocedural examination: Secondary | ICD-10-CM | POA: Diagnosis not present

## 2014-02-28 LAB — COMPREHENSIVE METABOLIC PANEL
ALT: 18 U/L (ref 0–53)
AST: 26 U/L (ref 0–37)
Albumin: 3.8 g/dL (ref 3.5–5.2)
Alkaline Phosphatase: 53 U/L (ref 39–117)
BUN: 13 mg/dL (ref 6–23)
CALCIUM: 9 mg/dL (ref 8.4–10.5)
CHLORIDE: 104 meq/L (ref 96–112)
CO2: 24 mEq/L (ref 19–32)
CREATININE: 0.92 mg/dL (ref 0.50–1.35)
GFR calc non Af Amer: 83 mL/min — ABNORMAL LOW (ref >90)
Glucose, Bld: 90 mg/dL (ref 70–99)
Potassium: 4.2 mEq/L (ref 3.7–5.3)
Sodium: 141 mEq/L (ref 137–147)
Total Bilirubin: 0.8 mg/dL (ref 0.3–1.2)
Total Protein: 7.1 g/dL (ref 6.0–8.3)

## 2014-02-28 LAB — CBC
HEMATOCRIT: 43.5 % (ref 39.0–52.0)
HEMOGLOBIN: 15 g/dL (ref 13.0–17.0)
MCH: 31.5 pg (ref 26.0–34.0)
MCHC: 34.5 g/dL (ref 30.0–36.0)
MCV: 91.4 fL (ref 78.0–100.0)
Platelets: 142 10*3/uL — ABNORMAL LOW (ref 150–400)
RBC: 4.76 MIL/uL (ref 4.22–5.81)
RDW: 12.7 % (ref 11.5–15.5)
WBC: 4.8 10*3/uL (ref 4.0–10.5)

## 2014-02-28 LAB — PROTIME-INR
INR: 2.76 — AB (ref 0.00–1.49)
Prothrombin Time: 28.2 seconds — ABNORMAL HIGH (ref 11.6–15.2)

## 2014-02-28 LAB — URINALYSIS, ROUTINE W REFLEX MICROSCOPIC
Bilirubin Urine: NEGATIVE
Glucose, UA: NEGATIVE mg/dL
KETONES UR: NEGATIVE mg/dL
NITRITE: NEGATIVE
Protein, ur: NEGATIVE mg/dL
Specific Gravity, Urine: 1.02 (ref 1.005–1.030)
Urobilinogen, UA: 1 mg/dL (ref 0.0–1.0)
pH: 5.5 (ref 5.0–8.0)

## 2014-02-28 LAB — APTT: aPTT: 41 seconds — ABNORMAL HIGH (ref 24–37)

## 2014-02-28 LAB — URINE MICROSCOPIC-ADD ON

## 2014-02-28 LAB — SURGICAL PCR SCREEN
MRSA, PCR: NEGATIVE
Staphylococcus aureus: NEGATIVE

## 2014-02-28 NOTE — Patient Instructions (Signed)
[place]  02/28/2014   Your procedure is scheduled on:  5-8 -2015  Enter through [facility] Entrance and follow signs to [facility]. Arrive at   0515     AM .  Call this number if you have problems the morning of surgery: [phone]  Or Presurgical Testing [phone]([place]) For Living Will and/or Health Care Power Attorney Forms: please provide copy for your medical record,may bring AM of surgery(Forms should be already notarized -we do not provide this service).(02-28-14 No information preferred today).   For Cpap use: Bring mask and tubing only.   Do not eat food:After Midnight.   Take these medicines the morning of surgery with A SIP OF WATER: Amlodipine. Atenolol. Digoxin. Use Coumadin/Lovenox as directed by Doctor.   Do not wear jewelry, make-up or nail polish.  Do not wear lotions, powders, or perfumes. You may wear deodorant.  Do not shave 48 hours(2 days) prior to first CHG shower(legs and under arms).(Shaving face and neck okay.)  Do not bring valuables to the hospital.(Hospital is not responsible for lost valuables).  Contacts, dentures or removable bridgework, body piercing, hair pins may not be worn into surgery.  Leave suitcase in the car. After surgery it may be brought to your room.  For patients admitted to the hospital, checkout time is 11:00 AM the day of discharge.(Restricted visitors-Any Persons displaying flu-like symptoms or illness).    Patients discharged the day of surgery will not be allowed to drive home. Must have responsible person with you x 24 hours once discharged.  Name and phone number of your driver:  Paralee Cancel [phone] Special Instructions: CHG(Chlorhedine 4%-"Hibiclens","Betasept","Aplicare") Shower Use Special Wash: see special instructions.(avoid face and genitals)   Please read over the following fact sheets that you were given: MRSA Information, Blood Transfusion fact sheet, Incentive Spirometry  Instruction.  Remember : Type/Screen "Blue armbands" - may not be removed once applied(would result in being retested AM of surgery, if removed).  Failure to follow these instructions may result in Cancellation of your surgery.   Patient signature_______________________________________________________

## 2014-02-28 NOTE — Pre-Procedure Instructions (Addendum)
12-29-13 EKG 3'15 -Epic. CXR done today. Pt. Aware of Coumadin / Lovenox use prior to surgery. Clearance notes with chart- Dr. Sherin Quarry, Dr. Denver Faster. 03-03-14 0830 labs viewable in Epic, note sent to Dr. Anne Fu office-will repeat PT/INR AM of.

## 2014-03-03 NOTE — Progress Notes (Signed)
03-03-14 Pt. Uses warfarin, being bridged with Lovenox- will repeat PT/INR AM of-note viewable labs in Epic.

## 2014-03-09 ENCOUNTER — Other Ambulatory Visit: Payer: Self-pay | Admitting: Orthopedic Surgery

## 2014-03-09 NOTE — H&P (Signed)
Joshua [name] DOB: 09-12-1941 Married / Language: Cleophus Molt / Race: White Male  Date of Admission:  03-10-2014  Chief Complaint:  Left Knee Pain  History of Present Illness The patient is a 73 year old male who comes in for a preoperative History and Physical. The patient is scheduled for a left total knee arthroplasty to be performed by Dr. Dione Plover. Aluisio, MD at [facility] on 03-10-2014. The patient is a 73 year old male presenting for a post-operative visit. The patient comes in a little over a year out from right total knee arthroplasty. The patient states that he is doing very well at this time. They are currently on no medication for their pain. The patient is currently doing home exercise program. The patient is being followed for their left knee pain and osteoarthritis. Symptoms reported today include: pain and instability. The patient feels that they are doing poorly. The following medication has been used for pain control: antiinflammatory medication (Ibuprofen). Note for "Follow-up Knee": Patient states that he cortisone injections help but are only temporary. The patient feels like his right knee is doing fantastic. He is very pleased with how he has done. He is not having any problems at all with the right knee. He is doing activities he desires. The left knee on the other hand is getting worse. He has documented advanced arthritis in that knee and he is having increasing pain. It is getting to be as bad as the right one was prior to when he had that fixed. he is now at a point where he would like to proceed with the knee replacement. The knee is affecting his activities and wants to get the knee fixed. They have been treated conservatively in the past for the above stated problem and despite conservative measures, they continue to have progressive pain and severe functional limitations and dysfunction. They have failed non-operative management including home  exercise, medications, and injections. It is felt that they would benefit from undergoing total joint replacement. Risks and benefits of the procedure have been discussed with the patient and they elect to proceed with surgery. There are no active contraindications to surgery such as ongoing infection or rapidly progressive neurological disease.   Problem List/Past Medical Osteoarthritis, knee (M17.9) Status post total knee replacement, right Sleep Apnea. uses CPAP Hypercholesterolemia High blood pressure Atrial Fibrillation. Chronic Measles Mumps   Allergies No Known Drug Allergies  Family History Hypertension. Father. father   Social History Illicit drug use. no Pain Contract. no Tobacco use. Current some day smoker. current some days smoker Current work status. working part time Alcohol use. current drinker; drinks hard liquor; only occasionally per week Drug/Alcohol Rehab (Currently). no Children. 1 Living situation. live with spouse Tobacco / smoke exposure. yes Marital status. married Drug/Alcohol Rehab (Previously). no Exercise. Exercises never Post-Surgical Plans. Plan is to go to [facility].   Medication History AmLODIPine Besylate (5MG  Tablet, Oral daily before breakfast) Active. Atenolol (100MG  Tablet, Oral two times daily) Active. Digoxin (250MCG Tablet, Oral daily before breakfast) Active. Lisinopril (20MG  Tablet, Oral two times daily) Active. Warfarin Sodium (10MG  Tablet, Oral) Active. (One tablet daily except Sundays when he takes one-half tablet)  Past Surgical History No pertinent past surgical history  Review of Systems General:Not Present- Chills, Fever, Fatigue, Weight Gain, Weight Loss and Memory Loss. Skin:Not Present- Hives, Itching, Rash, Eczema and Lesions. HEENT:Not Present- Tinnitus, Headache, Double Vision, Visual Loss, Hearing Loss and Dentures. Respiratory:Not Present- Shortness of breath at rest,  Allergies, Coughing up blood and Chronic Cough. Cardiovascular:Not Present- Chest Pain, Racing/skipping heartbeats, Difficulty Breathing Lying Down, Murmur, Swelling and Palpitations. Gastrointestinal:Not Present- Bloody Stool, Heartburn, Abdominal Pain, Vomiting, Nausea, Constipation, Diarrhea, Difficulty Swallowing, Jaundice and Loss of appetitie. Male Genitourinary:Present- Nocturia and Urinating at Night. Not Present- Urinary frequency, Blood in Urine, Weak urinary stream, Discharge, Flank Pain, Incontinence, Painful Urination, Urgency and Urinary Retention. Musculoskeletal:Present- Joint Pain. Not Present- Muscle Weakness, Muscle Pain, Joint Swelling, Back Pain, Morning Stiffness and Spasms. Neurological:Not Present- Tremor, Dizziness, Blackout spells, Paralysis, Difficulty with balance and Weakness. Psychiatric:Not Present- Insomnia.    Vitals Weight: 230 lb Height: 70 in Weight was reported by patient. Height was reported by patient. Body Surface Area: 2.27 m Body Mass Index: 33 kg/m Pulse: 76 (Regular) Resp.: 16 (Unlabored) BP: 142/82 (Sitting, Right Arm, Standard)     Physical Exam The physical exam findings are as follows:  Note: Patient is a 73 year old male with continued knee pain.   General Mental Status - Alert, cooperative and good historian. General Appearance- pleasant. Not in acute distress. Orientation- Oriented X3. Build & Nutrition- Well nourished and Well developed.   Head and Neck Head- normocephalic, atraumatic . Neck Global Assessment- supple. no bruit auscultated on the right and no bruit auscultated on the left.   Eye Vision- Wears corrective lenses. Pupil- Bilateral- Regular and Round. Motion- Bilateral- EOMI.   Chest and Lung Exam Auscultation: Breath sounds:- clear at anterior chest wall and - clear at posterior chest wall. Adventitious sounds:- No Adventitious  sounds.   Cardiovascular Auscultation:Rhythm- Irregular. Heart Sounds- S1 WNL and S2 WNL. Murmurs & Other Heart Sounds:Auscultation of the heart reveals - No Murmurs.   Abdomen Inspection:Contour- Generalized moderate distention. Palpation/Percussion:Tenderness- Abdomen is non-tender to palpation. Rigidity (guarding)- Abdomen is soft. Auscultation:Auscultation of the abdomen reveals - Bowel sounds normal.   Male Genitourinary  Not done, not pertinent to present illness  Musculoskeletal On exam he is alert and oriented in no apparent distress. His right knee looks great. There is no swelling at all. His range is about 0 to 130 with no tenderness or instability. The left knee shows no effusion. Varus deformity noted. Range 10 to 125. Marked crepitus on range of motion, tender medial greater than lateral with no instability noted.  RADIOGRAPHS: AP and lateral of both knees show the prosthesis on the right in excellent position with no periprosthetic abnormalities. On the left he has bone on bone arthritis medial and patellofemoral compartments.   Assessment & Plan Osteoarthritis, knee (M17.9) Impression: Left Knee  Status post total knee replacement, right  Note: Plan is for a Left Total Knee Replacement by Dr. Wynelle Link.  Plan is to go to [facility] for inpatient rehab.  PCP - Dr. Willey Blade - Patient has been seen preoperatively and felt to be stable for surgery. Dr. Carlyle Dolly - Patient has been seen preoperatively and felt to be stable for surgery. Dr. Lattie Haw recommended Coumadin Bridging with Lovenox Injections.  Started Lovenox 40MG /0.4ML, 1 (one) Solution daily AM sub-q injections for four days, 4 Syringe Injections first thing in the morning on Thrusday May 14th thru Sunday May 17th.  The patient does not have any contraindications and will receive TXA (tranexamic acid) prior to surgery.  Signed electronically by Joelene Millin, III  PA-C

## 2014-03-10 ENCOUNTER — Encounter (HOSPITAL_COMMUNITY): Payer: Medicare Other | Admitting: Anesthesiology

## 2014-03-10 ENCOUNTER — Encounter (HOSPITAL_COMMUNITY): Payer: Self-pay

## 2014-03-10 ENCOUNTER — Inpatient Hospital Stay (HOSPITAL_COMMUNITY)
Admission: RE | Admit: 2014-03-10 | Discharge: 2014-03-13 | DRG: 470 | Disposition: A | Discharge status: Skilled Nursing Facility | Payer: Medicare Other | Source: Ambulatory Visit | Attending: Orthopedic Surgery | Admitting: Orthopedic Surgery

## 2014-03-10 ENCOUNTER — Encounter (HOSPITAL_COMMUNITY)
Admission: RE | Disposition: A | Discharge status: Skilled Nursing Facility | Payer: Self-pay | Source: Ambulatory Visit | Attending: Orthopedic Surgery

## 2014-03-10 ENCOUNTER — Inpatient Hospital Stay (HOSPITAL_COMMUNITY): Payer: Medicare Other | Admitting: Anesthesiology

## 2014-03-10 DIAGNOSIS — R351 Nocturia: Secondary | ICD-10-CM | POA: Diagnosis present

## 2014-03-10 DIAGNOSIS — E785 Hyperlipidemia, unspecified: Secondary | ICD-10-CM

## 2014-03-10 DIAGNOSIS — G4730 Sleep apnea, unspecified: Secondary | ICD-10-CM | POA: Diagnosis present

## 2014-03-10 DIAGNOSIS — Z79899 Other long term (current) drug therapy: Secondary | ICD-10-CM

## 2014-03-10 DIAGNOSIS — M1990 Unspecified osteoarthritis, unspecified site: Secondary | ICD-10-CM

## 2014-03-10 DIAGNOSIS — N401 Enlarged prostate with lower urinary tract symptoms: Secondary | ICD-10-CM | POA: Diagnosis present

## 2014-03-10 DIAGNOSIS — M179 Osteoarthritis of knee, unspecified: Secondary | ICD-10-CM

## 2014-03-10 DIAGNOSIS — I10 Essential (primary) hypertension: Secondary | ICD-10-CM | POA: Diagnosis not present

## 2014-03-10 DIAGNOSIS — N138 Other obstructive and reflux uropathy: Secondary | ICD-10-CM | POA: Diagnosis present

## 2014-03-10 DIAGNOSIS — IMO0002 Reserved for concepts with insufficient information to code with codable children: Secondary | ICD-10-CM | POA: Diagnosis not present

## 2014-03-10 DIAGNOSIS — E7800 Pure hypercholesterolemia, unspecified: Secondary | ICD-10-CM | POA: Diagnosis present

## 2014-03-10 DIAGNOSIS — Z7901 Long term (current) use of anticoagulants: Secondary | ICD-10-CM | POA: Diagnosis not present

## 2014-03-10 DIAGNOSIS — M25569 Pain in unspecified knee: Secondary | ICD-10-CM | POA: Diagnosis not present

## 2014-03-10 DIAGNOSIS — Z8249 Family history of ischemic heart disease and other diseases of the circulatory system: Secondary | ICD-10-CM

## 2014-03-10 DIAGNOSIS — F17200 Nicotine dependence, unspecified, uncomplicated: Secondary | ICD-10-CM | POA: Diagnosis present

## 2014-03-10 DIAGNOSIS — R0602 Shortness of breath: Secondary | ICD-10-CM | POA: Diagnosis not present

## 2014-03-10 DIAGNOSIS — M1710 Unilateral primary osteoarthritis, unspecified knee: Secondary | ICD-10-CM | POA: Diagnosis not present

## 2014-03-10 DIAGNOSIS — I4891 Unspecified atrial fibrillation: Secondary | ICD-10-CM | POA: Diagnosis present

## 2014-03-10 DIAGNOSIS — Z96652 Presence of left artificial knee joint: Secondary | ICD-10-CM

## 2014-03-10 HISTORY — PX: TOTAL KNEE ARTHROPLASTY: SHX125

## 2014-03-10 LAB — PROTIME-INR
INR: 1.06 (ref 0.00–1.49)
PROTHROMBIN TIME: 13.6 s (ref 11.6–15.2)

## 2014-03-10 LAB — TYPE AND SCREEN
ABO/RH(D): O POS
Antibody Screen: NEGATIVE

## 2014-03-10 SURGERY — ARTHROPLASTY, KNEE, TOTAL
Anesthesia: Spinal | Site: Knee | Laterality: Left

## 2014-03-10 MED ORDER — BUPIVACAINE IN DEXTROSE 0.75-8.25 % IT SOLN
INTRATHECAL | Status: DC | PRN
  Administered 2014-03-10: 1.5 mL via INTRATHECAL

## 2014-03-10 MED ORDER — CHLORHEXIDINE GLUCONATE 4 % EX LIQD: 60.0000 mL | Freq: Once | CUTANEOUS | Status: DC

## 2014-03-10 MED ORDER — BISACODYL 10 MG RE SUPP: 10.0000 mg | Freq: Every day | RECTAL | Status: DC | PRN

## 2014-03-10 MED ORDER — WARFARIN - PHARMACIST DOSING INPATIENT: Freq: Every day | Status: DC

## 2014-03-10 MED ORDER — ONDANSETRON HCL 4 MG/2ML IJ SOLN
INTRAMUSCULAR | Status: AC
  Filled 2014-03-10: qty 2

## 2014-03-10 MED ORDER — METHOCARBAMOL 1000 MG/10ML IJ SOLN
500.0000 mg | Freq: Four times a day (QID) | INTRAVENOUS | Status: DC | PRN
  Administered 2014-03-10: 500 mg via INTRAVENOUS
  Filled 2014-03-10: qty 5

## 2014-03-10 MED ORDER — MORPHINE SULFATE 2 MG/ML IJ SOLN: 1.0000 mg | INTRAMUSCULAR | Status: DC | PRN

## 2014-03-10 MED ORDER — ONDANSETRON HCL 4 MG/2ML IJ SOLN: 4.0000 mg | Freq: Four times a day (QID) | INTRAMUSCULAR | Status: DC | PRN

## 2014-03-10 MED ORDER — METOCLOPRAMIDE HCL 5 MG/ML IJ SOLN: 5.0000 mg | Freq: Three times a day (TID) | INTRAMUSCULAR | Status: DC | PRN

## 2014-03-10 MED ORDER — KETOROLAC TROMETHAMINE 15 MG/ML IJ SOLN
INTRAMUSCULAR | Status: AC
  Filled 2014-03-10: qty 1

## 2014-03-10 MED ORDER — KETOROLAC TROMETHAMINE 15 MG/ML IJ SOLN
7.5000 mg | Freq: Four times a day (QID) | INTRAMUSCULAR | Status: AC | PRN
  Administered 2014-03-10: 7.5 mg via INTRAVENOUS

## 2014-03-10 MED ORDER — SODIUM CHLORIDE 0.9 % IJ SOLN
INTRAMUSCULAR | Status: AC
  Filled 2014-03-10: qty 10

## 2014-03-10 MED ORDER — TRANEXAMIC ACID 100 MG/ML IV SOLN
1000.0000 mg | INTRAVENOUS | Status: AC
  Administered 2014-03-10: 1000 mg via INTRAVENOUS
  Filled 2014-03-10: qty 10

## 2014-03-10 MED ORDER — TRAMADOL HCL 50 MG PO TABS: 50.0000 mg | ORAL_TABLET | Freq: Four times a day (QID) | ORAL | Status: DC | PRN

## 2014-03-10 MED ORDER — FENTANYL CITRATE 0.05 MG/ML IJ SOLN
INTRAMUSCULAR | Status: AC
  Filled 2014-03-10: qty 2

## 2014-03-10 MED ORDER — SODIUM CHLORIDE 0.9 % IJ SOLN
INTRAMUSCULAR | Status: DC | PRN
  Administered 2014-03-10: 30 mL

## 2014-03-10 MED ORDER — CEFAZOLIN SODIUM-DEXTROSE 2-3 GM-% IV SOLR
2.0000 g | INTRAVENOUS | Status: AC
  Administered 2014-03-10: 2 g via INTRAVENOUS

## 2014-03-10 MED ORDER — BUPIVACAINE LIPOSOME 1.3 % IJ SUSP
20.0000 mL | Freq: Once | INTRAMUSCULAR | Status: DC
  Filled 2014-03-10: qty 20

## 2014-03-10 MED ORDER — DEXAMETHASONE 6 MG PO TABS
10.0000 mg | ORAL_TABLET | Freq: Every day | ORAL | Status: AC
  Administered 2014-03-11: 10 mg via ORAL
  Filled 2014-03-10: qty 1

## 2014-03-10 MED ORDER — DEXAMETHASONE SODIUM PHOSPHATE 10 MG/ML IJ SOLN
INTRAMUSCULAR | Status: AC
  Filled 2014-03-10: qty 1

## 2014-03-10 MED ORDER — BUPIVACAINE HCL (PF) 0.25 % IJ SOLN
INTRAMUSCULAR | Status: AC
  Filled 2014-03-10: qty 30

## 2014-03-10 MED ORDER — MENTHOL 3 MG MT LOZG: 1.0000 | LOZENGE | OROMUCOSAL | Status: DC | PRN

## 2014-03-10 MED ORDER — METOCLOPRAMIDE HCL 10 MG PO TABS: 5.0000 mg | ORAL_TABLET | Freq: Three times a day (TID) | ORAL | Status: DC | PRN

## 2014-03-10 MED ORDER — ATENOLOL 100 MG PO TABS
100.0000 mg | ORAL_TABLET | Freq: Two times a day (BID) | ORAL | Status: DC
  Administered 2014-03-10 – 2014-03-13 (×6): 100 mg via ORAL
  Filled 2014-03-10 (×7): qty 1

## 2014-03-10 MED ORDER — ACETAMINOPHEN 650 MG RE SUPP: 650.0000 mg | Freq: Four times a day (QID) | RECTAL | Status: DC | PRN

## 2014-03-10 MED ORDER — SODIUM CHLORIDE 0.9 % IJ SOLN
INTRAMUSCULAR | Status: AC
  Filled 2014-03-10: qty 50

## 2014-03-10 MED ORDER — ACETAMINOPHEN 500 MG PO TABS
1000.0000 mg | ORAL_TABLET | Freq: Four times a day (QID) | ORAL | Status: AC
  Administered 2014-03-10 – 2014-03-11 (×4): 1000 mg via ORAL
  Filled 2014-03-10 (×4): qty 2

## 2014-03-10 MED ORDER — METHOCARBAMOL 500 MG PO TABS
500.0000 mg | ORAL_TABLET | Freq: Four times a day (QID) | ORAL | Status: DC | PRN
  Administered 2014-03-11 – 2014-03-12 (×3): 500 mg via ORAL
  Filled 2014-03-10 (×3): qty 1

## 2014-03-10 MED ORDER — AMLODIPINE BESYLATE 5 MG PO TABS
5.0000 mg | ORAL_TABLET | Freq: Every day | ORAL | Status: DC
  Administered 2014-03-11 – 2014-03-13 (×3): 5 mg via ORAL
  Filled 2014-03-10 (×5): qty 1

## 2014-03-10 MED ORDER — CEFAZOLIN SODIUM-DEXTROSE 2-3 GM-% IV SOLR
2.0000 g | Freq: Four times a day (QID) | INTRAVENOUS | Status: AC
  Administered 2014-03-10 (×2): 2 g via INTRAVENOUS
  Filled 2014-03-10 (×2): qty 50

## 2014-03-10 MED ORDER — ACETAMINOPHEN 325 MG PO TABS: 650.0000 mg | ORAL_TABLET | Freq: Four times a day (QID) | ORAL | Status: DC | PRN

## 2014-03-10 MED ORDER — EPHEDRINE SULFATE 50 MG/ML IJ SOLN
INTRAMUSCULAR | Status: AC
  Filled 2014-03-10: qty 1

## 2014-03-10 MED ORDER — LACTATED RINGERS IV SOLN
INTRAVENOUS | Status: DC | PRN
  Administered 2014-03-10 (×2): via INTRAVENOUS

## 2014-03-10 MED ORDER — DIGOXIN 250 MCG PO TABS
0.2500 mg | ORAL_TABLET | Freq: Every day | ORAL | Status: DC
  Administered 2014-03-11 – 2014-03-13 (×3): 0.25 mg via ORAL
  Filled 2014-03-10 (×5): qty 1

## 2014-03-10 MED ORDER — ONDANSETRON HCL 4 MG/2ML IJ SOLN
INTRAMUSCULAR | Status: DC | PRN
Start: 2014-03-10 — End: 2014-03-10
  Administered 2014-03-10: 4 mg via INTRAVENOUS

## 2014-03-10 MED ORDER — POLYETHYLENE GLYCOL 3350 17 G PO PACK: 17.0000 g | PACK | Freq: Every day | ORAL | Status: DC | PRN

## 2014-03-10 MED ORDER — PHENOL 1.4 % MT LIQD: 1.0000 | OROMUCOSAL | Status: DC | PRN

## 2014-03-10 MED ORDER — ACETAMINOPHEN 10 MG/ML IV SOLN
1000.0000 mg | Freq: Once | INTRAVENOUS | Status: AC
  Administered 2014-03-10: 1000 mg via INTRAVENOUS
  Filled 2014-03-10: qty 100

## 2014-03-10 MED ORDER — LACTATED RINGERS IV SOLN: INTRAVENOUS | Status: DC

## 2014-03-10 MED ORDER — FENTANYL CITRATE 0.05 MG/ML IJ SOLN
INTRAMUSCULAR | Status: DC | PRN
  Administered 2014-03-10 (×2): 50 ug via INTRAVENOUS

## 2014-03-10 MED ORDER — MEPERIDINE HCL 50 MG/ML IJ SOLN: 6.2500 mg | INTRAMUSCULAR | Status: DC | PRN

## 2014-03-10 MED ORDER — ONDANSETRON HCL 4 MG PO TABS: 4.0000 mg | ORAL_TABLET | Freq: Four times a day (QID) | ORAL | Status: DC | PRN

## 2014-03-10 MED ORDER — DIPHENHYDRAMINE HCL 12.5 MG/5ML PO ELIX
12.5000 mg | ORAL_SOLUTION | ORAL | Status: DC | PRN
  Administered 2014-03-12: 12.5 mg via ORAL
  Filled 2014-03-10: qty 5

## 2014-03-10 MED ORDER — PROPOFOL 10 MG/ML IV BOLUS
INTRAVENOUS | Status: AC
  Filled 2014-03-10: qty 20

## 2014-03-10 MED ORDER — PHENYLEPHRINE 40 MCG/ML (10ML) SYRINGE FOR IV PUSH (FOR BLOOD PRESSURE SUPPORT)
PREFILLED_SYRINGE | INTRAVENOUS | Status: AC
  Filled 2014-03-10: qty 10

## 2014-03-10 MED ORDER — DEXAMETHASONE SODIUM PHOSPHATE 10 MG/ML IJ SOLN
10.0000 mg | Freq: Once | INTRAMUSCULAR | Status: AC
  Administered 2014-03-10: 10 mg via INTRAVENOUS

## 2014-03-10 MED ORDER — PHENYLEPHRINE HCL 10 MG/ML IJ SOLN
INTRAMUSCULAR | Status: DC | PRN
  Administered 2014-03-10: 40 ug via INTRAVENOUS
  Administered 2014-03-10 (×2): 80 ug via INTRAVENOUS
  Administered 2014-03-10 (×2): 40 ug via INTRAVENOUS

## 2014-03-10 MED ORDER — DEXAMETHASONE SODIUM PHOSPHATE 10 MG/ML IJ SOLN
10.0000 mg | Freq: Every day | INTRAMUSCULAR | Status: AC
  Filled 2014-03-10: qty 1

## 2014-03-10 MED ORDER — SODIUM CHLORIDE 0.9 % IR SOLN
Status: DC | PRN
  Administered 2014-03-10: 1000 mL

## 2014-03-10 MED ORDER — BUPIVACAINE LIPOSOME 1.3 % IJ SUSP
INTRAMUSCULAR | Status: DC | PRN
  Administered 2014-03-10: 20 mL

## 2014-03-10 MED ORDER — FLEET ENEMA 7-19 GM/118ML RE ENEM: 1.0000 | ENEMA | Freq: Once | RECTAL | Status: AC | PRN

## 2014-03-10 MED ORDER — DEXTROSE-NACL 5-0.9 % IV SOLN
INTRAVENOUS | Status: DC
  Administered 2014-03-10: 11:00:00 via INTRAVENOUS

## 2014-03-10 MED ORDER — OXYCODONE HCL 5 MG PO TABS
5.0000 mg | ORAL_TABLET | ORAL | Status: DC | PRN
  Administered 2014-03-10: 5 mg via ORAL
  Administered 2014-03-11: 10 mg via ORAL
  Administered 2014-03-12: 5 mg via ORAL
  Filled 2014-03-10: qty 2
  Filled 2014-03-10: qty 1
  Filled 2014-03-10: qty 2

## 2014-03-10 MED ORDER — MIDAZOLAM HCL 5 MG/5ML IJ SOLN
INTRAMUSCULAR | Status: DC | PRN
  Administered 2014-03-10 (×2): 1 mg via INTRAVENOUS

## 2014-03-10 MED ORDER — SODIUM CHLORIDE 0.9 % IV SOLN
INTRAVENOUS | Status: DC
Start: 2014-03-10 — End: 2014-03-10

## 2014-03-10 MED ORDER — PROPOFOL INFUSION 10 MG/ML OPTIME
INTRAVENOUS | Status: DC | PRN
  Administered 2014-03-10: 140 ug/kg/min via INTRAVENOUS

## 2014-03-10 MED ORDER — FENTANYL CITRATE 0.05 MG/ML IJ SOLN: 25.0000 ug | INTRAMUSCULAR | Status: DC | PRN

## 2014-03-10 MED ORDER — BUPIVACAINE HCL 0.25 % IJ SOLN
INTRAMUSCULAR | Status: DC | PRN
  Administered 2014-03-10: 20 mL

## 2014-03-10 MED ORDER — WARFARIN SODIUM 10 MG PO TABS
10.0000 mg | ORAL_TABLET | Freq: Once | ORAL | Status: AC
  Administered 2014-03-10: 10 mg via ORAL
  Filled 2014-03-10: qty 1

## 2014-03-10 MED ORDER — PROMETHAZINE HCL 25 MG/ML IJ SOLN: 6.2500 mg | INTRAMUSCULAR | Status: DC | PRN

## 2014-03-10 MED ORDER — ENOXAPARIN SODIUM 30 MG/0.3ML ~~LOC~~ SOLN
30.0000 mg | Freq: Two times a day (BID) | SUBCUTANEOUS | Status: DC
  Administered 2014-03-11 – 2014-03-13 (×5): 30 mg via SUBCUTANEOUS
  Filled 2014-03-10 (×6): qty 0.3

## 2014-03-10 MED ORDER — 0.9 % SODIUM CHLORIDE (POUR BTL) OPTIME
TOPICAL | Status: DC | PRN
  Administered 2014-03-10: 1000 mL

## 2014-03-10 MED ORDER — MIDAZOLAM HCL 2 MG/2ML IJ SOLN
INTRAMUSCULAR | Status: AC
  Filled 2014-03-10: qty 2

## 2014-03-10 MED ORDER — DOCUSATE SODIUM 100 MG PO CAPS
100.0000 mg | ORAL_CAPSULE | Freq: Two times a day (BID) | ORAL | Status: DC
  Administered 2014-03-10 – 2014-03-13 (×7): 100 mg via ORAL

## 2014-03-10 MED ORDER — CEFAZOLIN SODIUM-DEXTROSE 2-3 GM-% IV SOLR
INTRAVENOUS | Status: AC
  Filled 2014-03-10: qty 50

## 2014-03-10 SURGICAL SUPPLY — 59 items
BAG SPEC THK2 15X12 ZIP CLS (MISCELLANEOUS)
BAG ZIPLOCK 12X15 (MISCELLANEOUS) ×1 IMPLANT
BANDAGE ELASTIC 6 VELCRO ST LF (GAUZE/BANDAGES/DRESSINGS) ×3 IMPLANT
BANDAGE ESMARK 6X9 LF (GAUZE/BANDAGES/DRESSINGS) ×1 IMPLANT
BLADE SAG 18X100X1.27 (BLADE) ×3 IMPLANT
BLADE SAW SGTL 11.0X1.19X90.0M (BLADE) ×3 IMPLANT
BNDG CMPR 9X6 STRL LF SNTH (GAUZE/BANDAGES/DRESSINGS) ×1
BNDG ESMARK 6X9 LF (GAUZE/BANDAGES/DRESSINGS) ×3
BOWL SMART MIX CTS (DISPOSABLE) ×3 IMPLANT
CAPT RP KNEE ×2 IMPLANT
CEMENT HV SMART SET (Cement) ×6 IMPLANT
CLOSURE WOUND 1/2 X4 (GAUZE/BANDAGES/DRESSINGS) ×1
CUFF TOURN SGL QUICK 34 (TOURNIQUET CUFF) ×3
CUFF TRNQT CYL 34X4X40X1 (TOURNIQUET CUFF) ×1 IMPLANT
DECANTER SPIKE VIAL GLASS SM (MISCELLANEOUS) ×3 IMPLANT
DRAPE EXTREMITY T 121X128X90 (DRAPE) ×3 IMPLANT
DRAPE POUCH INSTRU U-SHP 10X18 (DRAPES) ×3 IMPLANT
DRAPE U-SHAPE 47X51 STRL (DRAPES) ×3 IMPLANT
DRSG ADAPTIC 3X8 NADH LF (GAUZE/BANDAGES/DRESSINGS) ×3 IMPLANT
DRSG PAD ABDOMINAL 8X10 ST (GAUZE/BANDAGES/DRESSINGS) ×3 IMPLANT
DURAPREP 26ML APPLICATOR (WOUND CARE) ×3 IMPLANT
ELECT REM PT RETURN 9FT ADLT (ELECTROSURGICAL) ×3
ELECTRODE REM PT RTRN 9FT ADLT (ELECTROSURGICAL) ×1 IMPLANT
EVACUATOR 1/8 PVC DRAIN (DRAIN) ×3 IMPLANT
FACESHIELD WRAPAROUND (MASK) ×15 IMPLANT
FACESHIELD WRAPAROUND OR TEAM (MASK) ×5 IMPLANT
GLOVE BIO SURGEON STRL SZ8 (GLOVE) ×3 IMPLANT
GLOVE BIOGEL PI IND STRL 6.5 (GLOVE) IMPLANT
GLOVE BIOGEL PI IND STRL 8 (GLOVE) ×1 IMPLANT
GLOVE BIOGEL PI INDICATOR 6.5 (GLOVE) ×2
GLOVE BIOGEL PI INDICATOR 8 (GLOVE) ×2
GLOVE SURG SS PI 6.5 STRL IVOR (GLOVE) ×2 IMPLANT
GOWN STRL REUS W/TWL LRG LVL3 (GOWN DISPOSABLE) ×5 IMPLANT
GOWN STRL REUS W/TWL XL LVL3 (GOWN DISPOSABLE) ×6 IMPLANT
HANDPIECE INTERPULSE COAX TIP (DISPOSABLE) ×3
IMMOBILIZER KNEE 20 (SOFTGOODS) ×3 IMPLANT
KIT BASIN OR (CUSTOM PROCEDURE TRAY) ×3 IMPLANT
MANIFOLD NEPTUNE II (INSTRUMENTS) ×3 IMPLANT
NDL SAFETY ECLIPSE 18X1.5 (NEEDLE) ×2 IMPLANT
NEEDLE HYPO 18GX1.5 SHARP (NEEDLE) ×6
NS IRRIG 1000ML POUR BTL (IV SOLUTION) ×3 IMPLANT
PACK TOTAL JOINT (CUSTOM PROCEDURE TRAY) ×3 IMPLANT
PADDING CAST COTTON 6X4 STRL (CAST SUPPLIES) ×5 IMPLANT
POSITIONER SURGICAL ARM (MISCELLANEOUS) ×3 IMPLANT
SET HNDPC FAN SPRY TIP SCT (DISPOSABLE) ×1 IMPLANT
SPONGE GAUZE 4X4 12PLY (GAUZE/BANDAGES/DRESSINGS) ×3 IMPLANT
STRIP CLOSURE SKIN 1/2X4 (GAUZE/BANDAGES/DRESSINGS) ×2 IMPLANT
SUCTION FRAZIER 12FR DISP (SUCTIONS) ×3 IMPLANT
SUT MNCRL AB 4-0 PS2 18 (SUTURE) ×3 IMPLANT
SUT VIC AB 2-0 CT1 27 (SUTURE) ×12
SUT VIC AB 2-0 CT1 TAPERPNT 27 (SUTURE) ×4 IMPLANT
SUT VLOC 180 0 24IN GS25 (SUTURE) ×3 IMPLANT
SYR 20CC LL (SYRINGE) ×3 IMPLANT
SYR 50ML LL SCALE MARK (SYRINGE) ×3 IMPLANT
TOWEL OR 17X26 10 PK STRL BLUE (TOWEL DISPOSABLE) ×3 IMPLANT
TOWEL OR NON WOVEN STRL DISP B (DISPOSABLE) ×3 IMPLANT
TRAY FOLEY CATH 16FRSI W/METER (SET/KITS/TRAYS/PACK) ×2 IMPLANT
WATER STERILE IRR 1500ML POUR (IV SOLUTION) ×3 IMPLANT
WRAP KNEE MAXI GEL POST OP (GAUZE/BANDAGES/DRESSINGS) ×3 IMPLANT

## 2014-03-10 NOTE — Transfer of Care (Signed)
Immediate Anesthesia Transfer of Care Note  Patient: Joshua [name]  Procedure(s) Performed: Procedure(s): LEFT TOTAL KNEE ARTHROPLASTY (Left)  Patient Location: PACU  Anesthesia Type:Spinal  Level of Consciousness: awake, alert  and oriented  Airway & Oxygen Therapy: Patient Spontanous Breathing and Patient connected to nasal cannula oxygen  Post-op Assessment: Report given to PACU RN and Post -op Vital signs reviewed and stable  Post vital signs: Reviewed and stable  Complications: No apparent anesthesia complications

## 2014-03-10 NOTE — Progress Notes (Signed)
Clinical Social Work Department BRIEF PSYCHOSOCIAL ASSESSMENT 03/10/2014  Patient:  [facility]     Account Number:  [id number]     Admit date:  03/10/2014  Clinical Social Worker:  Lacie Scotts  Date/Time:  03/10/2014 12:00 M  Referred by:  Physician  Date Referred:  03/10/2014 Referred for  SNF Placement   Other Referral:   Interview type:  Patient Other interview type:    PSYCHOSOCIAL DATA Living Status:  WIFE Admitted from facility:   Level of care:   Primary support name:  Billie Primary support relationship to patient:  SPOUSE Degree of support available:   supportive    CURRENT CONCERNS Current Concerns  Post-Acute Placement   Other Concerns:    SOCIAL WORK ASSESSMENT / PLAN Pt is a 73 yr old gentleman living at home prior to hospitalization. CSW met with pt to assist with d/c planning. This is a planned admission. Pt has made prior arrangements to have ST Rehab at [place] following hospital d/c. CSW has contacted SNF and D/C plan has been confirmed. CSW will continue to follow to assist with d/c planning to SNF.   Assessment/plan status:  Psychosocial Support/Ongoing Assessment of Needs Other assessment/ plan:   Information/referral to community resources:   Insurance coverage for SNF and ambulance transport has been reviewed.    PATIENT'S/FAMILY'S RESPONSE TO PLAN OF CARE: Pt is happy surgery is over. Pain is being controlled.   He is motivated to begin therapy. Pt is looking forward to having rehab at the [facility].   Werner Lean LCSW [phone]

## 2014-03-10 NOTE — Op Note (Signed)
Pre-operative diagnosis- Osteoarthritis  Left knee(s)  Post-operative diagnosis- Osteoarthritis Left knee(s)  Procedure-  Left  Total Knee Arthroplasty  Surgeon- Dione Plover. Jeidi Gilles, MD  Assistant- Ardeen Jourdain, PA-C   Anesthesia-  Spinal  EBL-* No blood loss amount entered *   Drains Hemovac  Tourniquet time-  Total Tourniquet Time Documented: Thigh (Left) - 36 minutes Total: Thigh (Left) - 36 minutes     Complications- None  Condition-PACU - hemodynamically stable.   Brief Clinical Note   Joshua [name] is a 73 y.o. year old male with end stage OA of his left knee with progressively worsening pain and dysfunction. He has constant pain, with activity and at rest and significant functional deficits with difficulties even with ADLs. He has had extensive non-op management including analgesics, injections of cortisone and viscosupplements, and home exercise program, but remains in significant pain with significant dysfunction. Radiographs show bone on bone arthritis medial and patellofemoral. He presents now for left Total Knee Arthroplasty.     Procedure in detail---   The patient is brought into the operating room and positioned supine on the operating table. After successful administration of  Spinal,   a tourniquet is placed high on the  Left thigh(s) and the lower extremity is prepped and draped in the usual sterile fashion. Time out is performed by the operating team and then the  Left lower extremity is wrapped in Esmarch, knee flexed and the tourniquet inflated to 300 mmHg.       A midline incision is made with a ten blade through the subcutaneous tissue to the level of the extensor mechanism. A fresh blade is used to make a medial parapatellar arthrotomy. Soft tissue over the proximal medial tibia is subperiosteally elevated to the joint line with a knife and into the semimembranosus bursa with a Cobb elevator. Soft tissue over the proximal lateral tibia is elevated with  attention being paid to avoiding the patellar tendon on the tibial tubercle. The patella is everted, knee flexed 90 degrees and the ACL and PCL are removed. Findings are bone on bone medial and patellofemoral with large medial osteophytes.        The drill is used to create a starting hole in the distal femur and the canal is thoroughly irrigated with sterile saline to remove the fatty contents. The 5 degree Left  valgus alignment guide is placed into the femoral canal and the distal femoral cutting block is pinned to remove 10 mm off the distal femur. Resection is made with an oscillating saw.      The tibia is subluxed forward and the menisci are removed. The extramedullary alignment guide is placed referencing proximally at the medial aspect of the tibial tubercle and distally along the second metatarsal axis and tibial crest. The block is pinned to remove 15mm off the more deficient medial  side. Resection is made with an oscillating saw. Size 4is the most appropriate size for the tibia and the proximal tibia is prepared with the modular drill and keel punch for that size.      The femoral sizing guide is placed and size 4 is most appropriate. Rotation is marked off the epicondylar axis and confirmed by creating a rectangular flexion gap at 90 degrees. The size 4 cutting block is pinned in this rotation and the anterior, posterior and chamfer cuts are made with the oscillating saw. The intercondylar block is then placed and that cut is made.      Trial size 4  tibial component, trial size 4 posterior stabilized femur and a 12.5  mm posterior stabilized rotating platform insert trial is placed. Full extension is achieved with excellent varus/valgus and anterior/posterior balance throughout full range of motion. The patella is everted and thickness measured to be 27  mm. Free hand resection is taken to 15 mm, a 41 template is placed, lug holes are drilled, trial patella is placed, and it tracks normally.  Osteophytes are removed off the posterior femur with the trial in place. All trials are removed and the cut bone surfaces prepared with pulsatile lavage. Cement is mixed and once ready for implantation, the size 4 tibial implant, size  4 posterior stabilized femoral component, and the size 41 patella are cemented in place and the patella is held with the clamp. The trial insert is placed and the knee held in full extension. The Exparel (20 ml mixed with 30 ml saline) and .25% Bupivicaine, are injected into the extensor mechanism, posterior capsule, medial and lateral gutters and subcutaneous tissues.  All extruded cement is removed and once the cement is hard the permanent 12.5 mm posterior stabilized rotating platform insert is placed into the tibial tray.      The wound is copiously irrigated with saline solution and the extensor mechanism closed over a hemovac drain with #1 V-loc suture. The tourniquet is released for a total tourniquet time of 36  minutes. Flexion against gravity is 130 degrees and the patella tracks normally. Subcutaneous tissue is closed with 2.0 vicryl and subcuticular with running 4.0 Monocryl. The incision is cleaned and dried and steri-strips and a bulky sterile dressing are applied. The limb is placed into a knee immobilizer and the patient is awakened and transported to recovery in stable condition.      Please note that a surgical assistant was a medical necessity for this procedure in order to perform it in a safe and expeditious manner. Surgical assistant was necessary to retract the ligaments and vital neurovascular structures to prevent injury to them and also necessary for proper positioning of the limb to allow for anatomic placement of the prosthesis.   Dione Plover Zhane Donlan, MD    03/10/2014, 8:13 AM

## 2014-03-10 NOTE — Progress Notes (Signed)
ANTICOAGULATION CONSULT NOTE - Initial Consult  Pharmacy Consult for:  Warfarin Indication:  Atrial fibrillation and post-op VTE prophylaxis  No Known Allergies  Patient Measurements: 02/28/14 - Height 5'11"   Weight 105.9 kg  Vital Signs: Temp: 97.6 F (36.4 C) (05/18 0509) Temp src: Oral (05/18 0509) BP: 146/96 mmHg (05/18 0509) Pulse Rate: 101 (05/18 0509)  Labs:  Recent Labs  03/10/14 0540  LABPROT 13.6  INR 1.06    Medical History: Past Medical History  Diagnosis Date  . Hypertension   . Hyperlipidemia   . Degenerative joint disease     Right knee  . Chronic anticoagulation   . Sleep apnea     Rx-CPAP  . Atrial fibrillation 06/2010  . Elevated cholesterol   . Shortness of breath     on exertion  . Nocturia   . BPH (benign prostatic hyperplasia)     Medications:  Scheduled:  . bupivacaine liposome  20 mL Infiltration Once  . chlorhexidine  60 mL Topical Once  . [START ON 03/11/2014] enoxaparin (LOVENOX) injection  30 mg Subcutaneous Q12H    Assessment:  Asked to assist with Warfarin therapy for this 73 year-old male following left total knee arthroplasty today.  Mr. Hanauer is receiving chronic anticoagulation with Warfarin for atrial fibrillation.  His usual dose is 10 mg daily except for 5 mg on Sundays.  Today's INR is 1.06.  Lovenox 30 mg every 12 hours is scheduled to start on POD #1, and will be discontinued when the INR is > 2.   Goals of Therapy:   INR 2-3  Prevention of VTE   Plan:   Give warfarin 10 mg today.  Follow PT/INR daily  Review Coumadin education  [facility] R.Ph. 03/10/2014 9:11 AM

## 2014-03-10 NOTE — Progress Notes (Signed)
Clinical Social Work Department CLINICAL SOCIAL WORK PLACEMENT NOTE 03/10/2014  Patient:  Joshua [name], Joshua [name]  Account Number:  [id number] Admit date:  03/10/2014  Clinical Social Worker:  Werner Lean, LCSW  Date/time:  03/10/2014 02:00 PM  Clinical Social Work is seeking post-discharge placement for this patient at the following level of care:   SKILLED NURSING   (*CSW will update this form in Epic as items are completed)     Patient/family provided with [facility] Department of Clinical Social Work's list of facilities offering this level of care within the geographic area requested by the patient (or if unable, by the patient's family).  03/10/2014  Patient/family informed of their freedom to choose among providers that offer the needed level of care, that participate in Medicare, Medicaid or managed care program needed by the patient, have an available bed and are willing to accept the patient.  03/10/2014  Patient/family informed of [facility]' ownership interest in [facility], as well as of the fact that they are under no obligation to receive care at this facility.  PASARR submitted to EDS on 03/10/2014 PASARR number received from EDS on 03/10/2014  FL2 transmitted to all facilities in geographic area requested by pt/family on  03/10/2014 FL2 transmitted to all facilities within larger geographic area on   Patient informed that his/her managed care company has contracts with or will negotiate with  certain facilities, including the following:     Patient/family informed of bed offers received:  03/10/2014 Patient chooses bed at [facility] Physician recommends and patient chooses bed at    Patient to be transferred to  on   Patient to be transferred to facility by   The following physician request were entered in Epic:   Additional Comments:  Werner Lean LCSW [phone]

## 2014-03-10 NOTE — H&P (View-Only) (Signed)
Bernerd Pho DOB: 09-12-1941 Married / Language: Cleophus Molt / Race: White Male  Date of Admission:  03-10-2014  Chief Complaint:  Left Knee Pain  History of Present Illness The patient is a 73 year old male who comes in for a preoperative History and Physical. The patient is scheduled for a left total knee arthroplasty to be performed by Dr. Dione Plover. Aluisio, MD at [facility] on 03-10-2014. The patient is a 73 year old male presenting for a post-operative visit. The patient comes in a little over a year out from right total knee arthroplasty. The patient states that he is doing very well at this time. They are currently on no medication for their pain. The patient is currently doing home exercise program. The patient is being followed for their left knee pain and osteoarthritis. Symptoms reported today include: pain and instability. The patient feels that they are doing poorly. The following medication has been used for pain control: antiinflammatory medication (Ibuprofen). Note for "Follow-up Knee": Patient states that he cortisone injections help but are only temporary. The patient feels like his right knee is doing fantastic. He is very pleased with how he has done. He is not having any problems at all with the right knee. He is doing activities he desires. The left knee on the other hand is getting worse. He has documented advanced arthritis in that knee and he is having increasing pain. It is getting to be as bad as the right one was prior to when he had that fixed. he is now at a point where he would like to proceed with the knee replacement. The knee is affecting his activities and wants to get the knee fixed. They have been treated conservatively in the past for the above stated problem and despite conservative measures, they continue to have progressive pain and severe functional limitations and dysfunction. They have failed non-operative management including home  exercise, medications, and injections. It is felt that they would benefit from undergoing total joint replacement. Risks and benefits of the procedure have been discussed with the patient and they elect to proceed with surgery. There are no active contraindications to surgery such as ongoing infection or rapidly progressive neurological disease.   Problem List/Past Medical Osteoarthritis, knee (M17.9) Status post total knee replacement, right Sleep Apnea. uses CPAP Hypercholesterolemia High blood pressure Atrial Fibrillation. Chronic Measles Mumps   Allergies No Known Drug Allergies  Family History Hypertension. Father. father   Social History Illicit drug use. no Pain Contract. no Tobacco use. Current some day smoker. current some days smoker Current work status. working part time Alcohol use. current drinker; drinks hard liquor; only occasionally per week Drug/Alcohol Rehab (Currently). no Children. 1 Living situation. live with spouse Tobacco / smoke exposure. yes Marital status. married Drug/Alcohol Rehab (Previously). no Exercise. Exercises never Post-Surgical Plans. Plan is to go to [facility].   Medication History AmLODIPine Besylate (5MG  Tablet, Oral daily before breakfast) Active. Atenolol (100MG  Tablet, Oral two times daily) Active. Digoxin (250MCG Tablet, Oral daily before breakfast) Active. Lisinopril (20MG  Tablet, Oral two times daily) Active. Warfarin Sodium (10MG  Tablet, Oral) Active. (One tablet daily except Sundays when he takes one-half tablet)  Past Surgical History No pertinent past surgical history  Review of Systems General:Not Present- Chills, Fever, Fatigue, Weight Gain, Weight Loss and Memory Loss. Skin:Not Present- Hives, Itching, Rash, Eczema and Lesions. HEENT:Not Present- Tinnitus, Headache, Double Vision, Visual Loss, Hearing Loss and Dentures. Respiratory:Not Present- Shortness of breath at rest,  Allergies, Coughing up blood and Chronic Cough. Cardiovascular:Not Present- Chest Pain, Racing/skipping heartbeats, Difficulty Breathing Lying Down, Murmur, Swelling and Palpitations. Gastrointestinal:Not Present- Bloody Stool, Heartburn, Abdominal Pain, Vomiting, Nausea, Constipation, Diarrhea, Difficulty Swallowing, Jaundice and Loss of appetitie. Male Genitourinary:Present- Nocturia and Urinating at Night. Not Present- Urinary frequency, Blood in Urine, Weak urinary stream, Discharge, Flank Pain, Incontinence, Painful Urination, Urgency and Urinary Retention. Musculoskeletal:Present- Joint Pain. Not Present- Muscle Weakness, Muscle Pain, Joint Swelling, Back Pain, Morning Stiffness and Spasms. Neurological:Not Present- Tremor, Dizziness, Blackout spells, Paralysis, Difficulty with balance and Weakness. Psychiatric:Not Present- Insomnia.    Vitals Weight: 230 lb Height: 70 in Weight was reported by patient. Height was reported by patient. Body Surface Area: 2.27 m Body Mass Index: 33 kg/m Pulse: 76 (Regular) Resp.: 16 (Unlabored) BP: 142/82 (Sitting, Right Arm, Standard)     Physical Exam The physical exam findings are as follows:  Note: Patient is a 73 year old male with continued knee pain.   General Mental Status - Alert, cooperative and good historian. General Appearance- pleasant. Not in acute distress. Orientation- Oriented X3. Build & Nutrition- Well nourished and Well developed.   Head and Neck Head- normocephalic, atraumatic . Neck Global Assessment- supple. no bruit auscultated on the right and no bruit auscultated on the left.   Eye Vision- Wears corrective lenses. Pupil- Bilateral- Regular and Round. Motion- Bilateral- EOMI.   Chest and Lung Exam Auscultation: Breath sounds:- clear at anterior chest wall and - clear at posterior chest wall. Adventitious sounds:- No Adventitious  sounds.   Cardiovascular Auscultation:Rhythm- Irregular. Heart Sounds- S1 WNL and S2 WNL. Murmurs & Other Heart Sounds:Auscultation of the heart reveals - No Murmurs.   Abdomen Inspection:Contour- Generalized moderate distention. Palpation/Percussion:Tenderness- Abdomen is non-tender to palpation. Rigidity (guarding)- Abdomen is soft. Auscultation:Auscultation of the abdomen reveals - Bowel sounds normal.   Male Genitourinary  Not done, not pertinent to present illness  Musculoskeletal On exam he is alert and oriented in no apparent distress. His right knee looks great. There is no swelling at all. His range is about 0 to 130 with no tenderness or instability. The left knee shows no effusion. Varus deformity noted. Range 10 to 125. Marked crepitus on range of motion, tender medial greater than lateral with no instability noted.  RADIOGRAPHS: AP and lateral of both knees show the prosthesis on the right in excellent position with no periprosthetic abnormalities. On the left he has bone on bone arthritis medial and patellofemoral compartments.   Assessment & Plan Osteoarthritis, knee (M17.9) Impression: Left Knee  Status post total knee replacement, right  Note: Plan is for a Left Total Knee Replacement by Dr. Wynelle Link.  Plan is to go to [facility] for inpatient rehab.  PCP - Dr. Willey Blade - Patient has been seen preoperatively and felt to be stable for surgery. Dr. Carlyle Dolly - Patient has been seen preoperatively and felt to be stable for surgery. Dr. Lattie Haw recommended Coumadin Bridging with Lovenox Injections.  Started Lovenox 40MG /0.4ML, 1 (one) Solution daily AM sub-q injections for four days, 4 Syringe Injections first thing in the morning on Thrusday May 14th thru Sunday May 17th.  The patient does not have any contraindications and will receive TXA (tranexamic acid) prior to surgery.  Signed electronically by Joelene Millin, III  PA-C

## 2014-03-10 NOTE — Interval H&P Note (Signed)
History and Physical Interval Note:  03/10/2014 6:46 AM  Joshua [name]  has presented today for surgery, with the diagnosis of OSTEOARTHRITIS LEFT KNEE  The various methods of treatment have been discussed with the patient and family. After consideration of risks, benefits and other options for treatment, the patient has consented to  Procedure(s): LEFT TOTAL KNEE ARTHROPLASTY (Left) as a surgical intervention .  The patient's history has been reviewed, patient examined, no change in status, stable for surgery.  I have reviewed the patient's chart and labs.  Questions were answered to the patient's satisfaction.     Dione Plover Ordean Fouts

## 2014-03-10 NOTE — Anesthesia Preprocedure Evaluation (Addendum)
Anesthesia Evaluation  Patient identified by MRN, date of birth, ID band Patient awake    Reviewed: Allergy & Precautions, H&P , NPO status , Patient's Chart, lab work & pertinent test results  Airway Mallampati: II TM Distance: >3 FB Neck ROM: Full    Dental no notable dental hx. (+) Caps   Pulmonary sleep apnea and Continuous Positive Airway Pressure Ventilation , Current Smoker,  breath sounds clear to auscultation  Pulmonary exam normal       Cardiovascular hypertension, Pt. on medications + dysrhythmias Atrial Fibrillation Rhythm:Regular Rate:Normal     Neuro/Psych negative neurological ROS  negative psych ROS   GI/Hepatic negative GI ROS, Neg liver ROS,   Endo/Other  negative endocrine ROS  Renal/GU negative Renal ROS  negative genitourinary   Musculoskeletal negative musculoskeletal ROS (+)   Abdominal   Peds negative pediatric ROS (+)  Hematology negative hematology ROS (+)   Anesthesia Other Findings Upper front R cap  Reproductive/Obstetrics negative OB ROS                          Anesthesia Physical  Anesthesia Plan  ASA: III  Anesthesia Plan: Spinal   Post-op Pain Management:    Induction:   Airway Management Planned: Simple Face Mask  Additional Equipment:   Intra-op Plan:   Post-operative Plan:   Informed Consent: I have reviewed the patients History and Physical, chart, labs and discussed the procedure including the risks, benefits and alternatives for the proposed anesthesia with the patient or authorized representative who has indicated his/her understanding and acceptance.   Dental advisory given  Plan Discussed with: CRNA  Anesthesia Plan Comments:         Anesthesia Quick Evaluation

## 2014-03-10 NOTE — Anesthesia Procedure Notes (Signed)
Spinal  Patient location during procedure: OR Staffing Anesthesiologist: Uzma Hellmer Performed by: anesthesiologist  Preanesthetic Checklist Completed: patient identified, site marked, surgical consent, pre-op evaluation, timeout performed, IV checked, risks and benefits discussed and monitors and equipment checked Spinal Block Patient position: sitting Prep: Betadine Patient monitoring: heart rate, continuous pulse ox and blood pressure Approach: right paramedian Location: L3-4 Injection technique: single-shot Needle Needle type: Spinocan  Needle gauge: 22 G Needle length: 9 cm Additional Notes Expiration date of kit checked and confirmed. Patient tolerated procedure well, without complications.     

## 2014-03-10 NOTE — Evaluation (Signed)
Physical Therapy Evaluation Patient Details Name: VALENTINO SAAVEDRA MRN: [id number] DOB: 10-29-1940 Today's Date: 03/10/2014   History of Present Illness  s/p L TKA  Clinical Impression  Pt will benefit from PT to address deficits below; Plan is for rehab at [facility]    Follow Up Recommendations SNF    Equipment Recommendations  None recommended by PT    Recommendations for Other Services       Precautions / Restrictions Precautions Precautions: Knee Required Braces or Orthoses: Knee Immobilizer - Left Knee Immobilizer - Left: Discontinue once straight leg raise with < 10 degree lag      Mobility  Bed Mobility Overal bed mobility: Needs Assistance Bed Mobility: Supine to Sit     Supine to sit: Min guard     General bed mobility comments: cues for technique  Transfers Overall transfer level: Needs assistance Equipment used: Rolling walker (2 wheeled) Transfers: Sit to/from Stand Sit to Stand: Min assist;Min guard         General transfer comment: cues for hand placement and wt shift  Ambulation/Gait Ambulation/Gait assistance: Min guard Ambulation Distance (Feet): 50 Feet Assistive device: Rolling walker (2 wheeled) Gait Pattern/deviations: Step-to pattern;Antalgic     General Gait Details: cues for sequence and RW distance from self  Stairs            Wheelchair Mobility    Modified Rankin (Stroke Patients Only)       Balance                                             Pertinent Vitals/Pain Pain controlled    Home Living Family/patient expects to be discharged to:: Skilled nursing facility                      Prior Function                 Hand Dominance        Extremity/Trunk Assessment   Upper Extremity Assessment: Overall WFL for tasks assessed           Lower Extremity Assessment: LLE deficits/detail   LLE Deficits / Details: able to do SLR, KI used as precautionm this POD 0;  ankle WFL     Communication      Cognition Arousal/Alertness: Awake/alert Behavior During Therapy: WFL for tasks assessed/performed Overall Cognitive Status: Within Functional Limits for tasks assessed                      General Comments      Exercises Total Joint Exercises Ankle Circles/Pumps: AROM;Both;10 reps Quad Sets: 10 reps;AROM;Both      Assessment/Plan    PT Assessment Patient needs continued PT services  PT Diagnosis Difficulty walking   PT Problem List Decreased strength;Decreased range of motion;Decreased activity tolerance;Decreased balance;Decreased mobility;Decreased knowledge of use of DME;Decreased knowledge of precautions  PT Treatment Interventions DME instruction;Gait training;Functional mobility training;Therapeutic activities;Therapeutic exercise;Patient/family education   PT Goals (Current goals can be found in the Care Plan section) Acute Rehab PT Goals Patient Stated Goal: I after rehab PT Goal Formulation: With patient Time For Goal Achievement: 03/17/14 Potential to Achieve Goals: Good    Frequency 7X/week   Barriers to discharge        Co-evaluation  End of Session Equipment Utilized During Treatment: Gait belt Activity Tolerance: Patient tolerated treatment well Patient left: in bed;with call bell/phone within reach;with family/visitor present Nurse Communication: Mobility status         Time: 1430-1505 PT Time Calculation (min): 35 min   Charges:   PT Evaluation $Initial PT Evaluation Tier I: 1 Procedure PT Treatments $Gait Training: 23-37 mins   PT G Codes:          Neil Crouch 03/10/2014, 3:18 PM

## 2014-03-10 NOTE — Progress Notes (Signed)
Patient states took 4 days of Lovenox. Doesn't bow dosage. Last dose 03-09-14

## 2014-03-10 NOTE — Progress Notes (Signed)
Utilization review completed.  

## 2014-03-10 NOTE — Anesthesia Postprocedure Evaluation (Signed)
  Anesthesia Post-op Note  Patient: Joshua [name]  Procedure(s) Performed: Procedure(s) (LRB): LEFT TOTAL KNEE ARTHROPLASTY (Left)  Patient Location: PACU  Anesthesia Type: Spinal  Level of Consciousness: awake and alert   Airway and Oxygen Therapy: Patient Spontanous Breathing  Post-op Pain: mild  Post-op Assessment: Post-op Vital signs reviewed, Patient's Cardiovascular Status Stable, Respiratory Function Stable, Patent Airway and No signs of Nausea or vomiting  Last Vitals:  Filed Vitals:   03/10/14 1210  BP: 156/81  Pulse: 80  Temp: 36.8 C  Resp: 20    Post-op Vital Signs: stable   Complications: No apparent anesthesia complications

## 2014-03-11 LAB — PROTIME-INR
INR: 1.22 (ref 0.00–1.49)
Prothrombin Time: 15.1 seconds (ref 11.6–15.2)

## 2014-03-11 LAB — CBC
HCT: 37.7 % — ABNORMAL LOW (ref 39.0–52.0)
HEMOGLOBIN: 13.3 g/dL (ref 13.0–17.0)
MCH: 32.2 pg (ref 26.0–34.0)
MCHC: 35.3 g/dL (ref 30.0–36.0)
MCV: 91.3 fL (ref 78.0–100.0)
Platelets: 110 10*3/uL — ABNORMAL LOW (ref 150–400)
RBC: 4.13 MIL/uL — AB (ref 4.22–5.81)
RDW: 12.7 % (ref 11.5–15.5)
WBC: 13.2 10*3/uL — ABNORMAL HIGH (ref 4.0–10.5)

## 2014-03-11 LAB — BASIC METABOLIC PANEL
BUN: 14 mg/dL (ref 6–23)
CALCIUM: 8.8 mg/dL (ref 8.4–10.5)
CHLORIDE: 105 meq/L (ref 96–112)
CO2: 26 meq/L (ref 19–32)
CREATININE: 0.93 mg/dL (ref 0.50–1.35)
GFR calc Af Amer: >90 mL/min (ref >90)
GFR calc non Af Amer: 83 mL/min — ABNORMAL LOW (ref >90)
GLUCOSE: 167 mg/dL — AB (ref 70–99)
Potassium: 4.1 mEq/L (ref 3.7–5.3)
SODIUM: 140 meq/L (ref 137–147)

## 2014-03-11 MED ORDER — WARFARIN SODIUM 10 MG PO TABS
10.0000 mg | ORAL_TABLET | Freq: Once | ORAL | Status: AC
  Administered 2014-03-11: 10 mg via ORAL
  Filled 2014-03-11: qty 1

## 2014-03-11 NOTE — Progress Notes (Signed)
RT placed patient on CPAP on auto titrate ( min 5 and max 20) . Sterile water placed in water chamber for humidification. Patient tolerating well . RT to monitor as needed.

## 2014-03-11 NOTE — Progress Notes (Signed)
   Subjective: 1 Day Post-Op Procedure(s) (LRB): LEFT TOTAL KNEE ARTHROPLASTY (Left) Patient reports pain as mild.   Patient seen in rounds with Dr. Wynelle Link.  Eating well. Patient is well, but has had some minor complaints of pain in the knee, requiring pain medications We will start therapy today. Already doing SLR's. Plan is to go [facility] after hospital stay.  Objective: Vital signs in last 24 hours: Temp:  [97.5 F (36.4 C)-98.5 F (36.9 C)] 98.2 F (36.8 C) (05/19 0532) Pulse Rate:  [60-96] 93 (05/19 0532) Resp:  [15-24] 18 (05/19 0532) BP: (102-158)/(56-100) 140/80 mmHg (05/19 0532) SpO2:  [95 %-100 %] 97 % (05/19 0532) Weight:  [105.688 kg (233 lb)-105.915 kg (233 lb 8 oz)] 105.688 kg (233 lb) (05/18 1010)  Intake/Output from previous day:  Intake/Output Summary (Last 24 hours) at 03/11/14 0816 Last data filed at 03/11/14 0701  Gross per 24 hour  Intake 3598.33 ml  Output   3730 ml  Net -131.67 ml    Intake/Output this shift: Total I/O In: -  Out: 100 [Urine:100]  Labs:  Recent Labs  03/11/14 0424  HGB 13.3    Recent Labs  03/11/14 0424  WBC 13.2*  RBC 4.13*  HCT 37.7*  PLT 110*    Recent Labs  03/11/14 0424  NA 140  K 4.1  CL 105  CO2 26  BUN 14  CREATININE 0.93  GLUCOSE 167*  CALCIUM 8.8    Recent Labs  03/10/14 0540 03/11/14 0424  INR 1.06 1.22    EXAM General - Patient is Alert, Appropriate and Oriented Extremity - Neurovascular intact Sensation intact distally Dorsiflexion/Plantar flexion intact Dressing - dressing C/D/I Motor Function - intact, moving foot and toes well on exam. SLR's on exam Hemovac pulled without difficulty.  Past Medical History  Diagnosis Date  . Hypertension   . Hyperlipidemia   . Degenerative joint disease     Right knee  . Chronic anticoagulation   . Sleep apnea     Rx-CPAP  . Atrial fibrillation 06/2010  . Elevated cholesterol   . Shortness of breath     on exertion  . Nocturia   .  BPH (benign prostatic hyperplasia)     Assessment/Plan: 1 Day Post-Op Procedure(s) (LRB): LEFT TOTAL KNEE ARTHROPLASTY (Left) Active Problems:   OA (osteoarthritis) of knee  Estimated body mass index is 32.51 kg/(m^2) as calculated from the following:   Height as of this encounter: 5\' 11"  (1.803 m).   Weight as of this encounter: 105.688 kg (233 lb). Advance diet Up with therapy Discharge to [place], probably Thursday Continue therapy - walked 50 feet yesterday.  DVT Prophylaxis - Lovenox and Coumadin Continue Lovenox injections until the INR is therapeutic at or greater than 2.0.  When INR reaches the therapeutic level of equal to or greater than 2.0, the patient may discontinue the Lovenox injections.  Weight-Bearing as tolerated to left leg D/C O2 and Pulse OX and try on Room Air  Arlee Muslim, PA-C Orthopaedic Surgery 03/11/2014, 8:16 AM

## 2014-03-11 NOTE — Care Management Note (Addendum)
    Page 1 of 1   03/13/2014     2:27:24 PM CARE MANAGEMENT NOTE 03/13/2014  Patient:  [facility] A   Account Number:  [id number]  Date Initiated:  03/11/2014  Documentation initiated by:  [facility]  Subjective/Objective Assessment:   adm: L knee pain; L TOTAL KNEE ARTHROPLASTY     Action/Plan:   SNF for rehab   Anticipated DC Date:  03/13/2014   Anticipated DC Plan:  [place]  CM consult      Choice offered to / List presented to:          [facility] arranged  [place].   Status of service:  Completed, signed off Medicare Important Message given?  YES (If response is "NO", the following Medicare IM given date fields will be blank) Date Medicare IM given:  02/28/2014 Date Additional Medicare IM given:    Discharge Disposition:  [place]  Per UR Regulation:    If discussed at Long Length of Stay Meetings, dates discussed:    Comments:  03/13/14 14:10 CM received a call from [place] pt went to arranged SNF and refused to stay.  CM called pt for choice. Pt stated any agency will be fine.  Pt states he does not need any equipment bc he has a rolling walker at home and a raised toilet seat.  Referral made to [facility] rep, Cyril Mourning who has already made contact with pt for [facility].  RN has requested MD to place F2F for HHPT/OT.  CM called pt back to make sure he has no other Needs.  Pt states he is happy with [facility] and they have contacted him and will start therapy tomorrow. CSW aware of aforementioned.  No other CM needs were communicated.  Mariane Masters, BSN, Cm [phone].    03/11/14 14:45 CM reviewed; CSW arranging SNF for rehab. IM from medicare given pre-admission.  No other CM needs were communicated.  Mariane Masters, BSN, CM [phone].

## 2014-03-11 NOTE — Progress Notes (Signed)
OT Cancellation Note  Patient Details Name: Joshua [name] MRN: [id number] DOB: 09/24/1941   Cancelled Treatment:    Reason Eval/Treat Not Completed: Other (comment). Note plan is for SNF. Will defer OT eval to SNF.   Alycia Patten Linder Prajapati [phone] 03/11/2014, 11:17 AM

## 2014-03-11 NOTE — Progress Notes (Signed)
Physical Therapy Treatment Patient Details Name: Joshua [name] MRN: [id number] DOB: May 10, 1941 Today's Date: 03/11/2014    History of Present Illness s/p L TKA    PT Comments    POD # 1 pm session.  Amb in hallway then to BR.  Static standing at sink to brush teeth several min.  Assisted back to bed for CPM.  Follow Up Recommendations  SNF [facility])     Equipment Recommendations       Recommendations for Other Services       Precautions / Restrictions Precautions Precautions: Knee Precaution Comments: Instructed pt on KI use for amb  Required Braces or Orthoses: Knee Immobilizer - Left Knee Immobilizer - Left: Discontinue once straight leg raise with < 10 degree lag Restrictions Weight Bearing Restrictions: No    Mobility  Bed Mobility Overal bed mobility: Needs Assistance Bed Mobility: Sit to Supine     Supine to sit: Min guard     General bed mobility comments: cues for technique  Transfers Overall transfer level: Needs assistance Equipment used: Rolling walker (2 wheeled) Transfers: Sit to/from Stand Sit to Stand: Min assist;Min guard         General transfer comment: cues for hand placement and wt shift  Ambulation/Gait Ambulation/Gait assistance: Min guard Ambulation Distance (Feet): 65 Feet Assistive device: Rolling walker (2 wheeled) Gait Pattern/deviations: Step-to pattern Gait velocity: decreased   General Gait Details: cues for sequence and RW distance from self   Stairs            Wheelchair Mobility    Modified Rankin (Stroke Patients Only)       Balance                                    Cognition                            Exercises      General Comments        Pertinent Vitals/Pain C/o fatigue     Home Living                      Prior Function            PT Goals (current goals can now be found in the care plan section) Progress towards PT goals: Progressing  toward goals    Frequency  7X/week    PT Plan      Co-evaluation             End of Session Equipment Utilized During Treatment: Gait belt Activity Tolerance: Patient tolerated treatment well Patient left: in bed;with call bell/phone within reach     Time: 1405-1500 PT Time Calculation (min): 55 min  Charges:  $Gait Training: 8-22 mins $Therapeutic Exercise: 8-22 mins $Therapeutic Activity: 8-22 mins                    G Codes:      Rica Koyanagi  PTA [facility]  Acute  Rehab Pager      [phone]

## 2014-03-11 NOTE — Progress Notes (Signed)
ANTICOAGULATION CONSULT NOTE - Initial Consult  Pharmacy Consult for:  Warfarin Indication:  Atrial fibrillation and post-op VTE prophylaxis  No Known Allergies  Patient Measurements: 02/28/14 - Height 5'11"   Weight 105.9 kg  Vital Signs: Temp: 98.2 F (36.8 C) (05/19 0532) Temp src: Oral (05/19 0532) BP: 140/80 mmHg (05/19 0532) Pulse Rate: 93 (05/19 0532)  Labs:  Recent Labs  03/10/14 0540 03/11/14 0424  HGB  --  13.3  HCT  --  37.7*  PLT  --  110*  LABPROT 13.6 15.1  INR 1.06 1.22  CREATININE  --  0.93    Medical History: Past Medical History  Diagnosis Date  . Hypertension   . Hyperlipidemia   . Degenerative joint disease     Right knee  . Chronic anticoagulation   . Sleep apnea     Rx-CPAP  . Atrial fibrillation 06/2010  . Elevated cholesterol   . Shortness of breath     on exertion  . Nocturia   . BPH (benign prostatic hyperplasia)     Medications:  Scheduled:  . amLODipine  5 mg Oral QAC breakfast  . atenolol  100 mg Oral BID  . dexamethasone  10 mg Oral Daily   Or  . dexamethasone  10 mg Intravenous Daily  . digoxin  0.25 mg Oral QAC breakfast  . docusate sodium  100 mg Oral BID  . enoxaparin (LOVENOX) injection  30 mg Subcutaneous Q12H  . warfarin  10 mg Oral ONCE-1800  . Warfarin - Pharmacist Dosing Inpatient   Does not apply q1800    Assessment:  Asked to assist with Warfarin therapy for this 73 year-old male following left total knee arthroplasty today.  Mr. Jilek is receiving chronic anticoagulation with Warfarin for atrial fibrillation.  His usual dose is 10 mg daily except for 5 mg on Sundays.  Today's INR is 1.22  Lovenox 30 mg every 12 hours is scheduled to start on POD #1, and will be discontinued when the INR is > 2.   Goals of Therapy:   INR 2-3  Prevention of VTE   Plan:   Give warfarin 10 mg x 1 today.  Follow PT/INR daily  Kizzie Furnish, PharmD Pager: [phone] 03/11/2014 8:14 AM

## 2014-03-11 NOTE — Progress Notes (Signed)
Physical Therapy Treatment Patient Details Name: Joshua [name] MRN: [id number] DOB: 1941/08/10 Today's Date: 03/11/2014    History of Present Illness s/p L TKA    PT Comments    POD # 1 am session.  Applied KI and instructed pt on use for amb.  Assisted OOB to amb in hallway.  Assisted to recliner then performed TKR TE's followed by ICE.   Follow Up Recommendations  SNF [facility])     Equipment Recommendations       Recommendations for Other Services       Precautions / Restrictions Precautions Precautions: Knee Precaution Comments: Instructed pt on KI use for amb  Required Braces or Orthoses: Knee Immobilizer - Left Knee Immobilizer - Left: Discontinue once straight leg raise with < 10 degree lag Restrictions Weight Bearing Restrictions: No    Mobility  Bed Mobility Overal bed mobility: Needs Assistance Bed Mobility: Supine to Sit     Supine to sit: Min guard     General bed mobility comments: cues for technique  Transfers   Equipment used: Rolling walker (2 wheeled) Transfers: Sit to/from Stand Sit to Stand: Min assist;Min guard         General transfer comment: cues for hand placement and wt shift  Ambulation/Gait Ambulation/Gait assistance: Min guard Ambulation Distance (Feet): 55 Feet Assistive device: Rolling walker (2 wheeled) Gait Pattern/deviations: Step-to pattern Gait velocity: decreased   General Gait Details: cues for sequence and RW distance from self   Stairs            Wheelchair Mobility    Modified Rankin (Stroke Patients Only)       Balance                                    Cognition                            Exercises   Total Knee Replacement TE's 10 reps B LE ankle pumps 10 reps towel squeezes 10 reps knee presses 10 reps heel slides  10 reps SAQ's 10 reps SLR's 10 reps ABD Followed by ICE     General Comments        Pertinent Vitals/Pain C/o 3/10 Pre  medicated ICE     Home Living                      Prior Function            PT Goals (current goals can now be found in the care plan section) Progress towards PT goals: Progressing toward goals    Frequency  7X/week    PT Plan      Co-evaluation             End of Session Equipment Utilized During Treatment: Gait belt Activity Tolerance: Patient tolerated treatment well Patient left: in chair;with call bell/phone within reach     Time: 0932-0959 PT Time Calculation (min): 27 min  Charges:  $Gait Training: 8-22 mins $Therapeutic Exercise: 8-22 mins                    G Codes:      Rica Koyanagi  PTA [facility]  Acute  Rehab Pager      [phone]

## 2014-03-11 NOTE — Progress Notes (Signed)
Pt placed himself on CPAP. CPAP is on auto titrate (min-5 & max-20) and pt is on room air. Sterile water was added by the pt for humidification. Pt looks comfortable and is tolerating CPAP well at this time. RT will continue to monitor as needed.

## 2014-03-12 LAB — CBC
HEMATOCRIT: 38.3 % — AB (ref 39.0–52.0)
HEMOGLOBIN: 13.1 g/dL (ref 13.0–17.0)
MCH: 31.8 pg (ref 26.0–34.0)
MCHC: 34.2 g/dL (ref 30.0–36.0)
MCV: 93 fL (ref 78.0–100.0)
Platelets: 117 10*3/uL — ABNORMAL LOW (ref 150–400)
RBC: 4.12 MIL/uL — ABNORMAL LOW (ref 4.22–5.81)
RDW: 13 % (ref 11.5–15.5)
WBC: 14 10*3/uL — ABNORMAL HIGH (ref 4.0–10.5)

## 2014-03-12 LAB — BASIC METABOLIC PANEL
BUN: 15 mg/dL (ref 6–23)
CO2: 24 mEq/L (ref 19–32)
Calcium: 9 mg/dL (ref 8.4–10.5)
Chloride: 106 mEq/L (ref 96–112)
Creatinine, Ser: 0.83 mg/dL (ref 0.50–1.35)
GFR calc Af Amer: >90 mL/min (ref >90)
GFR, EST NON AFRICAN AMERICAN: 86 mL/min — AB (ref >90)
Glucose, Bld: 134 mg/dL — ABNORMAL HIGH (ref 70–99)
Potassium: 4.3 mEq/L (ref 3.7–5.3)
Sodium: 141 mEq/L (ref 137–147)

## 2014-03-12 LAB — PROTIME-INR
INR: 1.3 (ref 0.00–1.49)
Prothrombin Time: 15.9 seconds — ABNORMAL HIGH (ref 11.6–15.2)

## 2014-03-12 MED ORDER — WARFARIN SODIUM 2.5 MG PO TABS
12.5000 mg | ORAL_TABLET | Freq: Once | ORAL | Status: AC
  Administered 2014-03-12: 12.5 mg via ORAL
  Filled 2014-03-12: qty 1

## 2014-03-12 NOTE — Progress Notes (Signed)
   Subjective: 2 Days Post-Op Procedure(s) (LRB): LEFT TOTAL KNEE ARTHROPLASTY (Left) Patient reports pain as mild.   Patient seen in rounds with Dr. Wynelle Link.  Sitting up in chair. Patient is well, but has had some minor complaints of pain in the knee, requiring pain medications Plan is to go [facility] after hospital stay.  Objective: Vital signs in last 24 hours: Temp:  [98.1 F (36.7 C)-98.3 F (36.8 C)] 98.2 F (36.8 C) (05/20 0531) Pulse Rate:  [82-95] 82 (05/20 0531) Resp:  [18-20] 18 (05/20 0531) BP: (152-155)/(82-87) 155/82 mmHg (05/20 0531) SpO2:  [93 %-99 %] 99 % (05/20 0531)  Intake/Output from previous day:  Intake/Output Summary (Last 24 hours) at 03/12/14 1312 Last data filed at 03/12/14 0930  Gross per 24 hour  Intake    640 ml  Output   1825 ml  Net  -1185 ml    Intake/Output this shift: Total I/O In: 240 [P.O.:240] Out: -   Labs:  Recent Labs  03/11/14 0424 03/12/14 0432  HGB 13.3 13.1    Recent Labs  03/11/14 0424 03/12/14 0432  WBC 13.2* 14.0*  RBC 4.13* 4.12*  HCT 37.7* 38.3*  PLT 110* 117*    Recent Labs  03/11/14 0424 03/12/14 0432  NA 140 141  K 4.1 4.3  CL 105 106  CO2 26 24  BUN 14 15  CREATININE 0.93 0.83  GLUCOSE 167* 134*  CALCIUM 8.8 9.0    Recent Labs  03/11/14 0424 03/12/14 0432  INR 1.22 1.30    EXAM General - Patient is Alert, Appropriate and Oriented Extremity - Neurovascular intact Sensation intact distally Dorsiflexion/Plantar flexion intact Dressing/Incision - clean, dry, no drainage Motor Function - intact, moving foot and toes well on exam.   Past Medical History  Diagnosis Date  . Hypertension   . Hyperlipidemia   . Degenerative joint disease     Right knee  . Chronic anticoagulation   . Sleep apnea     Rx-CPAP  . Atrial fibrillation 06/2010  . Elevated cholesterol   . Shortness of breath     on exertion  . Nocturia   . BPH (benign prostatic hyperplasia)     Assessment/Plan: 2  Days Post-Op Procedure(s) (LRB): LEFT TOTAL KNEE ARTHROPLASTY (Left) Active Problems:   OA (osteoarthritis) of knee  Estimated body mass index is 32.51 kg/(m^2) as calculated from the following:   Height as of this encounter: 5\' 11"  (1.803 m).   Weight as of this encounter: 105.688 kg (233 lb). Up with therapy Plan for discharge tomorrow Discharge to SNF  DVT Prophylaxis - Lovenox and Coumadin INR is 1.30 today Continue Lovenox injections until the INR is therapeutic at or greater than 2.0.  When INR reaches the therapeutic level of equal to or greater than 2.0, the patient may discontinue the Lovenox injections.  Weight-Bearing as tolerated to left leg  Arlee Muslim, PA-C Orthopaedic Surgery 03/12/2014, 1:12 PM    \

## 2014-03-12 NOTE — Progress Notes (Signed)
Physical Therapy Treatment Patient Details Name: Joshua [name] MRN: [id number] DOB: June 20, 1941 Today's Date: 03/24/14    History of Present Illness s/p L TKA    PT Comments    Progressing well  Follow Up Recommendations  SNF     Equipment Recommendations  None recommended by PT    Recommendations for Other Services       Precautions / Restrictions Precautions Precautions: Knee Required Braces or Orthoses: Knee Immobilizer - Left Knee Immobilizer - Left: Discontinue once straight leg raise with < 10 degree lag (Pt performed IND SLR this pm) Restrictions Weight Bearing Restrictions: No Other Position/Activity Restrictions: WBAT    Mobility  Bed Mobility Overal bed mobility: Needs Assistance Bed Mobility: Sit to Supine       Sit to supine: Min assist   General bed mobility comments: cues for sequence and use of R LE to self assist  Transfers Overall transfer level: Needs assistance Equipment used: Rolling walker (2 wheeled) Transfers: Sit to/from Stand Sit to Stand: Min guard;Min assist         General transfer comment: cues for hand placement and wt shift  Ambulation/Gait Ambulation/Gait assistance: Min guard Ambulation Distance (Feet): 80 Feet (twice) Assistive device: Rolling walker (2 wheeled) Gait Pattern/deviations: Step-to pattern;Step-through pattern;Decreased step length - right;Decreased step length - left;Shuffle;Trunk flexed     General Gait Details: cues for sequence, posture and position from [employer]            Wheelchair Mobility    Modified Rankin (Stroke Patients Only)       Balance                                    Cognition Arousal/Alertness: Awake/alert Behavior During Therapy: WFL for tasks assessed/performed Overall Cognitive Status: Within Functional Limits for tasks assessed                      Exercises      General Comments        Pertinent Vitals/Pain 2/10    Home  Living                      Prior Function            PT Goals (current goals can now be found in the care plan section) Acute Rehab PT Goals Patient Stated Goal: Ind after rehab PT Goal Formulation: With patient Time For Goal Achievement: 03/17/14 Potential to Achieve Goals: Good Progress towards PT goals: Progressing toward goals    Frequency  7X/week    PT Plan Current plan remains appropriate    Co-evaluation             End of Session Equipment Utilized During Treatment: Gait belt Activity Tolerance: Patient tolerated treatment well Patient left: in bed;with call bell/phone within reach     Time: 4098-1191 PT Time Calculation (min): 16 min  Charges:  $Gait Training: 8-22 mins                    G Codes:      [name] 03/24/14, 5:08 PM

## 2014-03-12 NOTE — Progress Notes (Addendum)
Physical Therapy Treatment Patient Details Name: Joshua [name] MRN: [id number] DOB: [birth year] Today's Date: 03/12/2014    History of Present Illness s/p L TKA    PT Comments    POD # 2 am session.  Applied KI and assisted pt OOB to amb in hallway then perform TKR TE's followed by ICE.  Follow Up Recommendations  SNF [facility])     Equipment Recommendations  None recommended by PT    Recommendations for Other Services       Precautions / Restrictions Precautions Precautions: Knee Precaution Comments: Instructed pt on KI use for amb  Required Braces or Orthoses: Knee Immobilizer - Left Knee Immobilizer - Left: Discontinue once straight leg raise with < 10 degree lag Restrictions Weight Bearing Restrictions: No    Mobility  Bed Mobility Overal bed mobility: Needs Assistance Bed Mobility: Supine to Sit     Supine to sit: Min assist     General bed mobility comments: min assist R LE off bed and increased time  Transfers Overall transfer level: Needs assistance Equipment used: Rolling walker (2 wheeled) Transfers: Sit to/from Stand Sit to Stand: Min guard;Min assist         General transfer comment: cues for hand placement and wt shift  Ambulation/Gait Ambulation/Gait assistance: Min guard Ambulation Distance (Feet): 78 Feet Assistive device: Rolling walker (2 wheeled) Gait Pattern/deviations: Step-to pattern Gait velocity: decreased   General Gait Details: cues for sequence and RW distance from self   Stairs            Wheelchair Mobility    Modified Rankin (Stroke Patients Only)       Balance                                    Cognition                            Exercises   Total Knee Replacement TE's 10 reps B LE ankle pumps 10 reps towel squeezes 10 reps knee presses 10 reps heel slides  10 reps SAQ's 10 reps SLR's 10 reps ABD Followed by ICE    General Comments        Pertinent  Vitals/Pain C/o 3/10 ICE applied Pre medicated    Home Living                      Prior Function            PT Goals (current goals can now be found in the care plan section) Progress towards PT goals: Progressing toward goals    Frequency  7X/week    PT Plan      Co-evaluation             End of Session Equipment Utilized During Treatment: Gait belt Activity Tolerance: Patient tolerated treatment well Patient left: in chair;with call bell/phone within reach     Time: 1100-1128 PT Time Calculation (min): 28 min  Charges:  $Gait Training: 8-22 mins $Therapeutic Exercise: 8-22 mins                    G Codes:      Rica Koyanagi  PTA [facility]  Acute  Rehab Pager      [phone]

## 2014-03-12 NOTE — Progress Notes (Signed)
ANTICOAGULATION CONSULT NOTE - Initial Consult  Pharmacy Consult for:  Warfarin Indication:  Atrial fibrillation and post-op VTE prophylaxis  No Known Allergies  Patient Measurements: 02/28/14 - Height 5'11"   Weight 105.9 kg  Vital Signs: Temp: 98.2 F (36.8 C) (05/20 0531) Temp src: Oral (05/20 0531) BP: 155/82 mmHg (05/20 0531) Pulse Rate: 82 (05/20 0531)  Labs:  Recent Labs  03/10/14 0540 03/11/14 0424 03/12/14 0432  HGB  --  13.3 13.1  HCT  --  37.7* 38.3*  PLT  --  110* 117*  LABPROT 13.6 15.1 15.9*  INR 1.06 1.22 1.30  CREATININE  --  0.93 0.83    Medical History: Past Medical History  Diagnosis Date  . Hypertension   . Hyperlipidemia   . Degenerative joint disease     Right knee  . Chronic anticoagulation   . Sleep apnea     Rx-CPAP  . Atrial fibrillation 06/2010  . Elevated cholesterol   . Shortness of breath     on exertion  . Nocturia   . BPH (benign prostatic hyperplasia)     Medications:  Scheduled:  . amLODipine  5 mg Oral QAC breakfast  . atenolol  100 mg Oral BID  . digoxin  0.25 mg Oral QAC breakfast  . docusate sodium  100 mg Oral BID  . enoxaparin (LOVENOX) injection  30 mg Subcutaneous Q12H  . warfarin  12.5 mg Oral ONCE-1800  . Warfarin - Pharmacist Dosing Inpatient   Does not apply q1800    Assessment:  Asked to assist with Warfarin therapy for this 73 year-old male following left total knee arthroplasty today.  Mr. Wyka is receiving chronic anticoagulation with Warfarin for atrial fibrillation.  His usual dose is 10 mg daily except for 5 mg on Sundays.  Today's INR is 1.3  Lovenox 30 mg every 12 hours is scheduled to start on POD #1, and will be discontinued when the INR is > 2.   Goals of Therapy:   INR 2-3  Prevention of VTE   Plan:   Give warfarin 12.5 mg x 1 today.  Follow PT/INR daily  Kizzie Furnish, PharmD Pager: [phone] 03/12/2014 10:02 AM

## 2014-03-13 LAB — CBC
HCT: 40.7 % (ref 39.0–52.0)
HEMOGLOBIN: 13.5 g/dL (ref 13.0–17.0)
MCH: 31.3 pg (ref 26.0–34.0)
MCHC: 33.2 g/dL (ref 30.0–36.0)
MCV: 94.4 fL (ref 78.0–100.0)
Platelets: 116 10*3/uL — ABNORMAL LOW (ref 150–400)
RBC: 4.31 MIL/uL (ref 4.22–5.81)
RDW: 13.1 % (ref 11.5–15.5)
WBC: 9.5 10*3/uL (ref 4.0–10.5)

## 2014-03-13 LAB — PROTIME-INR
INR: 1.42 (ref 0.00–1.49)
Prothrombin Time: 17 seconds — ABNORMAL HIGH (ref 11.6–15.2)

## 2014-03-13 MED ORDER — TRAMADOL HCL 50 MG PO TABS: 50.0000 mg | ORAL_TABLET | Freq: Four times a day (QID) | ORAL | Status: DC | PRN

## 2014-03-13 MED ORDER — POLYETHYLENE GLYCOL 3350 17 G PO PACK: 17.0000 g | PACK | Freq: Every day | ORAL | Status: DC | PRN

## 2014-03-13 MED ORDER — OXYCODONE HCL 5 MG PO TABS: 5.0000 mg | ORAL_TABLET | ORAL | Status: DC | PRN

## 2014-03-13 MED ORDER — METHOCARBAMOL 500 MG PO TABS: 500.0000 mg | ORAL_TABLET | Freq: Four times a day (QID) | ORAL | Status: DC | PRN

## 2014-03-13 MED ORDER — WARFARIN SODIUM 10 MG PO TABS
10.0000 mg | ORAL_TABLET | Freq: Once | ORAL | Status: DC
Start: 2014-03-13 — End: 2014-03-13
  Filled 2014-03-13: qty 1

## 2014-03-13 MED ORDER — METOCLOPRAMIDE HCL 5 MG PO TABS: 5.0000 mg | ORAL_TABLET | Freq: Three times a day (TID) | ORAL | Status: DC | PRN

## 2014-03-13 MED ORDER — DSS 100 MG PO CAPS: 100.0000 mg | ORAL_CAPSULE | Freq: Two times a day (BID) | ORAL | Status: DC

## 2014-03-13 MED ORDER — WARFARIN SODIUM 10 MG PO TABS: 5.0000 mg | ORAL_TABLET | Freq: Every day | ORAL | Status: DC

## 2014-03-13 MED ORDER — ONDANSETRON HCL 4 MG PO TABS: 4.0000 mg | ORAL_TABLET | Freq: Four times a day (QID) | ORAL | Status: DC | PRN

## 2014-03-13 MED ORDER — BISACODYL 10 MG RE SUPP: 10.0000 mg | Freq: Every day | RECTAL | Status: DC | PRN

## 2014-03-13 MED ORDER — ACETAMINOPHEN 325 MG PO TABS: 650.0000 mg | ORAL_TABLET | Freq: Four times a day (QID) | ORAL | Status: DC | PRN

## 2014-03-13 MED ORDER — ENOXAPARIN SODIUM 30 MG/0.3ML ~~LOC~~ SOLN: 30.0000 mg | Freq: Two times a day (BID) | SUBCUTANEOUS | Status: DC

## 2014-03-13 NOTE — Progress Notes (Addendum)
ANTICOAGULATION CONSULT NOTE - Initial Consult  Pharmacy Consult for warfarin Indication: atrial fibrillation and VTE prophylaxis  No Known Allergies  Patient Measurements: Height: 5\' 11"  (180.3 cm) Weight: 233 lb (105.688 kg) IBW/kg (Calculated) : 75.3 Heparin Dosing Weight:   Vital Signs: Temp: 98.2 F (36.8 C) (05/21 0519) Temp src: Oral (05/21 0519) BP: 148/92 mmHg (05/21 0519) Pulse Rate: 60 (05/21 0519)  Labs:  Recent Labs  03/11/14 0424 03/12/14 0432 03/13/14 0442  HGB 13.3 13.1 13.5  HCT 37.7* 38.3* 40.7  PLT 110* 117* 116*  LABPROT 15.1 15.9* 17.0*  INR 1.22 1.30 1.42  CREATININE 0.93 0.83  --     Estimated Creatinine Clearance: 101 ml/min (by C-G formula based on Cr of 0.83).   Medical History: Past Medical History  Diagnosis Date  . Hypertension   . Hyperlipidemia   . Degenerative joint disease     Right knee  . Chronic anticoagulation   . Sleep apnea     Rx-CPAP  . Atrial fibrillation 06/2010  . Elevated cholesterol   . Shortness of breath     on exertion  . Nocturia   . BPH (benign prostatic hyperplasia)     Assessment: 37 YOM s/p L TKA 5/18, he has h/o afib on chronic warfarin therapy for stroke prevention.  Pharmacy asked to continue warfarin therapy.  His usual home dose is 10 mg daily except for 5 mg on Sundays.  Today's INR = 1.42  CBC:  Hgb = 13.5, pltc = 116 (preop value = 142)  On enoxaparin 30mg  SQ q12h until INR > 2.  Goal of Therapy:  INR 2-3 Monitor platelets by anticoagulation protocol: Yes   Plan:   Warfarin 10mg  PO tonight (INR trending up toward goal). Appropriate to discharge on home regimen (10mg  daily with 5mg  on Sunday)  Daily INR  D/C enoxaparin when INR > 2  Doreene Eland, PharmD, BCPS.   Pager: [phone]  03/13/2014,8:39 AM

## 2014-03-13 NOTE — Discharge Summary (Signed)
Physician Discharge Summary   Patient ID: Joshua [name] MRN: [id number] DOB/AGE: 07/22/1941 73 y.o.  Admit date: 03/10/2014 Discharge date: 03-13-2014  Primary Diagnosis:  Osteoarthritis Left knee(s)  Admission Diagnoses:  Past Medical History  Diagnosis Date  . Hypertension   . Hyperlipidemia   . Degenerative joint disease     Right knee  . Chronic anticoagulation   . Sleep apnea     Rx-CPAP  . Atrial fibrillation 06/2010  . Elevated cholesterol   . Shortness of breath     on exertion  . Nocturia   . BPH (benign prostatic hyperplasia)    Discharge Diagnoses:   Active Problems:   OA (osteoarthritis) of knee  Estimated body mass index is 32.51 kg/(m^2) as calculated from the following:   Height as of this encounter: '5\' 11"'  (1.803 m).   Weight as of this encounter: 105.688 kg (233 lb).  Procedure:  Procedure(s) (LRB): LEFT TOTAL KNEE ARTHROPLASTY (Left)   Consults: None  HPI: Joshua [name] is a 73 y.o. year old male with end stage OA of his left knee with progressively worsening pain and dysfunction. He has constant pain, with activity and at rest and significant functional deficits with difficulties even with ADLs. He has had extensive non-op management including analgesics, injections of cortisone and viscosupplements, and home exercise program, but remains in significant pain with significant dysfunction. Radiographs show bone on bone arthritis medial and patellofemoral. He presents now for left Total Knee Arthroplasty.   Laboratory Data: Admission on 03/10/2014  Component Date Value Ref Range Status  . Prothrombin Time 03/10/2014 13.6  11.6 - 15.2 seconds Final  . INR 03/10/2014 1.06  0.00 - 1.49 Final  . ABO/RH(D) 03/10/2014 O POS   Final  . Antibody Screen 03/10/2014 NEG   Final  . Sample Expiration 03/10/2014 03/13/2014   Final  . WBC 03/11/2014 13.2* 4.0 - 10.5 K/uL Final  . RBC 03/11/2014 4.13* 4.22 - 5.81 MIL/uL Final  . Hemoglobin 03/11/2014 13.3  13.0  - 17.0 g/dL Final  . HCT 03/11/2014 37.7* 39.0 - 52.0 % Final  . MCV 03/11/2014 91.3  78.0 - 100.0 fL Final  . MCH 03/11/2014 32.2  26.0 - 34.0 pg Final  . MCHC 03/11/2014 35.3  30.0 - 36.0 g/dL Final  . RDW 03/11/2014 12.7  11.5 - 15.5 % Final  . Platelets 03/11/2014 110* 150 - 400 K/uL Final   Comment: REPEATED TO VERIFY                          SPECIMEN CHECKED FOR CLOTS                          PLATELET COUNT CONFIRMED BY SMEAR  . Sodium 03/11/2014 140  137 - 147 mEq/L Final  . Potassium 03/11/2014 4.1  3.7 - 5.3 mEq/L Final  . Chloride 03/11/2014 105  96 - 112 mEq/L Final  . CO2 03/11/2014 26  19 - 32 mEq/L Final  . Glucose, Bld 03/11/2014 167* 70 - 99 mg/dL Final  . BUN 03/11/2014 14  6 - 23 mg/dL Final  . Creatinine, Ser 03/11/2014 0.93  0.50 - 1.35 mg/dL Final  . Calcium 03/11/2014 8.8  8.4 - 10.5 mg/dL Final  . GFR calc non Af Amer 03/11/2014 83* >90 mL/min Final  . GFR calc Af Amer 03/11/2014 >90  >90 mL/min Final   Comment: (NOTE)  The eGFR has been calculated using the CKD EPI equation.                          This calculation has not been validated in all clinical situations.                          eGFR's persistently <90 mL/min signify possible Chronic Kidney                          Disease.  Marland Kitchen Prothrombin Time 03/11/2014 15.1  11.6 - 15.2 seconds Final  . INR 03/11/2014 1.22  0.00 - 1.49 Final  . WBC 03/12/2014 14.0* 4.0 - 10.5 K/uL Final  . RBC 03/12/2014 4.12* 4.22 - 5.81 MIL/uL Final  . Hemoglobin 03/12/2014 13.1  13.0 - 17.0 g/dL Final  . HCT 03/12/2014 38.3* 39.0 - 52.0 % Final  . MCV 03/12/2014 93.0  78.0 - 100.0 fL Final  . MCH 03/12/2014 31.8  26.0 - 34.0 pg Final  . MCHC 03/12/2014 34.2  30.0 - 36.0 g/dL Final  . RDW 03/12/2014 13.0  11.5 - 15.5 % Final  . Platelets 03/12/2014 117* 150 - 400 K/uL Final   CONSISTENT WITH PREVIOUS RESULT  . Sodium 03/12/2014 141  137 - 147 mEq/L Final  . Potassium 03/12/2014 4.3  3.7 - 5.3 mEq/L  Final  . Chloride 03/12/2014 106  96 - 112 mEq/L Final  . CO2 03/12/2014 24  19 - 32 mEq/L Final  . Glucose, Bld 03/12/2014 134* 70 - 99 mg/dL Final  . BUN 03/12/2014 15  6 - 23 mg/dL Final  . Creatinine, Ser 03/12/2014 0.83  0.50 - 1.35 mg/dL Final  . Calcium 03/12/2014 9.0  8.4 - 10.5 mg/dL Final  . GFR calc non Af Amer 03/12/2014 86* >90 mL/min Final  . GFR calc Af Amer 03/12/2014 >90  >90 mL/min Final   Comment: (NOTE)                          The eGFR has been calculated using the CKD EPI equation.                          This calculation has not been validated in all clinical situations.                          eGFR's persistently <90 mL/min signify possible Chronic Kidney                          Disease.  Marland Kitchen Prothrombin Time 03/12/2014 15.9* 11.6 - 15.2 seconds Final  . INR 03/12/2014 1.30  0.00 - 1.49 Final  . WBC 03/13/2014 9.5  4.0 - 10.5 K/uL Final  . RBC 03/13/2014 4.31  4.22 - 5.81 MIL/uL Final  . Hemoglobin 03/13/2014 13.5  13.0 - 17.0 g/dL Final  . HCT 03/13/2014 40.7  39.0 - 52.0 % Final  . MCV 03/13/2014 94.4  78.0 - 100.0 fL Final  . MCH 03/13/2014 31.3  26.0 - 34.0 pg Final  . MCHC 03/13/2014 33.2  30.0 - 36.0 g/dL Final  . RDW 03/13/2014 13.1  11.5 - 15.5 % Final  . Platelets 03/13/2014 116* 150 - 400 K/uL Final   CONSISTENT WITH PREVIOUS RESULT  .  Prothrombin Time 03/13/2014 17.0* 11.6 - 15.2 seconds Final  . INR 03/13/2014 1.42  0.00 - 1.49 Final  Hospital Outpatient Visit on 02/28/2014  Component Date Value Ref Range Status  . aPTT 02/28/2014 41* 24 - 37 seconds Final   Comment:                                 IF BASELINE aPTT IS ELEVATED,                          SUGGEST PATIENT RISK ASSESSMENT                          BE USED TO DETERMINE APPROPRIATE                          ANTICOAGULANT THERAPY.  . WBC 02/28/2014 4.8  4.0 - 10.5 K/uL Final  . RBC 02/28/2014 4.76  4.22 - 5.81 MIL/uL Final  . Hemoglobin 02/28/2014 15.0  13.0 - 17.0 g/dL Final  . HCT  02/28/2014 43.5  39.0 - 52.0 % Final  . MCV 02/28/2014 91.4  78.0 - 100.0 fL Final  . MCH 02/28/2014 31.5  26.0 - 34.0 pg Final  . MCHC 02/28/2014 34.5  30.0 - 36.0 g/dL Final  . RDW 02/28/2014 12.7  11.5 - 15.5 % Final  . Platelets 02/28/2014 142* 150 - 400 K/uL Final  . Sodium 02/28/2014 141  137 - 147 mEq/L Final  . Potassium 02/28/2014 4.2  3.7 - 5.3 mEq/L Final  . Chloride 02/28/2014 104  96 - 112 mEq/L Final  . CO2 02/28/2014 24  19 - 32 mEq/L Final  . Glucose, Bld 02/28/2014 90  70 - 99 mg/dL Final  . BUN 02/28/2014 13  6 - 23 mg/dL Final  . Creatinine, Ser 02/28/2014 0.92  0.50 - 1.35 mg/dL Final  . Calcium 02/28/2014 9.0  8.4 - 10.5 mg/dL Final  . Total Protein 02/28/2014 7.1  6.0 - 8.3 g/dL Final  . Albumin 02/28/2014 3.8  3.5 - 5.2 g/dL Final  . AST 02/28/2014 26  0 - 37 U/L Final   Comment: SLIGHT HEMOLYSIS                          HEMOLYSIS AT THIS LEVEL MAY AFFECT RESULT  . ALT 02/28/2014 18  0 - 53 U/L Final  . Alkaline Phosphatase 02/28/2014 53  39 - 117 U/L Final  . Total Bilirubin 02/28/2014 0.8  0.3 - 1.2 mg/dL Final  . GFR calc non Af Amer 02/28/2014 83* >90 mL/min Final  . GFR calc Af Amer 02/28/2014 >90  >90 mL/min Final   Comment: (NOTE)                          The eGFR has been calculated using the CKD EPI equation.                          This calculation has not been validated in all clinical situations.                          eGFR's persistently <90 mL/min signify possible Chronic Kidney  Disease.  Marland Kitchen Prothrombin Time 02/28/2014 28.2* 11.6 - 15.2 seconds Final  . INR 02/28/2014 2.76* 0.00 - 1.49 Final  . Color, Urine 02/28/2014 YELLOW  YELLOW Final  . APPearance 02/28/2014 CLEAR  CLEAR Final  . Specific Gravity, Urine 02/28/2014 1.020  1.005 - 1.030 Final  . pH 02/28/2014 5.5  5.0 - 8.0 Final  . Glucose, UA 02/28/2014 NEGATIVE  NEGATIVE mg/dL Final  . Hgb urine dipstick 02/28/2014 TRACE* NEGATIVE Final  . Bilirubin Urine  02/28/2014 NEGATIVE  NEGATIVE Final  . Ketones, ur 02/28/2014 NEGATIVE  NEGATIVE mg/dL Final  . Protein, ur 02/28/2014 NEGATIVE  NEGATIVE mg/dL Final  . Urobilinogen, UA 02/28/2014 1.0  0.0 - 1.0 mg/dL Final  . Nitrite 02/28/2014 NEGATIVE  NEGATIVE Final  . Leukocytes, UA 02/28/2014 SMALL* NEGATIVE Final  . MRSA, PCR 02/28/2014 NEGATIVE  NEGATIVE Final  . Staphylococcus aureus 02/28/2014 NEGATIVE  NEGATIVE Final   Comment:                                 The Xpert SA Assay (FDA                          approved for NASAL specimens                          in patients over 80 years of age),                          is one component of                          a comprehensive surveillance                          program.  Test performance has                          been validated by [employer] for patients greater                          than or equal to 58 year old.                          It is not intended                          to diagnose infection nor to                          guide or monitor treatment.  . Squamous Epithelial / LPF 02/28/2014 RARE  RARE Final  . WBC, UA 02/28/2014 3-6  <3 WBC/hpf Final  . RBC / HPF 02/28/2014 0-2  <3 RBC/hpf Final  . Bacteria, UA 02/28/2014 FEW* RARE Final  Anti-coag visit on 02/05/2014  Component Date Value Ref Range Status  . INR 02/05/2014 2.4   Final     X-Rays:Dg Chest 2 View  02/28/2014   CLINICAL DATA:  Preop for knee arthroplasty  EXAM: CHEST  2 VIEW  COMPARISON:  01/04/2013  FINDINGS: Cardiomediastinal silhouette is stable. No acute infiltrate or pleural effusion. No pulmonary edema. Stable degenerative changes thoracic spine. Tortuous descending aorta again noted.  IMPRESSION: No active cardiopulmonary disease.   Electronically Signed   By: Lahoma Crocker M.D.   On: 02/28/2014 15:31    EKG: Orders placed in visit on 12/23/13  . EKG 12-LEAD     Hospital Course: Joshua [name] is a 73 y.o. who was  admitted to [facility]. They were brought to the operating room on 03/10/2014 and underwent Procedure(s): LEFT TOTAL KNEE ARTHROPLASTY.  Patient tolerated the procedure well and was later transferred to the recovery room and then to the orthopaedic floor for postoperative care.  They were given PO and IV analgesics for pain control following their surgery.  They were given 24 hours of postoperative antibiotics of  Anti-infectives   Start     Dose/Rate Route Frequency Ordered Stop   03/10/14 1400  ceFAZolin (ANCEF) IVPB 2 g/50 mL premix     2 g 100 mL/hr over 30 Minutes Intravenous Every 6 hours 03/10/14 1007 03/10/14 2021   03/10/14 0511  ceFAZolin (ANCEF) IVPB 2 g/50 mL premix     2 g 100 mL/hr over 30 Minutes Intravenous On call to O.R. 03/10/14 9811 03/10/14 0710     and started on DVT prophylaxis in the form of Lovenox and Coumadin.   PT and OT were ordered for total joint protocol.  Discharge planning consulted to help with postop disposition and equipment needs.  Patient had a good night on the evening of surgery getting up and walking about 50 feet.  They started to get up OOB with therapy on day one. Hemovac drain was pulled without difficulty.  Continued to work with therapy into day two.  Dressing was changed on day two and the incision was healing well.  By day three, the patient had progressed with therapy and meeting their goals.  Incision was healing well.  Patient was seen in rounds and was ready to go to [facility] for continued care and therapy.  Discharge to SNF  Diet - Cardiac diet  Follow up - in 2 weeks  Activity - WBAT  Disposition - Skilled nursing facility  Condition Upon Discharge - Good  D/C Meds - See DC Summary  DVT Prophylaxis - Lovenox and Coumadin  Continue Lovenox injections until the INR is therapeutic at or greater than 2.0. When INR reaches the therapeutic level of equal to or greater than 2.0, the patient may discontinue the Lovenox injections.    Take Coumadin for three weeks for postoperative protocol and then the patient may resume their previous Coumadin home regimen. The dose may need to be adjusted based upon the INR. Please follow the INR and titrate Coumadin dose for a therapeutic range between 2.0 and 3.0 INR. After completing the three weeks of Coumadin, the patient may resume their previous Coumadin home regimen.  Discharge Instructions   Call MD / Call 911    Complete by:  As directed   If you experience chest pain or shortness of breath, CALL 911 and be transported to the hospital emergency room.  If you develope a fever above 101 F, pus (white drainage) or increased drainage or redness at the wound, or calf pain, call your surgeon's office.     Change dressing    Complete by:  As directed   Change dressing daily with sterile 4  x 4 inch gauze dressing and apply TED hose. Do not submerge the incision under water.     Constipation Prevention    Complete by:  As directed   Drink plenty of fluids.  Prune juice may be helpful.  You may use a stool softener, such as Colace (over the counter) 100 mg twice a day.  Use MiraLax (over the counter) for constipation as needed.     Diet - low sodium heart healthy    Complete by:  As directed      Discharge instructions    Complete by:  As directed   Pick up stool softner and laxative for home. Do not submerge incision under water. May shower. Continue to use ice for pain and swelling from surgery.  Take Coumadin for three weeks for postoperative protocol and then the patient may resume their previous Coumadin home regimen.  The dose may need to be adjusted based upon the INR.  Please follow the INR and titrate Coumadin dose for a therapeutic range between 2.0 and 3.0 INR.  After completing the three weeks of Coumadin, the patient may resume their previous Coumadin home regimen.  Continue Lovenox injections until the INR is therapeutic at or greater than 2.0.  When INR reaches the  therapeutic level of equal to or greater than 2.0, the patient may discontinue the Lovenox injections.  When discharged from the skilled rehab facility, please have the facility set up the patient's [place] prior to being released.  Also provide the patient with their medications at time of release from the facility to include their pain medication, the muscle relaxants, and their blood thinner medication.  If the patient is still at the rehab facility at time of follow up appointment, please also assist the patient in arranging follow up appointment in our office and any transportation needs.     Do not put a pillow under the knee. Place it under the heel.    Complete by:  As directed      Do not sit on low chairs, stoools or toilet seats, as it may be difficult to get up from low surfaces    Complete by:  As directed      Driving restrictions    Complete by:  As directed   No driving until released by the physician.     Increase activity slowly as tolerated    Complete by:  As directed   Physical Therapy Twice A Day for total knee protocol.     Lifting restrictions    Complete by:  As directed   No lifting until released by the physician.     Patient may shower    Complete by:  As directed   You may shower without a dressing once there is no drainage.  Do not wash over the wound.  If drainage remains, do not shower until drainage stops.     TED hose    Complete by:  As directed   Use stockings (TED hose) for 3 weeks on both leg(s).  You may remove them at night for sleeping.     Weight bearing as tolerated    Complete by:  As directed             Medication List         acetaminophen 325 MG tablet  Commonly known as:  TYLENOL  Take 2 tablets (650 mg total) by mouth every 6 (six) hours as needed for mild pain (or  Fever >/= 101).     amLODipine 5 MG tablet  Commonly known as:  NORVASC  Take 5 mg by mouth daily before breakfast.     atenolol 100 MG tablet    Commonly known as:  TENORMIN  Take 100 mg by mouth 2 (two) times daily.     bisacodyl 10 MG suppository  Commonly known as:  DULCOLAX  Place 1 suppository (10 mg total) rectally daily as needed for moderate constipation.     digoxin 0.25 MG tablet  Commonly known as:  LANOXIN  Take 0.25 mg by mouth daily before breakfast.     DSS 100 MG Caps  Take 100 mg by mouth 2 (two) times daily.     enoxaparin 30 MG/0.3ML injection  Commonly known as:  LOVENOX  Inject 0.3 mLs (30 mg total) into the skin every 12 (twelve) hours. Continue Lovenox injections until the INR is therapeutic at or greater than 2.0.  When INR reaches the therapeutic level of equal to or greater than 2.0, the patient may discontinue the Lovenox injections.     lisinopril 20 MG tablet  Commonly known as:  PRINIVIL,ZESTRIL  Take 20 mg by mouth 2 (two) times daily.     methocarbamol 500 MG tablet  Commonly known as:  ROBAXIN  Take 1 tablet (500 mg total) by mouth every 6 (six) hours as needed for muscle spasms.     metoCLOPramide 5 MG tablet  Commonly known as:  REGLAN  Take 1-2 tablets (5-10 mg total) by mouth every 8 (eight) hours as needed for nausea (if ondansetron (ZOFRAN) ineffective.).     ondansetron 4 MG tablet  Commonly known as:  ZOFRAN  Take 1 tablet (4 mg total) by mouth every 6 (six) hours as needed for nausea.     oxyCODONE 5 MG immediate release tablet  Commonly known as:  Oxy IR/ROXICODONE  Take 1-2 tablets (5-10 mg total) by mouth every 3 (three) hours as needed for moderate pain, severe pain or breakthrough pain.     polyethylene glycol packet  Commonly known as:  MIRALAX / GLYCOLAX  Take 17 g by mouth daily as needed for mild constipation.     traMADol 50 MG tablet  Commonly known as:  ULTRAM  Take 1-2 tablets (50-100 mg total) by mouth every 6 (six) hours as needed (mild to moderate pain).     warfarin 10 MG tablet  Commonly known as:  COUMADIN  Take 0.5-1 tablets (5-10 mg total) by  mouth daily. Coumadin for 3 weeks for posto protocol, then patient may resume previous Coumadin home regimen. Dose may need to be adjusted based upon the INR.  Follow INR and titrate Coumadin dose for therapeutic range between 2.0 and 3.0 INR.  After completing the three weeks of Coumadin, the patient may resume their previous Coumadin home regimen. He takes 1 tablet daily except Sunday he takes 1/2 tablet.           Follow-up Information   Follow up with Gearlean Alf, MD. Schedule an appointment as soon as possible for a visit on 03/25/2014.   Specialty:  Orthopedic Surgery   Contact information:   [address] [place] [address] [phone]       Signed: Arlee Muslim, PA-C Orthopaedic Surgery 03/13/2014, 8:52 AM

## 2014-03-13 NOTE — Progress Notes (Signed)
   Subjective: 3 Days Post-Op Procedure(s) (LRB): LEFT TOTAL KNEE ARTHROPLASTY (Left) Patient reports pain as mild.   Patient seen in rounds with Dr. Wynelle Link. Patient is well, and has had no acute complaints or problems Patient is ready to go to [facility].  Objective: Vital signs in last 24 hours: Temp:  [98 F (36.7 C)-98.2 F (36.8 C)] 98.2 F (36.8 C) (05/21 0519) Pulse Rate:  [60-114] 60 (05/21 0519) Resp:  [16-18] 18 (05/21 0519) BP: (148-158)/(82-92) 148/92 mmHg (05/21 0519) SpO2:  [94 %-97 %] 95 % (05/21 0519) FiO2 (%):  [21 %] 21 % (05/20 2136)  Intake/Output from previous day:  Intake/Output Summary (Last 24 hours) at 03/13/14 0839 Last data filed at 03/13/14 0831  Gross per 24 hour  Intake    960 ml  Output   2500 ml  Net  -1540 ml    Intake/Output this shift: Total I/O In: 240 [P.O.:240] Out: 175 [Urine:175]  Labs:  Recent Labs  03/11/14 0424 03/12/14 0432 03/13/14 0442  HGB 13.3 13.1 13.5    Recent Labs  03/12/14 0432 03/13/14 0442  WBC 14.0* 9.5  RBC 4.12* 4.31  HCT 38.3* 40.7  PLT 117* 116*    Recent Labs  03/11/14 0424 03/12/14 0432  NA 140 141  K 4.1 4.3  CL 105 106  CO2 26 24  BUN 14 15  CREATININE 0.93 0.83  GLUCOSE 167* 134*  CALCIUM 8.8 9.0    Recent Labs  03/12/14 0432 03/13/14 0442  INR 1.30 1.42    EXAM: General - Patient is Alert, Appropriate and Oriented Extremity - Neurovascular intact Sensation intact distally Dorsiflexion/Plantar flexion intact Incision - clean, dry, no drainage, healing Motor Function - intact, moving foot and toes well on exam.   Assessment/Plan: 3 Days Post-Op Procedure(s) (LRB): LEFT TOTAL KNEE ARTHROPLASTY (Left) Procedure(s) (LRB): LEFT TOTAL KNEE ARTHROPLASTY (Left) Past Medical History  Diagnosis Date  . Hypertension   . Hyperlipidemia   . Degenerative joint disease     Right knee  . Chronic anticoagulation   . Sleep apnea     Rx-CPAP  . Atrial fibrillation 06/2010    . Elevated cholesterol   . Shortness of breath     on exertion  . Nocturia   . BPH (benign prostatic hyperplasia)    Active Problems:   OA (osteoarthritis) of knee  Estimated body mass index is 32.51 kg/(m^2) as calculated from the following:   Height as of this encounter: 5\' 11"  (1.803 m).   Weight as of this encounter: 105.688 kg (233 lb). Up with therapy Discharge to SNF Diet - Cardiac diet Follow up - in 2 weeks Activity - WBAT Disposition - Skilled nursing facility Condition Upon Discharge - Good D/C Meds - See DC Summary DVT Prophylaxis - Lovenox and Coumadin Continue Lovenox injections until the INR is therapeutic at or greater than 2.0.  When INR reaches the therapeutic level of equal to or greater than 2.0, the patient may discontinue the Lovenox injections. Take Coumadin for three weeks for postoperative protocol and then the patient may resume their previous Coumadin home regimen.  The dose may need to be adjusted based upon the INR.  Please follow the INR and titrate Coumadin dose for a therapeutic range between 2.0 and 3.0 INR.  After completing the three weeks of Coumadin, the patient may resume their previous Coumadin home regimen.  Arlee Muslim, PA-C Orthopaedic Surgery 03/13/2014, 8:39 AM

## 2014-03-13 NOTE — Progress Notes (Signed)
Physical Therapy Treatment Patient Details Name: MARS SCHEAFFER MRN: [id number] DOB: 1941/05/02 Today's Date: 03/13/2014    History of Present Illness s/p L TKA    PT Comments    POD # 3 amb in hallway then performed TKR TE's followed by ICE.   Follow Up Recommendations  SNF [facility])     Equipment Recommendations       Recommendations for Other Services       Precautions / Restrictions Precautions Precautions: Knee Precaution Comments: Pt able to perform SLR Restrictions Weight Bearing Restrictions: No Other Position/Activity Restrictions: WBAT    Mobility  Bed Mobility Overal bed mobility: Needs Assistance Bed Mobility: Supine to Sit     Supine to sit: Min guard     General bed mobility comments: increased time  Transfers Overall transfer level: Needs assistance Equipment used: Rolling walker (2 wheeled) Transfers: Sit to/from Stand Sit to Stand: Min guard;Supervision         General transfer comment: cues for hand placement and wt shift  Ambulation/Gait Ambulation/Gait assistance: Min guard Ambulation Distance (Feet): 84 Feet Assistive device: Rolling walker (2 wheeled) Gait Pattern/deviations: Step-to pattern;Trunk flexed Gait velocity: decreased   General Gait Details: cues for sequence, posture and position from [employer]            Wheelchair Mobility    Modified Rankin (Stroke Patients Only)       Balance                                    Cognition                            Exercises   Total Knee Replacement TE's 10 reps B LE ankle pumps 10 reps towel squeezes 10 reps knee presses 10 reps heel slides  10 reps SAQ's 10 reps SLR's 10 reps ABD Followed by ICE     General Comments        Pertinent Vitals/Pain C/o 3/10 ICE applied    Home Living                      Prior Function            PT Goals (current goals can now be found in the care plan section)  Progress towards PT goals: Progressing toward goals    Frequency       PT Plan Current plan remains appropriate    Co-evaluation             End of Session Equipment Utilized During Treatment: Gait belt Activity Tolerance: Patient tolerated treatment well Patient left: in chair;with call bell/phone within reach     Time: 1023-1048 PT Time Calculation (min): 25 min  Charges:  $Gait Training: 8-22 mins $Therapeutic Exercise: 8-22 mins                    G Codes:      Rica Koyanagi  PTA [facility]  Acute  Rehab Pager      [phone]

## 2014-03-13 NOTE — Discharge Instructions (Signed)
° °Dr. Frank Aluisio °Total Joint Specialist °[facility] °[address] °[place], [place] [address] °[phone] ° °TOTAL KNEE REPLACEMENT POSTOPERATIVE DIRECTIONS ° ° ° °Knee Rehabilitation, Guidelines Following Surgery  °Results after knee surgery are often greatly improved when you follow the exercise, range of motion and muscle strengthening exercises prescribed by your doctor. Safety measures are also important to protect the knee from further injury. Any time any of these exercises cause you to have increased pain or swelling in your knee joint, decrease the amount until you are comfortable again and slowly increase them. If you have problems or questions, call your caregiver or physical therapist for advice.  ° °HOME CARE INSTRUCTIONS  °Remove items at home which could result in a fall. This includes throw rugs or furniture in walking pathways.  °Continue medications as instructed at time of discharge. °You may have some home medications which will be placed on hold until you complete the course of blood thinner medication.  °You may start showering once you are discharged home but do not submerge the incision under water. Just pat the incision dry and apply a dry gauze dressing on daily. °Walk with walker as instructed.  °You may resume a sexual relationship in one month or when given the OK by  your doctor.  °· Use walker as long as suggested by your caregivers. °· Avoid periods of inactivity such as sitting longer than an hour when not asleep. This helps prevent blood clots.  °You may put full weight on your legs and walk as much as is comfortable.  °You may return to work once you are cleared by your doctor.  °Do not drive a car for 6 weeks or until released by you surgeon.  °· Do not drive while taking narcotics.  °Wear the elastic stockings for three weeks following surgery during the day but you may remove then at night. °Make sure you keep all of your appointments after your  operation with all of your doctors and caregivers. You should call the office at the above phone number and make an appointment for approximately two weeks after the date of your surgery. °Change the dressing daily and reapply a dry dressing each time. °Please pick up a stool softener and laxative for home use as long as you are requiring pain medications. °· Continue to use ice on the knee for pain and swelling from surgery. You may notice swelling that will progress down to the foot and ankle.  This is normal after surgery.  Elevate the leg when you are not up walking on it.   °It is important for you to complete the blood thinner medication as prescribed by your doctor. °· Continue to use the breathing machine which will help keep your temperature down.  It is common for your temperature to cycle up and down following surgery, especially at night when you are not up moving around and exerting yourself.  The breathing machine keeps your lungs expanded and your temperature down. ° °RANGE OF MOTION AND STRENGTHENING EXERCISES  °Rehabilitation of the knee is important following a knee injury or an operation. After just a few days of immobilization, the muscles of the thigh which control the knee become weakened and shrink (atrophy). Knee exercises are designed to build up the tone and strength of the thigh muscles and to improve knee motion. Often times heat used for twenty to thirty minutes before working out will loosen up your tissues and help with improving the   range of motion but do not use heat for the first two weeks following surgery. These exercises can be done on a training (exercise) mat, on the floor, on a table or on a bed. Use what ever works the best and is most comfortable for you Knee exercises include:  Leg Lifts - While your knee is still immobilized in a splint or cast, you can do straight leg raises. Lift the leg to 60 degrees, hold for 3 sec, and slowly lower the leg. Repeat 10-20 times 2-3  times daily. Perform this exercise against resistance later as your knee gets better.  Quad and Hamstring Sets - Tighten up the muscle on the front of the thigh (Quad) and hold for 5-10 sec. Repeat this 10-20 times hourly. Hamstring sets are done by pushing the foot backward against an object and holding for 5-10 sec. Repeat as with quad sets.  A rehabilitation program following serious knee injuries can speed recovery and prevent re-injury in the future due to weakened muscles. Contact your doctor or a physical therapist for more information on knee rehabilitation.   SKILLED REHAB INSTRUCTIONS: If the patient is transferred to a skilled rehab facility following release from the hospital, a list of the current medications will be sent to the facility for the patient to continue.  When discharged from the skilled rehab facility, please have the facility set up the patient's [place] prior to being released. Also, the skilled facility will be responsible for providing the patient with their medications at time of release from the facility to include their pain medication, the muscle relaxants, and their blood thinner medication. If the patient is still at the rehab facility at time of the two week follow up appointment, the skilled rehab facility will also need to assist the patient in arranging follow up appointment in our office and any transportation needs.  MAKE SURE YOU:  Understand these instructions.  Will watch your condition.  Will get help right away if you are not doing well or get worse.    Pick up stool softner and laxative for home. Do not submerge incision under water. May shower. Continue to use ice for pain and swelling from surgery.  Physical Therapy Twice A Day  Take Coumadin for three weeks for postoperative protocol and then the patient may resume their previous Coumadin home regimen.  The dose may need to be adjusted based upon the INR.  Please follow the  INR and titrate Coumadin dose for a therapeutic range between 2.0 and 3.0 INR.  After completing the three weeks of Coumadin, the patient may resume their previous Coumadin home regimen.  Continue Lovenox injections until the INR is therapeutic at or greater than 2.0.  When INR reaches the therapeutic level of equal to or greater than 2.0, the patient may discontinue the Lovenox injections.  When discharged from the skilled rehab facility, please have the facility set up the patient's [place] prior to being released.  Also provide the patient with their medications at time of release from the facility to include their pain medication, the muscle relaxants, and their blood thinner medication.  If the patient is still at the rehab facility at time of follow up appointment, please also assist the patient in arranging follow up appointment in our office and any transportation needs.

## 2014-03-13 NOTE — Progress Notes (Signed)
Clinical Social Work Department CLINICAL SOCIAL WORK PLACEMENT NOTE 03/13/2014  Patient:  TRAMOND, SLINKER A  Account Number:  [id number] Admit date:  03/10/2014  Clinical Social Worker:  Werner Lean, LCSW  Date/time:  03/10/2014 02:00 PM  Clinical Social Work is seeking post-discharge placement for this patient at the following level of care:   SKILLED NURSING   (*CSW will update this form in Epic as items are completed)     Patient/family provided with [facility] Department of Clinical Social Work's list of facilities offering this level of care within the geographic area requested by the patient (or if unable, by the patient's family).  03/10/2014  Patient/family informed of their freedom to choose among providers that offer the needed level of care, that participate in Medicare, Medicaid or managed care program needed by the patient, have an available bed and are willing to accept the patient.  03/10/2014  Patient/family informed of [facility]' ownership interest in [facility], as well as of the fact that they are under no obligation to receive care at this facility.  PASARR submitted to EDS on 03/10/2014 PASARR number received from EDS on 03/10/2014  FL2 transmitted to all facilities in geographic area requested by pt/family on  03/10/2014 FL2 transmitted to all facilities within larger geographic area on   Patient informed that his/her managed care company has contracts with or will negotiate with  certain facilities, including the following:     Patient/family informed of bed offers received:  03/10/2014 Patient chooses bed at [facility] Physician recommends and patient chooses bed at    Patient to be transferred to  on  03/13/2014 Patient to be transferred to facility by FAMILY  The following physician request were entered in Epic:   Additional Comments:  Admissions at [facility] contacted [facility] following pt's / spouse arrival . Spouse was not  pleased with pt's shared room and declined to have him admitted. Pt was aware of shared room prior to hospital d/c and voiced agreement with this arrangement. NSG has been alerted and will update MD. NSG will also contact RNCM for assistance with [place].  Werner Lean LCSW [phone]

## 2014-03-14 DIAGNOSIS — Z96659 Presence of unspecified artificial knee joint: Secondary | ICD-10-CM | POA: Diagnosis not present

## 2014-03-14 DIAGNOSIS — Z5189 Encounter for other specified aftercare: Secondary | ICD-10-CM | POA: Diagnosis not present

## 2014-03-14 DIAGNOSIS — Z471 Aftercare following joint replacement surgery: Secondary | ICD-10-CM | POA: Diagnosis not present

## 2014-03-14 DIAGNOSIS — I4891 Unspecified atrial fibrillation: Secondary | ICD-10-CM | POA: Diagnosis not present

## 2014-03-14 DIAGNOSIS — I10 Essential (primary) hypertension: Secondary | ICD-10-CM | POA: Diagnosis not present

## 2014-03-14 DIAGNOSIS — R269 Unspecified abnormalities of gait and mobility: Secondary | ICD-10-CM | POA: Diagnosis not present

## 2014-03-17 DIAGNOSIS — I10 Essential (primary) hypertension: Secondary | ICD-10-CM | POA: Diagnosis not present

## 2014-03-17 DIAGNOSIS — I4891 Unspecified atrial fibrillation: Secondary | ICD-10-CM | POA: Diagnosis not present

## 2014-03-17 DIAGNOSIS — R269 Unspecified abnormalities of gait and mobility: Secondary | ICD-10-CM | POA: Diagnosis not present

## 2014-03-17 DIAGNOSIS — Z5189 Encounter for other specified aftercare: Secondary | ICD-10-CM | POA: Diagnosis not present

## 2014-03-17 DIAGNOSIS — Z96659 Presence of unspecified artificial knee joint: Secondary | ICD-10-CM | POA: Diagnosis not present

## 2014-03-17 DIAGNOSIS — Z471 Aftercare following joint replacement surgery: Secondary | ICD-10-CM | POA: Diagnosis not present

## 2014-03-18 DIAGNOSIS — I10 Essential (primary) hypertension: Secondary | ICD-10-CM | POA: Diagnosis not present

## 2014-03-18 DIAGNOSIS — R269 Unspecified abnormalities of gait and mobility: Secondary | ICD-10-CM | POA: Diagnosis not present

## 2014-03-18 DIAGNOSIS — Z5189 Encounter for other specified aftercare: Secondary | ICD-10-CM | POA: Diagnosis not present

## 2014-03-18 DIAGNOSIS — Z471 Aftercare following joint replacement surgery: Secondary | ICD-10-CM | POA: Diagnosis not present

## 2014-03-18 DIAGNOSIS — I4891 Unspecified atrial fibrillation: Secondary | ICD-10-CM | POA: Diagnosis not present

## 2014-03-18 DIAGNOSIS — Z96659 Presence of unspecified artificial knee joint: Secondary | ICD-10-CM | POA: Diagnosis not present

## 2014-03-19 DIAGNOSIS — Z471 Aftercare following joint replacement surgery: Secondary | ICD-10-CM | POA: Diagnosis not present

## 2014-03-19 DIAGNOSIS — Z96659 Presence of unspecified artificial knee joint: Secondary | ICD-10-CM | POA: Diagnosis not present

## 2014-03-19 DIAGNOSIS — I10 Essential (primary) hypertension: Secondary | ICD-10-CM | POA: Diagnosis not present

## 2014-03-19 DIAGNOSIS — Z5189 Encounter for other specified aftercare: Secondary | ICD-10-CM | POA: Diagnosis not present

## 2014-03-19 DIAGNOSIS — R269 Unspecified abnormalities of gait and mobility: Secondary | ICD-10-CM | POA: Diagnosis not present

## 2014-03-19 DIAGNOSIS — I4891 Unspecified atrial fibrillation: Secondary | ICD-10-CM | POA: Diagnosis not present

## 2014-03-20 ENCOUNTER — Ambulatory Visit (INDEPENDENT_AMBULATORY_CARE_PROVIDER_SITE_OTHER): Payer: Medicare Other | Admitting: *Deleted

## 2014-03-20 DIAGNOSIS — I4891 Unspecified atrial fibrillation: Secondary | ICD-10-CM | POA: Diagnosis not present

## 2014-03-20 DIAGNOSIS — Z5181 Encounter for therapeutic drug level monitoring: Secondary | ICD-10-CM

## 2014-03-20 DIAGNOSIS — Z7901 Long term (current) use of anticoagulants: Secondary | ICD-10-CM

## 2014-03-20 LAB — POCT INR: INR: 1.6

## 2014-03-21 DIAGNOSIS — I4891 Unspecified atrial fibrillation: Secondary | ICD-10-CM | POA: Diagnosis not present

## 2014-03-21 DIAGNOSIS — I10 Essential (primary) hypertension: Secondary | ICD-10-CM | POA: Diagnosis not present

## 2014-03-21 DIAGNOSIS — Z5189 Encounter for other specified aftercare: Secondary | ICD-10-CM | POA: Diagnosis not present

## 2014-03-21 DIAGNOSIS — Z96659 Presence of unspecified artificial knee joint: Secondary | ICD-10-CM | POA: Diagnosis not present

## 2014-03-21 DIAGNOSIS — R269 Unspecified abnormalities of gait and mobility: Secondary | ICD-10-CM | POA: Diagnosis not present

## 2014-03-21 DIAGNOSIS — Z471 Aftercare following joint replacement surgery: Secondary | ICD-10-CM | POA: Diagnosis not present

## 2014-03-24 DIAGNOSIS — R269 Unspecified abnormalities of gait and mobility: Secondary | ICD-10-CM | POA: Diagnosis not present

## 2014-03-24 DIAGNOSIS — I4891 Unspecified atrial fibrillation: Secondary | ICD-10-CM | POA: Diagnosis not present

## 2014-03-24 DIAGNOSIS — Z471 Aftercare following joint replacement surgery: Secondary | ICD-10-CM | POA: Diagnosis not present

## 2014-03-24 DIAGNOSIS — Z96659 Presence of unspecified artificial knee joint: Secondary | ICD-10-CM | POA: Diagnosis not present

## 2014-03-24 DIAGNOSIS — Z5189 Encounter for other specified aftercare: Secondary | ICD-10-CM | POA: Diagnosis not present

## 2014-03-24 DIAGNOSIS — I10 Essential (primary) hypertension: Secondary | ICD-10-CM | POA: Diagnosis not present

## 2014-03-26 DIAGNOSIS — I4891 Unspecified atrial fibrillation: Secondary | ICD-10-CM | POA: Diagnosis not present

## 2014-03-26 DIAGNOSIS — I10 Essential (primary) hypertension: Secondary | ICD-10-CM | POA: Diagnosis not present

## 2014-03-26 DIAGNOSIS — Z5189 Encounter for other specified aftercare: Secondary | ICD-10-CM | POA: Diagnosis not present

## 2014-03-26 DIAGNOSIS — Z96659 Presence of unspecified artificial knee joint: Secondary | ICD-10-CM | POA: Diagnosis not present

## 2014-03-26 DIAGNOSIS — Z471 Aftercare following joint replacement surgery: Secondary | ICD-10-CM | POA: Diagnosis not present

## 2014-03-26 DIAGNOSIS — R269 Unspecified abnormalities of gait and mobility: Secondary | ICD-10-CM | POA: Diagnosis not present

## 2014-03-28 DIAGNOSIS — I4891 Unspecified atrial fibrillation: Secondary | ICD-10-CM | POA: Diagnosis not present

## 2014-03-28 DIAGNOSIS — Z471 Aftercare following joint replacement surgery: Secondary | ICD-10-CM | POA: Diagnosis not present

## 2014-03-28 DIAGNOSIS — I10 Essential (primary) hypertension: Secondary | ICD-10-CM | POA: Diagnosis not present

## 2014-03-28 DIAGNOSIS — Z5189 Encounter for other specified aftercare: Secondary | ICD-10-CM | POA: Diagnosis not present

## 2014-03-28 DIAGNOSIS — Z96659 Presence of unspecified artificial knee joint: Secondary | ICD-10-CM | POA: Diagnosis not present

## 2014-03-28 DIAGNOSIS — R269 Unspecified abnormalities of gait and mobility: Secondary | ICD-10-CM | POA: Diagnosis not present

## 2014-03-31 ENCOUNTER — Ambulatory Visit (INDEPENDENT_AMBULATORY_CARE_PROVIDER_SITE_OTHER): Payer: Medicare Other | Admitting: *Deleted

## 2014-03-31 ENCOUNTER — Ambulatory Visit (HOSPITAL_COMMUNITY)
Admission: RE | Admit: 2014-03-31 | Discharge: 2014-03-31 | Disposition: A | Discharge status: Home / Self Care | Payer: Medicare Other | Source: Ambulatory Visit | Attending: Orthopedic Surgery | Admitting: Orthopedic Surgery

## 2014-03-31 ENCOUNTER — Telehealth: Payer: Self-pay | Admitting: Cardiology

## 2014-03-31 DIAGNOSIS — I4891 Unspecified atrial fibrillation: Secondary | ICD-10-CM | POA: Diagnosis not present

## 2014-03-31 DIAGNOSIS — M25669 Stiffness of unspecified knee, not elsewhere classified: Secondary | ICD-10-CM | POA: Insufficient documentation

## 2014-03-31 DIAGNOSIS — I10 Essential (primary) hypertension: Secondary | ICD-10-CM | POA: Insufficient documentation

## 2014-03-31 DIAGNOSIS — M7989 Other specified soft tissue disorders: Secondary | ICD-10-CM | POA: Insufficient documentation

## 2014-03-31 DIAGNOSIS — IMO0001 Reserved for inherently not codable concepts without codable children: Secondary | ICD-10-CM | POA: Insufficient documentation

## 2014-03-31 DIAGNOSIS — Z7901 Long term (current) use of anticoagulants: Secondary | ICD-10-CM

## 2014-03-31 DIAGNOSIS — M25569 Pain in unspecified knee: Secondary | ICD-10-CM | POA: Insufficient documentation

## 2014-03-31 DIAGNOSIS — R262 Difficulty in walking, not elsewhere classified: Secondary | ICD-10-CM | POA: Diagnosis not present

## 2014-03-31 DIAGNOSIS — R609 Edema, unspecified: Secondary | ICD-10-CM

## 2014-03-31 DIAGNOSIS — Z5181 Encounter for therapeutic drug level monitoring: Secondary | ICD-10-CM | POA: Diagnosis not present

## 2014-03-31 LAB — POCT INR: INR: 3.1

## 2014-03-31 MED ORDER — WARFARIN SODIUM 10 MG PO TABS: ORAL_TABLET | ORAL | Status: DC

## 2014-03-31 NOTE — Evaluation (Signed)
Physical Therapy Evaluation  Patient Details  Name: Joshua [name] MRN: [id number] Date of Birth: 10/27/1940  Today's Date: 03/31/2014 Time: 0900-0930 PT Time Calculation (min): 30 min Charge:  evaluation             Visit#: 1 of 12  Re-eval: 04/30/14 Assessment Diagnosis: Lt TKR Surgical Date: 03/10/14 Next MD Visit: 04/17/2014 Prior Therapy: HH  Authorization:      Authorization Time Period: medicare  Authorization Visit#:   of     Past Medical History:  Past Medical History  Diagnosis Date  . Hypertension   . Hyperlipidemia   . Degenerative joint disease     Right knee  . Chronic anticoagulation   . Sleep apnea     Rx-CPAP  . Atrial fibrillation 06/2010  . Elevated cholesterol   . Shortness of breath     on exertion  . Nocturia   . BPH (benign prostatic hyperplasia)    Past Surgical History:  Past Surgical History  Procedure Laterality Date  . Ventral hernia repair  03/2008, 02/2009  . Colonoscopy  03/10/2005  . 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 Total knee arthroplasty Left 03/10/2014    Procedure: LEFT TOTAL KNEE ARTHROPLASTY;  Surgeon: Joshua Alf, MD;  Location: [facility];  Service: Orthopedics;  Laterality: Left;    Subjective Symptoms/Limitations Symptoms: Joshua [name] states that he is doing very well from his Lt TKR on 03/10/2014.  He currently goes without his cane in his house and uses his cane outside.   His main problem at this time is it is difficult to sleep.  He states he is icing twice a day.  Therapist recommended at least doubling this.   Pertinent History: Rt TKR 01/2013 How long can you sit comfortably?: sitting is okay for an hour. How long can you stand comfortably?: 10-15 minutes.  How long can you walk comfortably?: 20 minutes. Pain Assessment Currently in Pain?: No/denies (worst pain is a 5/10) Pain  Score: 5  Pain Location: Knee Pain Orientation: Left Pain Type: Surgical pain    Balance Screening Balance Screen Has the patient fallen in the past 6 months: No    Cognition/Observation Observation/Other Assessments Observations: increased edema  Sensation/Coordination/Flexibility/Functional Tests Functional Tests Functional Tests: foto 56  Assessment LLE AROM (degrees) Left Knee Extension: 15 Left Knee Flexion: 105 LLE Strength Left Hip Flexion: 5/5 Left Hip Extension: 5/5 Left Hip ABduction: 5/5 Left Hip ADduction: 5/5 Left Knee Flexion: 5/5 Left Knee Extension: 5/5 Left Ankle Dorsiflexion: 5/5  Exercise/Treatments    Stretches Active Hamstring Stretch: 3 reps;30 seconds     Supine Quad Sets: 5 reps Heel Slides: 5 reps   Manual Therapy Manual Therapy: Edema management Edema Management: decongestive techniques to Lt Knee to decrease swelling  Physical Therapy Assessment and Plan PT Assessment and Plan Clinical Impression Statement: Pt s/p Lt TKR who is referred to therapy to maximize his functional ability.  Pt exam demonstrates normal strength, decreased ROM , increased edema and increase pain.  Pt will benefit from skilled PT to maximize his functional abiliyt and improve his quality of life.  Pt will benefit from skilled therapeutic intervention in order to improve on the following deficits: Decreased range of motion;Decreased balance;Increased edema;Pain Rehab Potential: Good PT Frequency: Min 3X/week PT Duration: 4 weeks PT Treatment/Interventions: Gait training;Stair training;Therapeutic exercise;Manual techniques;Patient/family education PT Plan: treatment  to emphasis improving extension and decreasing edema.  Begin standing terminal extension, slant board stretch, SLS and heel raises     Goals Home Exercise Program Pt/caregiver will Perform Home Exercise Program: For increased ROM PT Goal: Perform Home Exercise Program - Progress: Goal set  today PT Short Term Goals Time to Complete Short Term Goals: 2 weeks PT Short Term Goal 1: ROM 5-120 to allow normalized gt and allow pt to squat. PT Short Term Goal 2: Pt to be sleeping the whole night through PT Long Term Goals Time to Complete Long Term Goals: 4 weeks PT Long Term Goal 1: Pt to be walking outside without assistive device PT Long Term Goal 2: Pt to be able to stand for 30 minutes to socialize  Long Term Goal 3: Pt to be able to be on his feet for over an hour to be able to fish Long Term Goal 4: Pt to be able to go up steps in a reciprocal manner  Problem List Patient Active Problem List   Diagnosis Date Noted  . Edema 03/31/2014  . Encounter for therapeutic drug monitoring 12/23/2013  . Knee stiffness 02/06/2013  . Knee pain 02/06/2013  . Difficulty in walking 02/06/2013  . Acute blood loss anemia 01/17/2013  . Postop Hyponatremia 01/15/2013  . OA (osteoarthritis) of knee 01/14/2013  . Thrombocytopenia 05/26/2011  . Sleep apnea   . Degenerative joint disease   . Chronic anticoagulation 02/02/2011  . Hyperlipidemia 07/29/2010  . Atrial fibrillation 07/29/2010  . Hypertension 12/07/2007    General Behavior During Therapy: [employer] for tasks assessed/performed PT Plan of Care PT Home Exercise Plan: made but pt did not pick up will be up front  GP Functional Limitation: Mobility: Walking and moving around Mobility: Walking and Moving Around Current Status (Z6109): At least 40 percent but less than 60 percent impaired, limited or restricted Mobility: Walking and Moving Around Goal Status [phone]): At least 20 percent but less than 40 percent impaired, limited or restricted  Joshua [name] 03/31/2014, 10:36 AM  Physician Documentation Your signature is required to indicate approval of the treatment plan as stated above.  Please sign and either send electronically or make a copy of this report for your files and return this physician signed original.   Please  mark one 1.__approve of plan  2. ___approve of plan with the following conditions.   ______________________________                                                          _____________________ Physician Signature                                                                                                             Date

## 2014-03-31 NOTE — Telephone Encounter (Signed)
Sent to Lisa.

## 2014-04-01 ENCOUNTER — Ambulatory Visit (HOSPITAL_COMMUNITY)
Admission: RE | Admit: 2014-04-01 | Discharge: 2014-04-01 | Disposition: A | Discharge status: Home / Self Care | Payer: Medicare Other | Source: Ambulatory Visit | Attending: Internal Medicine | Admitting: Internal Medicine

## 2014-04-01 DIAGNOSIS — R609 Edema, unspecified: Secondary | ICD-10-CM

## 2014-04-01 NOTE — Progress Notes (Signed)
Physical Therapy Treatment Patient Details  Name: Joshua [name] MRN: [id number] Date of Birth: 1941-05-06  Today's Date: 04/01/2014 Time: 1301-1358 PT Time Calculation (min): 57 min 10' ice 1-0' massage 36' TE  Visit#: 2 of 12  Re-eval: 04/30/14    Authorization:    Authorization Time Period: medicare  Authorization Visit#:   of     Subjective: Symptoms/Limitations Symptoms: Patient reports no pain today, went to the [employer] this morning and rode NUST[place] as well as bike, Patient did PRone hang yesterday at home and could only tolerate 88mins Pain Assessment Pain Score: 0-No pain  Precautions/Restrictions     Exercise/Treatments Mobility/Balance        Stretches Active Hamstring Stretch: 3 reps;30 seconds Passive Hamstring Stretch: Limitations (14"box) Knee: Self-Stretch to increase Flexion: Limitations (10 reps 5" holds/14") Gastroc Stretch: 3 reps;30 seconds Aerobic Stationary Bike: seat@11  8'   Standing Heel Raises: Limitations (toe raises/heel raises done at home) Terminal Knee Extension: Left;10 reps;Limitations (5") SLS: 2 x 30" with 2 fingers     Modalities Modalities: Cryotherapy Manual Therapy Manual Therapy: Edema management Edema Management: retro massage to decrease swelling and lateral/medial tightness Cryotherapy Number Minutes Cryotherapy: 10 Minutes Cryotherapy Location: Knee Type of Cryotherapy: Ice pack  Physical Therapy Assessment and Plan PT Assessment and Plan Clinical Impression Statement: Patient states he performs HEP atleast 2x daily, some exercises to excess (heel raises 40 reps, tight gastroc) advised pt to lessen reps and begin home gastroc stretch, add toe raises rather than heel raises due to pt already performing at home. added SLS, with 2 fingers, pt unable to balance more than 1 second without UE :PT Plan: treatment to emphasis improving extension and decreasing edema. PT Plan: treatment to emphasis improving extension and  decreasing edema.  Continue with PT POC    Goals    Problem List Patient Active Problem List   Diagnosis Date Noted  . Edema 03/31/2014  . Encounter for therapeutic drug monitoring 12/23/2013  . Knee stiffness 02/06/2013  . Knee pain 02/06/2013  . Difficulty in walking 02/06/2013  . Acute blood loss anemia 01/17/2013  . Postop Hyponatremia 01/15/2013  . OA (osteoarthritis) of knee 01/14/2013  . Thrombocytopenia 05/26/2011  . Sleep apnea   . Degenerative joint disease   . Chronic anticoagulation 02/02/2011  . Hyperlipidemia 07/29/2010  . Atrial fibrillation 07/29/2010  . Hypertension 12/07/2007    PT - End of Session Activity Tolerance: Patient tolerated treatment well  GP    Molli Knock 04/01/2014, 3:55 PM

## 2014-04-02 ENCOUNTER — Ambulatory Visit (HOSPITAL_COMMUNITY): Payer: Medicare Other | Admitting: Physical Therapy

## 2014-04-04 ENCOUNTER — Ambulatory Visit (HOSPITAL_COMMUNITY)
Admission: RE | Admit: 2014-04-04 | Discharge: 2014-04-04 | Disposition: A | Discharge status: Home / Self Care | Payer: Medicare Other | Source: Ambulatory Visit | Attending: Internal Medicine | Admitting: Internal Medicine

## 2014-04-04 DIAGNOSIS — R609 Edema, unspecified: Secondary | ICD-10-CM

## 2014-04-04 NOTE — Progress Notes (Signed)
Physical Therapy Treatment Patient Details  Name: Joshua [name] MRN: [id number] Date of Birth: Nov 14, 1940  Today's Date: 04/04/2014 Time: 7902-4097 PT Time Calculation (min): 50 min Charge:  There ex [id number]; ;manual [phone]  Visit#: 3 of 12  Re-eval: 04/30/14     Authorization Visit#: 3 of 10   Subjective: Symptoms/Limitations Symptoms: Pt continues to have minimal to no pain  Pt main concern is stiffness and ROM    Exercise/Treatments       Stretches Passive Hamstring Stretch: Limitations Passive Hamstring Stretch Limitations: 3 way ham 30" x 2  Gastroc Stretch: 3 reps;30 seconds;Limitations Gastroc Stretch Limitations: slant board  Soleus Stretch: Limitations Soleus Stretch Limitations: lunge onto 14' box for improved flrexion x 30" Aerobic Stationary Bike: nustep L4 x 10:00 Standing Heel Raises: 10 reps;Limitations Heel Raises Limitations: no hands Knee Flexion: 10 reps;Limitations Knee Flexion Limitations: 8" box  Terminal Knee Extension: 10 reps Rocker Board: 2 minutes SLS with Vectors: 10" x 3   Supine Quad Sets: 10 reps Heel Slides: 5 reps Knee Extension: PROM Knee Flexion: PROM   Prone  Prone Knee Hang: Limitations Prone Knee Hang Limitations: with manual to decrease fascial restrictions.   Manual Therapy Manual Therapy: Edema management Edema Management: manual techniques to decrease swelling  Physical Therapy Assessment and Plan PT Assessment and Plan Clinical Impression Statement: Pt continues to ambulate flat footed and with LE ER.  Pt instructed in proper heel toe gait.  Pt treatment emphasized ROM and balance as pt has good strength.  PT Plan: treatment to emphasis improving extension and decreasing edema.  Continue with PT POC    Goals  progressing  Problem List Patient Active Problem List   Diagnosis Date Noted  . Edema 03/31/2014  . Encounter for therapeutic drug monitoring 12/23/2013  . Knee stiffness 02/06/2013  . Knee  pain 02/06/2013  . Difficulty in walking 02/06/2013  . Acute blood loss anemia 01/17/2013  . Postop Hyponatremia 01/15/2013  . OA (osteoarthritis) of knee 01/14/2013  . Thrombocytopenia 05/26/2011  . Sleep apnea   . Degenerative joint disease   . Chronic anticoagulation 02/02/2011  . Hyperlipidemia 07/29/2010  . Atrial fibrillation 07/29/2010  . Hypertension 12/07/2007       GP    Caelan Branden,CINDY 04/04/2014, 5:40 PM

## 2014-04-07 ENCOUNTER — Ambulatory Visit (HOSPITAL_COMMUNITY)
Admission: RE | Admit: 2014-04-07 | Discharge: 2014-04-07 | Disposition: A | Discharge status: Home / Self Care | Payer: Medicare Other | Source: Ambulatory Visit | Attending: Orthopedic Surgery | Admitting: Orthopedic Surgery

## 2014-04-07 NOTE — Progress Notes (Signed)
Physical Therapy Treatment Patient Details  Name: Joshua [name] MRN: [id number] Date of Birth: Sep 13, 1941  Today's Date: 04/07/2014 Time: 1102-1210 PT Time Calculation (min): 68 min 10' ice 10' massage 47' TE  Visit#: 4 of 12  Re-eval: 04/30/14    Authorization:    Authorization Time Period: medicare  Authorization Visit#: 4 of 10   Subjective: Symptoms/Limitations Symptoms: Pt reprts 2-3/10 muscle pain, still reports trouble sleeping at night, waking up 2-3 times a night Limitations: Sitting;Standing How long can you sit comfortably?: change position every 34mins How long can you stand comfortably?: 10mins Pain Assessment Pain Score: 3  Pain Location: Knee     Exercise/Treatments    Stretches Active Hamstring Stretch: 3 reps;30 seconds Passive Hamstring Stretch: Limitations Passive Hamstring Stretch Limitations: 3 way ham 30" x 2  Gastroc Stretch: 3 reps;30 seconds;Limitations Gastroc Stretch Limitations: slant board  Soleus Stretch: Limitations Soleus Stretch Limitations: lunge onto 14' box for improved flrexion x 30" Aerobic Stationary Bike: nustep L4 x 10:00   Standing Heel Raises: 10 reps;Limitations Heel Raises Limitations: no hands Terminal Knee Extension: 10 reps;20 reps Rocker Board: 2 minutes SLS with Vectors: 10" x 3 (for three) Other Standing Knee Exercises: Wall stands with ball behaind L knee hold 2" x12   Modalities Modalities: Cryotherapy Manual Therapy Manual Therapy: Massage Massage: prone hang with manual fascial release Cryotherapy Number Minutes Cryotherapy: 10 Minutes Cryotherapy Location: Knee Type of Cryotherapy: Ice pack  Physical Therapy Assessment and Plan PT Assessment and Plan Clinical Impression Statement: Added wall stands with ball pushes and continued stretches and manual that promotes extension PT Plan: treatment to emphasis improving extension and decreasing edema.  Continue with PT POC    Goals    Problem  List Patient Active Problem List   Diagnosis Date Noted  . Edema 03/31/2014  . Encounter for therapeutic drug monitoring 12/23/2013  . Knee stiffness 02/06/2013  . Knee pain 02/06/2013  . Difficulty in walking 02/06/2013  . Acute blood loss anemia 01/17/2013  . Postop Hyponatremia 01/15/2013  . OA (osteoarthritis) of knee 01/14/2013  . Thrombocytopenia 05/26/2011  . Sleep apnea   . Degenerative joint disease   . Chronic anticoagulation 02/02/2011  . Hyperlipidemia 07/29/2010  . Atrial fibrillation 07/29/2010  . Hypertension 12/07/2007       GP    Luann Aspinwall, [place] 04/07/2014, 12:06 PM

## 2014-04-08 ENCOUNTER — Ambulatory Visit (HOSPITAL_COMMUNITY)
Admission: RE | Admit: 2014-04-08 | Discharge: 2014-04-08 | Disposition: A | Discharge status: Home / Self Care | Payer: Medicare Other | Source: Ambulatory Visit | Attending: Orthopedic Surgery | Admitting: Orthopedic Surgery

## 2014-04-08 DIAGNOSIS — R609 Edema, unspecified: Secondary | ICD-10-CM

## 2014-04-08 NOTE — Progress Notes (Signed)
Physical Therapy Treatment Patient Details  Name: Joshua [name] MRN: [id number] Date of Birth: [birth year]  Today's Date: 04/08/2014 Time: 8185-6314 PT Time Calculation (min): 82 min Charge: TE 9702-6378, Manual 1505-1518, Ice 1520-1530  Visit#: 5 of 12  Re-eval: 04/30/14 Assessment Diagnosis: Lt TKR Surgical Date: 03/10/14 Next MD Visit: Alusio6/25/2015 Prior Therapy: HH  Authorization:    Authorization Time Period: medicare  Authorization Visit#: 5 of 10   Subjective: Symptoms/Limitations Symptoms: Pt stated no real pain just muscle soreness 2/10.  Reports compliance with HEP and attending [employer]. Pain Assessment Currently in Pain?: Yes Pain Score: 2  Pain Location: Knee Pain Orientation: Left  Objective  Exercise/Treatments Stretches Active Hamstring Stretch: 3 reps;30 seconds;Limitations Active Hamstring Stretch Limitations: standing on 14in box Knee: Self-Stretch to increase Flexion: Limitations Knee: Self-Stretch Limitations: 10x 10" on 14 in box for ROM Gastroc Stretch: 3 reps;30 seconds;Limitations Gastroc Stretch Limitations: slant board  Aerobic Stationary Bike: nustep L4 x 8' Standing Forward Lunges: Both;10 reps;Limitations Forward Lunges Limitations: for ROM Gait Training: 5RT focus on equal stride length and heel to toe pattern Other Standing Knee Exercises: 3D hip excursion Supine Quad Sets: 10 reps Short Arc Quad Sets: 15 reps Knee Extension: PROM Knee Flexion: PROM  Modalities Modalities: Cryotherapy Manual Therapy Manual Therapy: Massage Massage: Prone knee hang with manual from gastroc to hamstrings for muscle lengthening to improve ROM; Contract relax for knee extension 5x 10" Cryotherapy Number Minutes Cryotherapy: 10 Minutes Cryotherapy Location: Knee Type of Cryotherapy: Ice pack  Physical Therapy Assessment and Plan PT Assessment and Plan Clinical Impression Statement: Session focus on improving AROM with mobility exercises,  stretches and manual techniques to reduce fascial restrictiosn to improve flexibilty and improve AROM.  Improved AROM with extension following manual with 3 degrees lacking extension. Ended session wtih ice for pain and edema control.  Pain free with improved gait mechanics at end of session.   PT Plan: treatment to emphasis improving extension and decreasing edema.  Continue with PT POC    Goals Home Exercise Program Pt/caregiver will Perform Home Exercise Program: For increased ROM PT Short Term Goals Time to Complete Short Term Goals: 2 weeks PT Short Term Goal 1: ROM 5-120 to allow normalized gt and allow pt to squat. PT Short Term Goal 1 - Progress: Progressing toward goal PT Short Term Goal 2: Pt to be sleeping the whole night through PT Long Term Goals Time to Complete Long Term Goals: 4 weeks PT Long Term Goal 1: Pt to be walking outside without assistive device PT Long Term Goal 2: Pt to be able to stand for 30 minutes to socialize  PT Long Term Goal 2 - Progress: Progressing toward goal Long Term Goal 3: Pt to be able to be on his feet for over an hour to be able to fish Long Term Goal 3 Progress: Progressing toward goal Long Term Goal 4: Pt to be able to go up steps in a reciprocal manner  Problem List Patient Active Problem List   Diagnosis Date Noted  . Edema 03/31/2014  . Encounter for therapeutic drug monitoring 12/23/2013  . Knee stiffness 02/06/2013  . Knee pain 02/06/2013  . Difficulty in walking 02/06/2013  . Acute blood loss anemia 01/17/2013  . Postop Hyponatremia 01/15/2013  . OA (osteoarthritis) of knee 01/14/2013  . Thrombocytopenia 05/26/2011  . Sleep apnea   . Degenerative joint disease   . Chronic anticoagulation 02/02/2011  . Hyperlipidemia 07/29/2010  . Atrial fibrillation 07/29/2010  . Hypertension 12/07/2007  PT - End of Session Activity Tolerance: Patient tolerated treatment well General Behavior During Therapy: [facility] for tasks  assessed/performed  GP    Aldona Lento 04/08/2014, 4:16 PM

## 2014-04-09 ENCOUNTER — Ambulatory Visit (HOSPITAL_COMMUNITY)
Admission: RE | Admit: 2014-04-09 | Discharge: 2014-04-09 | Disposition: A | Discharge status: Home / Self Care | Payer: Medicare Other | Source: Ambulatory Visit | Attending: Orthopedic Surgery | Admitting: Orthopedic Surgery

## 2014-04-09 NOTE — Progress Notes (Signed)
Physical Therapy Treatment Patient Details  Name: Joshua [name] MRN: [id number] Date of Birth: 1941-01-15  Today's Date: 04/09/2014 Time: 0930-1025 PT Time Calculation (min): 55 min TE [phone], MT 1005-1015, Ice 1015-1025 Visit#: 6 of 12  Re-eval: 04/30/14    Subjective: Symptoms/Limitations Symptoms: Pt reports increased muscle spasm and pain last night, and had to take both a muscle relaxer and pain medication to sleep.   Pain Assessment Currently in Pain?: Yes Pain Score: 2  (Took M. relaxer prior to PT.  ) Pain Location: Knee Pain Orientation: Left Pain Type: Surgical pain   Exercise/Treatments  Stretches Active Hamstring Stretch: 3 reps;30 seconds;Limitations Active Hamstring Stretch Limitations: standing on 14in box Knee: Self-Stretch Limitations: 10x 10" on 14 in box for ROM Gastroc Stretch: 3 reps;30 seconds;Limitations Gastroc Stretch Limitations: slant board  Aerobic Stationary Bike: NuStep L4 x8 minutes    Standing Forward Lunges: Both;10 reps SLS: 20" x3, with 1 finger as needed Rebounder: Semi Tandem stance, both, x10 throws Other Standing Knee Exercises: HS Curle, Lt, 1 1/2# 2x10 Other Standing Knee Exercises: Side steps, RTB, 15 feet both ways x3 Seated Long Arc Quad: Left;2 sets;20 reps (1 1/2#) Prone  Prone Knee Hang Limitations:  (1 minute x2, with manual between, ending 2' with 2 1/2#) Other Prone Exercises: manual quad stretch, 30" x3      Physical Therapy Assessment and Plan PT Assessment and Plan Clinical Impression Statement: Continued with exercise program with emphasis on full ROM with activities and proper mechanics.  Pt reports tightness and spasms to ITB/HS last night, and tightness today, which required him to take pain meds/muscle relaxer last night to sleep and a muscle relaxer this morning.  Initiated manual quad stetching exericse for increasing ROM and decreasing muscle tone around knee, with reports of stretching to leg.  Pt  tolerated exercises well, and required limited rest breaks.  Pt had difficulty with balance exercises, requirng close SBA/CGA and 1 finger assist during SLS.   Rehab Potential: Good     Problem List Patient Active Problem List   Diagnosis Date Noted  . Edema 03/31/2014  . Encounter for therapeutic drug monitoring 12/23/2013  . Knee stiffness 02/06/2013  . Knee pain 02/06/2013  . Difficulty in walking 02/06/2013  . Acute blood loss anemia 01/17/2013  . Postop Hyponatremia 01/15/2013  . OA (osteoarthritis) of knee 01/14/2013  . Thrombocytopenia 05/26/2011  . Sleep apnea   . Degenerative joint disease   . Chronic anticoagulation 02/02/2011  . Hyperlipidemia 07/29/2010  . Atrial fibrillation 07/29/2010  . Hypertension 12/07/2007    PT - End of Session Activity Tolerance: Patient tolerated treatment well General Behavior During Therapy: [facility] for tasks assessed/performed   WOODWORTH,STEPHANIE 04/09/2014, 10:27 AM

## 2014-04-16 ENCOUNTER — Inpatient Hospital Stay (HOSPITAL_COMMUNITY): Admission: RE | Admit: 2014-04-16 | Payer: Medicare Other | Source: Ambulatory Visit | Admitting: Physical Therapy

## 2014-04-17 ENCOUNTER — Ambulatory Visit (HOSPITAL_COMMUNITY)
Admission: RE | Admit: 2014-04-17 | Discharge: 2014-04-17 | Disposition: A | Discharge status: Home / Self Care | Payer: Medicare Other | Source: Ambulatory Visit | Attending: Orthopedic Surgery | Admitting: Orthopedic Surgery

## 2014-04-17 DIAGNOSIS — Z96659 Presence of unspecified artificial knee joint: Secondary | ICD-10-CM | POA: Diagnosis not present

## 2014-04-17 NOTE — Progress Notes (Signed)
Physical Therapy Treatment Patient Details  Name: Joshua [name] MRN: [id number] Date of Birth: 1941/04/10  Today's Date: 04/17/2014 Time: 0930-1025 PT Time Calculation (min): 55 min  Visit#: 7 of 12  Re-eval: 04/30/14 Assessment Diagnosis: Lt TKR Surgical Date: 03/10/14 Next MD Visit: Alusio6/25/2015 Authorization Time Period: medicare  Authorization Visit#: 7 of 10  Charges:  therex [phone] (35'), manual 1005-1015 (10'), ice 1015-1024 (10')  Subjective: Symptoms/Limitations Symptoms: Pt states he is doing much better overall.  States he is sleeping through the night and is no longer taking muscle relaxers or pain meds. Pain Assessment Currently in Pain?: No/denies   Exercise/Treatments  Stretches Active Hamstring Stretch: 3 reps;30 seconds;Limitations Active Hamstring Stretch Limitations: standing on 14in box Knee: Self-Stretch to increase Flexion: Limitations Knee: Self-Stretch Limitations: 10x 10" on 14 in box for ROM Standing Heel Raises: Limitations Heel Raises Limitations: heelwalk 2RT Knee Flexion: 15 reps Forward Lunges: Both;10 reps Forward Lunges Limitations: for ROM SLS: 20" x3, with 1 finger as needed Rebounder: Semi Tandem stance, both, x10 throws  Modalities Modalities: Cryotherapy Manual Therapy Manual Therapy: Edema management Edema Management: retro massage with elevation to decrease edema; myofascial around scar to decrease adhesions Cryotherapy Number Minutes Cryotherapy: 10 Minutes Cryotherapy Location: Knee Type of Cryotherapy: Ice pack  Physical Therapy Assessment and Plan PT Assessment and Plan Clinical Impression Statement: Pt with improving ROM, function and overall reduction of pain and edema.   currently with AROM Lt knee 8-115 (was 15-105).  Pt is now able to ascend stairs reciprocally, is ambulating community distances without AD, has returned to fishing hobby without difficulty and is sleeping throughout the night.  Pt is  progressing well toward goals meeting 1/2 STG's and 3/4 LTG's. Pt is compliant with HEP. Rehab Potential: Good PT Plan: continue with current POC with focus on increasing extension (flexion is 115 degrees and strength is WNL).    Goals Home Exercise Program Pt/caregiver will Perform Home Exercise Program: For increased ROM PT Goal: Perform Home Exercise Program - Progress: Met PT Short Term Goals Time to Complete Short Term Goals: 2 weeks PT Short Term Goal 1: ROM 5-120 to allow normalized gt and allow pt to squat. PT Short Term Goal 1 - Progress: Progressing toward goal PT Short Term Goal 2: Pt to be sleeping the whole night through PT Short Term Goal 2 - Progress: Met PT Long Term Goals Time to Complete Long Term Goals: 4 weeks PT Long Term Goal 1: Pt to be walking outside without assistive device PT Long Term Goal 1 - Progress: Met PT Long Term Goal 2: Pt to be able to stand for 30 minutes to socialize  PT Long Term Goal 2 - Progress: Met Long Term Goal 3: Pt to be able to be on his feet for over an hour to be able to fish Long Term Goal 3 Progress: Met Long Term Goal 4: Pt to be able to go up steps in a reciprocal manner Long Term Goal 4 Progress: Partly met (unable to descend reciprocally)  Problem List Patient Active Problem List   Diagnosis Date Noted  . Edema 03/31/2014  . Encounter for therapeutic drug monitoring 12/23/2013  . Knee stiffness 02/06/2013  . Knee pain 02/06/2013  . Difficulty in walking 02/06/2013  . Acute blood loss anemia 01/17/2013  . Postop Hyponatremia 01/15/2013  . OA (osteoarthritis) of knee 01/14/2013  . Thrombocytopenia 05/26/2011  . Sleep apnea   . Degenerative joint disease   . Chronic anticoagulation 02/02/2011  . Hyperlipidemia  07/29/2010  . Atrial fibrillation 07/29/2010  . Hypertension 12/07/2007    PT - End of Session Activity Tolerance: Patient tolerated treatment well General Behavior During Therapy: WFL for tasks  assessed/performed   Teena Irani, PTA/CLT 04/17/2014, 10:23 AM

## 2014-04-18 ENCOUNTER — Ambulatory Visit (HOSPITAL_COMMUNITY)
Admission: RE | Admit: 2014-04-18 | Discharge: 2014-04-18 | Disposition: A | Discharge status: Home / Self Care | Payer: Medicare Other | Source: Ambulatory Visit | Attending: Orthopedic Surgery | Admitting: Orthopedic Surgery

## 2014-04-18 DIAGNOSIS — R609 Edema, unspecified: Secondary | ICD-10-CM

## 2014-04-18 NOTE — Progress Notes (Signed)
Physical Therapy Treatment Patient Details  Name: Joshua [name] MRN: [id number] Date of Birth: [birth year]  Today's Date: 04/18/2014 Time: 5427-0623 PT Time Calculation (min): 47 min    Charges: Manual 1430-1440, TherEx 7628-3151 Visit#: 8 of   12 Re-eval: 04/30/14 Assessment Diagnosis: Lt TKR Surgical Date: 03/10/14 Next MD Visit: Maureen Ralphs  Authorization:    Authorization Time Period: medicare  Authorization Visit#: 8 of 10   Subjective: Symptoms/Limitations Symptoms: Patient feels he is doing really well and will complete his therapy next session with discharge planning to continue plan his HEP Pain Assessment Currently in Pain?: No/denies  Precautions/Restrictions     Exercise/Treatments Stretches Active Hamstring Stretch Limitations: 3way to 8" box 10x 3second hold Hip Flexor Stretch: Limitations Hip Flexor Stretch Limitations: 8' 15x 3 seconds with therapist assitance for knee extension Aerobic Stationary Bike: NuStep L4 intervals x8 minutes Standing Forward Lunges: 10 reps Forward Lunges Limitations: for ROM 10x and to floor 10x Side Lunges: 10 reps;Limitations Side Lunges Limitations: to 6" boc Other Standing Knee Exercises: Squat reach matrix 3lb dumbbell 5x Other Standing Knee Exercises: 2" retro to airex with pillow anterior hip drives 76H 3 seconds   Manual Therapy Manual Therapy: Other (comment) Other Manual Therapy: thomas stretch and prone ely stretch with therapist assist and massage to decrease myofascial restictions.   Physical Therapy Assessment and Plan PT Assessment and Plan Clinical Impression Statement: patient displasy improvign though still limited knee ROM and limitd strength resultign in limited squat depth and inability to obtain normal stride length due to lack of Lt LE termiinal knee extension. Patient feels he should contineu with physical therapy due to limited knee mobility and need to improve knee miobility to improve gait to return to  prior level of function.  PT Plan: continue with current POC with focus on increasing knee extension and flexion (strength is WNL) to improve gait.     Goals PT Short Term Goals PT Short Term Goal 1: ROM 5-120 to allow normalized gt and allow pt to squat. PT Short Term Goal 1 - Progress: Progressing toward goal PT Short Term Goal 2: Pt to be sleeping the whole night through PT Short Term Goal 2 - Progress: Met PT Long Term Goals Long Term Goal 4: Pt to be able to go up steps in a reciprocal manner Long Term Goal 4 Progress: Progressing toward goal  Problem List Patient Active Problem List   Diagnosis Date Noted  . Edema 03/31/2014  . Encounter for therapeutic drug monitoring 12/23/2013  . Knee stiffness 02/06/2013  . Knee pain 02/06/2013  . Difficulty in walking 02/06/2013  . Acute blood loss anemia 01/17/2013  . Postop Hyponatremia 01/15/2013  . OA (osteoarthritis) of knee 01/14/2013  . Thrombocytopenia 05/26/2011  . Sleep apnea   . Degenerative joint disease   . Chronic anticoagulation 02/02/2011  . Hyperlipidemia 07/29/2010  . Atrial fibrillation 07/29/2010  . Hypertension 12/07/2007    PT - End of Session Activity Tolerance: Patient tolerated treatment well General Behavior During Therapy: [facility] for tasks assessed/performed  GP    DeWitt, Cash R 04/18/2014, 3:27 PM

## 2014-04-21 ENCOUNTER — Ambulatory Visit (HOSPITAL_COMMUNITY)
Admission: RE | Admit: 2014-04-21 | Discharge: 2014-04-21 | Disposition: A | Discharge status: Home / Self Care | Payer: Medicare Other | Source: Ambulatory Visit | Attending: Internal Medicine | Admitting: Internal Medicine

## 2014-04-21 ENCOUNTER — Ambulatory Visit (INDEPENDENT_AMBULATORY_CARE_PROVIDER_SITE_OTHER): Payer: Medicare Other | Admitting: *Deleted

## 2014-04-21 DIAGNOSIS — I4891 Unspecified atrial fibrillation: Secondary | ICD-10-CM | POA: Diagnosis not present

## 2014-04-21 DIAGNOSIS — Z5181 Encounter for therapeutic drug level monitoring: Secondary | ICD-10-CM | POA: Diagnosis not present

## 2014-04-21 DIAGNOSIS — Z7901 Long term (current) use of anticoagulants: Secondary | ICD-10-CM

## 2014-04-21 LAB — POCT INR: INR: 2.8

## 2014-04-21 NOTE — Progress Notes (Signed)
Physical Therapy Treatment Patient Details  Name: Joshua [name] MRN: [id number] Date of Birth: [birth year]  Today's Date: 04/21/2014 Time: 5400-8676 PT Time Calculation (min): 32 min TE 1950-9326, Ice 7124-5809 Visit#: 9 of 12  Re-eval: 04/30/14    Subjective: Symptoms/Limitations Symptoms: Pt reports only mild pain at 2/10 in his knee.  He reports when he woke up today it was the "best I've felt since surgery".   Pain Assessment Currently in Pain?: Yes Pain Score: 2  Pain Location: Knee Pain Orientation: Left Pain Type: Surgical pain   Exercise/Treatments Stretches Active Hamstring Stretch Limitations: 3way to 8" box 10x 3second hold Quad Stretch: 3 reps;30 seconds Hip Flexor Stretch: Limitations Hip Flexor Stretch Limitations: 8' Box, 20" x3 Gastroc Stretch: 3 reps;30 seconds;Limitations Press photographer Limitations: Slantboard Aerobic Stationary Bike: NuStep L5 x8 minutes Standing Forward Lunges: 10 reps Side Lunges: 15 reps Side Lunges Limitations: 6" box Functional Squat: 10 reps;10 seconds SLS: 20" x3, with 1 finger as needed Other Standing Knee Exercises: Side Stepping, GTB, 20 feet x3 Prone  Prone Knee Hang Limitations: 2', 2#,  x2      Physical Therapy Assessment and Plan PT Assessment and Plan Clinical Impression Statement: Pt reports he was feeling good today, "best I've felt" since surgery.  Only mild complaints of pain in knee reports, though some stagnant fluid noted in ankle with manual quad stretch.  No complaints of pain with strengthening exercises, though with ROM stretching (particularly quad stretch and prone hangs) pt reports discomfort in knee.   Pt will benefit from skilled therapeutic intervention in order to improve on the following deficits: Decreased range of motion;Decreased balance;Increased edema;Pain Rehab Potential: Good PT Frequency: Min 3X/week PT Duration: 4 weeks PT Treatment/Interventions: Gait training;Stair  training;Therapeutic exercise;Manual techniques;Patient/family education PT Plan: continue with current POC with focus on increasing knee extension and flexion (strength is WNL) to improve gait.      Problem List Patient Active Problem List   Diagnosis Date Noted  . Edema 03/31/2014  . Encounter for therapeutic drug monitoring 12/23/2013  . Knee stiffness 02/06/2013  . Knee pain 02/06/2013  . Difficulty in walking 02/06/2013  . Acute blood loss anemia 01/17/2013  . Postop Hyponatremia 01/15/2013  . OA (osteoarthritis) of knee 01/14/2013  . Thrombocytopenia 05/26/2011  . Sleep apnea   . Degenerative joint disease   . Chronic anticoagulation 02/02/2011  . Hyperlipidemia 07/29/2010  . Atrial fibrillation 07/29/2010  . Hypertension 12/07/2007    PT - End of Session Activity Tolerance: Patient tolerated treatment well General Behavior During Therapy: [facility] for tasks assessed/performed   WOODWORTH,STEPHANIE 04/21/2014, 2:30 PM

## 2014-04-28 ENCOUNTER — Ambulatory Visit (HOSPITAL_COMMUNITY)
Admission: RE | Admit: 2014-04-28 | Discharge: 2014-04-28 | Disposition: A | Discharge status: Home / Self Care | Payer: Medicare Other | Source: Ambulatory Visit | Attending: Orthopedic Surgery | Admitting: Orthopedic Surgery

## 2014-04-28 DIAGNOSIS — I10 Essential (primary) hypertension: Secondary | ICD-10-CM | POA: Insufficient documentation

## 2014-04-28 DIAGNOSIS — R262 Difficulty in walking, not elsewhere classified: Secondary | ICD-10-CM | POA: Insufficient documentation

## 2014-04-28 DIAGNOSIS — M25569 Pain in unspecified knee: Secondary | ICD-10-CM | POA: Diagnosis not present

## 2014-04-28 DIAGNOSIS — IMO0001 Reserved for inherently not codable concepts without codable children: Secondary | ICD-10-CM | POA: Insufficient documentation

## 2014-04-28 DIAGNOSIS — M7989 Other specified soft tissue disorders: Secondary | ICD-10-CM | POA: Diagnosis not present

## 2014-04-28 DIAGNOSIS — R609 Edema, unspecified: Secondary | ICD-10-CM

## 2014-04-28 DIAGNOSIS — M25669 Stiffness of unspecified knee, not elsewhere classified: Secondary | ICD-10-CM | POA: Insufficient documentation

## 2014-04-28 NOTE — Progress Notes (Signed)
Physical Therapy Treatment Patient Details  Name: Joshua [name] MRN: [id number] Date of Birth: [birth year]  Today's Date: 04/28/2014 Time: 1696-7893 PT Time Calculation (min): 11 min Charge: TE 1520-1605, Ice 8101-7510   Visit#: 10 of 12  Re-eval: 04/30/14 Assessment Diagnosis: Lt TKR Surgical Date: 03/10/14 Next MD Visit: Alusio Prior Therapy: HH  Authorization:    Authorization Time Period: medicare  Authorization Visit#: 9 of 10   Subjective: Symptoms/Limitations Symptoms: Lt knee feels really stiff, minimal pain 2/10.  Pt stated most functional difficulty descending stairs.  Has been prone knee hanging twice a day for 15 minutes. Pain Assessment Currently in Pain?: Yes Pain Score: 2  Pain Location: Knee Pain Orientation: Left  Objective:   Exercise/Treatments Stretches Active Hamstring Stretch: 3 reps;30 seconds;Limitations Active Hamstring Stretch Limitations: 3way to 14" box 3x 30" hold Quad Stretch: 3 reps;30 seconds Hip Flexor Stretch: Limitations Hip Flexor Stretch Limitations: 8' Box, 20" x3 Knee: Self-Stretch to increase Flexion: Limitations Knee: Self-Stretch Limitations: 10x 10" on 14 in box for ROM Gastroc Stretch: 3 reps;30 seconds;Limitations Gastroc Stretch Limitations: Slantboard Aerobic Stationary Bike: Reciprocal bike seat 9 x 8 min for ROM Standing Forward Lunges: 10 reps Forward Lunges Limitations: for ROM 10x and to floor 10x Side Lunges: 15 reps Side Lunges Limitations: 6" box Step Down: Left;10 reps;Hand Hold: 1;Step Height: 6" Functional Squat: 10 reps;10 seconds Supine Heel Slides: 5 reps Terminal Knee Extension: 10 reps   Modalities Modalities: Cryotherapy Cryotherapy Number Minutes Cryotherapy: 10 Minutes Cryotherapy Location: Knee Type of Cryotherapy: Ice pack  Physical Therapy Assessment and Plan PT Assessment and Plan Clinical Impression Statement: Session focus on ROM and functional strengthening following reports of  increased difficulty descending stairs.  Continued with stretches to improve ROM.  AROM 8-108.  Pt reported he does the prone knee hang for 15 minutes twice a day.  Pt with improved perceived LE functional  abiliies with FOTO. PT Plan: continue with current POC with focus on increasing knee extension and flexion (strength is WNL) to improve gait.   Re-assess 2 more sessions.      Goals PT Short Term Goals PT Short Term Goal 1: ROM 5-120 to allow normalized gt and allow pt to squat. PT Short Term Goal 1 - Progress: Progressing toward goal PT Long Term Goals Long Term Goal 4: Pt to be able to go up steps in a reciprocal manner Long Term Goal 4 Progress: Progressing toward goal  Problem List Patient Active Problem List   Diagnosis Date Noted  . Edema 03/31/2014  . Encounter for therapeutic drug monitoring 12/23/2013  . Knee stiffness 02/06/2013  . Knee pain 02/06/2013  . Difficulty in walking 02/06/2013  . Acute blood loss anemia 01/17/2013  . Postop Hyponatremia 01/15/2013  . OA (osteoarthritis) of knee 01/14/2013  . Thrombocytopenia 05/26/2011  . Sleep apnea   . Degenerative joint disease   . Chronic anticoagulation 02/02/2011  . Hyperlipidemia 07/29/2010  . Atrial fibrillation 07/29/2010  . Hypertension 12/07/2007    PT - End of Session Activity Tolerance: Patient tolerated treatment well General Behavior During Therapy: WFL for tasks assessed/performed  GP Functional Limitation: Mobility: Walking and moving around Mobility: Walking and Moving Around Current Status ([id number]): At least 20 percent but less than 40 percent impaired, limited or restricted Mobility: Walking and Moving Around Goal Status [phone]): At least 20 percent but less than 40 percent impaired, limited or restricted  Aldona Lento 04/28/2014, 4:16 PM

## 2014-04-30 ENCOUNTER — Ambulatory Visit (HOSPITAL_COMMUNITY)
Admission: RE | Admit: 2014-04-30 | Discharge: 2014-04-30 | Disposition: A | Discharge status: Home / Self Care | Payer: Medicare Other | Source: Ambulatory Visit | Attending: Physical Therapy | Admitting: Physical Therapy

## 2014-04-30 DIAGNOSIS — IMO0001 Reserved for inherently not codable concepts without codable children: Secondary | ICD-10-CM | POA: Diagnosis not present

## 2014-04-30 NOTE — Progress Notes (Signed)
Physical Therapy Treatment Patient Details  Name: Joshua [name] MRN: [id number] Date of Birth: 1941-06-23  Today's Date: 04/30/2014 Time: 4920-1007 PT Time Calculation (min): 53 min  Visit#: 11 of 12  Re-eval: 04/30/14 Authorization Time Period: medicare  Authorization Visit#: 11 of 20  Charges:  therex [phone] (42'), icepack 1515-1525 (10')  Subjective:  Pt states he has been fishing today.  States he is currently painfree.     Exercise/Treatments Stretches Active Hamstring Stretch: Limitations Active Hamstring Stretch Limitations: 3way to 14" box 10X3" hold Hip Flexor Stretch Limitations: 14' Box, 10" x3 Knee: Self-Stretch to increase Flexion: Limitations Knee: Self-Stretch Limitations: 10x 10" on 14 in box for ROM Gastroc Stretch: Limitations Gastroc Stretch Limitations: Slantboard 10X3 Aerobic Stationary Bike: nustep 10' Level 3 hills #3 LE only Standing Forward Lunges: 10 reps Forward Lunges Limitations: for ROM 10x and to floor 10x Side Lunges: 15 reps Side Lunges Limitations: 6" box Step Down: Left;10 reps;Hand Hold: 1;Step Height: 6" Functional Squat: 10 reps;10 seconds Other Standing Knee Exercises: Squat reach matrix 3lb dumbbell 5x   Modalities Modalities: Cryotherapy Cryotherapy Number Minutes Cryotherapy: 10 Minutes Cryotherapy Location: Knee Type of Cryotherapy: Ice pack  Physical Therapy Assessment and Plan PT Assessment and Plan Clinical Impression Statement: PT continues to focuson descending stairs at home.  AROM remains 8-108 degrees and reports compliance with HEP and prone knee hangs to increase extension.   PT able to recall 75% of therex independently.  Cues needed for postural alignment with therex.   PT Plan: Re-evaluate next visit.        Problem List Patient Active Problem List   Diagnosis Date Noted  . Edema 03/31/2014  . Encounter for therapeutic drug monitoring 12/23/2013  . Knee stiffness 02/06/2013  . Knee pain 02/06/2013   . Difficulty in walking 02/06/2013  . Acute blood loss anemia 01/17/2013  . Postop Hyponatremia 01/15/2013  . OA (osteoarthritis) of knee 01/14/2013  . Thrombocytopenia 05/26/2011  . Sleep apnea   . Degenerative joint disease   . Chronic anticoagulation 02/02/2011  . Hyperlipidemia 07/29/2010  . Atrial fibrillation 07/29/2010  . Hypertension 12/07/2007    PT - End of Session Activity Tolerance: Patient tolerated treatment well General Behavior During Therapy: WFL for tasks assessed/performed      Teena Irani, PTA/CLT 04/30/2014, 5:13 PM

## 2014-05-02 ENCOUNTER — Ambulatory Visit (HOSPITAL_COMMUNITY): Payer: Medicare Other | Admitting: Physical Therapy

## 2014-05-08 ENCOUNTER — Ambulatory Visit (HOSPITAL_COMMUNITY)
Admission: RE | Admit: 2014-05-08 | Discharge: 2014-05-08 | Disposition: A | Discharge status: Home / Self Care | Payer: Medicare Other | Source: Ambulatory Visit | Attending: Internal Medicine | Admitting: Internal Medicine

## 2014-05-08 DIAGNOSIS — IMO0001 Reserved for inherently not codable concepts without codable children: Secondary | ICD-10-CM | POA: Diagnosis not present

## 2014-05-08 NOTE — Progress Notes (Signed)
Physical Therapy Re-evaluation / Discharge  Patient Details  Name: Joshua [name] MRN: [id number] Date of Birth: 1940/10/28  Today's Date: 05/08/2014 Time: 5686-1683 PT Time Calculation (min): 37 min              Visit#: 12 of 12  Re-eval: 04/30/14 Assessment Diagnosis: Lt TKR Surgical Date: 03/10/14 Next MD Visit: Maureen Ralphs Authorization Time Period: medicare  Authorization Visit#: 12 of 20  Charges:  therex [phone] (15'), ROM testing [phone] (5'), self care [phone] (15')  Subjective Symptoms/Limitations Symptoms: Pt states he is very pleased with his progress and is ready for discharge.  Pt states he is going to the gym also, stretching and icing as needed.   Pain Assessment Currently in Pain?: No/denies    Assessment LLE AROM (degrees) Left Knee Extension: 10 (was 10 degrees) Left Knee Flexion: 120 (was 115 degrees) LLE Strength Left Hip Flexion: 5/5 Left Hip Extension: 5/5 Left Hip ABduction: 5/5 Left Hip ADduction: 5/5 Left Knee Flexion: 5/5 Left Knee Extension: 5/5 Left Ankle Dorsiflexion: 5/5  Exercise/Treatments Stretches Active Hamstring Stretch: Limitations Active Hamstring Stretch Limitations: 3way to 14" box 10X3" hold Aerobic Stationary Bike: nustep 10' Level 3 hills #3 LE only    Physical Therapy Assessment and Plan PT Assessment and Plan Clinical Impression Statement: Pt has completed 12 visits following Lt TKA surgery.  Pt has met all goals except ROM goal of 5-120 degrees (currently 10-120 degrees).  Pt encouraged to continue HEP and work on ROM and swelling.   Pt without any functional impairments and overall pleased with progress.   PT Plan: Recommend Discharge to HEP .    Goals Home Exercise Program Pt/caregiver will Perform Home Exercise Program: For increased ROM PT Goal: Perform Home Exercise Program - Progress: Met PT Short Term Goals PT Short Term Goal 1: ROM 5-120 to allow normalized gt and allow pt to squat. (ROM is 10-120) PT Short  Term Goal 1 - Progress: Partly met PT Short Term Goal 2: Pt to be sleeping the whole night through PT Short Term Goal 2 - Progress: Met PT Long Term Goals PT Long Term Goal 1: Pt to be walking outside without assistive device PT Long Term Goal 1 - Progress: Met PT Long Term Goal 2: Pt to be able to stand for 30 minutes to socialize  PT Long Term Goal 2 - Progress: Met Long Term Goal 3: Pt to be able to be on his feet for over an hour to be able to fish Long Term Goal 3 Progress: Met Long Term Goal 4: Pt to be able to go up steps in a reciprocal manner Long Term Goal 4 Progress: Met  Problem List Patient Active Problem List   Diagnosis Date Noted  . Edema 03/31/2014  . Encounter for therapeutic drug monitoring 12/23/2013  . Knee stiffness 02/06/2013  . Knee pain 02/06/2013  . Difficulty in walking 02/06/2013  . Acute blood loss anemia 01/17/2013  . Postop Hyponatremia 01/15/2013  . OA (osteoarthritis) of knee 01/14/2013  . Thrombocytopenia 05/26/2011  . Sleep apnea   . Degenerative joint disease   . Chronic anticoagulation 02/02/2011  . Hyperlipidemia 07/29/2010  . Atrial fibrillation 07/29/2010  . Hypertension 12/07/2007    PT - End of Session Activity Tolerance: Patient tolerated treatment well General Behavior During Therapy: WFL for tasks assessed/performed  GP Functional Assessment Tool Used: FOTO 83% functional status (was 56% at IE) Functional Limitation: Mobility: Walking and moving around Mobility: Walking and Moving Around Goal Status [phone]):  At least 20 percent but less than 40 percent impaired, limited or restricted Mobility: Walking and Moving Around Discharge Status [phone]): At least 20 percent but less than 40 percent impaired, limited or restricted  Teena Irani, PTA/CLT 05/08/2014, 10:03 AM

## 2014-05-15 ENCOUNTER — Ambulatory Visit (INDEPENDENT_AMBULATORY_CARE_PROVIDER_SITE_OTHER): Payer: Medicare Other | Admitting: *Deleted

## 2014-05-15 DIAGNOSIS — Z7901 Long term (current) use of anticoagulants: Secondary | ICD-10-CM | POA: Diagnosis not present

## 2014-05-15 DIAGNOSIS — I4891 Unspecified atrial fibrillation: Secondary | ICD-10-CM | POA: Diagnosis not present

## 2014-05-15 DIAGNOSIS — Z5181 Encounter for therapeutic drug level monitoring: Secondary | ICD-10-CM | POA: Diagnosis not present

## 2014-05-15 LAB — POCT INR: INR: 3.1

## 2014-05-30 DIAGNOSIS — I4891 Unspecified atrial fibrillation: Secondary | ICD-10-CM | POA: Diagnosis not present

## 2014-05-30 DIAGNOSIS — I10 Essential (primary) hypertension: Secondary | ICD-10-CM | POA: Diagnosis not present

## 2014-06-12 ENCOUNTER — Ambulatory Visit (INDEPENDENT_AMBULATORY_CARE_PROVIDER_SITE_OTHER): Payer: Medicare Other | Admitting: *Deleted

## 2014-06-12 DIAGNOSIS — I4891 Unspecified atrial fibrillation: Secondary | ICD-10-CM | POA: Diagnosis not present

## 2014-06-12 DIAGNOSIS — Z5181 Encounter for therapeutic drug level monitoring: Secondary | ICD-10-CM | POA: Diagnosis not present

## 2014-06-12 DIAGNOSIS — Z7901 Long term (current) use of anticoagulants: Secondary | ICD-10-CM

## 2014-06-12 LAB — POCT INR: INR: 3

## 2014-06-19 DIAGNOSIS — Z471 Aftercare following joint replacement surgery: Secondary | ICD-10-CM | POA: Diagnosis not present

## 2014-07-09 ENCOUNTER — Ambulatory Visit (INDEPENDENT_AMBULATORY_CARE_PROVIDER_SITE_OTHER): Payer: Medicare Other | Admitting: *Deleted

## 2014-07-09 DIAGNOSIS — Z5181 Encounter for therapeutic drug level monitoring: Secondary | ICD-10-CM

## 2014-07-09 DIAGNOSIS — Z7901 Long term (current) use of anticoagulants: Secondary | ICD-10-CM | POA: Diagnosis not present

## 2014-07-09 DIAGNOSIS — I4891 Unspecified atrial fibrillation: Secondary | ICD-10-CM

## 2014-07-09 LAB — POCT INR: INR: 2.8

## 2014-08-13 ENCOUNTER — Ambulatory Visit (INDEPENDENT_AMBULATORY_CARE_PROVIDER_SITE_OTHER): Payer: Medicare Other | Admitting: *Deleted

## 2014-08-13 DIAGNOSIS — I4891 Unspecified atrial fibrillation: Secondary | ICD-10-CM | POA: Diagnosis not present

## 2014-08-13 DIAGNOSIS — Z5181 Encounter for therapeutic drug level monitoring: Secondary | ICD-10-CM

## 2014-08-13 DIAGNOSIS — Z7901 Long term (current) use of anticoagulants: Secondary | ICD-10-CM | POA: Diagnosis not present

## 2014-08-13 LAB — POCT INR: INR: 3.5

## 2014-08-20 ENCOUNTER — Encounter: Payer: Medicare Other | Admitting: *Deleted

## 2014-08-26 NOTE — Addendum Note (Signed)
Encounter addended by: Leeroy Cha, PT on: 08/26/2014  4:52 PM<BR>     Documentation filed: Fast Note

## 2014-09-10 ENCOUNTER — Encounter: Payer: Medicare Other | Admitting: *Deleted

## 2014-09-22 ENCOUNTER — Encounter: Payer: Self-pay | Admitting: *Deleted

## 2014-09-22 ENCOUNTER — Ambulatory Visit (INDEPENDENT_AMBULATORY_CARE_PROVIDER_SITE_OTHER): Payer: Medicare Other | Admitting: *Deleted

## 2014-09-22 DIAGNOSIS — Z5181 Encounter for therapeutic drug level monitoring: Secondary | ICD-10-CM

## 2014-09-22 DIAGNOSIS — I4891 Unspecified atrial fibrillation: Secondary | ICD-10-CM

## 2014-09-22 DIAGNOSIS — Z7901 Long term (current) use of anticoagulants: Secondary | ICD-10-CM | POA: Diagnosis not present

## 2014-09-22 LAB — POCT INR: INR: 4.1

## 2014-10-06 ENCOUNTER — Ambulatory Visit (INDEPENDENT_AMBULATORY_CARE_PROVIDER_SITE_OTHER): Payer: Medicare Other | Admitting: *Deleted

## 2014-10-06 DIAGNOSIS — I4891 Unspecified atrial fibrillation: Secondary | ICD-10-CM | POA: Diagnosis not present

## 2014-10-06 DIAGNOSIS — Z7901 Long term (current) use of anticoagulants: Secondary | ICD-10-CM

## 2014-10-06 DIAGNOSIS — Z5181 Encounter for therapeutic drug level monitoring: Secondary | ICD-10-CM | POA: Diagnosis not present

## 2014-10-06 LAB — POCT INR: INR: 2.1

## 2014-10-06 NOTE — Progress Notes (Signed)
This encounter was created in error - please disregard.

## 2014-10-27 ENCOUNTER — Ambulatory Visit (INDEPENDENT_AMBULATORY_CARE_PROVIDER_SITE_OTHER): Payer: Medicare Other | Admitting: *Deleted

## 2014-10-27 DIAGNOSIS — I4891 Unspecified atrial fibrillation: Secondary | ICD-10-CM | POA: Diagnosis not present

## 2014-10-27 DIAGNOSIS — Z5181 Encounter for therapeutic drug level monitoring: Secondary | ICD-10-CM

## 2014-10-27 DIAGNOSIS — Z7901 Long term (current) use of anticoagulants: Secondary | ICD-10-CM | POA: Diagnosis not present

## 2014-10-27 LAB — POCT INR: INR: 2.4

## 2014-11-24 ENCOUNTER — Ambulatory Visit (INDEPENDENT_AMBULATORY_CARE_PROVIDER_SITE_OTHER): Payer: Medicare Other | Admitting: *Deleted

## 2014-11-24 DIAGNOSIS — Z5181 Encounter for therapeutic drug level monitoring: Secondary | ICD-10-CM | POA: Diagnosis not present

## 2014-11-24 DIAGNOSIS — I4891 Unspecified atrial fibrillation: Secondary | ICD-10-CM

## 2014-11-24 DIAGNOSIS — Z7901 Long term (current) use of anticoagulants: Secondary | ICD-10-CM

## 2014-11-24 DIAGNOSIS — I480 Paroxysmal atrial fibrillation: Secondary | ICD-10-CM

## 2014-11-26 DIAGNOSIS — E785 Hyperlipidemia, unspecified: Secondary | ICD-10-CM | POA: Diagnosis not present

## 2014-11-26 DIAGNOSIS — I4891 Unspecified atrial fibrillation: Secondary | ICD-10-CM | POA: Diagnosis not present

## 2014-11-26 DIAGNOSIS — I10 Essential (primary) hypertension: Secondary | ICD-10-CM | POA: Diagnosis not present

## 2014-11-26 DIAGNOSIS — Z79899 Other long term (current) drug therapy: Secondary | ICD-10-CM | POA: Diagnosis not present

## 2014-12-05 ENCOUNTER — Other Ambulatory Visit (HOSPITAL_COMMUNITY): Payer: Self-pay | Admitting: Internal Medicine

## 2014-12-05 DIAGNOSIS — R310 Gross hematuria: Secondary | ICD-10-CM | POA: Diagnosis not present

## 2014-12-05 DIAGNOSIS — I4891 Unspecified atrial fibrillation: Secondary | ICD-10-CM | POA: Diagnosis not present

## 2014-12-05 DIAGNOSIS — Z23 Encounter for immunization: Secondary | ICD-10-CM | POA: Diagnosis not present

## 2014-12-05 DIAGNOSIS — I10 Essential (primary) hypertension: Secondary | ICD-10-CM | POA: Diagnosis not present

## 2014-12-08 ENCOUNTER — Telehealth: Payer: Self-pay | Admitting: *Deleted

## 2014-12-08 MED ORDER — WARFARIN SODIUM 10 MG PO TABS: ORAL_TABLET | ORAL | Status: DC

## 2014-12-08 NOTE — Telephone Encounter (Signed)
Please call refill on Coumadin to [facility] [place] / tgs

## 2014-12-12 ENCOUNTER — Ambulatory Visit (HOSPITAL_COMMUNITY)
Admission: RE | Admit: 2014-12-12 | Discharge: 2014-12-12 | Disposition: A | Discharge status: Home / Self Care | Payer: Medicare Other | Source: Ambulatory Visit | Attending: Internal Medicine | Admitting: Internal Medicine

## 2014-12-12 ENCOUNTER — Encounter (HOSPITAL_COMMUNITY): Payer: Self-pay

## 2014-12-12 DIAGNOSIS — R590 Localized enlarged lymph nodes: Secondary | ICD-10-CM | POA: Diagnosis not present

## 2014-12-12 DIAGNOSIS — N2889 Other specified disorders of kidney and ureter: Secondary | ICD-10-CM | POA: Diagnosis not present

## 2014-12-12 DIAGNOSIS — K760 Fatty (change of) liver, not elsewhere classified: Secondary | ICD-10-CM | POA: Diagnosis not present

## 2014-12-12 DIAGNOSIS — N400 Enlarged prostate without lower urinary tract symptoms: Secondary | ICD-10-CM | POA: Diagnosis not present

## 2014-12-12 DIAGNOSIS — R310 Gross hematuria: Secondary | ICD-10-CM | POA: Diagnosis not present

## 2014-12-12 MED ORDER — IOHEXOL 300 MG/ML  SOLN
150.0000 mL | Freq: Once | INTRAMUSCULAR | Status: AC | PRN
  Administered 2014-12-12: 125 mL via INTRAVENOUS

## 2014-12-29 ENCOUNTER — Encounter: Payer: Self-pay | Admitting: Cardiology

## 2014-12-29 ENCOUNTER — Ambulatory Visit (INDEPENDENT_AMBULATORY_CARE_PROVIDER_SITE_OTHER): Payer: Medicare Other | Admitting: *Deleted

## 2014-12-29 ENCOUNTER — Ambulatory Visit (INDEPENDENT_AMBULATORY_CARE_PROVIDER_SITE_OTHER): Payer: Medicare Other | Admitting: Cardiology

## 2014-12-29 VITALS — BP 136/90 | HR 68 | Ht 70.0 in | Wt 238.0 lb

## 2014-12-29 DIAGNOSIS — I10 Essential (primary) hypertension: Secondary | ICD-10-CM | POA: Diagnosis not present

## 2014-12-29 DIAGNOSIS — Z7901 Long term (current) use of anticoagulants: Secondary | ICD-10-CM | POA: Diagnosis not present

## 2014-12-29 DIAGNOSIS — I482 Chronic atrial fibrillation: Secondary | ICD-10-CM

## 2014-12-29 DIAGNOSIS — I4891 Unspecified atrial fibrillation: Secondary | ICD-10-CM

## 2014-12-29 DIAGNOSIS — E785 Hyperlipidemia, unspecified: Secondary | ICD-10-CM

## 2014-12-29 DIAGNOSIS — Z5181 Encounter for therapeutic drug level monitoring: Secondary | ICD-10-CM

## 2014-12-29 DIAGNOSIS — I480 Paroxysmal atrial fibrillation: Secondary | ICD-10-CM

## 2014-12-29 LAB — POCT INR: INR: 3.4

## 2014-12-29 MED ORDER — DIGOXIN 125 MCG PO TABS: 0.1250 mg | ORAL_TABLET | Freq: Every day | ORAL | Status: DC

## 2014-12-29 NOTE — Patient Instructions (Signed)
Your physician wants you to follow-up in: 1 year with Dr Bryna Colander will receive a reminder letter in the mail two months in advance. If you don't receive a letter, please call our office to schedule the follow-up appointment.      DECREASE Digoxin to 0.125 mg daily     Thank you for choosing [facility] !

## 2014-12-29 NOTE — Progress Notes (Signed)
Clinical Summary Joshua [name] is a 74 y.o.male seen today for follow up of the following medical problems.   1. Afib - on chronic anticoag with coumadin. Reports some bleeding after taking ibuprofen with some bright red blood in urine that has since resolved - denies any recent palpitations.   2. Hyperlipidemia - was previously on prava, developed muscle aches. Stopped taking approx December, symptoms resolved. - reports he has tried multiple other statins with Dr Willey Blade, side effects with all.  - Feb 2016: TC 211 TG 389 HDL 31 LDL 102. Working to cut out fried foods and sweets to lower TGs  3. HTN - does not check at home  - compliant with meds   Past Medical History  Diagnosis Date  . Hypertension   . Hyperlipidemia   . Degenerative joint disease     Right knee  . Chronic anticoagulation   . Sleep apnea     Rx-CPAP  . Atrial fibrillation 06/2010  . Elevated cholesterol   . Shortness of breath     on exertion  . Nocturia   . BPH (benign prostatic hyperplasia)      No Known Allergies   Current Outpatient Prescriptions  Medication Sig Dispense Refill  . amLODipine (NORVASC) 5 MG tablet Take 5 mg by mouth daily before breakfast.     . atenolol (TENORMIN) 100 MG tablet Take 100 mg by mouth 2 (two) times daily.    . digoxin (LANOXIN) 0.25 MG tablet Take 0.25 mg by mouth daily before breakfast.    . docusate sodium 100 MG CAPS Take 100 mg by mouth 2 (two) times daily. 60 capsule 0  . lisinopril (PRINIVIL,ZESTRIL) 20 MG tablet Take 20 mg by mouth 2 (two) times daily.    Marland Kitchen warfarin (COUMADIN) 10 MG tablet Take 1 tablet daily except 1/2 tablet on Sundays and Thursdays or as directed 45 tablet 3   No current facility-administered medications for this visit.     Past Surgical History  Procedure Laterality Date  . Ventral hernia repair  03/2008, 02/2009  . Colonoscopy  03/10/2005  . 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 Total knee arthroplasty Left 03/10/2014    Procedure: LEFT TOTAL KNEE ARTHROPLASTY;  Surgeon: Gearlean Alf, MD;  Location: [facility];  Service: Orthopedics;  Laterality: Left;     No Known Allergies    Family History  Problem Relation Age of Onset  . Heart attack Father      Social History Mr. Mavis reports that he has been smoking Cigars.  He has never used smokeless tobacco. Mr. Grasse reports that he does not drink alcohol.   Review of Systems CONSTITUTIONAL: No weight loss, fever, chills, weakness or fatigue.  HEENT: Eyes: No visual loss, blurred vision, double vision or yellow sclerae.No hearing loss, sneezing, congestion, runny nose or sore throat.  SKIN: No rash or itching.  CARDIOVASCULAR: per HPI RESPIRATORY: No shortness of breath, cough or sputum.  GASTROINTESTINAL: No anorexia, nausea, vomiting or diarrhea. No abdominal pain or blood.  GENITOURINARY: No burning on urination, no polyuria NEUROLOGICAL: No headache, dizziness, syncope, paralysis, ataxia, numbness or tingling in the extremities. No change in bowel or bladder control.  MUSCULOSKELETAL: No muscle, back pain, joint pain or stiffness.  LYMPHATICS: No enlarged nodes. No history of splenectomy.  PSYCHIATRIC: No history of depression or anxiety.  ENDOCRINOLOGIC: No reports of sweating, cold or heat intolerance. No polyuria or polydipsia.  Marland Kitchen   Physical Examination p 68 bp 136/90 Wt 238 lbs BMI 34 Gen: resting comfortably, no acute distress HEENT: no scleral icterus, pupils equal round and reactive, no palptable cervical adenopathy,  CV: RRR, no m/r/g, no JVD Resp: Clear to auscultation bilaterally GI: abdomen is soft, non-tender, non-distended, normal bowel sounds, no hepatosplenomegaly MSK: extremities are warm, no edema.  Skin: warm, no rash Neuro:  no focal deficits Psych: appropriate  affect   Diagnostic Studies  Echo 09/2010: LVEF 55%, mild LVH, mild MR, mod LAE.     Assessment and Plan  1. Afib - no current symptoms - continue rate control, will decrease digoxin to 0.125mg  daily.  - continue coumadin  2. Hyperlipidemia - intolerant to statins, continue exercise and dietary modifcation  3. HTN - at goal, continue current meds   F/u 1 year     Arnoldo Lenis, M.D.

## 2014-12-30 ENCOUNTER — Encounter: Payer: Self-pay | Admitting: *Deleted

## 2015-01-26 ENCOUNTER — Ambulatory Visit (INDEPENDENT_AMBULATORY_CARE_PROVIDER_SITE_OTHER): Payer: Medicare Other | Admitting: *Deleted

## 2015-01-26 DIAGNOSIS — Z7901 Long term (current) use of anticoagulants: Secondary | ICD-10-CM

## 2015-01-26 DIAGNOSIS — I4891 Unspecified atrial fibrillation: Secondary | ICD-10-CM | POA: Diagnosis not present

## 2015-01-26 DIAGNOSIS — Z5181 Encounter for therapeutic drug level monitoring: Secondary | ICD-10-CM

## 2015-01-26 DIAGNOSIS — I480 Paroxysmal atrial fibrillation: Secondary | ICD-10-CM | POA: Diagnosis not present

## 2015-01-26 LAB — POCT INR: INR: 2.6

## 2015-02-23 ENCOUNTER — Ambulatory Visit (INDEPENDENT_AMBULATORY_CARE_PROVIDER_SITE_OTHER): Payer: Medicare Other | Admitting: *Deleted

## 2015-02-23 DIAGNOSIS — Z7901 Long term (current) use of anticoagulants: Secondary | ICD-10-CM

## 2015-02-23 DIAGNOSIS — Z5181 Encounter for therapeutic drug level monitoring: Secondary | ICD-10-CM | POA: Diagnosis not present

## 2015-02-23 DIAGNOSIS — I4891 Unspecified atrial fibrillation: Secondary | ICD-10-CM

## 2015-02-23 LAB — POCT INR: INR: 2.5

## 2015-02-24 ENCOUNTER — Encounter (INDEPENDENT_AMBULATORY_CARE_PROVIDER_SITE_OTHER): Payer: Self-pay | Admitting: *Deleted

## 2015-03-05 DIAGNOSIS — D225 Melanocytic nevi of trunk: Secondary | ICD-10-CM | POA: Diagnosis not present

## 2015-03-05 DIAGNOSIS — Z08 Encounter for follow-up examination after completed treatment for malignant neoplasm: Secondary | ICD-10-CM | POA: Diagnosis not present

## 2015-03-05 DIAGNOSIS — L814 Other melanin hyperpigmentation: Secondary | ICD-10-CM | POA: Diagnosis not present

## 2015-03-05 DIAGNOSIS — Z85820 Personal history of malignant melanoma of skin: Secondary | ICD-10-CM | POA: Diagnosis not present

## 2015-03-30 ENCOUNTER — Ambulatory Visit (INDEPENDENT_AMBULATORY_CARE_PROVIDER_SITE_OTHER): Payer: Medicare Other | Admitting: *Deleted

## 2015-03-30 DIAGNOSIS — Z7901 Long term (current) use of anticoagulants: Secondary | ICD-10-CM | POA: Diagnosis not present

## 2015-03-30 DIAGNOSIS — I4891 Unspecified atrial fibrillation: Secondary | ICD-10-CM | POA: Diagnosis not present

## 2015-03-30 DIAGNOSIS — Z5181 Encounter for therapeutic drug level monitoring: Secondary | ICD-10-CM

## 2015-03-30 LAB — POCT INR: INR: 3.2

## 2015-05-06 ENCOUNTER — Ambulatory Visit (INDEPENDENT_AMBULATORY_CARE_PROVIDER_SITE_OTHER): Payer: Medicare Other | Admitting: *Deleted

## 2015-05-06 DIAGNOSIS — Z5181 Encounter for therapeutic drug level monitoring: Secondary | ICD-10-CM

## 2015-05-06 DIAGNOSIS — I4891 Unspecified atrial fibrillation: Secondary | ICD-10-CM

## 2015-05-06 DIAGNOSIS — Z7901 Long term (current) use of anticoagulants: Secondary | ICD-10-CM

## 2015-05-06 LAB — POCT INR: INR: 3.1

## 2015-05-27 ENCOUNTER — Ambulatory Visit (INDEPENDENT_AMBULATORY_CARE_PROVIDER_SITE_OTHER): Payer: Medicare Other | Admitting: *Deleted

## 2015-05-27 DIAGNOSIS — Z5181 Encounter for therapeutic drug level monitoring: Secondary | ICD-10-CM

## 2015-05-27 DIAGNOSIS — I4891 Unspecified atrial fibrillation: Secondary | ICD-10-CM | POA: Diagnosis not present

## 2015-05-27 DIAGNOSIS — Z7901 Long term (current) use of anticoagulants: Secondary | ICD-10-CM

## 2015-05-27 LAB — POCT INR: INR: 2.3

## 2015-06-12 DIAGNOSIS — R310 Gross hematuria: Secondary | ICD-10-CM | POA: Diagnosis not present

## 2015-06-12 DIAGNOSIS — R319 Hematuria, unspecified: Secondary | ICD-10-CM | POA: Diagnosis not present

## 2015-06-12 DIAGNOSIS — Z6837 Body mass index (BMI) 37.0-37.9, adult: Secondary | ICD-10-CM | POA: Diagnosis not present

## 2015-06-12 DIAGNOSIS — I10 Essential (primary) hypertension: Secondary | ICD-10-CM | POA: Diagnosis not present

## 2015-06-12 DIAGNOSIS — G4733 Obstructive sleep apnea (adult) (pediatric): Secondary | ICD-10-CM | POA: Diagnosis not present

## 2015-07-06 ENCOUNTER — Ambulatory Visit (INDEPENDENT_AMBULATORY_CARE_PROVIDER_SITE_OTHER): Payer: Medicare Other | Admitting: *Deleted

## 2015-07-06 DIAGNOSIS — I4891 Unspecified atrial fibrillation: Secondary | ICD-10-CM

## 2015-07-06 DIAGNOSIS — Z7901 Long term (current) use of anticoagulants: Secondary | ICD-10-CM | POA: Diagnosis not present

## 2015-07-06 DIAGNOSIS — Z5181 Encounter for therapeutic drug level monitoring: Secondary | ICD-10-CM | POA: Diagnosis not present

## 2015-07-06 LAB — POCT INR: INR: 2.7

## 2015-07-13 ENCOUNTER — Other Ambulatory Visit: Payer: Self-pay | Admitting: Cardiology

## 2015-08-17 ENCOUNTER — Ambulatory Visit (INDEPENDENT_AMBULATORY_CARE_PROVIDER_SITE_OTHER): Payer: Medicare Other | Admitting: *Deleted

## 2015-08-17 DIAGNOSIS — I480 Paroxysmal atrial fibrillation: Secondary | ICD-10-CM | POA: Diagnosis not present

## 2015-08-17 DIAGNOSIS — Z5181 Encounter for therapeutic drug level monitoring: Secondary | ICD-10-CM

## 2015-08-17 DIAGNOSIS — I4891 Unspecified atrial fibrillation: Secondary | ICD-10-CM | POA: Diagnosis not present

## 2015-08-17 DIAGNOSIS — Z7901 Long term (current) use of anticoagulants: Secondary | ICD-10-CM

## 2015-08-17 LAB — POCT INR: INR: 2.4

## 2015-09-21 ENCOUNTER — Ambulatory Visit (INDEPENDENT_AMBULATORY_CARE_PROVIDER_SITE_OTHER): Payer: Medicare Other | Admitting: *Deleted

## 2015-09-21 DIAGNOSIS — Z85820 Personal history of malignant melanoma of skin: Secondary | ICD-10-CM | POA: Diagnosis not present

## 2015-09-21 DIAGNOSIS — L821 Other seborrheic keratosis: Secondary | ICD-10-CM | POA: Diagnosis not present

## 2015-09-21 DIAGNOSIS — D225 Melanocytic nevi of trunk: Secondary | ICD-10-CM | POA: Diagnosis not present

## 2015-09-21 DIAGNOSIS — Z5181 Encounter for therapeutic drug level monitoring: Secondary | ICD-10-CM | POA: Diagnosis not present

## 2015-09-21 DIAGNOSIS — I4891 Unspecified atrial fibrillation: Secondary | ICD-10-CM

## 2015-09-21 DIAGNOSIS — Z7901 Long term (current) use of anticoagulants: Secondary | ICD-10-CM | POA: Diagnosis not present

## 2015-09-21 DIAGNOSIS — L812 Freckles: Secondary | ICD-10-CM | POA: Diagnosis not present

## 2015-09-21 DIAGNOSIS — D1801 Hemangioma of skin and subcutaneous tissue: Secondary | ICD-10-CM | POA: Diagnosis not present

## 2015-09-21 LAB — POCT INR: INR: 2.9

## 2015-11-02 ENCOUNTER — Ambulatory Visit (INDEPENDENT_AMBULATORY_CARE_PROVIDER_SITE_OTHER): Payer: Medicare Other | Admitting: *Deleted

## 2015-11-02 DIAGNOSIS — I4891 Unspecified atrial fibrillation: Secondary | ICD-10-CM

## 2015-11-02 DIAGNOSIS — Z5181 Encounter for therapeutic drug level monitoring: Secondary | ICD-10-CM

## 2015-11-02 DIAGNOSIS — Z7901 Long term (current) use of anticoagulants: Secondary | ICD-10-CM

## 2015-11-02 LAB — POCT INR: INR: 2.9

## 2015-12-07 DIAGNOSIS — I482 Chronic atrial fibrillation: Secondary | ICD-10-CM | POA: Diagnosis not present

## 2015-12-07 DIAGNOSIS — I10 Essential (primary) hypertension: Secondary | ICD-10-CM | POA: Diagnosis not present

## 2015-12-07 DIAGNOSIS — Z79899 Other long term (current) drug therapy: Secondary | ICD-10-CM | POA: Diagnosis not present

## 2015-12-14 ENCOUNTER — Ambulatory Visit (INDEPENDENT_AMBULATORY_CARE_PROVIDER_SITE_OTHER): Payer: Medicare Other | Admitting: *Deleted

## 2015-12-14 DIAGNOSIS — Z7901 Long term (current) use of anticoagulants: Secondary | ICD-10-CM

## 2015-12-14 DIAGNOSIS — Z5181 Encounter for therapeutic drug level monitoring: Secondary | ICD-10-CM | POA: Diagnosis not present

## 2015-12-14 DIAGNOSIS — I4891 Unspecified atrial fibrillation: Secondary | ICD-10-CM | POA: Diagnosis not present

## 2015-12-14 LAB — POCT INR: INR: 2.6

## 2015-12-18 DIAGNOSIS — I482 Chronic atrial fibrillation: Secondary | ICD-10-CM | POA: Diagnosis not present

## 2015-12-18 DIAGNOSIS — Z6833 Body mass index (BMI) 33.0-33.9, adult: Secondary | ICD-10-CM | POA: Diagnosis not present

## 2015-12-18 DIAGNOSIS — G4733 Obstructive sleep apnea (adult) (pediatric): Secondary | ICD-10-CM | POA: Diagnosis not present

## 2015-12-18 DIAGNOSIS — E785 Hyperlipidemia, unspecified: Secondary | ICD-10-CM | POA: Diagnosis not present

## 2015-12-21 ENCOUNTER — Ambulatory Visit: Payer: Medicare Other | Admitting: Cardiology

## 2015-12-21 ENCOUNTER — Encounter: Payer: Self-pay | Admitting: Cardiology

## 2015-12-21 ENCOUNTER — Ambulatory Visit (INDEPENDENT_AMBULATORY_CARE_PROVIDER_SITE_OTHER): Payer: Medicare Other | Admitting: Cardiology

## 2015-12-21 VITALS — BP 130/78 | HR 82 | Ht 71.0 in | Wt 246.0 lb

## 2015-12-21 DIAGNOSIS — I10 Essential (primary) hypertension: Secondary | ICD-10-CM | POA: Diagnosis not present

## 2015-12-21 DIAGNOSIS — I482 Chronic atrial fibrillation: Secondary | ICD-10-CM | POA: Diagnosis not present

## 2015-12-21 DIAGNOSIS — I4891 Unspecified atrial fibrillation: Secondary | ICD-10-CM | POA: Diagnosis not present

## 2015-12-21 DIAGNOSIS — E785 Hyperlipidemia, unspecified: Secondary | ICD-10-CM | POA: Diagnosis not present

## 2015-12-21 MED ORDER — APIXABAN 5 MG PO TABS: 5.0000 mg | ORAL_TABLET | Freq: Two times a day (BID) | ORAL | Status: DC

## 2015-12-21 MED ORDER — METOPROLOL TARTRATE 50 MG PO TABS: 75.0000 mg | ORAL_TABLET | Freq: Two times a day (BID) | ORAL | Status: DC

## 2015-12-21 NOTE — Patient Instructions (Addendum)
Your physician recommends that you schedule a follow-up appointment in: 1 week  START Lopressor 75 mg twice a day  STOP Atenolol,Digoxin   START Eliquis 5 mg twice a day   Thank you for choosing [facility] !

## 2015-12-21 NOTE — Progress Notes (Addendum)
Patient ID: Joshua [name], male   DOB: Apr 07, 1941, 75 y.o.   MRN: [id number]     Clinical Summary Joshua [name] is a 75 y.o.male seen today for follow up of the following medical problems.    1. Afib . - denies any palpitations. - digoxin price increased he reports, he stopped taking about 1 week ago.   - denies any bleeding troubles on coumadin.   2. Hyperlipidemia - was previously on prava, developed muscle aches. Stopped taking approx December, symptoms resolved. - reports he has tried multiple other statins with Dr Willey Blade, side effects with all.  - Feb 2016: TC 211 TG 389 HDL 31 LDL 102.  3. HTN - does not check at home  - compliant with meds Past Medical History  Diagnosis Date  . Hypertension   . Hyperlipidemia   . Degenerative joint disease     Right knee  . Chronic anticoagulation   . Sleep apnea     Rx-CPAP  . Atrial fibrillation 06/2010  . Elevated cholesterol   . Shortness of breath     on exertion  . Nocturia   . BPH (benign prostatic hyperplasia)      No Known Allergies   Current Outpatient Prescriptions  Medication Sig Dispense Refill  . amLODipine (NORVASC) 5 MG tablet Take 5 mg by mouth daily before breakfast.     . atenolol (TENORMIN) 100 MG tablet Take 100 mg by mouth 2 (two) times daily.    . digoxin (LANOXIN) 0.125 MG tablet Take 1 tablet (0.125 mg total) by mouth daily. 90 tablet 3  . lisinopril (PRINIVIL,ZESTRIL) 20 MG tablet Take 20 mg by mouth 2 (two) times daily.    Marland Kitchen warfarin (COUMADIN) 10 MG tablet Take 1 tablet daily except 1/2 tablet on Sundays, Tuesdays and Thursdays 45 tablet 3   No current facility-administered medications for this visit.     Past Surgical History  Procedure Laterality Date  . Ventral hernia repair  03/2008, 02/2009  . Colonoscopy  03/10/2005  . 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 Total knee arthroplasty Left 03/10/2014    Procedure: LEFT TOTAL KNEE ARTHROPLASTY;  Surgeon: Gearlean Alf, MD;  Location: [facility];  Service: Orthopedics;  Laterality: Left;     No Known Allergies    Family History  Problem Relation Age of Onset  . Heart attack Father      Social History Joshua [name] reports that he has been smoking Cigars.  He has never used smokeless tobacco. Joshua [name] reports that he does not drink alcohol.   Review of Systems CONSTITUTIONAL: No weight loss, fever, chills, weakness or fatigue.  HEENT: Eyes: No visual loss, blurred vision, double vision or yellow sclerae.No hearing loss, sneezing, congestion, runny nose or sore throat.  SKIN: No rash or itching.  CARDIOVASCULAR: per HPI RESPIRATORY: No shortness of breath, cough or sputum.  GASTROINTESTINAL: No anorexia, nausea, vomiting or diarrhea. No abdominal pain or blood.  GENITOURINARY: No burning on urination, no polyuria NEUROLOGICAL: No headache, dizziness, syncope, paralysis, ataxia, numbness or tingling in the extremities. No change in bowel or bladder control.  MUSCULOSKELETAL: No muscle, back pain, joint pain or stiffness.  LYMPHATICS: No enlarged nodes. No history of splenectomy.  PSYCHIATRIC: No history of depression or anxiety.  ENDOCRINOLOGIC: No reports of sweating, cold or heat intolerance. No polyuria or polydipsia.  Marland Kitchen  Physical Examination Filed Vitals:   12/21/15 1346  BP: 130/78  Pulse: 82   Filed Vitals:   12/21/15 1346  Height: 5\' 11"  (1.803 m)  Weight: 246 lb (111.585 kg)    Gen: resting comfortably, no acute distress HEENT: no scleral icterus, pupils equal round and reactive, no palptable cervical adenopathy,  CV: irreg, no m/r/g, no jvd Resp: Clear to auscultation bilaterally GI: abdomen is soft, non-tender, non-distended, normal bowel sounds, no hepatosplenomegaly MSK: extremities are warm, no edema.  Skin: warm, no rash Neuro:  no focal  deficits Psych: appropriate affect   Diagnostic Studies Echo 09/2010: LVEF 55%, mild LVH, mild MR, mod LAE.      Assessment and Plan   1. Afib - ekg in clinic shows afib with RVR. He is asymptomatic - he stopped digoxin due to cost increase. I am ok with this, we will change his atenolol to lopressor 75mg  bid to see if we can rate control him with one agent.  - he is interested in changing to eliquis depending on cost, we will run through insurance and get more information for him.   2. Hyperlipidemia - intolerant to statins, continue exercise and dietary modifcation  3. HTN - at goal, we will continue current meds   F/u 1 week to f/u rates. If remain elevated titrate up beta blocker, consider low dose dilt.    Arnoldo Lenis, M.D.

## 2015-12-23 NOTE — Addendum Note (Signed)
Addended by: Carlyle Dolly F on: 12/23/2015 08:55 AM   Modules accepted: Level of Service

## 2016-01-01 ENCOUNTER — Encounter: Payer: Self-pay | Admitting: Adult Health

## 2016-01-01 ENCOUNTER — Ambulatory Visit (INDEPENDENT_AMBULATORY_CARE_PROVIDER_SITE_OTHER): Payer: Medicare Other | Admitting: Adult Health

## 2016-01-01 VITALS — BP 106/72 | HR 103 | Wt 242.0 lb

## 2016-01-01 DIAGNOSIS — I482 Chronic atrial fibrillation: Secondary | ICD-10-CM | POA: Diagnosis not present

## 2016-01-01 DIAGNOSIS — I952 Hypotension due to drugs: Secondary | ICD-10-CM

## 2016-01-01 DIAGNOSIS — I4821 Permanent atrial fibrillation: Secondary | ICD-10-CM

## 2016-01-01 MED ORDER — ATENOLOL 100 MG PO TABS: 100.0000 mg | ORAL_TABLET | Freq: Two times a day (BID) | ORAL | Status: DC

## 2016-01-01 NOTE — Progress Notes (Deleted)
Name: Joshua [name]    DOB: 1941/03/05  Age: 75 y.o.  MR#: [id number]       PCP:  Asencion Noble, MD      Insurance: Payor: MEDICARE / Plan: MEDICARE PART A AND B / Product Type: *No Product type* /   CC:   No chief complaint on file.   VS Filed Vitals:   01/01/16 1535  BP: 106/72  Pulse: 103  Weight: 242 lb (109.77 kg)  SpO2: 96%    Weights Current Weight  01/01/16 242 lb (109.77 kg)  12/21/15 246 lb (111.585 kg)  12/29/14 238 lb (107.956 kg)    Blood Pressure  BP Readings from Last 3 Encounters:  01/01/16 106/72  12/21/15 130/78  12/29/14 136/90     Admit date:  (Not on file) Last encounter with RMR:  Visit date not found   Allergy Review of patient's allergies indicates no known allergies.  Current Outpatient Prescriptions  Medication Sig Dispense Refill  . amLODipine (NORVASC) 5 MG tablet Take 5 mg by mouth daily before breakfast.     . lisinopril (PRINIVIL,ZESTRIL) 20 MG tablet Take 20 mg by mouth 2 (two) times daily.    . metoprolol (LOPRESSOR) 50 MG tablet Take 1.5 tablets (75 mg total) by mouth 2 (two) times daily. 270 tablet 3  . Omega-3 Fatty Acids (FISH OIL) 1000 MG CAPS Take 2 capsules by mouth daily.    Marland Kitchen warfarin (COUMADIN) 10 MG tablet Take 1 tablet daily except 1/2 tablet on Sundays, Tuesdays and Thursdays 45 tablet 3   No current facility-administered medications for this visit.    Discontinued Meds:    Medications Discontinued During This Encounter  Medication Reason  . apixaban (ELIQUIS) 5 MG TABS tablet Error    Patient Active Problem List   Diagnosis Date Noted  . Edema 03/31/2014  . Encounter for therapeutic drug monitoring 12/23/2013  . Knee stiffness 02/06/2013  . Knee pain 02/06/2013  . Difficulty in walking(719.7) 02/06/2013  . Acute blood loss anemia 01/17/2013  . Postop Hyponatremia 01/15/2013  . OA (osteoarthritis) of knee 01/14/2013  . Thrombocytopenia ([place]) 05/26/2011  . Sleep apnea   . Degenerative joint disease   . Chronic  anticoagulation 02/02/2011  . Hyperlipidemia 07/29/2010  . Atrial fibrillation ([place]) 07/29/2010  . Hypertension 12/07/2007    LABS    Component Value Date/Time   NA 141 03/12/2014 0432   NA 140 03/11/2014 0424   NA 141 02/28/2014 1330   K 4.3 03/12/2014 0432   K 4.1 03/11/2014 0424   K 4.2 02/28/2014 1330   CL 106 03/12/2014 0432   CL 105 03/11/2014 0424   CL 104 02/28/2014 1330   CO2 24 03/12/2014 0432   CO2 26 03/11/2014 0424   CO2 24 02/28/2014 1330   GLUCOSE 134* 03/12/2014 0432   GLUCOSE 167* 03/11/2014 0424   GLUCOSE 90 02/28/2014 1330   BUN 15 03/12/2014 0432   BUN 14 03/11/2014 0424   BUN 13 02/28/2014 1330   CREATININE 0.83 03/12/2014 0432   CREATININE 0.93 03/11/2014 0424   CREATININE 0.92 02/28/2014 1330   CREATININE 1.18 05/25/2011 1556   CALCIUM 9.0 03/12/2014 0432   CALCIUM 8.8 03/11/2014 0424   CALCIUM 9.0 02/28/2014 1330   GFRNONAA 86* 03/12/2014 0432   GFRNONAA 83* 03/11/2014 0424   GFRNONAA 83* 02/28/2014 1330   GFRAA >90 03/12/2014 0432   GFRAA >90 03/11/2014 0424   GFRAA >90 02/28/2014 1330   CMP     Component Value Date/Time  NA 141 03/12/2014 0432   K 4.3 03/12/2014 0432   CL 106 03/12/2014 0432   CO2 24 03/12/2014 0432   GLUCOSE 134* 03/12/2014 0432   BUN 15 03/12/2014 0432   CREATININE 0.83 03/12/2014 0432   CREATININE 1.18 05/25/2011 1556   CALCIUM 9.0 03/12/2014 0432   PROT 7.1 02/28/2014 1330   ALBUMIN 3.8 02/28/2014 1330   AST 26 02/28/2014 1330   ALT 18 02/28/2014 1330   ALKPHOS 53 02/28/2014 1330   BILITOT 0.8 02/28/2014 1330   GFRNONAA 86* 03/12/2014 0432   GFRAA >90 03/12/2014 0432       Component Value Date/Time   WBC 9.5 03/13/2014 0442   WBC 14.0* 03/12/2014 0432   WBC 13.2* 03/11/2014 0424   HGB 13.5 03/13/2014 0442   HGB 13.1 03/12/2014 0432   HGB 13.3 03/11/2014 0424   HCT 40.7 03/13/2014 0442   HCT 38.3* 03/12/2014 0432   HCT 37.7* 03/11/2014 0424   MCV 94.4 03/13/2014 0442   MCV 93.0 03/12/2014 0432    MCV 91.3 03/11/2014 0424    Lipid Panel     Component Value Date/Time   CHOL 187 11/28/2012 0830   TRIG 286* 11/28/2012 0830   HDL 30* 11/28/2012 0830   CHOLHDL 6.2 11/28/2012 0830   VLDL 57* 11/28/2012 0830   LDLCALC 100* 11/28/2012 0830    ABG No results found for: PHART, PCO2ART, PO2ART, HCO3, TCO2, ACIDBASEDEF, O2SAT   Lab Results  Component Value Date   TSH 2.024 07/16/2010   BNP (last 3 results) No results for input(s): BNP in the last 8760 hours.  ProBNP (last 3 results) No results for input(s): PROBNP in the last 8760 hours.  Cardiac Panel (last 3 results) No results for input(s): CKTOTAL, CKMB, TROPONINI, RELINDX in the last 72 hours.  Iron/TIBC/Ferritin/ %Sat No results found for: IRON, TIBC, FERRITIN, IRONPCTSAT   EKG Orders placed or performed in visit on 12/21/15  . EKG 12-Lead     Prior Assessment and Plan Problem List as of 01/01/2016      Cardiovascular and Mediastinum   Atrial fibrillation [facility])   Last Assessment & Plan 05/25/2011 Office Visit Written 05/25/2011  4:21 PM by Yehuda Savannah, MD    Heart rate control is excellent, and patient appears to be asymptomatic.  We will continue the strategy of heart rate control and anticoagulation.      Hypertension   Last Assessment & Plan 11/02/2012 Office Visit Written 11/02/2012  3:00 PM by Yehuda Savannah, MD    No elevation in the systolic or diastolic BP has been recorded for at least the past 4 years. Current medication will be continued.        Musculoskeletal and Integument   Degenerative joint disease   OA (osteoarthritis) of knee     Other   Hyperlipidemia   Last Assessment & Plan 11/02/2012 Office Visit Edited 11/02/2012  2:59 PM by Yehuda Savannah, MD    Patient is being treated with fish oil and low-dose niacin for elevated triglycerides and low HDL, but needs statin therapy as well. Atorvastatin will be added to medical regime and lipid profile repeated.      Chronic anticoagulation    Last Assessment & Plan 11/02/2012 Office Visit Written 11/02/2012  3:03 PM by Yehuda Savannah, MD    No evidence for occult GI blood loss. Stool samples for Hemoccult testing will be requested. INR as have been stable and therapeutic.      Sleep apnea  Thrombocytopenia (HCC)   Postop Hyponatremia   Acute blood loss anemia   Knee stiffness   Knee pain   Difficulty in walking(719.7)   Encounter for therapeutic drug monitoring   Edema       Imaging: No results found.

## 2016-01-01 NOTE — Patient Instructions (Signed)
Medication Instructions:  STOP METOPROLOL START ATENOLOL 100 MG TWO TIMES DAILY  Labwork: NONE  Testing/Procedures: NONE  Follow-Up: Your physician recommends that you schedule a follow-up appointment in: [place] DR. BRANCH    Any Other Special Instructions Will Be Listed Below (If Applicable).  ON THE April 4TH VISIT WITH [name], R.N. - HAVE A BLOOD PRESSURE CHECK     If you need a refill on your cardiac medications before your next appointment, please call your pharmacy.

## 2016-01-01 NOTE — Progress Notes (Signed)
Cardiology Office Note   Date:  01/01/2016   ID:  Joshua [name], DOB 03-Jun-1941, MRN [id number]  PCP:  Asencion Noble, MD  Cardiologist:  Cloria Spring, NP   Chief Complaint  Patient presents with  . Atrial Fibrillation      History of Present Illness: Joshua [name] is a 75 y.o. male who presents for ongoing assessment and management of atrial fibrillation, hyperlipidemia, and hypertension.July seen by Dr. Harl Bowie on 12/21/2015.  He continued on Coumadin therapy, but had stopped taking digoxin, as if it had become too expensive for him. EKG in the office revealed A. Fib with RVR, his atenolol was changed to Lopressor 75 mg twice a day.  He considered changing Coumadin 2 ELIQUIS, but wanted to run it through his insurance prior to changing.he is here for followup to evaluate heart rate control, and medication changes.  He comes today stating that he wishes to continue on Coumadin as his cost of the knoblike [place] inhalation agent is over $300.  He states he simply cannot afford that.  The patient's blood pressure and heart rate are worsened today and he is feeling worse as well, on the medication changes.  Past Medical History  Diagnosis Date  . Hypertension   . Hyperlipidemia   . Degenerative joint disease     Right knee  . Chronic anticoagulation   . Sleep apnea     Rx-CPAP  . Atrial fibrillation ([place]) 06/2010  . Elevated cholesterol   . Shortness of breath     on exertion  . Nocturia   . BPH (benign prostatic hyperplasia)     Past Surgical History  Procedure Laterality Date  . Ventral hernia repair  03/2008, 02/2009  . Colonoscopy  03/10/2005  . 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 Total knee arthroplasty Left 03/10/2014    Procedure: LEFT TOTAL KNEE ARTHROPLASTY;  Surgeon: Gearlean Alf, MD;  Location: [facility];  Service:  Orthopedics;  Laterality: Left;     Current Outpatient Prescriptions  Medication Sig Dispense Refill  . amLODipine (NORVASC) 5 MG tablet Take 5 mg by mouth daily before breakfast.     . lisinopril (PRINIVIL,ZESTRIL) 20 MG tablet Take 20 mg by mouth 2 (two) times daily.    . Omega-3 Fatty Acids (FISH OIL) 1000 MG CAPS Take 2 capsules by mouth daily.    Marland Kitchen warfarin (COUMADIN) 10 MG tablet Take 1 tablet daily except 1/2 tablet on Sundays, Tuesdays and Thursdays 45 tablet 3  . atenolol (TENORMIN) 100 MG tablet Take 1 tablet (100 mg total) by mouth 2 (two) times daily. 180 tablet 3   No current facility-administered medications for this visit.    Allergies:   Review of patient's allergies indicates no known allergies.    Social History:  The patient  reports that he has been smoking Cigars.  He has never used smokeless tobacco. He reports that he does not drink alcohol or use illicit drugs.   Family History:  The patient's family history includes Heart attack in his father.    ROS: All other systems are reviewed and negative. Unless otherwise mentioned in H&P    PHYSICAL EXAM: VS:  BP 106/72 mmHg  Pulse 103  Wt 242 lb (109.77 kg)  SpO2 96% , BMI Body mass index is 33.77 kg/(m^2). GEN: Well nourished, well developed, in no acute distress HEENT:  normal Neck: no JVD, carotid bruits, or masses Cardiac: IRRR; tachycardic,no murmurs, rubs, or gallops,no edema  Respiratory:  clear to auscultation bilaterally, normal work of breathing GI: soft, nontender, nondistended, + BS MS: no deformity or atrophy Skin: warm and dry, no rash Neuro:  Strength and sensation are intact Psych: euthymic mood, full affect   Recent Labs: No results found for requested labs within last 365 days.    Lipid Panel    Component Value Date/Time   CHOL 187 11/28/2012 0830   TRIG 286* 11/28/2012 0830   HDL 30* 11/28/2012 0830   CHOLHDL 6.2 11/28/2012 0830   VLDL 57* 11/28/2012 0830   LDLCALC 100*  11/28/2012 0830      Wt Readings from Last 3 Encounters:  01/01/16 242 lb (109.77 kg)  12/21/15 246 lb (111.585 kg)  12/29/14 238 lb (107.956 kg)     ASSESSMENT AND PLAN:  1.  Atrial fibrillation:heart rate is elevated today.  In comparison to the office visit last week.  Heart rate was normal at that time.  He had to stop taking digoxin, prior to the office visit. He felt much better on the prior medication regimen.  Since his blood pressure is low and his heart rate, is I am going to return him to his original medication regimen of atenolol 100 mg twice a day, stop metoprolol 75 mg twice a day.  He will continued on Coumadin therapy as he has a followup appointment on 01/25/2016.  I will have a follow-up blood pressure completed.  At that time, he will see Dr. Harl Bowie in one month.  2. Hypotension: Will not make any further recommendations on medication changes with the exception of those listed above.   Current medicines are reviewed at length with the patient today.    Labs/ tests ordered today include:  No orders of the defined types were placed in this encounter.     Disposition:   FU with 1 month with Dr. Harl Bowie, 2 weeks in the Coumadin clinic.  Signed, Jory Sims, NP  01/01/2016 4:20 PM    [facility] [address] [place], [place] [address] Phone: [phone]; Fax: [phone]

## 2016-01-25 ENCOUNTER — Ambulatory Visit (INDEPENDENT_AMBULATORY_CARE_PROVIDER_SITE_OTHER): Payer: Medicare Other | Admitting: *Deleted

## 2016-01-25 DIAGNOSIS — Z7901 Long term (current) use of anticoagulants: Secondary | ICD-10-CM | POA: Diagnosis not present

## 2016-01-25 DIAGNOSIS — Z5181 Encounter for therapeutic drug level monitoring: Secondary | ICD-10-CM

## 2016-01-25 DIAGNOSIS — I4891 Unspecified atrial fibrillation: Secondary | ICD-10-CM

## 2016-01-25 LAB — POCT INR: INR: 2.3

## 2016-02-11 ENCOUNTER — Telehealth: Payer: Self-pay | Admitting: Cardiology

## 2016-02-11 NOTE — Telephone Encounter (Signed)
Apt 4/24 at 150 pm

## 2016-02-11 NOTE — Telephone Encounter (Signed)
Pt called stating he's been having SOB w/ exertion & sweating

## 2016-02-11 NOTE — Telephone Encounter (Signed)
SOB last week while hunting since last week,sweating a lot,apt made

## 2016-02-15 ENCOUNTER — Encounter: Payer: Self-pay | Admitting: Adult Health

## 2016-02-15 ENCOUNTER — Ambulatory Visit (INDEPENDENT_AMBULATORY_CARE_PROVIDER_SITE_OTHER): Payer: Medicare Other | Admitting: Adult Health

## 2016-02-15 VITALS — BP 128/78 | HR 101 | Ht 71.0 in | Wt 242.0 lb

## 2016-02-15 DIAGNOSIS — R0602 Shortness of breath: Secondary | ICD-10-CM

## 2016-02-15 DIAGNOSIS — D649 Anemia, unspecified: Secondary | ICD-10-CM

## 2016-02-15 DIAGNOSIS — Z79899 Other long term (current) drug therapy: Secondary | ICD-10-CM

## 2016-02-15 MED ORDER — LISINOPRIL 10 MG PO TABS: 10.0000 mg | ORAL_TABLET | Freq: Two times a day (BID) | ORAL | Status: DC

## 2016-02-15 MED ORDER — POTASSIUM CHLORIDE CRYS ER 20 MEQ PO TBCR: EXTENDED_RELEASE_TABLET | ORAL | Status: DC

## 2016-02-15 MED ORDER — FUROSEMIDE 20 MG PO TABS
ORAL_TABLET | ORAL | Status: DC
Start: 2016-02-15 — End: 2016-03-03

## 2016-02-15 MED ORDER — AMIODARONE HCL 200 MG PO TABS: 200.0000 mg | ORAL_TABLET | Freq: Every day | ORAL | Status: DC

## 2016-02-15 NOTE — Progress Notes (Addendum)
Cardiology Office Note   Date:  02/15/2016   ID:  WALDEMAR SCHWEPPE, DOB 03-May-1941, MRN [id number]  PCP:  Asencion Noble, MD  Cardiologist: Cloria Spring, NP   No chief complaint on file.     History of Present Illness: Joshua [name] is a 75 y.o. male who presents for assessment and management of atrial fibrillation, hyperlipidemia, and hypertension. The patient continued on Coumadin therapy. The last office visit in March of 2017 his atenolol was changed to metoprolol 75 mg twice a day.he was unable to afford metoprolol, and felt that he did better on atenolol. He was switched back to atenolol 100 mg twice a day. He was to have a followup appointment on 01/25/2016 for blood pressure evaluation.he did not come for an appointment. The patient called on 02/11/2016 complaining ofshortness of breath and diaphoresis.  And patient states that about a week ago he was out hunting and on his way back to begin have some shortness of breath. The shortness of breath discontinue his lower extremity edema. He states he can walk across the parking not into the building and be very short of breath. He denies chest pain dizziness. He has been having some more fatigued lately.  Past Medical History  Diagnosis Date  . Hypertension   . Hyperlipidemia   . Degenerative joint disease     Right knee  . Chronic anticoagulation   . Sleep apnea     Rx-CPAP  . Atrial fibrillation ([place]) 06/2010  . Elevated cholesterol   . Shortness of breath     on exertion  . Nocturia   . BPH (benign prostatic hyperplasia)     Past Surgical History  Procedure Laterality Date  . Ventral hernia repair  03/2008, 02/2009  . Colonoscopy  03/10/2005  . 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 Total knee arthroplasty Left 03/10/2014    Procedure: LEFT TOTAL KNEE ARTHROPLASTY;  Surgeon: Gearlean Alf, MD;  Location: [facility];  Service: Orthopedics;  Laterality: Left;     Current Outpatient Prescriptions  Medication Sig Dispense Refill  . amLODipine (NORVASC) 5 MG tablet Take 5 mg by mouth daily before breakfast.     . atenolol (TENORMIN) 100 MG tablet Take 1 tablet (100 mg total) by mouth 2 (two) times daily. 180 tablet 3  . lisinopril (PRINIVIL,ZESTRIL) 20 MG tablet Take 20 mg by mouth 2 (two) times daily.    . Omega-3 Fatty Acids (FISH OIL) 1000 MG CAPS Take 2 capsules by mouth daily.    Marland Kitchen warfarin (COUMADIN) 10 MG tablet Take 1 tablet daily except 1/2 tablet on Sundays, Tuesdays and Thursdays 45 tablet 3   No current facility-administered medications for this visit.    Allergies:   Review of patient's allergies indicates no known allergies.    Social History:  The patient  reports that he has been smoking Cigars.  He has never used smokeless tobacco. He reports that he does not drink alcohol or use illicit drugs.   Family History:  The patient's family history includes Heart attack in his father.    ROS: All other systems are reviewed and negative. Unless otherwise mentioned in H&P    PHYSICAL EXAM: VS:  There were no vitals taken for this visit. , BMI There is no weight on file to calculate BMI. GEN: Well nourished, well developed, in no  acute distress HEENT: normal Neck: no JVD, carotid bruits, or masses Cardiac: IRRR; tachycardic,no murmurs, rubs, or gallops,no edema  Respiratory:  Clear to auscultation bilaterally,diminished in the bases. GI: soft, nontender, mildly distended. MS: no deformity or atrophy2+ pretibial edema Skin: warm and dry, no rash Neuro:  Strength and sensation are intact Psych: euthymic mood, full affect   EKG:  The ekg ordered today demonstrates atrial fibrillation with RVR heart rate 136 beats per minute. Nonspecific ST depression.   Recent Labs: No results found for requested labs within last 365 days.    Lipid Panel     Component Value Date/Time   CHOL 187 11/28/2012 0830   TRIG 286* 11/28/2012 0830   HDL 30* 11/28/2012 0830   CHOLHDL 6.2 11/28/2012 0830   VLDL 57* 11/28/2012 0830   LDLCALC 100* 11/28/2012 0830      Wt Readings from Last 3 Encounters:  01/01/16 242 lb (109.77 kg)  12/21/15 246 lb (111.585 kg)  12/29/14 238 lb (107.956 kg)     ASSESSMENT AND PLAN:  1.  Atrial fibrillation with RVR: Likely part of the etiology for his dyspnea. He continues to take atenolol 100 mg twice a day. He did not tolerate metoprolol which is what I would have preferred. We'll begin him on amiodarone 200 mg twice a day. Would check a CBC, BNP to evaluate for anemia, and a BMET to evaluate for kidney function. He will be seen again in 3 days.Coumadin is followed in our office. We'll check to make sure he is not anemic he'll need echocardiogram when heart rate is better controlled.  2. Diastolic heart failure:a stress echocardiogram which was completed on 10/19/2010 revealing an EF of 55%. He'll need to have a repeat echocardiogram which will be ordered on next appointment if heart rate is better controlled., once we see him again for response to above medication. We'll start him on Lasix 20 mg daily with potassium replacement.I do not hear any wheezes or rhonchi in his lungs. Only lower extremity edema. I believe his dyspnea is related to rapid heart rhythm.I've advised him that he finds no relief within the next 24 hours, continues breating difficulties with lower extremity edema he is to report to the emergency room. He may have had some IV Lasix and IV amiodarone.  3.OSA: patient states he does wear CPAP nightly. That he's been having more trouble breathing and has to stack up to let us. He will have close followup in our office in 3 days  4. Hypertension: states he is on Lasix and rapid heart rhythm, I'm going to decrease his lisinopril to 10 mg twice a day from 20 mg twice a day to avoid hypotension in the setting  of addition of diuretics. .   Current medicines are reviewed at length with the patient today.    Labs/ tests ordered today include: CBC, BMET, BNP. No orders of the defined types were placed in this encounter.     Disposition:   FU with 3 days.  Signed, Jory Sims, NP  02/15/2016 7:05 AM    [facility] [address] [place], [place] [address] Phone: [phone]; Fax: [phone]

## 2016-02-15 NOTE — Patient Instructions (Addendum)
Medication Instructions:  Start amiodarone 200 mg 2 times daily  Start lasix 20 mg daily for next 3 days  Start potassium 20 mg daily for next 3 days DECREASE lisinopril to 10 mg two times daily     Labwork: Your physician recommends that you return for lab work in: TODAY CBC CMET BNP   Testing/Procedures: NONE   Follow-Up: Your physician recommends that you schedule a follow-up appointment in: Thursday to do repeat EKG    Any Other Special Instructions Will Be Listed Below (If Applicable).  IF YOUR BREATHING GETS WORSE, EVEN AFTER TAKING THE MEDICATION- PLEASE GO TO THE EMERGENCY ROOM    If you need a refill on your cardiac medications before your next appointment, please call your pharmacy.

## 2016-02-15 NOTE — Progress Notes (Signed)
Name: Joshua [name]    DOB: 1941/09/01  Age: 75 y.o.  MR#: [id number]       PCP:  Asencion Noble, MD      Insurance: Payor: MEDICARE / Plan: MEDICARE PART A AND B / Product Type: *No Product type* /   CC:   No chief complaint on file.   VS Filed Vitals:   02/15/16 1347  BP: 128/78  Pulse: 101  Height: 5\' 11"  (1.803 m)  Weight: 242 lb (109.77 kg)  SpO2: 96%    Weights Current Weight  02/15/16 242 lb (109.77 kg)  01/01/16 242 lb (109.77 kg)  12/21/15 246 lb (111.585 kg)    Blood Pressure  BP Readings from Last 3 Encounters:  02/15/16 128/78  01/01/16 106/72  12/21/15 130/78     Admit date:  (Not on file) Last encounter with RMR:  01/01/2016   Allergy Review of patient's allergies indicates no known allergies.  Current Outpatient Prescriptions  Medication Sig Dispense Refill  . amLODipine (NORVASC) 5 MG tablet Take 5 mg by mouth daily before breakfast.     . atenolol (TENORMIN) 100 MG tablet Take 1 tablet (100 mg total) by mouth 2 (two) times daily. 180 tablet 3  . lisinopril (PRINIVIL,ZESTRIL) 20 MG tablet Take 20 mg by mouth 2 (two) times daily.    . Omega-3 Fatty Acids (FISH OIL) 1000 MG CAPS Take 2 capsules by mouth daily.    Marland Kitchen warfarin (COUMADIN) 10 MG tablet Take 1 tablet daily except 1/2 tablet on Sundays, Tuesdays and Thursdays 45 tablet 3   No current facility-administered medications for this visit.    Discontinued Meds:   There are no discontinued medications.  Patient Active Problem List   Diagnosis Date Noted  . Edema 03/31/2014  . Encounter for therapeutic drug monitoring 12/23/2013  . Knee stiffness 02/06/2013  . Knee pain 02/06/2013  . Difficulty in walking(719.7) 02/06/2013  . Acute blood loss anemia 01/17/2013  . Postop Hyponatremia 01/15/2013  . OA (osteoarthritis) of knee 01/14/2013  . Thrombocytopenia ([place]) 05/26/2011  . Sleep apnea   . Degenerative joint disease   . Chronic anticoagulation 02/02/2011  . Hyperlipidemia 07/29/2010  . Atrial  fibrillation ([facility]) 07/29/2010  . Hypertension 12/07/2007    LABS    Component Value Date/Time   NA 141 03/12/2014 0432   NA 140 03/11/2014 0424   NA 141 02/28/2014 1330   K 4.3 03/12/2014 0432   K 4.1 03/11/2014 0424   K 4.2 02/28/2014 1330   CL 106 03/12/2014 0432   CL 105 03/11/2014 0424   CL 104 02/28/2014 1330   CO2 24 03/12/2014 0432   CO2 26 03/11/2014 0424   CO2 24 02/28/2014 1330   GLUCOSE 134* 03/12/2014 0432   GLUCOSE 167* 03/11/2014 0424   GLUCOSE 90 02/28/2014 1330   BUN 15 03/12/2014 0432   BUN 14 03/11/2014 0424   BUN 13 02/28/2014 1330   CREATININE 0.83 03/12/2014 0432   CREATININE 0.93 03/11/2014 0424   CREATININE 0.92 02/28/2014 1330   CREATININE 1.18 05/25/2011 1556   CALCIUM 9.0 03/12/2014 0432   CALCIUM 8.8 03/11/2014 0424   CALCIUM 9.0 02/28/2014 1330   GFRNONAA 86* 03/12/2014 0432   GFRNONAA 83* 03/11/2014 0424   GFRNONAA 83* 02/28/2014 1330   GFRAA >90 03/12/2014 0432   GFRAA >90 03/11/2014 0424   GFRAA >90 02/28/2014 1330   CMP     Component Value Date/Time   NA 141 03/12/2014 0432   K 4.3 03/12/2014 0432  CL 106 03/12/2014 0432   CO2 24 03/12/2014 0432   GLUCOSE 134* 03/12/2014 0432   BUN 15 03/12/2014 0432   CREATININE 0.83 03/12/2014 0432   CREATININE 1.18 05/25/2011 1556   CALCIUM 9.0 03/12/2014 0432   PROT 7.1 02/28/2014 1330   ALBUMIN 3.8 02/28/2014 1330   AST 26 02/28/2014 1330   ALT 18 02/28/2014 1330   ALKPHOS 53 02/28/2014 1330   BILITOT 0.8 02/28/2014 1330   GFRNONAA 86* 03/12/2014 0432   GFRAA >90 03/12/2014 0432       Component Value Date/Time   WBC 9.5 03/13/2014 0442   WBC 14.0* 03/12/2014 0432   WBC 13.2* 03/11/2014 0424   HGB 13.5 03/13/2014 0442   HGB 13.1 03/12/2014 0432   HGB 13.3 03/11/2014 0424   HCT 40.7 03/13/2014 0442   HCT 38.3* 03/12/2014 0432   HCT 37.7* 03/11/2014 0424   MCV 94.4 03/13/2014 0442   MCV 93.0 03/12/2014 0432   MCV 91.3 03/11/2014 0424    Lipid Panel     Component Value  Date/Time   CHOL 187 11/28/2012 0830   TRIG 286* 11/28/2012 0830   HDL 30* 11/28/2012 0830   CHOLHDL 6.2 11/28/2012 0830   VLDL 57* 11/28/2012 0830   LDLCALC 100* 11/28/2012 0830    ABG No results found for: PHART, PCO2ART, PO2ART, HCO3, TCO2, ACIDBASEDEF, O2SAT   Lab Results  Component Value Date   TSH 2.024 07/16/2010   BNP (last 3 results) No results for input(s): BNP in the last 8760 hours.  ProBNP (last 3 results) No results for input(s): PROBNP in the last 8760 hours.  Cardiac Panel (last 3 results) No results for input(s): CKTOTAL, CKMB, TROPONINI, RELINDX in the last 72 hours.  Iron/TIBC/Ferritin/ %Sat No results found for: IRON, TIBC, FERRITIN, IRONPCTSAT   EKG Orders placed or performed in visit on 12/21/15  . EKG 12-Lead     Prior Assessment and Plan Problem List as of 02/15/2016      Cardiovascular and Mediastinum   Atrial fibrillation [facility])   Last Assessment & Plan 05/25/2011 Office Visit Written 05/25/2011  4:21 PM by Yehuda Savannah, MD    Heart rate control is excellent, and patient appears to be asymptomatic.  We will continue the strategy of heart rate control and anticoagulation.      Hypertension   Last Assessment & Plan 11/02/2012 Office Visit Written 11/02/2012  3:00 PM by Yehuda Savannah, MD    No elevation in the systolic or diastolic BP has been recorded for at least the past 4 years. Current medication will be continued.        Musculoskeletal and Integument   Degenerative joint disease   OA (osteoarthritis) of knee     Other   Hyperlipidemia   Last Assessment & Plan 11/02/2012 Office Visit Edited 11/02/2012  2:59 PM by Yehuda Savannah, MD    Patient is being treated with fish oil and low-dose niacin for elevated triglycerides and low HDL, but needs statin therapy as well. Atorvastatin will be added to medical regime and lipid profile repeated.      Chronic anticoagulation   Last Assessment & Plan 11/02/2012 Office Visit Written  11/02/2012  3:03 PM by Yehuda Savannah, MD    No evidence for occult GI blood loss. Stool samples for Hemoccult testing will be requested. INR as have been stable and therapeutic.      Sleep apnea   Thrombocytopenia ([facility])   Postop Hyponatremia   Acute blood loss  anemia   Knee stiffness   Knee pain   Difficulty in walking(719.7)   Encounter for therapeutic drug monitoring   Edema       Imaging: No results found.

## 2016-02-16 ENCOUNTER — Other Ambulatory Visit: Payer: Self-pay

## 2016-02-16 ENCOUNTER — Telehealth: Payer: Self-pay | Admitting: Adult Health

## 2016-02-16 DIAGNOSIS — Z01818 Encounter for other preprocedural examination: Secondary | ICD-10-CM

## 2016-02-16 LAB — CBC WITH DIFFERENTIAL/PLATELET
BASOS PCT: 1 %
Basophils Absolute: 63 cells/uL (ref 0–200)
EOS PCT: 1 %
Eosinophils Absolute: 63 cells/uL (ref 15–500)
HEMATOCRIT: 46.1 % (ref 38.5–50.0)
HEMOGLOBIN: 14.9 g/dL (ref 13.2–17.1)
LYMPHS ABS: 1890 {cells}/uL (ref 850–3900)
LYMPHS PCT: 30 %
MCH: 30.5 pg (ref 27.0–33.0)
MCHC: 32.3 g/dL (ref 32.0–36.0)
MCV: 94.3 fL (ref 80.0–100.0)
MONO ABS: 504 {cells}/uL (ref 200–950)
MPV: 12.2 fL (ref 7.5–12.5)
Monocytes Relative: 8 %
Neutro Abs: 3780 cells/uL (ref 1500–7800)
Neutrophils Relative %: 60 %
Platelets: 152 10*3/uL (ref 140–400)
RBC: 4.89 MIL/uL (ref 4.20–5.80)
RDW: 14.3 % (ref 11.0–15.0)
WBC: 6.3 10*3/uL (ref 3.8–10.8)

## 2016-02-16 LAB — BASIC METABOLIC PANEL
BUN: 24 mg/dL (ref 7–25)
CHLORIDE: 106 mmol/L (ref 98–110)
CO2: 25 mmol/L (ref 20–31)
Calcium: 9.1 mg/dL (ref 8.6–10.3)
Creat: 0.98 mg/dL (ref 0.70–1.18)
Glucose, Bld: 106 mg/dL — ABNORMAL HIGH (ref 65–99)
POTASSIUM: 4.6 mmol/L (ref 3.5–5.3)
SODIUM: 141 mmol/L (ref 135–146)

## 2016-02-16 LAB — BRAIN NATRIURETIC PEPTIDE: BRAIN NATRIURETIC PEPTIDE: 899 pg/mL — AB (ref <100)

## 2016-02-16 MED ORDER — AMIODARONE HCL 200 MG PO TABS: 200.0000 mg | ORAL_TABLET | Freq: Two times a day (BID) | ORAL | Status: DC

## 2016-02-16 NOTE — Telephone Encounter (Signed)
I called patient back and wife said he was not there but they ae happy is feeling better and she wants to encourage him to go back to the gym.She said he used his urinal during the night

## 2016-02-16 NOTE — Telephone Encounter (Signed)
FYI: Patient states that Joshua [name] called to see how he was feeling today. He wanted to report that he is feeling "100% better". / tg

## 2016-02-19 ENCOUNTER — Encounter: Payer: Self-pay | Admitting: *Deleted

## 2016-02-19 ENCOUNTER — Ambulatory Visit (INDEPENDENT_AMBULATORY_CARE_PROVIDER_SITE_OTHER): Payer: Medicare Other | Admitting: Adult Health

## 2016-02-19 ENCOUNTER — Encounter: Payer: Self-pay | Admitting: Adult Health

## 2016-02-19 VITALS — BP 138/74 | HR 84 | Ht 69.0 in | Wt 242.0 lb

## 2016-02-19 DIAGNOSIS — R079 Chest pain, unspecified: Secondary | ICD-10-CM | POA: Diagnosis not present

## 2016-02-19 DIAGNOSIS — I10 Essential (primary) hypertension: Secondary | ICD-10-CM

## 2016-02-19 DIAGNOSIS — R000 Tachycardia, unspecified: Secondary | ICD-10-CM

## 2016-02-19 MED ORDER — DILTIAZEM HCL 60 MG PO TABS: 60.0000 mg | ORAL_TABLET | Freq: Two times a day (BID) | ORAL | Status: DC

## 2016-02-19 NOTE — Progress Notes (Addendum)
Cardiology Office Note   Date:  02/19/2016   ID:  Joshua [name], DOB 05-03-1941, MRN [id number]  PCP:  Asencion Noble, MD  Cardiologist: Cloria Spring, NP   Chief Complaint  Patient presents with  . Atrial Fibrillation      History of Present Illness: Joshua [name] is a 75 y.o. male who presents for ongoing assessment and management of atrial fibrillation, hyperlipidemia, and hypertension. The patient was continued on Coumadin therapy. The patient was complaining of dyspnea and rapid heart rhythm after returning from hunting on last office visit dated 02/15/2016. He was found to be in atrial fibrillation with RVR. He was continued on atenolol 100 mg twice a day, as he did not tolerate metoprolol.   He started on amiodarone 200 mg p.o. twice a day on last office visit for heart rate control as echo had not been completed for LV function.. Followup labs to include CBC and BMP were to evaluate for anemia, and a BMET to evaluate for kidney function. He'll need to have a repeat echocardiogram ordered today once HR is controlled.  He is here for very close followup to evaluate symptoms of heart rate control with medication adjustments.  Labs are completed on 02/15/2016: Sodium 141 potassium 4.6 chloride 108 CO2 25 BUN 24 creatinine 0.98. BNP 899. Hemoglobin 14.9 hematocrit 46.1 white blood cells 6.3 platelets 152.   Much better today. Less short of breath, more energy. He is been medically compliant.  Past Medical History  Diagnosis Date  . Hypertension   . Hyperlipidemia   . Degenerative joint disease     Right knee  . Chronic anticoagulation   . Sleep apnea     Rx-CPAP  . Atrial fibrillation ([place]) 06/2010  . Elevated cholesterol   . Shortness of breath     on exertion  . Nocturia   . BPH (benign prostatic hyperplasia)     Past Surgical History  Procedure Laterality Date  . Ventral hernia repair  03/2008, 02/2009  . Colonoscopy  03/10/2005  . 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 Total knee arthroplasty Left 03/10/2014    Procedure: LEFT TOTAL KNEE ARTHROPLASTY;  Surgeon: Gearlean Alf, MD;  Location: [facility];  Service: Orthopedics;  Laterality: Left;     Current Outpatient Prescriptions  Medication Sig Dispense Refill  . amiodarone (PACERONE) 200 MG tablet Take 1 tablet (200 mg total) by mouth 2 (two) times daily. 90 tablet 3  . atenolol (TENORMIN) 100 MG tablet Take 1 tablet (100 mg total) by mouth 2 (two) times daily. 180 tablet 3  . furosemide (LASIX) 20 MG tablet Take 20 mg daily for the next 3 days. 30 tablet 3  . lisinopril (PRINIVIL,ZESTRIL) 10 MG tablet Take 1 tablet (10 mg total) by mouth 2 (two) times daily. 180 tablet 3  . Omega-3 Fatty Acids (FISH OIL) 1000 MG CAPS Take 2 capsules by mouth daily.    . potassium chloride SA (K-DUR,KLOR-CON) 20 MEQ tablet Take 20 mg daily for the next 3 days 30 tablet 3  . warfarin (COUMADIN) 10 MG tablet Take 1 tablet daily except 1/2 tablet on Sundays, Tuesdays and Thursdays 45 tablet 3  . diltiazem (CARDIZEM) 60 MG tablet Take 1 tablet (60 mg total) by mouth 2 (two) times daily. 60 tablet 6   No current facility-administered medications for this visit.  Allergies:   Review of patient's allergies indicates no known allergies.    Social History:  The patient  reports that he has been smoking Cigars.  He has never used smokeless tobacco. He reports that he does not drink alcohol or use illicit drugs.   Family History:  The patient's family history includes Heart attack in his father.    ROS: All other systems are reviewed and negative. Unless otherwise mentioned in H&P    PHYSICAL EXAM: VS:  BP 138/74 mmHg  Pulse 84  Ht 5\' 9"  (1.753 m)  Wt 242 lb (109.77 kg)  BMI 35.72 kg/m2  SpO2 94% , BMI Body mass index is 35.72 kg/(m^2). GEN: Well nourished, well  developed, in no acute distress HEENT: normal Neck: no JVD, carotid bruits, or masses Cardiac: IRRR; no murmurs, rubs, or gallops,no edema  Respiratory:  clear to auscultation bilaterally, normal work of breathing GI: soft, nontender, nondistended, + BS MS: no deformity or atrophy Skin: warm and dry, no rash Neuro:  Strength and sensation are intact Psych: euthymic mood, full affect   EKG:  The ekg ordered today demonstrates atrial fib flutter heart rate 108 beats per minute.   Recent Labs: 02/15/2016: BUN 24; Creat 0.98; Hemoglobin 14.9; Platelets 152; Potassium 4.6; Sodium 141    Lipid Panel    Component Value Date/Time   CHOL 187 11/28/2012 0830   TRIG 286* 11/28/2012 0830   HDL 30* 11/28/2012 0830   CHOLHDL 6.2 11/28/2012 0830   VLDL 57* 11/28/2012 0830   LDLCALC 100* 11/28/2012 0830      Wt Readings from Last 3 Encounters:  02/19/16 242 lb (109.77 kg)  02/15/16 242 lb (109.77 kg)  01/01/16 242 lb (109.77 kg)      ASSESSMENT AND PLAN:  1. Atrial fib with RVR: He appears to be tolerating amiodarone 200 mg by mouth twice a day. Heart rate is better controlled. He remains on Coumadin. INR is 1.7 in the office today. The plan is now to discontinue amlodipine, and begin him on diltiazem 60 mg twice a day, continue atenolol 100 mg twice a day, and amiodarone 200 mg twice a day for an additional week. He will reduce this dose down to 200 mg daily or discontinue on follow-up with Dr. Harl Bowie at his discretion.   He was unable to tolerate metoprolol in the past, , and therefore amiodarone was started to keep heart rate under contro along with atenololl. HR  is not completely controlled on today's office visit, but is much better than 139 beats per minute on last visit, down to 108 beats per minute. Addition of diltiazem may help to regulate it further.There is no evidence of fluid overload as was seen on last office visit. Marland Kitchen He took Lasix and potassium for 3 days. He is now to take  it as needed for weight gain or edema. He will need follow up EKG on next office visit.   2. Chest pressure: Improved with HR improvement. We'll plan a exercise Cardiolite and an echocardiogram. This will help with medical management. Although the patient's heart rate is better controlled and he is feeling better, he still continues to have episodes of chest pressure. He has been told to not take atenolol the night before the stress test in the morning of the stress test.  3. Hypertension: Blood pressure is currently well-controlled.we'll see him on Monday morning for stress test to reevaluate his response to medication.   Current medicines are reviewed at length with the  patient today.    Labs/ tests ordered today include: exercise Cardiolite and echocardiogram.  Orders Placed This Encounter  Procedures  . NM Myocar Multi W/Spect W/Wall Motion / EF  . EKG 12-Lead  . Echocardiogram     Disposition:   FU with Dr.Branch previously scheduled appointment on 03/04/2016.  Signed, Jory Sims, NP  02/19/2016 4:42 PM    [facility] [address], [place], [place] [address] Phone: [phone]; Fax: [phone]

## 2016-02-19 NOTE — Progress Notes (Deleted)
Name: Joshua [name]    DOB: [birth year]  Age: 75 y.o.  MR#: [id number]       PCP:  Asencion Noble, MD      Insurance: Payor: MEDICARE / Plan: MEDICARE PART A AND B / Product Type: *No Product type* /   CC:   No chief complaint on file.   VS Filed Vitals:   02/19/16 1603  BP: 138/74  Pulse: 84  Height: 5\' 9"  (1.753 m)  Weight: 242 lb (109.77 kg)  SpO2: 94%    Weights Current Weight  02/19/16 242 lb (109.77 kg)  02/15/16 242 lb (109.77 kg)  01/01/16 242 lb (109.77 kg)    Blood Pressure  BP Readings from Last 3 Encounters:  02/19/16 138/74  02/15/16 128/78  01/01/16 106/72     Admit date:  (Not on file) Last encounter with RMR:  02/16/2016   Allergy Review of patient's allergies indicates no known allergies.  Current Outpatient Prescriptions  Medication Sig Dispense Refill  . amiodarone (PACERONE) 200 MG tablet Take 1 tablet (200 mg total) by mouth 2 (two) times daily. 90 tablet 3  . amLODipine (NORVASC) 5 MG tablet Take 5 mg by mouth daily before breakfast.     . atenolol (TENORMIN) 100 MG tablet Take 1 tablet (100 mg total) by mouth 2 (two) times daily. 180 tablet 3  . furosemide (LASIX) 20 MG tablet Take 20 mg daily for the next 3 days. 30 tablet 3  . lisinopril (PRINIVIL,ZESTRIL) 10 MG tablet Take 1 tablet (10 mg total) by mouth 2 (two) times daily. 180 tablet 3  . Omega-3 Fatty Acids (FISH OIL) 1000 MG CAPS Take 2 capsules by mouth daily.    . potassium chloride SA (K-DUR,KLOR-CON) 20 MEQ tablet Take 20 mg daily for the next 3 days 30 tablet 3  . warfarin (COUMADIN) 10 MG tablet Take 1 tablet daily except 1/2 tablet on Sundays, Tuesdays and Thursdays 45 tablet 3   No current facility-administered medications for this visit.    Discontinued Meds:   There are no discontinued medications.  Patient Active Problem List   Diagnosis Date Noted  . Edema 03/31/2014  . Encounter for therapeutic drug monitoring 12/23/2013  . Knee stiffness 02/06/2013  . Knee pain 02/06/2013   . Difficulty in walking(719.7) 02/06/2013  . Acute blood loss anemia 01/17/2013  . Postop Hyponatremia 01/15/2013  . OA (osteoarthritis) of knee 01/14/2013  . Thrombocytopenia ([place]) 05/26/2011  . Sleep apnea   . Degenerative joint disease   . Chronic anticoagulation 02/02/2011  . Hyperlipidemia 07/29/2010  . Atrial fibrillation ([place]) 07/29/2010  . Hypertension 12/07/2007    LABS    Component Value Date/Time   NA 141 02/15/2016 1440   NA 141 03/12/2014 0432   NA 140 03/11/2014 0424   K 4.6 02/15/2016 1440   K 4.3 03/12/2014 0432   K 4.1 03/11/2014 0424   CL 106 02/15/2016 1440   CL 106 03/12/2014 0432   CL 105 03/11/2014 0424   CO2 25 02/15/2016 1440   CO2 24 03/12/2014 0432   CO2 26 03/11/2014 0424   GLUCOSE 106* 02/15/2016 1440   GLUCOSE 134* 03/12/2014 0432   GLUCOSE 167* 03/11/2014 0424   BUN 24 02/15/2016 1440   BUN 15 03/12/2014 0432   BUN 14 03/11/2014 0424   CREATININE 0.98 02/15/2016 1440   CREATININE 0.83 03/12/2014 0432   CREATININE 0.93 03/11/2014 0424   CREATININE 0.92 02/28/2014 1330   CREATININE 1.18 05/25/2011 1556   CALCIUM 9.1 02/15/2016  1440   CALCIUM 9.0 03/12/2014 0432   CALCIUM 8.8 03/11/2014 0424   GFRNONAA 86* 03/12/2014 0432   GFRNONAA 83* 03/11/2014 0424   GFRNONAA 83* 02/28/2014 1330   GFRAA >90 03/12/2014 0432   GFRAA >90 03/11/2014 0424   GFRAA >90 02/28/2014 1330   CMP     Component Value Date/Time   NA 141 02/15/2016 1440   K 4.6 02/15/2016 1440   CL 106 02/15/2016 1440   CO2 25 02/15/2016 1440   GLUCOSE 106* 02/15/2016 1440   BUN 24 02/15/2016 1440   CREATININE 0.98 02/15/2016 1440   CREATININE 0.83 03/12/2014 0432   CALCIUM 9.1 02/15/2016 1440   PROT 7.1 02/28/2014 1330   ALBUMIN 3.8 02/28/2014 1330   AST 26 02/28/2014 1330   ALT 18 02/28/2014 1330   ALKPHOS 53 02/28/2014 1330   BILITOT 0.8 02/28/2014 1330   GFRNONAA 86* 03/12/2014 0432   GFRAA >90 03/12/2014 0432       Component Value Date/Time   WBC 6.3  02/15/2016 1440   WBC 9.5 03/13/2014 0442   WBC 14.0* 03/12/2014 0432   HGB 14.9 02/15/2016 1440   HGB 13.5 03/13/2014 0442   HGB 13.1 03/12/2014 0432   HCT 46.1 02/15/2016 1440   HCT 40.7 03/13/2014 0442   HCT 38.3* 03/12/2014 0432   MCV 94.3 02/15/2016 1440   MCV 94.4 03/13/2014 0442   MCV 93.0 03/12/2014 0432    Lipid Panel     Component Value Date/Time   CHOL 187 11/28/2012 0830   TRIG 286* 11/28/2012 0830   HDL 30* 11/28/2012 0830   CHOLHDL 6.2 11/28/2012 0830   VLDL 57* 11/28/2012 0830   LDLCALC 100* 11/28/2012 0830    ABG No results found for: PHART, PCO2ART, PO2ART, HCO3, TCO2, ACIDBASEDEF, O2SAT   Lab Results  Component Value Date   TSH 2.024 07/16/2010   BNP (last 3 results) No results for input(s): BNP in the last 8760 hours.  ProBNP (last 3 results) No results for input(s): PROBNP in the last 8760 hours.  Cardiac Panel (last 3 results) No results for input(s): CKTOTAL, CKMB, TROPONINI, RELINDX in the last 72 hours.  Iron/TIBC/Ferritin/ %Sat No results found for: IRON, TIBC, FERRITIN, IRONPCTSAT   EKG Orders placed or performed in visit on 02/19/16  . EKG 12-Lead     Prior Assessment and Plan Problem List as of 02/19/2016      Cardiovascular and Mediastinum   Atrial fibrillation [facility])   Last Assessment & Plan 05/25/2011 Office Visit Written 05/25/2011  4:21 PM by Yehuda Savannah, MD    Heart rate control is excellent, and patient appears to be asymptomatic.  We will continue the strategy of heart rate control and anticoagulation.      Hypertension   Last Assessment & Plan 11/02/2012 Office Visit Written 11/02/2012  3:00 PM by Yehuda Savannah, MD    No elevation in the systolic or diastolic BP has been recorded for at least the past 4 years. Current medication will be continued.        Musculoskeletal and Integument   Degenerative joint disease   OA (osteoarthritis) of knee     Other   Hyperlipidemia   Last Assessment & Plan 11/02/2012 Office  Visit Edited 11/02/2012  2:59 PM by Yehuda Savannah, MD    Patient is being treated with fish oil and low-dose niacin for elevated triglycerides and low HDL, but needs statin therapy as well. Atorvastatin will be added to medical regime and lipid  profile repeated.      Chronic anticoagulation   Last Assessment & Plan 11/02/2012 Office Visit Written 11/02/2012  3:03 PM by Yehuda Savannah, MD    No evidence for occult GI blood loss. Stool samples for Hemoccult testing will be requested. INR as have been stable and therapeutic.      Sleep apnea   Thrombocytopenia ([facility])   Postop Hyponatremia   Acute blood loss anemia   Knee stiffness   Knee pain   Difficulty in walking(719.7)   Encounter for therapeutic drug monitoring   Edema       Imaging: No results found.

## 2016-02-19 NOTE — Patient Instructions (Addendum)
Your physician recommends that you schedule a follow-up appointment in:   Your physician has recommended you make the following change in your medication:   Stop Taking Amlodipine (Norvasc) Start Taking Diltiazem 60 mg Two Times Daily  Your physician has requested that you have an echocardiogram. Echocardiography is a painless test that uses sound waves to create images of your heart. It provides your doctor with information about the size and shape of your heart and how well your heart's chambers and valves are working. This procedure takes approximately one hour. There are no restrictions for this procedure.  Your physician has requested that you have en exercise stress myoview. For further information please visit [email or web address]. Please follow instruction sheet, as given. DO NOT TAKE ATENOLOL THE NIGHT BEFORE OR THE MORNING OF STRESS TEST.  If you need a refill on your cardiac medications before your next appointment, please call your pharmacy.  Thank you for choosing [facility]!

## 2016-02-22 ENCOUNTER — Ambulatory Visit (INDEPENDENT_AMBULATORY_CARE_PROVIDER_SITE_OTHER): Payer: Medicare Other | Admitting: *Deleted

## 2016-02-22 DIAGNOSIS — Z5181 Encounter for therapeutic drug level monitoring: Secondary | ICD-10-CM

## 2016-02-22 DIAGNOSIS — I4891 Unspecified atrial fibrillation: Secondary | ICD-10-CM

## 2016-02-22 LAB — POCT INR: INR: 1.9

## 2016-02-24 ENCOUNTER — Encounter (HOSPITAL_COMMUNITY)
Admission: RE | Admit: 2016-02-24 | Discharge: 2016-02-24 | Disposition: A | Discharge status: Home / Self Care | Payer: Medicare Other | Source: Ambulatory Visit | Attending: Adult Health | Admitting: Adult Health

## 2016-02-24 ENCOUNTER — Encounter (HOSPITAL_COMMUNITY): Payer: Self-pay

## 2016-02-24 ENCOUNTER — Ambulatory Visit (HOSPITAL_COMMUNITY)
Admission: RE | Admit: 2016-02-24 | Discharge: 2016-02-24 | Disposition: A | Discharge status: Home / Self Care | Payer: Medicare Other | Source: Ambulatory Visit | Attending: Adult Health | Admitting: Adult Health

## 2016-02-24 ENCOUNTER — Inpatient Hospital Stay (HOSPITAL_COMMUNITY): Admission: RE | Admit: 2016-02-24 | Payer: Medicare Other | Source: Ambulatory Visit

## 2016-02-24 DIAGNOSIS — R931 Abnormal findings on diagnostic imaging of heart and coronary circulation: Secondary | ICD-10-CM | POA: Diagnosis not present

## 2016-02-24 DIAGNOSIS — E785 Hyperlipidemia, unspecified: Secondary | ICD-10-CM | POA: Insufficient documentation

## 2016-02-24 DIAGNOSIS — I510 Cardiac septal defect, acquired: Secondary | ICD-10-CM | POA: Insufficient documentation

## 2016-02-24 DIAGNOSIS — I119 Hypertensive heart disease without heart failure: Secondary | ICD-10-CM | POA: Insufficient documentation

## 2016-02-24 DIAGNOSIS — R000 Tachycardia, unspecified: Secondary | ICD-10-CM | POA: Insufficient documentation

## 2016-02-24 DIAGNOSIS — R079 Chest pain, unspecified: Secondary | ICD-10-CM | POA: Insufficient documentation

## 2016-02-24 DIAGNOSIS — I071 Rheumatic tricuspid insufficiency: Secondary | ICD-10-CM | POA: Insufficient documentation

## 2016-02-24 DIAGNOSIS — I340 Nonrheumatic mitral (valve) insufficiency: Secondary | ICD-10-CM | POA: Insufficient documentation

## 2016-02-24 DIAGNOSIS — I4891 Unspecified atrial fibrillation: Secondary | ICD-10-CM | POA: Insufficient documentation

## 2016-02-24 LAB — NM MYOCAR MULTI W/SPECT W/WALL MOTION / EF
CHL CUP NUCLEAR SDS: 4
CHL CUP NUCLEAR SRS: 3
CHL CUP RESTING HR STRESS: 93 {beats}/min
LV dias vol: 244 mL (ref 62–150)
LV sys vol: 193 mL
NUC STRESS TID: 1.01
Peak HR: 115 {beats}/min
RATE: 0.44
SSS: 7

## 2016-02-24 MED ORDER — TECHNETIUM TC 99M SESTAMIBI GENERIC - CARDIOLITE
30.0000 | Freq: Once | INTRAVENOUS | Status: AC | PRN
  Administered 2016-02-24: 29.4 via INTRAVENOUS

## 2016-02-24 MED ORDER — SODIUM CHLORIDE 0.9% FLUSH
INTRAVENOUS | Status: AC
Start: 2016-02-24 — End: 2016-02-24
  Administered 2016-02-24: 10 mL via INTRAVENOUS
  Filled 2016-02-24: qty 10

## 2016-02-24 MED ORDER — REGADENOSON 0.4 MG/5ML IV SOLN
INTRAVENOUS | Status: AC
Start: 2016-02-24 — End: 2016-02-24
  Administered 2016-02-24: 0.4 mg via INTRAVENOUS
  Filled 2016-02-24: qty 5

## 2016-02-24 MED ORDER — TECHNETIUM TC 99M SESTAMIBI - CARDIOLITE
10.0000 | Freq: Once | INTRAVENOUS | Status: AC | PRN
  Administered 2016-02-24: 10:00:00 10.4 via INTRAVENOUS

## 2016-02-25 ENCOUNTER — Other Ambulatory Visit: Payer: Self-pay | Admitting: Adult Health

## 2016-02-25 ENCOUNTER — Encounter: Payer: Self-pay | Admitting: *Deleted

## 2016-02-25 MED ORDER — CARVEDILOL 3.125 MG PO TABS: 3.1250 mg | ORAL_TABLET | Freq: Two times a day (BID) | ORAL | Status: DC

## 2016-02-25 NOTE — Telephone Encounter (Signed)
-----   Message from Lendon Colonel, NP sent at 02/25/2016  7:15 AM EDT ----- Please send copy to Dr. Harl Bowie. He may need cath.

## 2016-02-26 DIAGNOSIS — Z01818 Encounter for other preprocedural examination: Secondary | ICD-10-CM | POA: Diagnosis not present

## 2016-02-26 LAB — CBC WITH DIFFERENTIAL/PLATELET
BASOS PCT: 1 %
Basophils Absolute: 41 cells/uL (ref 0–200)
EOS PCT: 2 %
Eosinophils Absolute: 82 cells/uL (ref 15–500)
HEMATOCRIT: 45.9 % (ref 38.5–50.0)
HEMOGLOBIN: 15.2 g/dL (ref 13.2–17.1)
LYMPHS ABS: 1394 {cells}/uL (ref 850–3900)
Lymphocytes Relative: 34 %
MCH: 30.4 pg (ref 27.0–33.0)
MCHC: 33.1 g/dL (ref 32.0–36.0)
MCV: 91.8 fL (ref 80.0–100.0)
MONO ABS: 410 {cells}/uL (ref 200–950)
MPV: 11.8 fL (ref 7.5–12.5)
Monocytes Relative: 10 %
NEUTROS ABS: 2173 {cells}/uL (ref 1500–7800)
Neutrophils Relative %: 53 %
Platelets: 140 10*3/uL (ref 140–400)
RBC: 5 MIL/uL (ref 4.20–5.80)
RDW: 14 % (ref 11.0–15.0)
WBC: 4.1 10*3/uL (ref 3.8–10.8)

## 2016-02-26 LAB — PROTIME-INR
INR: 1.87 — AB (ref <1.50)
PROTHROMBIN TIME: 21.8 s — AB (ref 11.6–15.2)

## 2016-02-26 LAB — BASIC METABOLIC PANEL
BUN: 17 mg/dL (ref 7–25)
CO2: 26 mmol/L (ref 20–31)
Calcium: 9.1 mg/dL (ref 8.6–10.3)
Chloride: 106 mmol/L (ref 98–110)
Creat: 1.07 mg/dL (ref 0.70–1.18)
Glucose, Bld: 82 mg/dL (ref 65–99)
POTASSIUM: 4.5 mmol/L (ref 3.5–5.3)
SODIUM: 140 mmol/L (ref 135–146)

## 2016-03-02 ENCOUNTER — Other Ambulatory Visit: Payer: Self-pay | Admitting: Cardiology

## 2016-03-04 ENCOUNTER — Encounter (HOSPITAL_COMMUNITY): Payer: Self-pay

## 2016-03-04 ENCOUNTER — Observation Stay (HOSPITAL_COMMUNITY)
Admission: RE | Admit: 2016-03-04 | Discharge: 2016-03-06 | Disposition: A | Discharge status: Home / Self Care | Payer: Medicare Other | Source: Ambulatory Visit | Attending: Cardiovascular Disease | Admitting: Cardiovascular Disease

## 2016-03-04 ENCOUNTER — Encounter (HOSPITAL_COMMUNITY)
Admission: RE | Disposition: A | Discharge status: Home / Self Care | Payer: Self-pay | Source: Ambulatory Visit | Attending: Cardiovascular Disease

## 2016-03-04 ENCOUNTER — Ambulatory Visit: Payer: Medicare Other | Admitting: Cardiology

## 2016-03-04 DIAGNOSIS — I481 Persistent atrial fibrillation: Secondary | ICD-10-CM | POA: Diagnosis not present

## 2016-03-04 DIAGNOSIS — I110 Hypertensive heart disease with heart failure: Secondary | ICD-10-CM | POA: Insufficient documentation

## 2016-03-04 DIAGNOSIS — I428 Other cardiomyopathies: Secondary | ICD-10-CM | POA: Diagnosis not present

## 2016-03-04 DIAGNOSIS — I2510 Atherosclerotic heart disease of native coronary artery without angina pectoris: Secondary | ICD-10-CM | POA: Insufficient documentation

## 2016-03-04 DIAGNOSIS — E785 Hyperlipidemia, unspecified: Secondary | ICD-10-CM | POA: Diagnosis not present

## 2016-03-04 DIAGNOSIS — I5023 Acute on chronic systolic (congestive) heart failure: Secondary | ICD-10-CM | POA: Diagnosis not present

## 2016-03-04 DIAGNOSIS — I4891 Unspecified atrial fibrillation: Secondary | ICD-10-CM | POA: Diagnosis present

## 2016-03-04 DIAGNOSIS — Z7901 Long term (current) use of anticoagulants: Secondary | ICD-10-CM | POA: Insufficient documentation

## 2016-03-04 DIAGNOSIS — R9439 Abnormal result of other cardiovascular function study: Secondary | ICD-10-CM | POA: Insufficient documentation

## 2016-03-04 DIAGNOSIS — I480 Paroxysmal atrial fibrillation: Secondary | ICD-10-CM | POA: Diagnosis not present

## 2016-03-04 DIAGNOSIS — I10 Essential (primary) hypertension: Secondary | ICD-10-CM | POA: Diagnosis present

## 2016-03-04 DIAGNOSIS — I509 Heart failure, unspecified: Secondary | ICD-10-CM

## 2016-03-04 HISTORY — PX: CARDIAC CATHETERIZATION: SHX172

## 2016-03-04 LAB — CBC
HCT: 45.5 % (ref 39.0–52.0)
HEMOGLOBIN: 14.8 g/dL (ref 13.0–17.0)
MCH: 31 pg (ref 26.0–34.0)
MCHC: 32.5 g/dL (ref 30.0–36.0)
MCV: 95.2 fL (ref 78.0–100.0)
PLATELETS: 114 10*3/uL — AB (ref 150–400)
RBC: 4.78 MIL/uL (ref 4.22–5.81)
RDW: 13.4 % (ref 11.5–15.5)
WBC: 4.6 10*3/uL (ref 4.0–10.5)

## 2016-03-04 LAB — BASIC METABOLIC PANEL
ANION GAP: 12 (ref 5–15)
BUN: 25 mg/dL — ABNORMAL HIGH (ref 6–20)
CALCIUM: 9 mg/dL (ref 8.9–10.3)
CO2: 23 mmol/L (ref 22–32)
Chloride: 110 mmol/L (ref 101–111)
Creatinine, Ser: 1 mg/dL (ref 0.61–1.24)
GFR calc Af Amer: >60 mL/min (ref >60)
GLUCOSE: 113 mg/dL — AB (ref 65–99)
Potassium: 4.4 mmol/L (ref 3.5–5.1)
Sodium: 145 mmol/L (ref 135–145)

## 2016-03-04 LAB — PROTIME-INR
INR: 1.59 — AB (ref 0.00–1.49)
PROTHROMBIN TIME: 19 s — AB (ref 11.6–15.2)

## 2016-03-04 SURGERY — LEFT HEART CATH AND CORONARY ANGIOGRAPHY

## 2016-03-04 MED ORDER — SODIUM CHLORIDE 0.9% FLUSH
3.0000 mL | Freq: Two times a day (BID) | INTRAVENOUS | Status: DC
  Administered 2016-03-05 – 2016-03-06 (×3): 3 mL via INTRAVENOUS

## 2016-03-04 MED ORDER — DILTIAZEM LOAD VIA INFUSION
10.0000 mg | Freq: Once | INTRAVENOUS | Status: AC
  Administered 2016-03-04: 10 mg via INTRAVENOUS
  Filled 2016-03-04: qty 10

## 2016-03-04 MED ORDER — SODIUM CHLORIDE 0.9% FLUSH: 3.0000 mL | INTRAVENOUS | Status: DC | PRN

## 2016-03-04 MED ORDER — HEPARIN (PORCINE) IN NACL 100-0.45 UNIT/ML-% IJ SOLN
1650.0000 [IU]/h | INTRAMUSCULAR | Status: DC
  Administered 2016-03-04: 1600 [IU]/h via INTRAVENOUS
  Administered 2016-03-05: 1650 [IU]/h via INTRAVENOUS
  Filled 2016-03-04: qty 250

## 2016-03-04 MED ORDER — METOPROLOL TARTRATE 50 MG PO TABS
50.0000 mg | ORAL_TABLET | Freq: Two times a day (BID) | ORAL | Status: DC
  Administered 2016-03-04 – 2016-03-05 (×3): 50 mg via ORAL
  Filled 2016-03-04 (×4): qty 1

## 2016-03-04 MED ORDER — CARVEDILOL 3.125 MG PO TABS
6.2500 mg | ORAL_TABLET | Freq: Once | ORAL | Status: AC
  Administered 2016-03-04: 6.25 mg via ORAL
  Filled 2016-03-04: qty 2

## 2016-03-04 MED ORDER — HEPARIN (PORCINE) IN NACL 2-0.9 UNIT/ML-% IJ SOLN
INTRAMUSCULAR | Status: AC
  Filled 2016-03-04: qty 1000

## 2016-03-04 MED ORDER — LIDOCAINE HCL (PF) 1 % IJ SOLN
INTRAMUSCULAR | Status: AC
  Filled 2016-03-04: qty 30

## 2016-03-04 MED ORDER — VERAPAMIL HCL 2.5 MG/ML IV SOLN
INTRA_ARTERIAL | Status: DC | PRN
  Administered 2016-03-04: 15 mL via INTRA_ARTERIAL

## 2016-03-04 MED ORDER — VERAPAMIL HCL 2.5 MG/ML IV SOLN
INTRAVENOUS | Status: AC
  Filled 2016-03-04: qty 2

## 2016-03-04 MED ORDER — DILTIAZEM HCL 100 MG IV SOLR
5.0000 mg/h | INTRAVENOUS | Status: DC
  Administered 2016-03-04 (×2): 5 mg/h via INTRAVENOUS
  Filled 2016-03-04 (×2): qty 100

## 2016-03-04 MED ORDER — ASPIRIN 81 MG PO CHEW
CHEWABLE_TABLET | ORAL | Status: AC
  Administered 2016-03-04: 09:00:00
  Filled 2016-03-04: qty 1

## 2016-03-04 MED ORDER — NITROGLYCERIN 1 MG/10 ML FOR IR/CATH LAB
INTRA_ARTERIAL | Status: AC
  Filled 2016-03-04: qty 10

## 2016-03-04 MED ORDER — SODIUM CHLORIDE 0.9% FLUSH: 3.0000 mL | Freq: Two times a day (BID) | INTRAVENOUS | Status: DC

## 2016-03-04 MED ORDER — ASPIRIN 81 MG PO CHEW
81.0000 mg | CHEWABLE_TABLET | ORAL | Status: AC
  Administered 2016-03-04: 81 mg via ORAL

## 2016-03-04 MED ORDER — SODIUM CHLORIDE 0.9 % IV SOLN
INTRAVENOUS | Status: DC
Start: 2016-03-04 — End: 2016-03-04

## 2016-03-04 MED ORDER — HEPARIN SODIUM (PORCINE) 1000 UNIT/ML IJ SOLN
INTRAMUSCULAR | Status: AC
  Filled 2016-03-04: qty 1

## 2016-03-04 MED ORDER — DIAZEPAM 5 MG PO TABS: 5.0000 mg | ORAL_TABLET | Freq: Four times a day (QID) | ORAL | Status: DC | PRN

## 2016-03-04 MED ORDER — HEPARIN SODIUM (PORCINE) 1000 UNIT/ML IJ SOLN
INTRAMUSCULAR | Status: DC | PRN
  Administered 2016-03-04: 5000 [IU] via INTRAVENOUS

## 2016-03-04 MED ORDER — MIDAZOLAM HCL 2 MG/2ML IJ SOLN
INTRAMUSCULAR | Status: DC | PRN
  Administered 2016-03-04: 2 mg via INTRAVENOUS

## 2016-03-04 MED ORDER — ACETAMINOPHEN 325 MG PO TABS
650.0000 mg | ORAL_TABLET | ORAL | Status: DC | PRN
  Administered 2016-03-04: 650 mg via ORAL
  Filled 2016-03-04: qty 2

## 2016-03-04 MED ORDER — LISINOPRIL 10 MG PO TABS
10.0000 mg | ORAL_TABLET | Freq: Two times a day (BID) | ORAL | Status: DC
  Administered 2016-03-05 – 2016-03-06 (×3): 10 mg via ORAL
  Filled 2016-03-04 (×3): qty 1

## 2016-03-04 MED ORDER — HEPARIN (PORCINE) IN NACL 2-0.9 UNIT/ML-% IJ SOLN
INTRAMUSCULAR | Status: DC | PRN
  Administered 2016-03-04: 1000 mL

## 2016-03-04 MED ORDER — LIDOCAINE HCL (PF) 1 % IJ SOLN
INTRAMUSCULAR | Status: DC | PRN
  Administered 2016-03-04: 3 mL

## 2016-03-04 MED ORDER — SODIUM CHLORIDE 0.9 % IV SOLN: 250.0000 mL | INTRAVENOUS | Status: DC | PRN

## 2016-03-04 MED ORDER — MIDAZOLAM HCL 2 MG/2ML IJ SOLN
INTRAMUSCULAR | Status: AC
  Filled 2016-03-04: qty 2

## 2016-03-04 MED ORDER — FENTANYL CITRATE (PF) 100 MCG/2ML IJ SOLN
INTRAMUSCULAR | Status: AC
  Filled 2016-03-04: qty 2

## 2016-03-04 MED ORDER — WARFARIN - PHYSICIAN DOSING INPATIENT
Freq: Every day | Status: DC
  Administered 2016-03-04: 18:00:00

## 2016-03-04 MED ORDER — OMEGA-3-ACID ETHYL ESTERS 1 G PO CAPS
1.0000 g | ORAL_CAPSULE | Freq: Two times a day (BID) | ORAL | Status: DC
  Administered 2016-03-04 – 2016-03-06 (×4): 1 g via ORAL
  Filled 2016-03-04 (×4): qty 1

## 2016-03-04 MED ORDER — WARFARIN SODIUM 5 MG PO TABS
5.0000 mg | ORAL_TABLET | Freq: Once | ORAL | Status: AC
  Administered 2016-03-04: 5 mg via ORAL
  Filled 2016-03-04: qty 1

## 2016-03-04 MED ORDER — FENTANYL CITRATE (PF) 100 MCG/2ML IJ SOLN
INTRAMUSCULAR | Status: DC | PRN
Start: 2016-03-04 — End: 2016-03-04
  Administered 2016-03-04: 50 ug via INTRAVENOUS

## 2016-03-04 MED ORDER — AMIODARONE HCL 200 MG PO TABS
200.0000 mg | ORAL_TABLET | Freq: Two times a day (BID) | ORAL | Status: DC
  Administered 2016-03-04 – 2016-03-06 (×4): 200 mg via ORAL
  Filled 2016-03-04 (×4): qty 1

## 2016-03-04 MED ORDER — FUROSEMIDE 10 MG/ML IJ SOLN
INTRAMUSCULAR | Status: AC
  Filled 2016-03-04: qty 4

## 2016-03-04 MED ORDER — IOPAMIDOL (ISOVUE-370) INJECTION 76%
INTRAVENOUS | Status: AC
  Filled 2016-03-04: qty 100

## 2016-03-04 MED ORDER — METOPROLOL TARTRATE 5 MG/5ML IV SOLN: 2.5000 mg | INTRAVENOUS | Status: DC | PRN

## 2016-03-04 MED ORDER — ONDANSETRON HCL 4 MG/2ML IJ SOLN: 4.0000 mg | Freq: Four times a day (QID) | INTRAMUSCULAR | Status: DC | PRN

## 2016-03-04 MED ORDER — SODIUM CHLORIDE 0.9 % IV SOLN
INTRAVENOUS | Status: AC
  Administered 2016-03-04: 16:00:00 via INTRAVENOUS

## 2016-03-04 MED ORDER — FUROSEMIDE 10 MG/ML IJ SOLN
40.0000 mg | Freq: Once | INTRAMUSCULAR | Status: AC
  Administered 2016-03-04: 40 mg via INTRAVENOUS

## 2016-03-04 SURGICAL SUPPLY — 13 items
CATH INFINITI 5 FR JL3.5 (CATHETERS) ×3 IMPLANT
CATH INFINITI 5FR ANG PIGTAIL (CATHETERS) ×3 IMPLANT
CATH INFINITI JR4 5F (CATHETERS) ×3 IMPLANT
CATH LAUNCHER 5F RADL (CATHETERS) ×1 IMPLANT
CATH OPTITORQUE TIG 4.0 5F (CATHETERS) ×2 IMPLANT
CATHETER LAUNCHER 5F RADL (CATHETERS) ×3
DEVICE RAD COMP TR BAND LRG (VASCULAR PRODUCTS) ×2 IMPLANT
GLIDESHEATH SLEND SS 6F .021 (SHEATH) ×3 IMPLANT
KIT HEART LEFT (KITS) ×3 IMPLANT
PACK CARDIAC CATHETERIZATION (CUSTOM PROCEDURE TRAY) ×3 IMPLANT
TRANSDUCER W/STOPCOCK (MISCELLANEOUS) ×3 IMPLANT
TUBING CIL FLEX 10 FLL-RA (TUBING) ×3 IMPLANT
WIRE SAFE-T 1.5MM-J .035X260CM (WIRE) ×3 IMPLANT

## 2016-03-04 NOTE — Progress Notes (Signed)
TR band removed at 17:40. No bleeding or hematoma present. VSS. No c/o pain at this time. Will continue to monitor.    Ruben Reason, RN

## 2016-03-04 NOTE — Progress Notes (Signed)
Placed patient on CPAP for the night.  Patient is tolerating well at this time. 

## 2016-03-04 NOTE — H&P (View-Only) (Signed)
Called to see pt re: rapid afib  Pt seen in office 04/24 for afib, on coumadin. HR elevated, amio added. Increased D-CHF, Given short-term Lasix and K+  Echo w/ EF 30-35%, MV w/ scar and mild ischemia, cath planned.  RE: afib mgt, do not think Pt had been taking Diltiazem, med confusion.  Note says pt did not tolerate metoprolol, that was confusion as well. He thought he could not afford it, but was confusing it with Eliquis. There was no problem with the med.  He was recently changed from atenolol 100 mg bid to Coreg 3.125 mg bid. Notes do not say why, ?2nd lower EF.  This am, pt getting ready to come [facility] for cath. He bent over to put on shoes and noted SOB and tachypalpitations.   In Sec C, pt HR 140s.   Upon evaluation, pt is volume overloaded w/ basilar rales and 1-2+ LE edema. He feels he can lie flat for cath, but resp rate increases when he lies back.   BP 132/110 mmHg  Pulse 67 (but tele shows rapid rate)  Temp(Src) 98.7 F (37.1 C) (Oral)  Resp 31  Ht 5\' 11"  (1.803 m)  Wt 238 lb (107.956 kg)  BMI 33.21 kg/m2  SpO2 94%   Plan: BUN/Cr are OK. Will give Lasix 40 mg x 1, resp should improve enough to have cath. Can give more Lasix later if needed.  Will start IV Cardizem for now. Also will start metoprolol and he has IV Lopressor prn.  Cardizem is less than ideal because his EF is low, but will use temporarily for HR control.  Hopefully will improve enough for cath later today. Probably will need admission.  Lenoard Aden 03/04/2016 9:46 AM Beeper [phone]   Patient seen and examined. Agree with assessment and plan. Pt breathing better post lasix and following metoprolol 50 mg orally, Cardizem 10 mg iv bolus and currently on drip. AF rate now in the 80's; for cath today.   Troy Sine, MD, [facility] 03/04/2016 12:33 PM

## 2016-03-04 NOTE — Interval H&P Note (Signed)
Cath Lab Visit (complete for each Cath Lab visit)  Clinical Evaluation Leading to the Procedure:   ACS: No.  Non-ACS:    Anginal Classification: CCS III  Anti-ischemic medical therapy: Maximal Therapy (2 or more classes of medications)  Non-Invasive Test Results: High-risk stress test findings: cardiac mortality >3%/year  Prior CABG: No previous CABG      History and Physical Interval Note:  03/04/2016 12:36 PM  Joshua [name]  has presented today for surgery, with the diagnosis of abnormal stress test  The various methods of treatment have been discussed with the patient and family. After consideration of risks, benefits and other options for treatment, the patient has consented to  Procedure(s): Left Heart Cath and Coronary Angiography (N/A) as a surgical intervention .  The patient's history has been reviewed, patient examined, no change in status, stable for surgery.  I have reviewed the patient's chart and labs.  Questions were answered to the patient's satisfaction.     Shelva Majestic

## 2016-03-04 NOTE — Progress Notes (Signed)
ANTICOAGULATION CONSULT NOTE - Initial Consult  Pharmacy Consult for Heparin and Warfarin Indication: atrial fibrillation  No Known Allergies  Patient Measurements: Height: 5\' 11"  (180.3 cm) Weight: 238 lb (107.956 kg) IBW/kg (Calculated) : 75.3 Heparin Dosing Weight: 98 kg  Vital Signs: Temp: 98.7 F (37.1 C) (05/12 0744) Temp Source: Oral (05/12 0744) BP: 121/81 mmHg (05/12 1455) Pulse Rate: 39 (05/12 1455)  Labs:  Recent Labs  03/04/16 0800  HGB 14.8  HCT 45.5  PLT 114*  LABPROT 19.0*  INR 1.59*  CREATININE 1.00    Estimated Creatinine Clearance: 82.3 mL/min (by C-G formula based on Cr of 1).   Medical History: Past Medical History  Diagnosis Date  . Hypertension   . Hyperlipidemia   . Degenerative joint disease     Right knee  . Chronic anticoagulation   . Sleep apnea     Rx-CPAP  . Atrial fibrillation ([place]) 06/2010  . Elevated cholesterol   . Shortness of breath     on exertion  . Nocturia   . BPH (benign prostatic hyperplasia)     Medications:  Prescriptions prior to admission  Medication Sig Dispense Refill Last Dose  . amiodarone (PACERONE) 200 MG tablet Take 1 tablet (200 mg total) by mouth 2 (two) times daily. 90 tablet 3 03/04/2016 at 0600  . carvedilol (COREG) 3.125 MG tablet Take 1 tablet (3.125 mg total) by mouth 2 (two) times daily with a meal. 60 tablet 1 03/04/2016 at 0600  . lisinopril (PRINIVIL,ZESTRIL) 10 MG tablet Take 1 tablet (10 mg total) by mouth 2 (two) times daily. 180 tablet 3 03/04/2016 at Unknown time  . NONFORMULARY OR COMPOUNDED ITEM 1 application at bedtime. CPAP     . Omega-3 Fatty Acids (FISH OIL) 1000 MG CAPS Take 1,000 mg by mouth 2 (two) times daily.    03/04/2016 at 0600  . warfarin (COUMADIN) 10 MG tablet TAKE ONE TABLET BY MOUTH ONCE DAILY EXCEPT ONE-HALF TAB ON SUNDAYS, TUESDAYS, AND THURSDAYS (Patient taking differently: TAKE ONE TABLET (10 mg) BY MOUTH ONCE DAILY EXCEPT ONE-HALF TAB  (5 mg) ON SUNDAYS, TUESDAYS, AND  THURSDAYS) 45 tablet 4 02/29/2016   Scheduled:  . amiodarone  200 mg Oral BID  . aspirin      . [START ON 03/05/2016] lisinopril  10 mg Oral BID  . metoprolol tartrate  50 mg Oral BID  . omega-3 acid ethyl esters  1 g Oral BID  . sodium chloride flush  3 mL Intravenous Q12H  . warfarin  5 mg Oral ONCE-1800   Infusions:  . sodium chloride    . diltiazem (CARDIZEM) infusion 5 mg/hr (03/04/16 1021)    Assessment: 76yo male presents today for surgery. Pharmacy is consulted to dose warfarin and heparin for atrial fibrillation. INR on presentation is 1.59, Hgb 14.8, Plt 114, sCr 1.  Sheath was removed at 1343.  PTA Warfarin Dose: 5mg  Sun/Tues/Thurs and 10mg  AODs with last dose 5/8  Goal of Therapy:  INR 2-3 Heparin level 0.3-0.7 units/ml Monitor platelets by anticoagulation protocol: Yes   Plan:  Start heparin infusion at 1600 units/hr 10 hours post sheath removal Warfarin 5mg  tonight x1 Check anti-Xa level in 8 hours and daily while on heparin Continue to monitor H&H and platelets   Andrey Cota. Diona Foley, PharmD, [place] Clinical Pharmacist Pager [phone] 03/04/2016,3:35 PM

## 2016-03-04 NOTE — Progress Notes (Signed)
Called to see pt re: rapid afib  Pt seen in office 04/24 for afib, on coumadin. HR elevated, amio added. Increased D-CHF, Given short-term Lasix and K+  Echo w/ EF 30-35%, MV w/ scar and mild ischemia, cath planned.  RE: afib mgt, do not think Pt had been taking Diltiazem, med confusion.  Note says pt did not tolerate metoprolol, that was confusion as well. He thought he could not afford it, but was confusing it with Eliquis. There was no problem with the med.  He was recently changed from atenolol 100 mg bid to Coreg 3.125 mg bid. Notes do not say why, ?2nd lower EF.  This am, pt getting ready to come [facility] cath. He bent over to put on shoes and noted SOB and tachypalpitations.   In Sec C, pt HR 140s.   Upon evaluation, pt is volume overloaded w/ basilar rales and 1-2+ LE edema. He feels he can lie flat for cath, but resp rate increases when he lies back.   BP 132/110 mmHg  Pulse 67 (but tele shows rapid rate)  Temp(Src) 98.7 F (37.1 C) (Oral)  Resp 31  Ht 5\' 11"  (1.803 m)  Wt 238 lb (107.956 kg)  BMI 33.21 kg/m2  SpO2 94%   Plan: BUN/Cr are OK. Will give Lasix 40 mg x 1, resp should improve enough to have cath. Can give more Lasix later if needed.  Will start IV Cardizem for now. Also will start metoprolol and he has IV Lopressor prn.  Cardizem is less than ideal because his EF is low, but will use temporarily for HR control.  Hopefully will improve enough for cath later today. Probably will need admission.  Lenoard Aden 03/04/2016 9:46 AM Beeper [phone]   Patient seen and examined. Agree with assessment and plan. Pt breathing better post lasix and following metoprolol 50 mg orally, Cardizem 10 mg iv bolus and currently on drip. AF rate now in the 80's; for cath today.   Troy Sine, MD, [facility] 03/04/2016 12:33 PM

## 2016-03-04 NOTE — Progress Notes (Signed)
Pt to procedure area via stretcher.

## 2016-03-04 NOTE — Progress Notes (Addendum)
Received pt from Short stay alert and oriented X4.  Pt waiting  for cath procedure. Pt on bedside monitor and IVF infusing. Pt denies any discomfort at this time.  Family at bedside.  Dr Claiborne Billings in to see pt.

## 2016-03-05 DIAGNOSIS — I481 Persistent atrial fibrillation: Secondary | ICD-10-CM

## 2016-03-05 DIAGNOSIS — I110 Hypertensive heart disease with heart failure: Secondary | ICD-10-CM | POA: Diagnosis not present

## 2016-03-05 DIAGNOSIS — I480 Paroxysmal atrial fibrillation: Secondary | ICD-10-CM | POA: Diagnosis not present

## 2016-03-05 DIAGNOSIS — I429 Cardiomyopathy, unspecified: Secondary | ICD-10-CM

## 2016-03-05 DIAGNOSIS — I5023 Acute on chronic systolic (congestive) heart failure: Secondary | ICD-10-CM | POA: Diagnosis not present

## 2016-03-05 DIAGNOSIS — R9439 Abnormal result of other cardiovascular function study: Secondary | ICD-10-CM | POA: Diagnosis not present

## 2016-03-05 DIAGNOSIS — E785 Hyperlipidemia, unspecified: Secondary | ICD-10-CM | POA: Diagnosis not present

## 2016-03-05 LAB — CBC
HEMATOCRIT: 45.8 % (ref 39.0–52.0)
Hemoglobin: 14.5 g/dL (ref 13.0–17.0)
MCH: 29.8 pg (ref 26.0–34.0)
MCHC: 31.7 g/dL (ref 30.0–36.0)
MCV: 94.2 fL (ref 78.0–100.0)
PLATELETS: 109 10*3/uL — AB (ref 150–400)
RBC: 4.86 MIL/uL (ref 4.22–5.81)
RDW: 13.5 % (ref 11.5–15.5)
WBC: 4.8 10*3/uL (ref 4.0–10.5)

## 2016-03-05 LAB — BASIC METABOLIC PANEL
Anion gap: 13 (ref 5–15)
BUN: 17 mg/dL (ref 6–20)
CALCIUM: 8.8 mg/dL — AB (ref 8.9–10.3)
CO2: 23 mmol/L (ref 22–32)
Chloride: 107 mmol/L (ref 101–111)
Creatinine, Ser: 0.98 mg/dL (ref 0.61–1.24)
GFR calc Af Amer: >60 mL/min (ref >60)
GLUCOSE: 103 mg/dL — AB (ref 65–99)
Potassium: 3.7 mmol/L (ref 3.5–5.1)
Sodium: 143 mmol/L (ref 135–145)

## 2016-03-05 LAB — PROTIME-INR
INR: 1.48 (ref 0.00–1.49)
Prothrombin Time: 18 seconds — ABNORMAL HIGH (ref 11.6–15.2)

## 2016-03-05 LAB — HEPARIN LEVEL (UNFRACTIONATED)
Heparin Unfractionated: 0.3 IU/mL (ref 0.30–0.70)
Heparin Unfractionated: 0.69 IU/mL (ref 0.30–0.70)

## 2016-03-05 MED ORDER — WARFARIN - PHARMACIST DOSING INPATIENT: Freq: Every day | Status: DC

## 2016-03-05 MED ORDER — HEPARIN (PORCINE) IN NACL 100-0.45 UNIT/ML-% IJ SOLN
1600.0000 [IU]/h | INTRAMUSCULAR | Status: DC
  Administered 2016-03-06: 1600 [IU]/h via INTRAVENOUS
  Filled 2016-03-05: qty 250

## 2016-03-05 MED ORDER — WARFARIN SODIUM 5 MG PO TABS
5.0000 mg | ORAL_TABLET | Freq: Once | ORAL | Status: AC
Start: 2016-03-05 — End: 2016-03-05
  Administered 2016-03-05: 5 mg via ORAL
  Filled 2016-03-05: qty 1

## 2016-03-05 MED ORDER — METOPROLOL TARTRATE 50 MG PO TABS
75.0000 mg | ORAL_TABLET | Freq: Two times a day (BID) | ORAL | Status: DC
  Administered 2016-03-05 – 2016-03-06 (×2): 75 mg via ORAL
  Filled 2016-03-05 (×2): qty 1

## 2016-03-05 MED ORDER — METOPROLOL TARTRATE 25 MG PO TABS
25.0000 mg | ORAL_TABLET | Freq: Once | ORAL | Status: AC
  Administered 2016-03-05: 25 mg via ORAL
  Filled 2016-03-05: qty 1

## 2016-03-05 NOTE — Progress Notes (Signed)
PROGRESS NOTE  Subjective:    75 y.o. male who presents for ongoing assessment and management of atrial fibrillation, hyperlipidemia, and hypertension.  He was set up for an outpatient cath . Had A-fib with RVR in shortstay and was admitted following cath for tune up of his atrial fib.   myoview revealed an EF of 21%.    Cath revealed no critical CAD Echo form 5/3 /17 shows LVEf of 30-35%.     afib rate is better.   Still on dilt drip this am   Objective:    Vital Signs:   Temp:  [97.6 F (36.4 C)-98.8 F (37.1 C)] 97.6 F (36.4 C) (05/13 0500) Pulse Rate:  [33-102] 102 (05/13 0500) Resp:  [0-53] 0 (05/13 0600) BP: (115-152)/(78-113) 140/94 mmHg (05/13 0500) SpO2:  [42 %-98 %] 96 % (05/13 0500) Weight:  [236 lb 9.6 oz (107.321 kg)] 236 lb 9.6 oz (107.321 kg) (05/13 0500)  Last BM Date: 03/04/16   24-hour weight change: Weight change:   Weight trends: Filed Weights   03/04/16 0744 03/05/16 0500  Weight: 238 lb (107.956 kg) 236 lb 9.6 oz (107.321 kg)    Intake/Output:  05/12 0701 - 05/13 0700 In: 495 [P.O.:480; I.V.:15] Out: 3575 [Urine:3575] Total I/O In: 360 [P.O.:360] Out: 300 [Urine:300]   Physical Exam: BP 140/94 mmHg  Pulse 102  Temp(Src) 97.6 F (36.4 C) (Oral)  Resp 0  Ht 5\' 11"  (1.803 m)  Wt 236 lb 9.6 oz (107.321 kg)  BMI 33.01 kg/m2  SpO2 96%  Wt Readings from Last 3 Encounters:  03/05/16 236 lb 9.6 oz (107.321 kg)  02/19/16 242 lb (109.77 kg)  02/15/16 242 lb (109.77 kg)    General: Vital signs reviewed and noted.   Head: Normocephalic, atraumatic.  Eyes: conjunctivae/corneas clear.  EOM's intact.   Throat: normal  Neck:  normal   Lungs:    clear , reduced breath sounds   Heart:  irreg, irreg.   Abdomen:  Soft, non-tender, non-distended    Extremities: Trace edema    Neurologic: A&O X3, CN II - XII are grossly intact.   Psych: Normal     Labs: BMET:  Recent Labs  03/04/16 0800 03/05/16 0600  NA 145 143  K 4.4  3.7  CL 110 107  CO2 23 23  GLUCOSE 113* 103*  BUN 25* 17  CREATININE 1.00 0.98  CALCIUM 9.0 8.8*    Liver function tests: No results for input(s): AST, ALT, ALKPHOS, BILITOT, PROT, ALBUMIN in the last 72 hours. No results for input(s): LIPASE, AMYLASE in the last 72 hours.  CBC:  Recent Labs  03/04/16 0800 03/05/16 0600  WBC 4.6 4.8  HGB 14.8 14.5  HCT 45.5 45.8  MCV 95.2 94.2  PLT 114* 109*    Cardiac Enzymes: No results for input(s): CKTOTAL, CKMB, TROPONINI in the last 72 hours.  Coagulation Studies:  Recent Labs  03/04/16 0800 03/05/16 0600  LABPROT 19.0* 18.0*  INR 1.59* 1.48    Other: Invalid input(s): POCBNP No results for input(s): DDIMER in the last 72 hours. No results for input(s): HGBA1C in the last 72 hours. No results for input(s): CHOL, HDL, LDLCALC, TRIG, CHOLHDL in the last 72 hours. No results for input(s): TSH, T4TOTAL, T3FREE, THYROIDAB in the last 72 hours.  Invalid input(s): FREET3 No results for input(s): VITAMINB12, FOLATE, FERRITIN, TIBC, IRON, RETICCTPCT in the last 72 hours.   Other results:  Tele   ( personally reviewed )  -  atrial fib with controlled V response  Medications:    Infusions: . diltiazem (CARDIZEM) infusion 5 mg/hr (03/04/16 2335)  . heparin 1,600 Units/hr (03/04/16 2335)    Scheduled Medications: . amiodarone  200 mg Oral BID  . lisinopril  10 mg Oral BID  . metoprolol tartrate  50 mg Oral BID  . omega-3 acid ethyl esters  1 g Oral BID  . sodium chloride flush  3 mL Intravenous Q12H  . Warfarin - Physician Dosing Inpatient   Does not apply q1800    Assessment/ Plan:   Active Problems:   Atrial fibrillation (HCC)   Abnormal nuclear stress test   PAF (paroxysmal atrial fibrillation) (HCC)   Cardiomyopathy (HCC)   AF (atrial fibrillation) ([place])  1. Acute on chronic systolic CHF:   Better after diuresis and rate control. On dilt drip - will need to transition to a higher dose of  PO metoprolol    Will increase to 75 bid ( will give an extra 25 mg this am - he has already had 50 mg )  Cath did not show any significant CAd   2. Atrial fib:   On amio and metoprolol. Was on coumadin,  Currently on heparin  Pharmacy is dosing coumadin   3.    Disposition: will try to DC tomorrow  Length of Stay:   Ramond Dial., MD, [facility] 03/05/2016, 11:12 AM Office [phone] Pager [phone]

## 2016-03-05 NOTE — Progress Notes (Signed)
Patient had 10 beats V-tach last night . Patient is asymptomatic. Will continue to monitor.

## 2016-03-05 NOTE — Progress Notes (Addendum)
ANTICOAGULATION CONSULT NOTE - Follow Up Consult  Pharmacy Consult for Heparin and Warfarin Indication: atrial fibrillation  No Known Allergies  Patient Measurements: Height: 5\' 11"  (180.3 cm) Weight: 236 lb 9.6 oz (107.321 kg) IBW/kg (Calculated) : 75.3 Heparin Dosing Weight: 98 kg  Vital Signs: Temp: 97.6 F (36.4 C) (05/13 0500) Temp Source: Oral (05/13 0500) BP: 140/94 mmHg (05/13 0500) Pulse Rate: 102 (05/13 0500)  Labs:  Recent Labs  03/04/16 0800 03/05/16 0600 03/05/16 0844  HGB 14.8 14.5  --   HCT 45.5 45.8  --   PLT 114* 109*  --   LABPROT 19.0* 18.0*  --   INR 1.59* 1.48  --   HEPARINUNFRC  --   --  0.30  CREATININE 1.00 0.98  --     Estimated Creatinine Clearance: 83.7 mL/min (by C-G formula based on Cr of 0.98).   Medical History: Past Medical History  Diagnosis Date  . Hypertension   . Hyperlipidemia   . Degenerative joint disease     Right knee  . Chronic anticoagulation   . Sleep apnea     Rx-CPAP  . Atrial fibrillation ([place]) 06/2010  . Elevated cholesterol   . Shortness of breath     on exertion  . Nocturia   . BPH (benign prostatic hyperplasia)     Medications:  Prescriptions prior to admission  Medication Sig Dispense Refill Last Dose  . amiodarone (PACERONE) 200 MG tablet Take 1 tablet (200 mg total) by mouth 2 (two) times daily. 90 tablet 3 03/04/2016 at 0600  . carvedilol (COREG) 3.125 MG tablet Take 1 tablet (3.125 mg total) by mouth 2 (two) times daily with a meal. 60 tablet 1 03/04/2016 at 0600  . lisinopril (PRINIVIL,ZESTRIL) 10 MG tablet Take 1 tablet (10 mg total) by mouth 2 (two) times daily. 180 tablet 3 03/04/2016 at Unknown time  . NONFORMULARY OR COMPOUNDED ITEM 1 application at bedtime. CPAP     . Omega-3 Fatty Acids (FISH OIL) 1000 MG CAPS Take 1,000 mg by mouth 2 (two) times daily.    03/04/2016 at 0600  . warfarin (COUMADIN) 10 MG tablet TAKE ONE TABLET BY MOUTH ONCE DAILY EXCEPT ONE-HALF TAB ON SUNDAYS, TUESDAYS, AND  THURSDAYS (Patient taking differently: TAKE ONE TABLET (10 mg) BY MOUTH ONCE DAILY EXCEPT ONE-HALF TAB  (5 mg) ON SUNDAYS, TUESDAYS, AND THURSDAYS) 45 tablet 4 02/29/2016   Scheduled:  . amiodarone  200 mg Oral BID  . lisinopril  10 mg Oral BID  . metoprolol tartrate  50 mg Oral BID  . omega-3 acid ethyl esters  1 g Oral BID  . sodium chloride flush  3 mL Intravenous Q12H  . Warfarin - Physician Dosing Inpatient   Does not apply q1800   Infusions:  . diltiazem (CARDIZEM) infusion 5 mg/hr (03/04/16 2335)  . heparin 1,600 Units/hr (03/04/16 2335)    Assessment: 75yo male admitted 03/04/2016 for LHC after abnormal stress test with hx AFib (CHA2DS2VASc at least 3). INR 1.59 on admission. Pharmacy consulted to dose heparin and warfarin.  PMH AFib, HLD, HTN, HFrEF(EF 30-35%)  Day 2 heparin bridge with warfarin. HL 0.3, lower end of therapeutic range. Will increase slightly to maintain therapeutic range. INR subtherapeutic this AM at 1.48. Will resume home dose with about 25-50% dose reduction and adjust as appropriate due to new addition of amiodarone for rate control. Previously on 55 mg/week of warfarin. Hgb 14.5 stable, plt 109.      PTA Warfarin Dose: 5mg  Sun/Tues/Thurs and  10mg  AODs   Goal of Therapy:  INR 2-3 Heparin level 0.3-0.7 units/ml Monitor platelets by anticoagulation protocol: Yes   Plan:  Heparin 1650 units/hr Warfarin 5 mg tonight x1  1930 HL Daily HL, INR, CBC Monitor for s/sx bleeding   Heloise Ochoa, Pharm.D., BCPS PGY2 Cardiology Pharmacy Resident Pager: [phone] 03/05/2016,11:13 AM

## 2016-03-05 NOTE — Progress Notes (Signed)
ANTICOAGULATION CONSULT NOTE - FOLLOW UP    HL = 0.69 (goal 0.3 - 0.7 units/mL) Heparin dosing weight = 98 kg   Assessment: 74 YOM continues on IV heparin for history of Afib.  Heparin level is therapeutic and increased significantly for a small rate adjustment; no bleeding reported.  Verified with RN that lab was drawn appropriately.   Plan: - Reduce heparin gtt back to 1600 units/hr - F/U AM labs    Samaiyah Howes D. Mina Marble, PharmD, BCPS 03/05/2016, 7:29 PM

## 2016-03-06 DIAGNOSIS — I5023 Acute on chronic systolic (congestive) heart failure: Secondary | ICD-10-CM | POA: Diagnosis not present

## 2016-03-06 DIAGNOSIS — I480 Paroxysmal atrial fibrillation: Secondary | ICD-10-CM | POA: Diagnosis not present

## 2016-03-06 DIAGNOSIS — R9439 Abnormal result of other cardiovascular function study: Secondary | ICD-10-CM | POA: Diagnosis not present

## 2016-03-06 DIAGNOSIS — I110 Hypertensive heart disease with heart failure: Secondary | ICD-10-CM | POA: Diagnosis not present

## 2016-03-06 DIAGNOSIS — I509 Heart failure, unspecified: Secondary | ICD-10-CM

## 2016-03-06 DIAGNOSIS — I2510 Atherosclerotic heart disease of native coronary artery without angina pectoris: Secondary | ICD-10-CM | POA: Diagnosis present

## 2016-03-06 DIAGNOSIS — I429 Cardiomyopathy, unspecified: Secondary | ICD-10-CM | POA: Diagnosis not present

## 2016-03-06 DIAGNOSIS — E785 Hyperlipidemia, unspecified: Secondary | ICD-10-CM | POA: Diagnosis not present

## 2016-03-06 DIAGNOSIS — I481 Persistent atrial fibrillation: Secondary | ICD-10-CM | POA: Diagnosis not present

## 2016-03-06 LAB — CBC
HCT: 43.7 % (ref 39.0–52.0)
Hemoglobin: 13.9 g/dL (ref 13.0–17.0)
MCH: 29.8 pg (ref 26.0–34.0)
MCHC: 31.8 g/dL (ref 30.0–36.0)
MCV: 93.8 fL (ref 78.0–100.0)
PLATELETS: 109 10*3/uL — AB (ref 150–400)
RBC: 4.66 MIL/uL (ref 4.22–5.81)
RDW: 13.5 % (ref 11.5–15.5)
WBC: 4.6 10*3/uL (ref 4.0–10.5)

## 2016-03-06 LAB — PROTIME-INR
INR: 1.38 (ref 0.00–1.49)
Prothrombin Time: 17.1 seconds — ABNORMAL HIGH (ref 11.6–15.2)

## 2016-03-06 LAB — HEPARIN LEVEL (UNFRACTIONATED): Heparin Unfractionated: 0.65 IU/mL (ref 0.30–0.70)

## 2016-03-06 MED ORDER — ENOXAPARIN SODIUM 100 MG/ML ~~LOC~~ SOLN
100.0000 mg | Freq: Once | SUBCUTANEOUS | Status: AC
  Administered 2016-03-06: 100 mg via SUBCUTANEOUS
  Filled 2016-03-06: qty 1

## 2016-03-06 MED ORDER — WARFARIN SODIUM 10 MG PO TABS: ORAL_TABLET | ORAL | Status: DC

## 2016-03-06 MED ORDER — POTASSIUM CHLORIDE CRYS ER 20 MEQ PO TBCR
20.0000 meq | EXTENDED_RELEASE_TABLET | Freq: Every day | ORAL | Status: DC
  Administered 2016-03-06: 20 meq via ORAL
  Filled 2016-03-06: qty 1

## 2016-03-06 MED ORDER — METOPROLOL TARTRATE 75 MG PO TABS: 75.0000 mg | ORAL_TABLET | Freq: Two times a day (BID) | ORAL | Status: DC

## 2016-03-06 MED ORDER — POTASSIUM CHLORIDE CRYS ER 20 MEQ PO TBCR: 20.0000 meq | EXTENDED_RELEASE_TABLET | Freq: Every day | ORAL | Status: DC

## 2016-03-06 MED ORDER — WARFARIN SODIUM 1 MG PO TABS: 6.0000 mg | ORAL_TABLET | Freq: Once | ORAL | Status: DC

## 2016-03-06 MED ORDER — FUROSEMIDE 40 MG PO TABS
40.0000 mg | ORAL_TABLET | Freq: Every day | ORAL | Status: DC
  Administered 2016-03-06: 40 mg via ORAL
  Filled 2016-03-06: qty 1

## 2016-03-06 MED ORDER — ACETAMINOPHEN 325 MG PO TABS: 650.0000 mg | ORAL_TABLET | ORAL | Status: DC | PRN

## 2016-03-06 MED ORDER — FUROSEMIDE 40 MG PO TABS: 40.0000 mg | ORAL_TABLET | Freq: Every day | ORAL | Status: DC

## 2016-03-06 MED ORDER — ENOXAPARIN SODIUM 100 MG/ML ~~LOC~~ SOLN: 100.0000 mg | Freq: Two times a day (BID) | SUBCUTANEOUS | Status: DC

## 2016-03-06 NOTE — Progress Notes (Addendum)
PROGRESS NOTE  Subjective:    75 y.o. male who presents for ongoing assessment and management of atrial fibrillation, hyperlipidemia, and hypertension.  He was set up for an outpatient cath . Had A-fib with RVR in shortstay and was admitted following cath for tune up of his atrial fib.   myoview revealed an EF of 21%.    Cath revealed no critical CAD Echo form 5/3 /17 shows LVEf of 30-35%.     afib rate is better.      Objective:    Vital Signs:   Temp:  [97.5 F (36.4 C)-98.7 F (37.1 C)] 97.5 F (36.4 C) (05/14 0500) Pulse Rate:  [78-110] 110 (05/14 0500) Resp:  [17] 17 (05/13 2136) BP: (122-137)/(100-107) 122/100 mmHg (05/14 0500) SpO2:  [97 %-100 %] 97 % (05/14 0500) Weight:  [237 lb 14.4 oz (107.911 kg)] 237 lb 14.4 oz (107.911 kg) (05/14 0500)  Last BM Date: 03/05/16   24-hour weight change: Weight change: -1.6 oz (-0.045 kg)  Weight trends: Filed Weights   03/04/16 0744 03/05/16 0500 03/06/16 0500  Weight: 238 lb (107.956 kg) 236 lb 9.6 oz (107.321 kg) 237 lb 14.4 oz (107.911 kg)    Intake/Output:  05/13 0701 - 05/14 0700 In: 363 [P.O.:360; I.V.:3] Out: 800 [Urine:800] Total I/O In: 120 [P.O.:120] Out: 350 [Urine:350]   Physical Exam: BP 122/100 mmHg  Pulse 110  Temp(Src) 97.5 F (36.4 C) (Oral)  Resp 17  Ht 5\' 11"  (1.803 m)  Wt 237 lb 14.4 oz (107.911 kg)  BMI 33.20 kg/m2  SpO2 97%  Wt Readings from Last 3 Encounters:  03/06/16 237 lb 14.4 oz (107.911 kg)  02/19/16 242 lb (109.77 kg)  02/15/16 242 lb (109.77 kg)    General: Vital signs reviewed and noted.   Head: Normocephalic, atraumatic.  Eyes: conjunctivae/corneas clear.  EOM's intact.   Throat: normal  Neck:  normal   Lungs:    clear , reduced breath sounds   Heart:  irreg, irreg.   Abdomen:  Soft, non-tender, non-distended    Extremities: Trace edema    Neurologic: A&O X3, CN II - XII are grossly intact.   Psych: Normal     Labs: BMET:  Recent Labs   03/04/16 0800 03/05/16 0600  NA 145 143  K 4.4 3.7  CL 110 107  CO2 23 23  GLUCOSE 113* 103*  BUN 25* 17  CREATININE 1.00 0.98  CALCIUM 9.0 8.8*    Liver function tests: No results for input(s): AST, ALT, ALKPHOS, BILITOT, PROT, ALBUMIN in the last 72 hours. No results for input(s): LIPASE, AMYLASE in the last 72 hours.  CBC:  Recent Labs  03/05/16 0600 03/06/16 0611  WBC 4.8 4.6  HGB 14.5 13.9  HCT 45.8 43.7  MCV 94.2 93.8  PLT 109* 109*    Cardiac Enzymes: No results for input(s): CKTOTAL, CKMB, TROPONINI in the last 72 hours.  Coagulation Studies:  Recent Labs  03/04/16 0800 03/05/16 0600 03/06/16 0611  LABPROT 19.0* 18.0* 17.1*  INR 1.59* 1.48 1.38    Other: Invalid input(s): POCBNP No results for input(s): DDIMER in the last 72 hours. No results for input(s): HGBA1C in the last 72 hours. No results for input(s): CHOL, HDL, LDLCALC, TRIG, CHOLHDL in the last 72 hours. No results for input(s): TSH, T4TOTAL, T3FREE, THYROIDAB in the last 72 hours.  Invalid input(s): FREET3 No results for input(s): VITAMINB12, FOLATE, FERRITIN, TIBC, IRON, RETICCTPCT in the last 72 hours.   Other  results:  Tele   ( personally reviewed )  - atrial fib with controlled V response  Medications:    Infusions: . heparin 1,600 Units/hr (03/06/16 0550)    Scheduled Medications: . amiodarone  200 mg Oral BID  . furosemide  40 mg Oral Daily  . lisinopril  10 mg Oral BID  . metoprolol tartrate  75 mg Oral BID  . omega-3 acid ethyl esters  1 g Oral BID  . potassium chloride  20 mEq Oral Daily  . sodium chloride flush  3 mL Intravenous Q12H  . warfarin  6 mg Oral ONCE-1800  . Warfarin - Pharmacist Dosing Inpatient   Does not apply [id number]    Assessment/ Plan:   Active Problems:   Atrial fibrillation (HCC)   Abnormal nuclear stress test   PAF (paroxysmal atrial fibrillation) (HCC)   Cardiomyopathy (HCC)   AF (atrial fibrillation) ([place])  1. Acute on chronic  systolic CHF:   Better after diuresis and rate control. On dilt drip - will need to transition to a higher dose of  PO metoprolol  Will increase to 75 bid ( will give an extra 25 mg this am - he has already had 50 mg )  Cath did not show any significant CAd BP is still high. Will add Lasix 40 QD and Kdur 20 meq QD to his medical regimin.    2. Atrial fib:   On amio and metoprolol. Is on  coumadin,  Currently on heparin  Pharmacy is dosing coumadin  OK to DC to home .  Lets give Lovanox bridge for 3 days. I've asked Kristeen Mans, ( pharmacy resident ) to help with dosing . Have him check his INR on Wed. ( in [place]. Coumadin clinic )  Follow up with Dr. Harl Bowie    Disposition: ok to DC today    Length of Stay:   Ramond Dial., MD, [facility] 03/06/2016, 10:38 AM Office [phone] Pager [phone]

## 2016-03-06 NOTE — Discharge Instructions (Signed)
Have your INR checked Wed 5/17  Atrial Fibrillation Atrial fibrillation is a type of heartbeat that is irregular or fast (rapid). If you have this condition, your heart keeps quivering in a weird (chaotic) way. This condition can make it so your heart cannot pump blood normally. Having this condition gives a person more risk for stroke, heart failure, and other heart problems. There are different types of atrial fibrillation. Talk with your doctor to learn about the type that you have. HOME CARE 1. Take over-the-counter and prescription medicines only as told by your doctor. 2. If your doctor prescribed a blood-thinning medicine, take it exactly as told. Taking too much of it can cause bleeding. If you do not take enough of it, you will not have the protection that you need against stroke and other problems. 3. Do not use any tobacco products. These include cigarettes, chewing tobacco, and e-cigarettes. If you need help quitting, ask your doctor. 4. If you have apnea (obstructive sleep apnea), manage it as told by your doctor. 5. Do not drink alcohol. 6. Do not drink beverages that have caffeine. These include coffee, soda, and tea. 7. Maintain a healthy weight. Do not use diet pills unless your doctor says they are safe for you. Diet pills may make heart problems worse. 8. Follow diet instructions as told by your doctor. 9. Exercise regularly as told by your doctor. 10. Keep all follow-up visits as told by your doctor. This is important. GET HELP IF:  You notice a change in the speed, rhythm, or strength of your heartbeat.  You are taking a blood-thinning medicine and you notice more bruising.  You get tired more easily when you move or exercise. GET HELP RIGHT AWAY IF:  You have pain in your chest or your belly (abdomen).  You have sweating or weakness.  You feel sick to your stomach (nauseous).  You notice blood in your throw up (vomit), poop (stool), or pee (urine).  You are short  of breath.  You suddenly have swollen feet and ankles.  You feel dizzy.  Your suddenly get weak or numb in your face, arms, or legs, especially if it happens on one side of your body.  You have trouble talking, trouble understanding, or both.  Your face or your eyelid droops on one side. These symptoms may be an emergency. Do not wait to see if the symptoms will go away. Get medical help right away. Call your local emergency services (911 in the [place] Do not drive yourself to the hospital.   This information is not intended to replace advice given to you by your health care provider. Make sure you discuss any questions you have with your health care provider.   Document Released: 07/19/2008 Document Revised: 07/01/2015 Document Reviewed: 02/04/2015 Elsevier Interactive Patient Education 2016 Elsevier Inc[employer] Enoxaparin injection What is this medicine? ENOXAPARIN (ee nox a PA rin) is used after knee, hip, or abdominal surgeries to prevent blood clotting. It is also used to treat existing blood clots in the lungs or in the veins. This medicine may be used for other purposes; ask your health care provider or pharmacist if you have questions. What should I tell my health care provider before I take this medicine? They need to know if you have any of these conditions: -bleeding disorders, hemorrhage, or hemophilia -infection of the heart or heart valves -kidney or liver disease -previous stroke -prosthetic heart valve -recent surgery or delivery of a baby -ulcer in the stomach or  intestine, diverticulitis, or other bowel disease -an unusual or allergic reaction to enoxaparin, heparin, pork or pork products, other medicines, foods, dyes, or preservatives -pregnant or trying to get pregnant -breast-feeding How should I use this medicine? This medicine is for injection under the skin. It is usually given by a health-care professional. You or a family member may be trained on how to give the  injections. If you are to give yourself injections, make sure you understand how to use the syringe, measure the dose if necessary, and give the injection. To avoid bruising, do not rub the site where this medicine has been injected. Do not take your medicine more often than directed. Do not stop taking except on the advice of your doctor or health care professional. Make sure you receive a puncture-resistant container to dispose of the needles and syringes once you have finished with them. Do not reuse these items. Return the container to your doctor or health care professional for proper disposal. Talk to your pediatrician regarding the use of this medicine in children. Special care may be needed. Overdosage: If you think you have taken too much of this medicine contact a poison control center or emergency room at once. NOTE: This medicine is only for you. Do not share this medicine with others. What if I miss a dose? If you miss a dose, take it as soon as you can. If it is almost time for your next dose, take only that dose. Do not take double or extra doses. What may interact with this medicine? -aspirin and aspirin-like medicines -certain medicines that treat or prevent blood clots -dipyridamole -NSAIDs, medicines for pain and inflammation, like ibuprofen or naproxen This list may not describe all possible interactions. Give your health care provider a list of all the medicines, herbs, non-prescription drugs, or dietary supplements you use. Also tell them if you smoke, drink alcohol, or use illegal drugs. Some items may interact with your medicine. What should I watch for while using this medicine? Visit your doctor or health care professional for regular checks on your progress. Your condition will be monitored carefully while you are receiving this medicine. Notify your doctor or health care professional and seek emergency treatment if you develop breathing problems; changes in vision; chest  pain; severe, sudden headache; pain, swelling, warmth in the leg; trouble speaking; sudden numbness or weakness of the face, arm, or leg. These can be signs that your condition has gotten worse. If you are going to have surgery, tell your doctor or health care professional that you are taking this medicine. Do not stop taking this medicine without first talking to your doctor. Be sure to refill your prescription before you run out of medicine. Avoid sports and activities that might cause injury while you are using this medicine. Severe falls or injuries can cause unseen bleeding. Be careful when using sharp tools or knives. Consider using an Copy. Take special care brushing or flossing your teeth. Report any injuries, bruising, or red spots on the skin to your doctor or health care professional. What side effects may I notice from receiving this medicine? Side effects that you should report to your doctor or health care professional as soon as possible: -allergic reactions like skin rash, itching or hives, swelling of the face, lips, or tongue -feeling faint or lightheaded, falls -signs and symptoms of bleeding such as bloody or black, tarry stools; red or dark-brown urine; spitting up blood or brown material that looks like coffee grounds;  red spots on the skin; unusual bruising or bleeding from the eye, gums, or nose Side effects that usually do not require medical attention (report to your doctor or health care professional if they continue or are bothersome): -pain, redness, or irritation at site where injected This list may not describe all possible side effects. Call your doctor for medical advice about side effects. You may report side effects to FDA at 1-800-FDA-1088. Where should I keep my medicine? Keep out of the reach of children. Store at room temperature between 15 and 30 degrees C (59 and 86 degrees F). Do not freeze. If your injections have been specially prepared, you may  need to store them in the refrigerator. Ask your pharmacist. Throw away any unused medicine after the expiration date. NOTE: This sheet is a summary. It may not cover all possible information. If you have questions about this medicine, talk to your doctor, pharmacist, or health care provider.    2016, Elsevier/Gold Standard. (2014-02-11 16:06:21) How and Where to Give Subcutaneous Enoxaparin Injections Enoxaparin is an injectable medicine. It is used to help prevent blood clots from developing in your veins. Health care providers often use anticoagulants like enoxaparin to prevent clots following surgery. Enoxaparin is also used in combination with other medicines to treat blood clots and heart attacks. If blood clots are left untreated, they can be life threatening.  Enoxaparin comes in single-use syringes. You inject enoxaparin through a syringe into your belly (abdomen). You should change the injection site each time you give yourself a shot. Continue the enoxaparin injections as directed by your health care provider. Your health care provider will use blood clotting test results to decide when you can safely stop using enoxaparin injections. If your health care provider prescribes any additional medicines, use the medicines exactly as directed. HOW DO I INJECT ENOXAPARIN?  11. Wash your hands with soap and water. 12. Clean the selected injection site as directed by your health care provider. 13. Remove the needle cap by pulling it straight off the syringe. 14. Hold the syringe like a pencil using your writing hand. 15. Use your other hand to pinch and hold an inch of the cleansed skin. 16. Insert the entire needle straight down into the fold of skin. 17. Push the plunger with your thumb until the syringe is empty. 18. Pull the needle straight out of your skin. 19. Enoxaparin injection prefilled syringes and graduated prefilled syringes are available with a system that shields the needle after  injection. After you have completed your injection and removed the needle from your skin, firmly push down on the plunger. The protective sleeve will automatically cover the needle and you will hear a click. The click means the needle is safely covered. 20. Place the syringe in the nearest needle box, also called a sharps container. If you do not have a sharps container, you can use a hard-sided plastic container with a secure lid, such as an empty laundry detergent bottle. WHAT ELSE DO I NEED TO KNOW?  Do not use enoxaparin if:  You have allergies to heparin or pork products.  You have been diagnosed with a condition called thrombocytopenia.  Do not use the syringe or needle more than one time.  Use medicines only as directed by your health care provider.  Changes in medicines, supplements, diet, and illness can affect your anticoagulation therapy. Be sure to inform your health care provider of any of these changes.  It is important that you tell all  of your health care providers and your dentist that you are taking an anticoagulant, especially if you are injured or plan to have any type of procedure.  While on anticoagulants, you will need to have blood tests done routinely as directed by your health care provider.  While using this medicine, avoid physical activities or sports that could result in a fall or cause injury.  Follow up with your laboratory test and health care provider appointments as directed. It is very important to keep your appointments. Not keeping appointments could result in a chronic or permanent injury, pain, or disability.  Before giving your medicine, you should make sure the injection is a clear and colorless or pale yellow solution. If your medicine becomes discolored or if there are particles in the syringe, do not use it and notify your health care provider.  Keep your medicine safely stored at room temperature. SEEK MEDICAL CARE IF:  You develop any rashes  on your skin.  You have large areas of bruising on your skin.  You have any worsening of the condition for which you take Enoxaparin.  You develop a fever. SEEK IMMEDIATE MEDICAL CARE IF:  You develop bleeding problems such as:  Bleeding from the gums or nose that does not stop quickly.  Vomiting blood or coughing up blood.  Blood in your urine.  Blood in your stool, or stool that has a dark, tarry, or coffee grounds appearance.  A cut that does not stop bleeding within 10 minutes. These symptoms may represent a serious problem that is an emergency. Do not wait to see if the symptoms will go away. Get medical help right away. Call your local emergency services (911 in the [place] Do not drive yourself to the hospital.    This information is not intended to replace advice given to you by your health care provider. Make sure you discuss any questions you have with your health care provider.   Document Released: 08/11/2004 Document Revised: 10/31/2014 Document Reviewed: 03/27/2014 Elsevier Interactive Patient Education [employer].

## 2016-03-06 NOTE — Discharge Summary (Signed)
Patient ID: Joshua [name],  MRN: [id number], DOB/AGE: Sep 04, 1941 75 y.o.  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 He has had increasing shortness of breath. He has been started on amiodarone as an OP for his AF and has been on Coumadin. His CHADs VASc score is 4. A nuclear study done 02/24/16 was high risk and showed an ejection fraction of 21% with possible scar without associated ischemia. Echo on 02/24/16 showed his EF to be 30%.  He was referred for cardiac catheterization. His Coumadin was held for several days pre cath. When the pt was seen in the holding area pre cath he was in atrial fibrillation with a rapid response. He was felt to be volume overloaded. He was given oral beta blocker therapy and ultimately required IV Cardizem bolus plus infusion for rate control. He received Lasix 40 mg IV. Once his AF, rate was controlled he was brought to the catheterization laboratory for the procedure. His cath showed minor CAD. He was transferred to telemetry and his medications adjusted. Dr Acie Fredrickson saw the pt on 03/06/16 and feels he can be discharged with Lovenox to Coumadin bridging. He'll need an OV in a week or two to decrease his Amiodarone and he'll need an INR next week.   Discharge Vitals:  Blood pressure 122/100, pulse 110, temperature 97.5 F (36.4 C), temperature source Oral, resp. rate 17, height 5\' 11"  (1.803 m), weight 237 lb 14.4 oz (107.911 kg), SpO2  97 %.    Labs: Results for orders placed or performed during the hospital encounter of 03/04/16 (from the past 24 hour(s))  Heparin level (unfractionated)     Status: None   Collection Time: 03/05/16  6:24 PM  Result Value Ref Range   Heparin Unfractionated 0.69 0.30 - 0.70 IU/mL  Heparin level (unfractionated)     Status: None   Collection Time: 03/06/16  6:11 AM  Result Value Ref Range   Heparin Unfractionated 0.65 0.30 - 0.70 IU/mL  CBC     Status: Abnormal   Collection Time: 03/06/16  6:11 AM  Result Value Ref Range   WBC 4.6 4.0 - 10.5 K/uL   RBC 4.66 4.22 - 5.81 MIL/uL   Hemoglobin 13.9 13.0 - 17.0 g/dL   HCT 43.7 39.0 - 52.0 %   MCV 93.8 78.0 - 100.0 fL   MCH 29.8 26.0 - 34.0 pg   MCHC 31.8 30.0 - 36.0 g/dL   RDW 13.5 11.5 - 15.5 %   Platelets 109 (L) 150 - 400 K/uL  Protime-INR     Status: Abnormal   Collection Time: 03/06/16  6:11 AM  Result Value Ref Range   Prothrombin Time 17.1 (H) 11.6 - 15.2 seconds   INR 1.38 0.00 - 1.49    Disposition:      Follow-up Information    Follow up with Carlyle Dolly, MD.   Specialty:  Cardiology   Why:  office will conatct you   Contact information:   [address] [place] [address] [phone]       Discharge Medications:    Medication List    STOP taking these medications        carvedilol 3.125 MG tablet  Commonly known as:  COREG      TAKE these medications        acetaminophen 325 MG tablet  Commonly known as:  TYLENOL  Take 2 tablets (650 mg total) by mouth every 4 (four) hours as needed for headache or mild pain.     amiodarone 200 MG tablet  Commonly known as:  PACERONE  Take 1 tablet (200 mg total) by mouth 2 (two) times daily.     enoxaparin 100 MG/ML injection  Commonly known as:  LOVENOX  Inject 1 mL (100 mg total) into the skin every 12 (twelve) hours.     Fish Oil 1000 MG Caps  Take 1,000 mg by mouth 2 (two) times daily.     furosemide 40 MG tablet  Commonly known as:  LASIX    Take 1 tablet (40 mg total) by mouth daily.     lisinopril 10 MG tablet  Commonly known as:  PRINIVIL,ZESTRIL  Take 1 tablet (10 mg total) by mouth 2 (two) times daily.     Metoprolol Tartrate 75 MG Tabs  Take 75 mg by mouth 2 (two) times daily.     NONFORMULARY OR COMPOUNDED ITEM  1 application at bedtime. CPAP     potassium chloride SA 20 MEQ tablet  Commonly known as:  K-DUR,KLOR-CON  Take 1 tablet (20 mEq total) by mouth daily.     warfarin 10 MG tablet  Commonly known as:  COUMADIN  TAKE ONE TABLET (10 mg) BY MOUTH ONCE DAILY EXCEPT ONE-HALF TAB  (5 mg) ON SUNDAYS, TUESDAYS, AND THURSDAYS         Duration of Discharge Encounter: Greater than 30 minutes including physician time.  Angelena Form PA-C 03/06/2016 11:34 AM

## 2016-03-06 NOTE — Progress Notes (Signed)
ANTICOAGULATION CONSULT NOTE - Follow Up Consult  Pharmacy Consult for Heparin and Warfarin Indication: atrial fibrillation  No Known Allergies  Patient Measurements: Height: 5\' 11"  (180.3 cm) Weight: 237 lb 14.4 oz (107.911 kg) IBW/kg (Calculated) : 75.3 Heparin Dosing Weight: 98 kg  Vital Signs: Temp: 97.5 F (36.4 C) (05/14 0500) Temp Source: Oral (05/14 0500) BP: 122/100 mmHg (05/14 0500) Pulse Rate: 110 (05/14 0500)  Labs:  Recent Labs  03/04/16 0800 03/05/16 0600 03/05/16 0844 03/05/16 1824 03/06/16 0611  HGB 14.8 14.5  --   --  13.9  HCT 45.5 45.8  --   --  43.7  PLT 114* 109*  --   --  109*  LABPROT 19.0* 18.0*  --   --  17.1*  INR 1.59* 1.48  --   --  1.38  HEPARINUNFRC  --   --  0.30 0.69 0.65  CREATININE 1.00 0.98  --   --   --     Estimated Creatinine Clearance: 83.8 mL/min (by C-G formula based on Cr of 0.98).   Medical History: Past Medical History  Diagnosis Date  . Hypertension   . Hyperlipidemia   . Degenerative joint disease     Right knee  . Chronic anticoagulation   . Sleep apnea     Rx-CPAP  . Atrial fibrillation ([place]) 06/2010  . Elevated cholesterol   . Shortness of breath     on exertion  . Nocturia   . BPH (benign prostatic hyperplasia)     Medications:  Prescriptions prior to admission  Medication Sig Dispense Refill Last Dose  . amiodarone (PACERONE) 200 MG tablet Take 1 tablet (200 mg total) by mouth 2 (two) times daily. 90 tablet 3 03/04/2016 at 0600  . carvedilol (COREG) 3.125 MG tablet Take 1 tablet (3.125 mg total) by mouth 2 (two) times daily with a meal. 60 tablet 1 03/04/2016 at 0600  . lisinopril (PRINIVIL,ZESTRIL) 10 MG tablet Take 1 tablet (10 mg total) by mouth 2 (two) times daily. 180 tablet 3 03/04/2016 at Unknown time  . NONFORMULARY OR COMPOUNDED ITEM 1 application at bedtime. CPAP     . Omega-3 Fatty Acids (FISH OIL) 1000 MG CAPS Take 1,000 mg by mouth 2 (two) times daily.    03/04/2016 at 0600  . warfarin  (COUMADIN) 10 MG tablet TAKE ONE TABLET BY MOUTH ONCE DAILY EXCEPT ONE-HALF TAB ON SUNDAYS, TUESDAYS, AND THURSDAYS (Patient taking differently: TAKE ONE TABLET (10 mg) BY MOUTH ONCE DAILY EXCEPT ONE-HALF TAB  (5 mg) ON SUNDAYS, TUESDAYS, AND THURSDAYS) 45 tablet 4 02/29/2016   Scheduled:  . amiodarone  200 mg Oral BID  . lisinopril  10 mg Oral BID  . metoprolol tartrate  75 mg Oral BID  . omega-3 acid ethyl esters  1 g Oral BID  . sodium chloride flush  3 mL Intravenous Q12H  . Warfarin - Pharmacist Dosing Inpatient   Does not apply q1800   Infusions:  . heparin 1,600 Units/hr (03/06/16 0550)    Assessment: 75yo male admitted 03/04/2016 for LHC after abnormal stress test with hx AFib (CHA2DS2VASc at least 3). INR 1.59 on admission. Pharmacy consulted to dose heparin and warfarin.  PMH AFib, HLD, HTN, HFrEF (EF 30-35%)  Day 3 heparin bridge with warfarin. HL 0.65 therapeutic. INR continues to be subtherapeutic this AM at 1.38.  Hgb 13.9, plt 109 stable. Of note, recently started on amio for rate control and will adjust warfarin accordingly. Previously on 55 mg/week of warfarin.  PTA Warfarin Dose: 5mg  Sun/Tues/Thurs and 10mg  AODs   Goal of Therapy:  INR 2-3 Heparin level 0.3-0.7 units/ml Monitor platelets by anticoagulation protocol: Yes   Plan:  Heparin 1600 units/hr Warfarin 6 mg tonight x1  Daily HL, INR, CBC Monitor for s/sx bleeding   Heloise Ochoa, Pharm.D., BCPS PGY2 Cardiology Pharmacy Resident Pager: [phone] 03/06/2016,9:45 AM

## 2016-03-07 ENCOUNTER — Encounter (HOSPITAL_COMMUNITY): Payer: Self-pay | Admitting: Cardiovascular Disease

## 2016-03-08 DIAGNOSIS — H524 Presbyopia: Secondary | ICD-10-CM | POA: Diagnosis not present

## 2016-03-08 DIAGNOSIS — H2513 Age-related nuclear cataract, bilateral: Secondary | ICD-10-CM | POA: Diagnosis not present

## 2016-03-08 DIAGNOSIS — H52203 Unspecified astigmatism, bilateral: Secondary | ICD-10-CM | POA: Diagnosis not present

## 2016-03-08 DIAGNOSIS — H25013 Cortical age-related cataract, bilateral: Secondary | ICD-10-CM | POA: Diagnosis not present

## 2016-03-09 ENCOUNTER — Ambulatory Visit (INDEPENDENT_AMBULATORY_CARE_PROVIDER_SITE_OTHER): Payer: Medicare Other | Admitting: *Deleted

## 2016-03-09 DIAGNOSIS — Z7901 Long term (current) use of anticoagulants: Secondary | ICD-10-CM

## 2016-03-09 DIAGNOSIS — I4891 Unspecified atrial fibrillation: Secondary | ICD-10-CM

## 2016-03-09 DIAGNOSIS — Z5181 Encounter for therapeutic drug level monitoring: Secondary | ICD-10-CM | POA: Diagnosis not present

## 2016-03-09 LAB — POCT INR: INR: 1.6

## 2016-03-09 MED ORDER — ENOXAPARIN SODIUM 100 MG/ML ~~LOC~~ SOLN: 100.0000 mg | Freq: Two times a day (BID) | SUBCUTANEOUS | Status: DC

## 2016-03-11 ENCOUNTER — Ambulatory Visit (INDEPENDENT_AMBULATORY_CARE_PROVIDER_SITE_OTHER): Payer: Medicare Other | Admitting: Pharmacist

## 2016-03-11 ENCOUNTER — Ambulatory Visit (INDEPENDENT_AMBULATORY_CARE_PROVIDER_SITE_OTHER): Payer: Medicare Other | Admitting: Adult Health

## 2016-03-11 ENCOUNTER — Encounter: Payer: Self-pay | Admitting: Adult Health

## 2016-03-11 VITALS — BP 114/84 | HR 67 | Ht 71.0 in | Wt 232.0 lb

## 2016-03-11 DIAGNOSIS — I420 Dilated cardiomyopathy: Secondary | ICD-10-CM | POA: Diagnosis not present

## 2016-03-11 DIAGNOSIS — I481 Persistent atrial fibrillation: Secondary | ICD-10-CM | POA: Diagnosis not present

## 2016-03-11 DIAGNOSIS — Z7901 Long term (current) use of anticoagulants: Secondary | ICD-10-CM

## 2016-03-11 DIAGNOSIS — I4891 Unspecified atrial fibrillation: Secondary | ICD-10-CM

## 2016-03-11 DIAGNOSIS — Z79899 Other long term (current) drug therapy: Secondary | ICD-10-CM | POA: Diagnosis not present

## 2016-03-11 DIAGNOSIS — I4819 Other persistent atrial fibrillation: Secondary | ICD-10-CM

## 2016-03-11 DIAGNOSIS — Z5181 Encounter for therapeutic drug level monitoring: Secondary | ICD-10-CM

## 2016-03-11 DIAGNOSIS — I10 Essential (primary) hypertension: Secondary | ICD-10-CM | POA: Diagnosis not present

## 2016-03-11 LAB — POCT INR: INR: 1.9

## 2016-03-11 MED ORDER — AMIODARONE HCL 200 MG PO TABS: 200.0000 mg | ORAL_TABLET | Freq: Every day | ORAL | Status: DC

## 2016-03-11 NOTE — Progress Notes (Addendum)
Cardiology Office Note   Date:  04/04/2016   ID:  Joshua [name], DOB 1941/06/13, MRN [id number]  PCP:  Asencion Noble, MD  Cardiologist: Cloria Spring, NP   Chief Complaint  Patient presents with  . Atrial Fibrillation    History of Present Illness: Joshua [name] is a 75 y.o. male who presents for in management atrial fibrillation, CHF, nonischemic cardiomyopathy, coronary artery disease,who was recently discharged on 03/06/2016 after admission for A. Fib RVR hypertension, who developed CHF has resolved. He had an abnormal MPI which revealed an EF of 21% and possible scar without associated ischemia. Followup echocardiogram on 02/24/2016 showed EF of 30%.  He was sent for cardiac catheterization after holding Coumadin. In precath area he went into atrial fibrillation with RVR. Found to have fluid overload.Marland Kitchen He was treated with IV Lasix and diltiazem. Once his heart rate was controlled he proceeded to catheterization. The cath showed only minor CAD. He was sent home on Lovenox bridging to Coumadin. He was placed on amiodarone.and will need to decrease to 200 mg daily from 200 mg twice a day after one week.INR should be checked as well.Marland Kitchen  He comes today feeling much better breathing better denies any palpitations lower extremity edema or bleeding. He is tolerating Lasix and is not having any muscle aches or cramping. He is also tolerating amiodarone without issues with wheezing.  Past Medical History  Diagnosis Date  . Hypertension   . Hyperlipidemia   . Degenerative joint disease     Right knee  . Chronic anticoagulation   . Sleep apnea     Rx-CPAP  . Atrial fibrillation ([place]) 06/2010  . Elevated cholesterol   . Shortness of breath     on exertion  . Nocturia   . BPH (benign prostatic hyperplasia)     Past Surgical History  Procedure Laterality Date  . Ventral hernia repair  03/2008, 02/2009  . Colonoscopy  03/10/2005  . Total knee arthroplasty Right 01/14/2013   Procedure: TOTAL KNEE ARTHROPLASTY;  Surgeon: Gearlean Alf, MD;  Location: [facility];  Service: Orthopedics;  Laterality: Right;  . Total knee arthroplasty Left 03/10/2014    Procedure: LEFT TOTAL KNEE ARTHROPLASTY;  Surgeon: Gearlean Alf, MD;  Location: [facility];  Service: Orthopedics;  Laterality: Left;  . Cardiac catheterization N/A 03/04/2016    Procedure: Left Heart Cath and Coronary Angiography;  Surgeon: Troy Sine, MD;  Location: [facility];  Service: Cardiovascular;  Laterality: N/A;     Current Outpatient Prescriptions  Medication Sig Dispense Refill  . amiodarone (PACERONE) 200 MG tablet Take 1 tablet (200 mg total) by mouth daily. 90 tablet 3  . enoxaparin (LOVENOX) 100 MG/ML injection Inject 1 mL (100 mg total) into the skin every 12 (twelve) hours. 4 Syringe 0  . furosemide (LASIX) 40 MG tablet Take 1 tablet (40 mg total) by mouth daily. 30 tablet 11  . lisinopril (PRINIVIL,ZESTRIL) 10 MG tablet Take 1 tablet (10 mg total) by mouth 2 (two) times daily. 180 tablet 3  . Metoprolol Tartrate 75 MG TABS Take 75 mg by mouth 2 (two) times daily. 60 tablet 11  . NONFORMULARY OR COMPOUNDED ITEM 1 application at bedtime. CPAP    . Omega-3 Fatty Acids (FISH OIL) 1000 MG CAPS Take 1,000 mg by mouth 2 (two) times daily.     . potassium chloride SA (K-DUR,KLOR-CON) 20 MEQ tablet Take 1 tablet (20 mEq total) by mouth daily. (Patient not taking: Reported on 03/11/2016)  30 tablet 11  . warfarin (COUMADIN) 10 MG tablet TAKE ONE TABLET (10 mg) BY MOUTH ONCE DAILY EXCEPT ONE-HALF TAB  (5 mg) ON SUNDAYS, TUESDAYS, AND THURSDAYS 45 tablet 4   No current facility-administered medications for this visit.    Allergies:   Review of patient's allergies indicates no known allergies.    Social History:  The patient  reports that he has been smoking Cigars.  He has never used smokeless tobacco. He reports that he does not drink alcohol or use illicit drugs.   Family History:  The patient's  family history includes Heart attack in his father.    ROS: All other systems are reviewed and negative. Unless otherwise mentioned in H&P    PHYSICAL EXAM: VS:  BP 114/84 mmHg  Pulse 67  Ht 5\' 11"  (1.803 m)  Wt 232 lb (105.235 kg)  BMI 32.37 kg/m2  SpO2 99% , BMI Body mass index is 32.37 kg/(m^2). GEN: Well nourished, well developed, in no acute distress HEENT: normal Neck: no JVD, carotid bruits, or masses Cardiac:RRR; no murmurs, rubs, or gallops,no edema  Respiratory:  clear to auscultation bilaterally, normal work of breathing GI: soft, nontender, nondistended, + BS MS: no deformity or atrophy Skin: warm and dry, no rash Neuro:  Strength and sensation are intact Psych: euthymic mood, full affect  Recent Labs: 02/15/2016: Brain Natriuretic Peptide 899.0* 03/06/2016: Hemoglobin 13.9; Platelets 109* 04/01/2016: BUN 21; Creat 1.35*; Potassium 4.6; Sodium 142    Lipid Panel    Component Value Date/Time   CHOL 187 11/28/2012 0830   TRIG 286* 11/28/2012 0830   HDL 30* 11/28/2012 0830   CHOLHDL 6.2 11/28/2012 0830   VLDL 57* 11/28/2012 0830   LDLCALC 100* 11/28/2012 0830      Wt Readings from Last 3 Encounters:  03/11/16 232 lb (105.235 kg)  03/06/16 237 lb 14.4 oz (107.911 kg)  02/19/16 242 lb (109.77 kg)    ASSESSMENT AND PLAN:  1. Atrial fibrillation: heart rate slightly elevated today in the office. He remains on amiodarone 200 mg twice a day. We'll decrease to 200 mg daily in 3 days.  He continues on Lovenox bridging with INR checked today in the office at 1.9. He continues on Coumadin therapy 10 mg by mouth once daily with the exception of a half a tablet on Sundays Tuesdays and Thursdays. He admits to eating a lot of broccoli and dark green vegetables. Stating no one told him not to. INR results have been sent to [facility] heart care pharmacist with recommendations on dosing.they have been informed in amiodarone dose will be decreased in 3 days to 200 mg daily.  2.  Dyspnea: Improved with by mouth Lasix.he is taking potassium supplements. I've asked him to resume this. He will followup with [place] in one month with a BMET prior to being seen.  3. Hypertension:blood pressure control. Actually a little on the soft side.LV gram was not completed during catheterization, but recent echo revealed EF of 30-35%.. Soft blood pressure is acceptable in this setting  Current medicines are reviewed at length with the patient today.    Labs/ tests ordered today include: BMET 1 month  Orders Placed This Encounter  Procedures  . Basic Metabolic Panel (BMET)     Disposition:   FU with 1 month Dr. Harl Bowie   Signed, Jory Sims, NP  04/04/2016 7:03 AM    [facility]. [address], [place], [place] [address] Phone: [phone]; Fax: [phone]

## 2016-03-11 NOTE — Progress Notes (Deleted)
Name: Joshua [name]    DOB: 1941-07-09  Age: 75 y.o.  MR#: [id number]       PCP:  Asencion Noble, MD      Insurance: Payor: MEDICARE / Plan: MEDICARE PART A AND B / Product Type: *No Product type* /   CC:    Chief Complaint  Patient presents with  . Atrial Fibrillation    VS Filed Vitals:   03/11/16 1258  BP: 114/84  Pulse: 67  Height: 5\' 11"  (1.803 m)  Weight: 232 lb (105.235 kg)  SpO2: 99%    Weights Current Weight  03/11/16 232 lb (105.235 kg)  03/06/16 237 lb 14.4 oz (107.911 kg)  02/19/16 242 lb (109.77 kg)    Blood Pressure  BP Readings from Last 3 Encounters:  03/11/16 114/84  03/06/16 122/100  02/19/16 138/74     Admit date:  (Not on file) Last encounter with RMR:  02/19/2016   Allergy Review of patient's allergies indicates no known allergies.  Current Outpatient Prescriptions  Medication Sig Dispense Refill  . amiodarone (PACERONE) 200 MG tablet Take 1 tablet (200 mg total) by mouth 2 (two) times daily. 90 tablet 3  . enoxaparin (LOVENOX) 100 MG/ML injection Inject 1 mL (100 mg total) into the skin every 12 (twelve) hours. 4 Syringe 0  . furosemide (LASIX) 40 MG tablet Take 1 tablet (40 mg total) by mouth daily. 30 tablet 11  . lisinopril (PRINIVIL,ZESTRIL) 10 MG tablet Take 1 tablet (10 mg total) by mouth 2 (two) times daily. 180 tablet 3  . Metoprolol Tartrate 75 MG TABS Take 75 mg by mouth 2 (two) times daily. 60 tablet 11  . NONFORMULARY OR COMPOUNDED ITEM 1 application at bedtime. CPAP    . Omega-3 Fatty Acids (FISH OIL) 1000 MG CAPS Take 1,000 mg by mouth 2 (two) times daily.     . potassium chloride SA (K-DUR,KLOR-CON) 20 MEQ tablet Take 1 tablet (20 mEq total) by mouth daily. (Patient not taking: Reported on 03/11/2016) 30 tablet 11  . warfarin (COUMADIN) 10 MG tablet TAKE ONE TABLET (10 mg) BY MOUTH ONCE DAILY EXCEPT ONE-HALF TAB  (5 mg) ON SUNDAYS, TUESDAYS, AND THURSDAYS 45 tablet 4   No current facility-administered medications for this visit.     Discontinued Meds:    Medications Discontinued During This Encounter  Medication Reason  . acetaminophen (TYLENOL) 325 MG tablet Error    Patient Active Problem List   Diagnosis Date Noted  . CAD- minor CAD at cath 03/04/16 03/06/2016  . Acute CHF - secondary to AF with RVR 03/06/2016  . AF (atrial fibrillation) ([place]) 03/04/2016  . Abnormal nuclear stress test   . PAF (paroxysmal atrial fibrillation) ([place])   . Non-ischemic cardiomyopathy ([place])   . Edema 03/31/2014  . Encounter for therapeutic drug monitoring 12/23/2013  . Knee stiffness 02/06/2013  . Knee pain 02/06/2013  . Difficulty in walking(719.7) 02/06/2013  . Acute blood loss anemia 01/17/2013  . Postop Hyponatremia 01/15/2013  . OA (osteoarthritis) of knee 01/14/2013  . Thrombocytopenia ([place]) 05/26/2011  . Sleep apnea   . Degenerative joint disease   . Chronic anticoagulation 02/02/2011  . Hyperlipidemia 07/29/2010  . Hypertension 12/07/2007    LABS    Component Value Date/Time   NA 143 03/05/2016 0600   NA 145 03/04/2016 0800   NA 140 02/25/2016 1001   K 3.7 03/05/2016 0600   K 4.4 03/04/2016 0800   K 4.5 02/25/2016 1001   CL 107 03/05/2016 0600   CL  110 03/04/2016 0800   CL 106 02/25/2016 1001   CO2 23 03/05/2016 0600   CO2 23 03/04/2016 0800   CO2 26 02/25/2016 1001   GLUCOSE 103* 03/05/2016 0600   GLUCOSE 113* 03/04/2016 0800   GLUCOSE 82 02/25/2016 1001   BUN 17 03/05/2016 0600   BUN 25* 03/04/2016 0800   BUN 17 02/25/2016 1001   CREATININE 0.98 03/05/2016 0600   CREATININE 1.00 03/04/2016 0800   CREATININE 1.07 02/25/2016 1001   CREATININE 0.98 02/15/2016 1440   CREATININE 0.83 03/12/2014 0432   CREATININE 1.18 05/25/2011 1556   CALCIUM 8.8* 03/05/2016 0600   CALCIUM 9.0 03/04/2016 0800   CALCIUM 9.1 02/25/2016 1001   GFRNONAA >60 03/05/2016 0600   GFRNONAA >60 03/04/2016 0800   GFRNONAA 86* 03/12/2014 0432   GFRAA >60 03/05/2016 0600   GFRAA >60 03/04/2016 0800   GFRAA >90  03/12/2014 0432   CMP     Component Value Date/Time   NA 143 03/05/2016 0600   K 3.7 03/05/2016 0600   CL 107 03/05/2016 0600   CO2 23 03/05/2016 0600   GLUCOSE 103* 03/05/2016 0600   BUN 17 03/05/2016 0600   CREATININE 0.98 03/05/2016 0600   CREATININE 1.07 02/25/2016 1001   CALCIUM 8.8* 03/05/2016 0600   PROT 7.1 02/28/2014 1330   ALBUMIN 3.8 02/28/2014 1330   AST 26 02/28/2014 1330   ALT 18 02/28/2014 1330   ALKPHOS 53 02/28/2014 1330   BILITOT 0.8 02/28/2014 1330   GFRNONAA >60 03/05/2016 0600   GFRAA >60 03/05/2016 0600       Component Value Date/Time   WBC 4.6 03/06/2016 0611   WBC 4.8 03/05/2016 0600   WBC 4.6 03/04/2016 0800   HGB 13.9 03/06/2016 0611   HGB 14.5 03/05/2016 0600   HGB 14.8 03/04/2016 0800   HCT 43.7 03/06/2016 0611   HCT 45.8 03/05/2016 0600   HCT 45.5 03/04/2016 0800   MCV 93.8 03/06/2016 0611   MCV 94.2 03/05/2016 0600   MCV 95.2 03/04/2016 0800    Lipid Panel     Component Value Date/Time   CHOL 187 11/28/2012 0830   TRIG 286* 11/28/2012 0830   HDL 30* 11/28/2012 0830   CHOLHDL 6.2 11/28/2012 0830   VLDL 57* 11/28/2012 0830   LDLCALC 100* 11/28/2012 0830    ABG No results found for: PHART, PCO2ART, PO2ART, HCO3, TCO2, ACIDBASEDEF, O2SAT   Lab Results  Component Value Date   TSH 2.024 07/16/2010   BNP (last 3 results) No results for input(s): BNP in the last 8760 hours.  ProBNP (last 3 results) No results for input(s): PROBNP in the last 8760 hours.  Cardiac Panel (last 3 results) No results for input(s): CKTOTAL, CKMB, TROPONINI, RELINDX in the last 72 hours.  Iron/TIBC/Ferritin/ %Sat No results found for: IRON, TIBC, FERRITIN, IRONPCTSAT   EKG Orders placed or performed during the hospital encounter of 03/04/16  . EKG 12-Lead  . EKG 12-Lead  . EKG 12-Lead  . EKG 12-Lead     Prior Assessment and Plan Problem List as of 03/11/2016      Cardiovascular and Mediastinum   Hypertension   Last Assessment & Plan  11/02/2012 Office Visit Written 11/02/2012  3:00 PM by Yehuda Savannah, MD    No elevation in the systolic or diastolic BP has been recorded for at least the past 4 years. Current medication will be continued.      PAF (paroxysmal atrial fibrillation) ([facility])   Non-ischemic cardiomyopathy ([place])  AF (atrial fibrillation) ([facility])   CAD- minor CAD at cath 03/04/16   Acute CHF - secondary to AF with RVR     Musculoskeletal and Integument   Degenerative joint disease   OA (osteoarthritis) of knee     Other   Hyperlipidemia   Last Assessment & Plan 11/02/2012 Office Visit Edited 11/02/2012  2:59 PM by Yehuda Savannah, MD    Patient is being treated with fish oil and low-dose niacin for elevated triglycerides and low HDL, but needs statin therapy as well. Atorvastatin will be added to medical regime and lipid profile repeated.      Chronic anticoagulation   Last Assessment & Plan 11/02/2012 Office Visit Written 11/02/2012  3:03 PM by Yehuda Savannah, MD    No evidence for occult GI blood loss. Stool samples for Hemoccult testing will be requested. INR as have been stable and therapeutic.      Sleep apnea   Thrombocytopenia ([facility])   Postop Hyponatremia   Acute blood loss anemia   Knee stiffness   Knee pain   Difficulty in walking(719.7)   Encounter for therapeutic drug monitoring   Edema   Abnormal nuclear stress test       Imaging: Nm Myocar Multi W/spect W/wall Motion / Ef  02/24/2016   No diagnostic ST segment changes over baseline abnormalities. Atrial fibrillation present throughout.  Medium sized, mild to moderate intensity, inferior defect extending from apex to base. This region is predominantly fixed with partial reversibility at the inferolateral apex. Suggestive of potential scar with minor peri-infarct ischemia.  This is a high risk study based on reduction in LVEF.  Nuclear stress EF: 21%.

## 2016-03-11 NOTE — Patient Instructions (Signed)
Medication Instructions:  Decrease amiodarone to 200 mg once daily   Labwork: Your physician recommends that you return for lab work in: just before visit in 50 month with Dr. Harl Bowie bmet  Testing/Procedures: none  Follow-Up: Your physician recommends that you schedule a follow-up appointment in: 1 month with Dr. Harl Bowie    Any Other Special Instructions Will Be Listed Below (If Applicable).     If you need a refill on your cardiac medications before your next appointment, please call your pharmacy.

## 2016-03-15 DIAGNOSIS — H2511 Age-related nuclear cataract, right eye: Secondary | ICD-10-CM | POA: Diagnosis not present

## 2016-03-25 ENCOUNTER — Ambulatory Visit (INDEPENDENT_AMBULATORY_CARE_PROVIDER_SITE_OTHER): Payer: Medicare Other | Admitting: *Deleted

## 2016-03-25 DIAGNOSIS — Z7901 Long term (current) use of anticoagulants: Secondary | ICD-10-CM | POA: Diagnosis not present

## 2016-03-25 DIAGNOSIS — I4891 Unspecified atrial fibrillation: Secondary | ICD-10-CM

## 2016-03-25 DIAGNOSIS — Z5181 Encounter for therapeutic drug level monitoring: Secondary | ICD-10-CM | POA: Diagnosis not present

## 2016-03-25 LAB — POCT INR: INR: 4

## 2016-03-28 DIAGNOSIS — L820 Inflamed seborrheic keratosis: Secondary | ICD-10-CM | POA: Diagnosis not present

## 2016-03-28 DIAGNOSIS — D1801 Hemangioma of skin and subcutaneous tissue: Secondary | ICD-10-CM | POA: Diagnosis not present

## 2016-03-28 DIAGNOSIS — L814 Other melanin hyperpigmentation: Secondary | ICD-10-CM | POA: Diagnosis not present

## 2016-03-28 DIAGNOSIS — D235 Other benign neoplasm of skin of trunk: Secondary | ICD-10-CM | POA: Diagnosis not present

## 2016-03-28 DIAGNOSIS — D485 Neoplasm of uncertain behavior of skin: Secondary | ICD-10-CM | POA: Diagnosis not present

## 2016-03-28 DIAGNOSIS — L821 Other seborrheic keratosis: Secondary | ICD-10-CM | POA: Diagnosis not present

## 2016-03-28 DIAGNOSIS — Z85820 Personal history of malignant melanoma of skin: Secondary | ICD-10-CM | POA: Diagnosis not present

## 2016-04-01 DIAGNOSIS — Z79899 Other long term (current) drug therapy: Secondary | ICD-10-CM | POA: Diagnosis not present

## 2016-04-02 LAB — BASIC METABOLIC PANEL
BUN: 21 mg/dL (ref 7–25)
CO2: 27 mmol/L (ref 20–31)
Calcium: 8.9 mg/dL (ref 8.6–10.3)
Chloride: 105 mmol/L (ref 98–110)
Creat: 1.35 mg/dL — ABNORMAL HIGH (ref 0.70–1.18)
Glucose, Bld: 101 mg/dL — ABNORMAL HIGH (ref 65–99)
POTASSIUM: 4.6 mmol/L (ref 3.5–5.3)
Sodium: 142 mmol/L (ref 135–146)

## 2016-04-11 ENCOUNTER — Ambulatory Visit (INDEPENDENT_AMBULATORY_CARE_PROVIDER_SITE_OTHER): Payer: Medicare Other | Admitting: *Deleted

## 2016-04-11 DIAGNOSIS — Z7901 Long term (current) use of anticoagulants: Secondary | ICD-10-CM | POA: Diagnosis not present

## 2016-04-11 DIAGNOSIS — I4891 Unspecified atrial fibrillation: Secondary | ICD-10-CM | POA: Diagnosis not present

## 2016-04-11 DIAGNOSIS — Z5181 Encounter for therapeutic drug level monitoring: Secondary | ICD-10-CM | POA: Diagnosis not present

## 2016-04-11 LAB — POCT INR: INR: 4.9

## 2016-04-13 ENCOUNTER — Ambulatory Visit (INDEPENDENT_AMBULATORY_CARE_PROVIDER_SITE_OTHER): Payer: Medicare Other | Admitting: *Deleted

## 2016-04-13 ENCOUNTER — Telehealth: Payer: Self-pay | Admitting: Cardiology

## 2016-04-13 ENCOUNTER — Ambulatory Visit (INDEPENDENT_AMBULATORY_CARE_PROVIDER_SITE_OTHER): Payer: Medicare Other | Admitting: Cardiology

## 2016-04-13 VITALS — BP 138/90 | HR 122 | Ht 70.0 in | Wt 236.0 lb

## 2016-04-13 DIAGNOSIS — Z5181 Encounter for therapeutic drug level monitoring: Secondary | ICD-10-CM | POA: Diagnosis not present

## 2016-04-13 DIAGNOSIS — Z7901 Long term (current) use of anticoagulants: Secondary | ICD-10-CM | POA: Diagnosis not present

## 2016-04-13 DIAGNOSIS — I481 Persistent atrial fibrillation: Secondary | ICD-10-CM | POA: Diagnosis not present

## 2016-04-13 DIAGNOSIS — I5022 Chronic systolic (congestive) heart failure: Secondary | ICD-10-CM | POA: Diagnosis not present

## 2016-04-13 DIAGNOSIS — I10 Essential (primary) hypertension: Secondary | ICD-10-CM

## 2016-04-13 DIAGNOSIS — I4891 Unspecified atrial fibrillation: Secondary | ICD-10-CM

## 2016-04-13 DIAGNOSIS — E785 Hyperlipidemia, unspecified: Secondary | ICD-10-CM | POA: Diagnosis not present

## 2016-04-13 DIAGNOSIS — I4819 Other persistent atrial fibrillation: Secondary | ICD-10-CM

## 2016-04-13 LAB — POCT INR: INR: 2.4

## 2016-04-13 MED ORDER — AMIODARONE HCL 200 MG PO TABS: 200.0000 mg | ORAL_TABLET | Freq: Two times a day (BID) | ORAL | Status: DC

## 2016-04-13 MED ORDER — POTASSIUM CHLORIDE CRYS ER 20 MEQ PO TBCR: 20.0000 meq | EXTENDED_RELEASE_TABLET | ORAL | Status: DC

## 2016-04-13 MED ORDER — FUROSEMIDE 40 MG PO TABS: 40.0000 mg | ORAL_TABLET | ORAL | Status: DC

## 2016-04-13 MED ORDER — CARVEDILOL 25 MG PO TABS: 25.0000 mg | ORAL_TABLET | Freq: Two times a day (BID) | ORAL | Status: DC

## 2016-04-13 NOTE — Patient Instructions (Signed)
Your physician recommends that you schedule a follow-up appointment in:  1 month Dr Harl Bowie   Get lab work on Thursday 04/21/16   Your physician has recommended that you have a Cardioversion (DCCV) on Friday June 30 th . Electrical Cardioversion uses a jolt of electricity to your heart either through paddles or wired patches attached to your chest. This is a controlled, usually prescheduled, procedure. Defibrillation is done under light anesthesia in the hospital, and you usually go home the day of the procedure. This is done to get your heart back into a normal rhythm. You are not awake for the procedure. Please see the instruction sheet given to you today.   Coralyn Mark will call you tomorrow to give you time to arrive   STOP Lopressor    START Coreg 25 mg twice a day  INCREASE Amiodarone to 200 mg twice a day  Take lasix and potassium every OTHER day now     Thank you for choosing [facility] !

## 2016-04-13 NOTE — Progress Notes (Signed)
Clinical Summary Mr. Wark is a 75 y.o.male seen today for follow up of the following medical problems.    1. Afib - recently started on amiodarone as outpatient, now down to 200mg  daily - denies any recent palpitations.  - followed in our coumadin clinic, denies any bleeding troubles.    2. Hyperlipidemia - was previously on prava, developed muscle aches. Stopped taking approx December, symptoms resolved. - reports he has tried multiple other statins with Dr Willey Blade, side effects with all.  - Feb 2016: TC 211 TG 389 HDL 31 LDL 102.  3. HTN - does not check at home  - compliant with meds  4. Chronic systolic HF - echo [id number] LVEF 30-35%, severe basal infeiror hypokinesis. Cannot eval diasotlic function.  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 Hypertension   . Hyperlipidemia   . Degenerative joint disease     Right knee  . Chronic anticoagulation   . Sleep apnea     Rx-CPAP  . Atrial fibrillation ([place]) 06/2010  . Elevated cholesterol   . Shortness of breath     on exertion  . Nocturia   . BPH (benign prostatic hyperplasia)      No Known Allergies   Current Outpatient Prescriptions  Medication Sig Dispense Refill  . amiodarone (PACERONE) 200 MG tablet Take 1 tablet (200 mg total) by mouth daily. 90 tablet 3  . furosemide (LASIX) 40 MG tablet Take 1 tablet (40 mg total) by mouth daily. 30 tablet 11  . lisinopril (PRINIVIL,ZESTRIL) 10 MG tablet Take 1 tablet (10 mg total) by mouth 2 (two) times daily. 180 tablet 3  . Metoprolol Tartrate 75 MG TABS Take 75 mg by mouth 2 (two) times daily. 60 tablet 11  . NONFORMULARY OR COMPOUNDED ITEM 1 application at bedtime. CPAP    . Omega-3 Fatty Acids (FISH OIL) 1000 MG CAPS Take 1,000 mg by mouth 2 (two) times daily.     . potassium chloride SA (K-DUR,KLOR-CON)  20 MEQ tablet Take 1 tablet (20 mEq total) by mouth daily. (Patient not taking: Reported on 03/11/2016) 30 tablet 11  . warfarin (COUMADIN) 10 MG tablet TAKE ONE TABLET (10 mg) BY MOUTH ONCE DAILY EXCEPT ONE-HALF TAB  (5 mg) ON SUNDAYS, TUESDAYS, AND THURSDAYS 45 tablet 4   No current facility-administered medications for this visit.     Past Surgical History  Procedure Laterality Date  . Ventral hernia repair  03/2008, 02/2009  . Colonoscopy  03/10/2005  . 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 Total knee arthroplasty Left 03/10/2014    Procedure: LEFT TOTAL KNEE ARTHROPLASTY;  Surgeon: Gearlean Alf, MD;  Location: [facility];  Service: Orthopedics;  Laterality: Left;  . Cardiac catheterization N/A 03/04/2016    Procedure: Left Heart Cath and Coronary Angiography;  Surgeon: Troy Sine, MD;  Location: [facility];  Service: Cardiovascular;  Laterality: N/A;     No Known Allergies    Family History  Problem Relation Age of Onset  . Heart attack Father      Social History Mr. Addis reports that he has been smoking Cigars.  He has never used smokeless tobacco. Mr. Donez reports that he does not drink  alcohol.   Review of Systems CONSTITUTIONAL: No weight loss, fever, chills, weakness or fatigue.  HEENT: Eyes: No visual loss, blurred vision, double vision or yellow sclerae.No hearing loss, sneezing, congestion, runny nose or sore throat.  SKIN: No rash or itching.  CARDIOVASCULAR: per HPI RESPIRATORY: No shortness of breath, cough or sputum.  GASTROINTESTINAL: No anorexia, nausea, vomiting or diarrhea. No abdominal pain or blood.  GENITOURINARY: No burning on urination, no polyuria NEUROLOGICAL: No headache, dizziness, syncope, paralysis, ataxia, numbness or tingling in the extremities. No change in bowel or bladder control.    MUSCULOSKELETAL: No muscle, back pain, joint pain or stiffness.  LYMPHATICS: No enlarged nodes. No history of splenectomy.  PSYCHIATRIC: No history of depression or anxiety.  ENDOCRINOLOGIC: No reports of sweating, cold or heat intolerance. No polyuria or polydipsia.  Marland Kitchen   Physical Examination Filed Vitals:   04/13/16 1527  BP: 138/90  Pulse: 122   Filed Vitals:   04/13/16 1527  Height: 5\' 10"  (1.778 m)  Weight: 236 lb (107.049 kg)    Gen: resting comfortably, no acute distress HEENT: no scleral icterus, pupils equal round and reactive, no palptable cervical adenopathy,  CV: irreg, no m/r/g, no jvd Resp: Clear to auscultation bilaterally GI: abdomen is soft, non-tender, non-distended, normal bowel sounds, no hepatosplenomegaly MSK: extremities are warm, no edema.  Skin: warm, no rash Neuro:  no focal deficits Psych: appropriate affect   Diagnostic Studies Echo 09/2010: LVEF 55%, mild LVH, mild MR, mod LAE.    02/2016 Echo Study Conclusions  - Left ventricle: The cavity size was moderately dilated. Wall  thickness was at the upper limits of normal. Systolic function  was moderately to severely reduced. The estimated ejection  fraction was in the range of 30% to 35%. Diffuse hypokinesis.  There is severe hypokinesis of the basalinferior myocardium. The  study is not technically sufficient to allow evaluation of LV  diastolic function. - Mitral valve: Mildly thickened leaflets . There was mild  regurgitation. - Left atrium: The atrium was severely dilated. - Right ventricle: The cavity size was mildly dilated. Systolic  function was mildly reduced. - Right atrium: The atrium was severely dilated. - Atrial septum: No defect or patent foramen ovale was identified. - Tricuspid valve: There was trivial regurgitation. Peak RV-RA  gradient (S): 26 mm Hg. - Pericardium, extracardiac: There was no pericardial effusion.  Impressions:  - Upper normal LV wall  thickness with moderate chamber dilatation  and LVEF approximately 30-35% in the setting of atrial  fibrillation. LVEF decreased compared to previous study in 2011.  Diffuse hypokinesis, most prominent in the inferior wall.  Indeterminate diastolic function. Severe biatrial enlargement.  Mildly thickened mitral leaflets with mild mitral regurgitation.  Mildly dilated RV with reduced contraction. Trivial tricuspid  regurgitation, RV-RA gradient 26 mmHg. Unable to assess CVP.  02/2016 Cath  Prox LAD lesion, 10% stenosed.  Mid LAD lesion, 20% stenosed.  Ost 3rd Diag lesion, 40% stenosed.  Prox RCA lesion, 20% stenosed.   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 Left ventriculography was not done.   RECOMMENDATION: Medical therapy for his cardiomyopathy with improved rate control of his atrial fibrillation.   Assessment and Plan   1. Afib - ekg in clinic today shows afib with RVR. He is asymptomatic - we will increase his amiodarone to 200mg  bid and plan for  DCCV next week if his INR is therapeutic today, which would mark 3 weeks of therapeutic INRs.   2. Hyperlipidemia - intolerant to statins, continue exercise and dietary modifcation  3. HTN - at goal, he will continue current meds   4. Chronic systolic HF - probable tachycardia medicated CM, his afib remains poorly controlled. Plan for DCCV next week - change lopressor to coreg in setting of systolic dysfunction - once rate controlled will need repeat echo in next few months.      Arnoldo Lenis, M.D.

## 2016-04-13 NOTE — Telephone Encounter (Signed)
Cardioversion [id number]  CHECKING PERCERT

## 2016-04-14 NOTE — Telephone Encounter (Signed)
No precert required.  Pt has Medicare and AARP supplement.

## 2016-04-15 ENCOUNTER — Encounter: Payer: Self-pay | Admitting: Cardiology

## 2016-04-18 ENCOUNTER — Encounter (HOSPITAL_COMMUNITY): Admission: RE | Admit: 2016-04-18 | Payer: Medicare Other | Source: Ambulatory Visit

## 2016-04-22 ENCOUNTER — Encounter (HOSPITAL_COMMUNITY): Payer: Self-pay

## 2016-04-22 ENCOUNTER — Ambulatory Visit (HOSPITAL_COMMUNITY): Admit: 2016-04-22 | Payer: Medicare Other | Admitting: Cardiology

## 2016-04-22 SURGERY — CARDIOVERSION
Anesthesia: Monitor Anesthesia Care

## 2016-04-25 ENCOUNTER — Other Ambulatory Visit: Payer: Self-pay | Admitting: Cardiology

## 2016-04-25 ENCOUNTER — Encounter (HOSPITAL_COMMUNITY): Payer: Self-pay

## 2016-04-25 ENCOUNTER — Ambulatory Visit (INDEPENDENT_AMBULATORY_CARE_PROVIDER_SITE_OTHER): Payer: Medicare Other | Admitting: *Deleted

## 2016-04-25 ENCOUNTER — Encounter (HOSPITAL_COMMUNITY)
Admission: RE | Admit: 2016-04-25 | Discharge: 2016-04-25 | Disposition: A | Discharge status: Home / Self Care | Payer: Medicare Other | Source: Ambulatory Visit | Attending: Cardiology | Admitting: Cardiology

## 2016-04-25 DIAGNOSIS — Z01812 Encounter for preprocedural laboratory examination: Secondary | ICD-10-CM | POA: Insufficient documentation

## 2016-04-25 DIAGNOSIS — I4891 Unspecified atrial fibrillation: Secondary | ICD-10-CM

## 2016-04-25 DIAGNOSIS — Z01810 Encounter for preprocedural cardiovascular examination: Secondary | ICD-10-CM | POA: Diagnosis present

## 2016-04-25 DIAGNOSIS — E785 Hyperlipidemia, unspecified: Secondary | ICD-10-CM | POA: Insufficient documentation

## 2016-04-25 DIAGNOSIS — Z5181 Encounter for therapeutic drug level monitoring: Secondary | ICD-10-CM | POA: Diagnosis not present

## 2016-04-25 DIAGNOSIS — Z7901 Long term (current) use of anticoagulants: Secondary | ICD-10-CM | POA: Insufficient documentation

## 2016-04-25 DIAGNOSIS — I10 Essential (primary) hypertension: Secondary | ICD-10-CM | POA: Insufficient documentation

## 2016-04-25 LAB — BASIC METABOLIC PANEL
ANION GAP: 8 (ref 5–15)
BUN: 25 mg/dL — ABNORMAL HIGH (ref 6–20)
CALCIUM: 9.1 mg/dL (ref 8.9–10.3)
CHLORIDE: 107 mmol/L (ref 101–111)
CO2: 25 mmol/L (ref 22–32)
CREATININE: 1.21 mg/dL (ref 0.61–1.24)
GFR calc non Af Amer: 58 mL/min — ABNORMAL LOW (ref >60)
Glucose, Bld: 82 mg/dL (ref 65–99)
Potassium: 4.1 mmol/L (ref 3.5–5.1)
SODIUM: 140 mmol/L (ref 135–145)

## 2016-04-25 LAB — PROTIME-INR
INR: 1.29 (ref 0.00–1.49)
PROTHROMBIN TIME: 16.2 s — AB (ref 11.6–15.2)

## 2016-04-25 LAB — APTT: aPTT: 28 seconds (ref 24–37)

## 2016-04-25 LAB — POCT INR: INR: 1.4

## 2016-04-25 NOTE — Patient Instructions (Addendum)
Joshua [name]  04/25/2016     @PREFPERIOPPHARMACY @   Your procedure is scheduled on 04/29/2016.  Report to [facility] at 8:00 A.M.  Call this number if you have problems the morning of surgery:  [phone]   Remember:  Do not eat food or drink liquids after midnight.  Take these medicines the morning of surgery with A SIP OF WATER Amiodarone, Coumadin, Coreg, Lisinopril   Do not wear jewelry, make-up or nail polish.  Do not wear lotions, powders, or perfumes.  You may wear deoderant.  Do not shave 48 hours prior to surgery.  Men may shave face and neck.  Do not bring valuables to the hospital.  [facility] is not responsible for any belongings or valuables.  Contacts, dentures or bridgework may not be worn into surgery.  Leave your suitcase in the car.  After surgery it may be brought to your room.  For patients admitted to the hospital, discharge time will be determined by your treatment team.  Patients discharged the day of surgery will not be allowed to drive home.    Please read over the following fact sheets that you were given. Anesthesia Post-op Instructions     PATIENT INSTRUCTIONS POST-ANESTHESIA  IMMEDIATELY FOLLOWING SURGERY:  Do not drive or operate machinery for the first twenty four hours after surgery.  Do not make any important decisions for twenty four hours after surgery or while taking narcotic pain medications or sedatives.  If you develop intractable nausea and vomiting or a severe headache please notify your doctor immediately.  FOLLOW-UP:  Please make an appointment with your surgeon as instructed. You do not need to follow up with anesthesia unless specifically instructed to do so.  WOUND CARE INSTRUCTIONS (if applicable):  Keep a dry clean dressing on the anesthesia/puncture wound site if there is drainage.  Once the wound has quit draining you may leave it open to air.  Generally you should leave the bandage intact for twenty four hours unless  there is drainage.  If the epidural site drains for more than 36-48 hours please call the anesthesia department.  QUESTIONS?:  Please feel free to call your physician or the hospital operator if you have any questions, and they will be happy to assist you.      Electrical Cardioversion Electrical cardioversion is the delivery of a jolt of electricity to change the rhythm of the heart. Sticky patches or metal paddles are placed on the chest to deliver the electricity from a device. This is done to restore a normal rhythm. A rhythm that is too fast or not regular keeps the heart from pumping well. Electrical cardioversion is done in an emergency if:   There is low or no blood pressure as a result of the heart rhythm.   Normal rhythm must be restored as fast as possible to protect the brain and heart from further damage.   It may save a life. Cardioversion may be done for heart rhythms that are not immediately life threatening, such as atrial fibrillation or flutter, in which:   The heart is beating too fast or is not regular.   Medicine to change the rhythm has not worked.   It is safe to wait in order to allow time for preparation.  Symptoms of the abnormal rhythm are bothersome.  The risk of stroke and other serious problems can be reduced. LET [facility] CARE PROVIDER KNOW ABOUT:   Any allergies you have.  All medicines you  are taking, including vitamins, herbs, eye drops, creams, and over-the-counter medicines.  Previous problems you or members of your family have had with the use of anesthetics.   Any blood disorders you have.   Previous surgeries you have had.   Medical conditions you have. RISKS AND COMPLICATIONS  Generally, this is a safe procedure. However, problems can occur and include:   Breathing problems related to the anesthetic used.  A blood clot that breaks free and travels to other parts of your body. This could cause a stroke or other problems. The  risk of this is lowered by use of blood-thinning medicine (anticoagulant) prior to the procedure.  Cardiac arrest (rare). BEFORE THE PROCEDURE   You may have tests to detect blood clots in your heart and to evaluate heart function.  You may start taking anticoagulants so your blood does not clot as easily.   Medicines may be given to help stabilize your heart rate and rhythm. PROCEDURE  You will be given medicine through an IV tube to reduce discomfort and make you sleepy (sedative).   An electrical shock will be delivered. AFTER THE PROCEDURE Your heart rhythm will be watched to make sure it does not change.    This information is not intended to replace advice given to you by your health care provider. Make sure you discuss any questions you have with your health care provider.   Document Released: 09/30/2002 Document Revised: 10/31/2014 Document Reviewed: 04/24/2013 Elsevier Interactive Patient Education [employer].

## 2016-04-29 ENCOUNTER — Encounter (HOSPITAL_COMMUNITY): Admission: RE | Payer: Self-pay | Source: Ambulatory Visit

## 2016-04-29 ENCOUNTER — Ambulatory Visit (HOSPITAL_COMMUNITY): Admission: RE | Admit: 2016-04-29 | Payer: Medicare Other | Source: Ambulatory Visit | Admitting: Cardiology

## 2016-04-29 SURGERY — CARDIOVERSION
Anesthesia: Monitor Anesthesia Care

## 2016-05-09 ENCOUNTER — Ambulatory Visit (INDEPENDENT_AMBULATORY_CARE_PROVIDER_SITE_OTHER): Payer: Medicare Other | Admitting: *Deleted

## 2016-05-09 DIAGNOSIS — Z5181 Encounter for therapeutic drug level monitoring: Secondary | ICD-10-CM | POA: Diagnosis not present

## 2016-05-09 DIAGNOSIS — I4891 Unspecified atrial fibrillation: Secondary | ICD-10-CM | POA: Diagnosis not present

## 2016-05-09 DIAGNOSIS — Z7901 Long term (current) use of anticoagulants: Secondary | ICD-10-CM

## 2016-05-09 LAB — POCT INR: INR: 3

## 2016-05-16 ENCOUNTER — Ambulatory Visit (INDEPENDENT_AMBULATORY_CARE_PROVIDER_SITE_OTHER): Payer: Medicare Other | Admitting: Cardiology

## 2016-05-16 ENCOUNTER — Ambulatory Visit (INDEPENDENT_AMBULATORY_CARE_PROVIDER_SITE_OTHER): Payer: Medicare Other | Admitting: *Deleted

## 2016-05-16 ENCOUNTER — Encounter: Payer: Self-pay | Admitting: Cardiology

## 2016-05-16 VITALS — BP 130/90 | HR 101 | Ht 70.0 in | Wt 238.0 lb

## 2016-05-16 DIAGNOSIS — I4891 Unspecified atrial fibrillation: Secondary | ICD-10-CM | POA: Diagnosis not present

## 2016-05-16 DIAGNOSIS — Z5181 Encounter for therapeutic drug level monitoring: Secondary | ICD-10-CM

## 2016-05-16 DIAGNOSIS — Z7901 Long term (current) use of anticoagulants: Secondary | ICD-10-CM

## 2016-05-16 DIAGNOSIS — I10 Essential (primary) hypertension: Secondary | ICD-10-CM

## 2016-05-16 LAB — POCT INR: INR: 2.8

## 2016-05-16 MED ORDER — AMIODARONE HCL 200 MG PO TABS: 200.0000 mg | ORAL_TABLET | Freq: Every day | ORAL | 3 refills | Status: DC

## 2016-05-16 MED ORDER — LISINOPRIL 20 MG PO TABS: 20.0000 mg | ORAL_TABLET | Freq: Two times a day (BID) | ORAL | 3 refills | Status: DC

## 2016-05-16 NOTE — Progress Notes (Signed)
Clinical Summary Joshua [name] is a 75 y.o.male seen today for follow up of the following medical problems.   1. Afib - recently started on amiodarone as outpatient - denies any recent palpitations.  - we had planned a DCCV however patient missed a few days of coumadin and had subtherapeutic INR and procedure was cancelled   2. Hyperlipidemia - was previously on prava, developed muscle aches. Stopped taking approx December, symptoms resolved. - reports he has tried multiple other statins with Dr Willey Blade, side effects with all.  - Feb 2016: TC 211 TG 389 HDL 31 LDL 102.  3. HTN - compliant with meds  4. Chronic systolic HF - echo 123456 LVEF 30-35%, severe basal infeiror hypokinesis. Cannot eval diasotlic function.  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 Atrial fibrillation ([place]) 06/2010  . BPH (benign prostatic hyperplasia)   . Chronic anticoagulation   . Degenerative joint disease    Right knee  . Elevated cholesterol   . Hyperlipidemia   . Hypertension   . Nocturia   . Shortness of breath    on exertion  . Sleep apnea    Rx-CPAP     No Known Allergies   Current Outpatient Prescriptions  Medication Sig Dispense Refill  . amiodarone (PACERONE) 200 MG tablet Take 1 tablet (200 mg total) by mouth 2 (two) times daily. 120 tablet 3  . carvedilol (COREG) 25 MG tablet Take 1 tablet (25 mg total) by mouth 2 (two) times daily. 180 tablet 3  . furosemide (LASIX) 40 MG tablet Take 1 tablet (40 mg total) by mouth every other day. 45 tablet 3  . lisinopril (PRINIVIL,ZESTRIL) 10 MG tablet Take 1 tablet (10 mg total) by mouth 2 (two) times daily. 180 tablet 3  . NONFORMULARY OR COMPOUNDED ITEM 1 application at bedtime. CPAP    . Omega-3 Fatty Acids (FISH OIL) 1000 MG CAPS Take 1,000 mg by mouth 2 (two) times daily.       . potassium chloride SA (KLOR-CON M20) 20 MEQ tablet Take 1 tablet (20 mEq total) by mouth every other day. 45 tablet 3  . warfarin (COUMADIN) 10 MG tablet TAKE ONE TABLET (10 mg) BY MOUTH ONCE DAILY EXCEPT ONE-HALF TAB  (5 mg) ON SUNDAYS, TUESDAYS, AND THURSDAYS 45 tablet 4   No current facility-administered medications for this visit.      Past Surgical History:  Procedure Laterality Date  . CARDIAC CATHETERIZATION N/A 03/04/2016   Procedure: Left Heart Cath and Coronary Angiography;  Surgeon: Troy Sine, MD;  Location: [facility];  Service: Cardiovascular;  Laterality: N/A;  . COLONOSCOPY  03/10/2005  . 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 TOTAL KNEE ARTHROPLASTY Left 03/10/2014   Procedure: LEFT TOTAL KNEE ARTHROPLASTY;  Surgeon: Gearlean Alf, MD;  Location: [facility];  Service: Orthopedics;  Laterality: Left;  Marland Kitchen VENTRAL HERNIA REPAIR  03/2008, 02/2009     No Known Allergies    Family History  Problem Relation Age of Onset  . Heart attack Father      Social History Mr. Rapuano reports that he has been smoking Cigars.  He has never used smokeless tobacco. Mr. Zurlo reports that he does not drink alcohol.  Review of Systems CONSTITUTIONAL: No weight loss, fever, chills, weakness or fatigue.  HEENT: Eyes: No visual loss, blurred vision, double vision or yellow sclerae.No hearing loss, sneezing, congestion, runny nose or sore throat.  SKIN: No rash or itching.  CARDIOVASCULAR: per HPI RESPIRATORY: No shortness of breath, cough or sputum.  GASTROINTESTINAL: No anorexia, nausea, vomiting or diarrhea. No abdominal pain or blood.  GENITOURINARY: No burning on urination, no polyuria NEUROLOGICAL: No headache, dizziness, syncope, paralysis, ataxia, numbness or tingling in the extremities. No change in bowel or bladder control.   MUSCULOSKELETAL: No muscle, back pain, joint pain or stiffness.  LYMPHATICS: No enlarged nodes. No history of splenectomy.  PSYCHIATRIC: No history of depression or anxiety.  ENDOCRINOLOGIC: No reports of sweating, cold or heat intolerance. No polyuria or polydipsia.  Marland Kitchen   Physical Examination Vitals:   05/16/16 1609  BP: 130/90  Pulse: (!) 101   Vitals:   05/16/16 1609  Weight: 238 lb (108 kg)  Height: 5\' 10"  (1.778 m)    Gen: resting comfortably, no acute distress HEENT: no scleral icterus, pupils equal round and reactive, no palptable cervical adenopathy,  CV: irreg,rate 100, no m/r/g, no jvd Resp: Clear to auscultation bilaterally GI: abdomen is soft, non-tender, non-distended, normal bowel sounds, no hepatosplenomegaly MSK: extremities are warm, no edema.  Skin: warm, no rash Neuro:  no focal deficits Psych: appropriate affect   Diagnostic Studies Echo 09/2010: LVEF 55%, mild LVH, mild MR, mod LAE.    02/2016 Echo Study Conclusions  - Left ventricle: The cavity size was moderately dilated. Wall  thickness was at the upper limits of normal. Systolic function  was moderately to severely reduced. The estimated ejection  fraction was in the range of 30% to 35%. Diffuse hypokinesis.  There is severe hypokinesis of the basalinferior myocardium. The  study is not technically sufficient to allow evaluation of LV  diastolic function. - Mitral valve: Mildly thickened leaflets . There was mild  regurgitation. - Left atrium: The atrium was severely dilated. - Right ventricle: The cavity size was mildly dilated. Systolic  function was mildly reduced. - Right atrium: The atrium was severely dilated. - Atrial septum: No defect or patent foramen ovale was identified. - Tricuspid valve: There was trivial regurgitation. Peak RV-RA  gradient (S): 26 mm Hg. - Pericardium, extracardiac: There was no pericardial effusion.  Impressions:  - Upper normal LV wall  thickness with moderate chamber dilatation  and LVEF approximately 30-35% in the setting of atrial  fibrillation. LVEF decreased compared to previous study in 2011.  Diffuse hypokinesis, most prominent in the inferior wall.  Indeterminate diastolic function. Severe biatrial enlargement.  Mildly thickened mitral leaflets with mild mitral regurgitation.  Mildly dilated RV with reduced contraction. Trivial tricuspid  regurgitation, RV-RA gradient 26 mmHg. Unable to assess CVP.  02/2016 Cath  Prox LAD lesion, 10% stenosed.  Mid LAD lesion, 20% stenosed.  Ost 3rd Diag lesion, 40% stenosed.  Prox RCA lesion, 20% stenosed.   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 Left ventriculography was not done.   RECOMMENDATION: Medical therapy for his cardiomyopathy with improved rate control of his atrial fibrillation.     Assessment and Plan   1. Afib - ekg in clinic today shows continued afib, he has not converted on amio. We will try again to schedule a DCCV.  - decrease amio  to 200mg  daily - f/u INR this coming up Monday, if therapeutic schedule DCCV.   2. Hyperlipidemia - intolerant to statins,he will continue dietary modifcation  3. HTN - at goal, continue current meds  4. Chronic systolic HF - probable tachycardia medicated CM, his afib remains poorly controlled. We will try again for DCCV.  - increase lisinopril to 20mg  bid. Can consider entresto in the future, especially if LVEF does not improve with better afib control.    F/u 1 month  Arnoldo Lenis, M.D.

## 2016-05-16 NOTE — Patient Instructions (Signed)
Medication Instructions:  INCREASE LISINOPRIL 20 MG TWO TIMES DAILY  DECREASE AMIODARONE TO  200 MG DAILY   Labwork: NONE  Testing/Procedures: NONE  Follow-Up: Your physician recommends that you schedule a follow-up appointment in: 6 WEEKS    Any Other Special Instructions Will Be Listed Below (If Applicable).     If you need a refill on your cardiac medications before your next appointment, please call your pharmacy.

## 2016-05-23 ENCOUNTER — Ambulatory Visit (INDEPENDENT_AMBULATORY_CARE_PROVIDER_SITE_OTHER): Payer: Medicare Other | Admitting: *Deleted

## 2016-05-23 DIAGNOSIS — I4891 Unspecified atrial fibrillation: Secondary | ICD-10-CM

## 2016-05-23 DIAGNOSIS — Z7901 Long term (current) use of anticoagulants: Secondary | ICD-10-CM

## 2016-05-23 DIAGNOSIS — Z5181 Encounter for therapeutic drug level monitoring: Secondary | ICD-10-CM

## 2016-05-23 LAB — POCT INR: INR: 3

## 2016-05-24 ENCOUNTER — Telehealth: Payer: Self-pay

## 2016-05-24 NOTE — Telephone Encounter (Signed)
-----   Message from Orinda Kenner sent at 05/24/2016  2:46 PM EDT ----- Regarding: Cardioversion Dr. Harl Bowie -  Cardioversion to be done Monday 05/30/16 @ 10:30 in PACU.  Krystalyn Kubota/Kisha - Patient is scheduled for 05/30/16 @ 10:30. Please advise patient to register @ 0900 @ [facility]. Also advise patient of pre op visit on Thursday 8/3 @ 2:45 @ [facility].  Thanks, Coralyn Mark ----- Message ----- From: Drema Dallas, CMA Sent: 05/24/2016   2:12 PM To: Bertram Gala Goins   Can you please schedule this pt for a cardioversion per the message below. Thanks! ----- Message ----- From: Malen Gauze, RN Sent: 05/23/2016   4:04 PM To: Arnoldo Lenis, MD, Drema Dallas, CMA  Lattie Haw, Pt is ready to be rescheduled for DCCV.  INR was 3.0 today.  Pt says Mondays and Fridays are best for him.  Please call pt tomorrow 05/24/16. Thanks, Lattie Haw

## 2016-05-24 NOTE — Telephone Encounter (Signed)
Called pt, left message for pt to return call.  

## 2016-05-25 NOTE — Patient Instructions (Signed)
Joshua [name]  05/25/2016     @PREFPERIOPPHARMACY @   Your procedure is scheduled on 05/30/2016 .  Report to [facility] at  900  A.M.  Call this number if you have problems the morning of surgery:  [phone]   Remember:  Do not eat food or drink liquids after midnight.  Take these medicines the morning of surgery with A SIP OF WATER  Coumadin, coreg, pacerone, lisinopril.   Do not wear jewelry, make-up or nail polish.  Do not wear lotions, powders, or perfumes.  You may wear deoderant.  Do not shave 48 hours prior to surgery.  Men may shave face and neck.  Do not bring valuables to the hospital.  [facility] is not responsible for any belongings or valuables.  Contacts, dentures or bridgework may not be worn into surgery.  Leave your suitcase in the car.  After surgery it may be brought to your room.  For patients admitted to the hospital, discharge time will be determined by your treatment team.  Patients discharged the day of surgery will not be allowed to drive home.   Name and phone number of your driver:   family Special instructions:  none  Please read over the following fact sheets that you were given. Coughing and Deep Breathing, Surgical Site Infection Prevention, Anesthesia Post-op Instructions and Care and Recovery After Surgery       Electrical Cardioversion Electrical cardioversion is the delivery of a jolt of electricity to change the rhythm of the heart. Sticky patches or metal paddles are placed on the chest to deliver the electricity from a device. This is done to restore a normal rhythm. A rhythm that is too fast or not regular keeps the heart from pumping well. Electrical cardioversion is done in an emergency if:   There is low or no blood pressure as a result of the heart rhythm.   Normal rhythm must be restored as fast as possible to protect the brain and heart from further damage.   It may save a life. Cardioversion may be done for  heart rhythms that are not immediately life threatening, such as atrial fibrillation or flutter, in which:   The heart is beating too fast or is not regular.   Medicine to change the rhythm has not worked.   It is safe to wait in order to allow time for preparation.  Symptoms of the abnormal rhythm are bothersome.  The risk of stroke and other serious problems can be reduced. LET [facility] CARE PROVIDER KNOW ABOUT:   Any allergies you have.  All medicines you are taking, including vitamins, herbs, eye drops, creams, and over-the-counter medicines.  Previous problems you or members of your family have had with the use of anesthetics.   Any blood disorders you have.   Previous surgeries you have had.   Medical conditions you have. RISKS AND COMPLICATIONS  Generally, this is a safe procedure. However, problems can occur and include:   Breathing problems related to the anesthetic used.  A blood clot that breaks free and travels to other parts of your body. This could cause a stroke or other problems. The risk of this is lowered by use of blood-thinning medicine (anticoagulant) prior to the procedure.  Cardiac arrest (rare). BEFORE THE PROCEDURE   You may have tests to detect blood clots in your heart and to evaluate heart function.  You may start taking anticoagulants so your blood does  not clot as easily.   Medicines may be given to help stabilize your heart rate and rhythm. PROCEDURE  You will be given medicine through an IV tube to reduce discomfort and make you sleepy (sedative).   An electrical shock will be delivered. AFTER THE PROCEDURE Your heart rhythm will be watched to make sure it does not change.    This information is not intended to replace advice given to you by your health care provider. Make sure you discuss any questions you have with your health care provider.   Document Released: 09/30/2002 Document Revised: 10/31/2014 Document Reviewed:  04/24/2013 Elsevier Interactive Patient Education 2016 [employer]. Electrical Cardioversion, Care After Refer to this sheet in the next few weeks. These instructions provide you with information on caring for yourself after your procedure. Your health care provider may also give you more specific instructions. Your treatment has been planned according to current medical practices, but problems sometimes occur. Call your health care provider if you have any problems or questions after your procedure. WHAT TO EXPECT AFTER THE PROCEDURE After your procedure, it is typical to have the following sensations:  Some redness on the skin where the shocks were delivered. If this is tender, a sunburn lotion or hydrocortisone cream may help.  Possible return of an abnormal heart rhythm within hours or days after the procedure. HOME CARE INSTRUCTIONS  Take medicines only as directed by your health care provider. Be sure you understand how and when to take your medicine.  Learn how to feel your pulse and check it often.  Limit your activity for 48 hours after the procedure or as directed by your health care provider.  Avoid or minimize caffeine and other stimulants as directed by your health care provider. SEEK MEDICAL CARE IF:  You feel like your heart is beating too fast or your pulse is not regular.  You have any questions about your medicines.  You have bleeding that will not stop. SEEK IMMEDIATE MEDICAL CARE IF:  You are dizzy or feel faint.  It is hard to breathe or you feel short of breath.  There is a change in discomfort in your chest.  Your speech is slurred or you have trouble moving an arm or leg on one side of your body.  You get a serious muscle cramp that does not go away.  Your fingers or toes turn cold or blue.   This information is not intended to replace advice given to you by your health care provider. Make sure you discuss any questions you have with your health care  provider.   Document Released: 07/31/2013 Document Revised: 10/31/2014 Document Reviewed: 07/31/2013 Elsevier Interactive Patient Education 2016 [place] Monitored anesthesia care is an anesthesia service for a medical procedure. Anesthesia is the loss of the ability to feel pain. It is produced by medicines called anesthetics. It may affect a small area of your body (local anesthesia), a large area of your body (regional anesthesia), or your entire body (general anesthesia). The need for monitored anesthesia care depends your procedure, your condition, and the potential need for regional or general anesthesia. It is often provided during procedures where:   General anesthesia may be needed if there are complications. This is because you need special care when you are under general anesthesia.   You will be under local or regional anesthesia. This is so that you are able to have higher levels of anesthesia if needed.   You will receive  calming medicines (sedatives). This is especially the case if sedatives are given to put you in a semi-conscious state of relaxation (deep sedation). This is because the amount of sedative needed to produce this state can be hard to predict. Too much of a sedative can produce general anesthesia. Monitored anesthesia care is performed by one or more health care providers who have special training in all types of anesthesia. You will need to meet with these health care providers before your procedure. During this meeting, they will ask you about your medical history. They will also give you instructions to follow. (For example, you will need to stop eating and drinking before your procedure. You may also need to stop or change medicines you are taking.) During your procedure, your health care providers will stay with you. They will:   Watch your condition. This includes watching your blood pressure, breathing, and level of pain.    Diagnose and treat problems that occur.   Give medicines if they are needed. These may include calming medicines (sedatives) and anesthetics.   Make sure you are comfortable.  Having monitored anesthesia care does not necessarily mean that you will be under anesthesia. It does mean that your health care providers will be able to manage anesthesia if you need it or if it occurs. It also means that you will be able to have a different type of anesthesia than you are having if you need it. When your procedure is complete, your health care providers will continue to watch your condition. They will make sure any medicines wear off before you are allowed to go home.    This information is not intended to replace advice given to you by your health care provider. Make sure you discuss any questions you have with your health care provider.   Document Released: 07/06/2005 Document Revised: 10/31/2014 Document Reviewed: 11/21/2012 Elsevier Interactive Patient Education [employer].

## 2016-05-26 ENCOUNTER — Encounter (HOSPITAL_COMMUNITY)
Admission: RE | Admit: 2016-05-26 | Discharge: 2016-05-26 | Disposition: A | Discharge status: Home / Self Care | Payer: Medicare Other | Source: Ambulatory Visit | Attending: Cardiology | Admitting: Cardiology

## 2016-05-26 ENCOUNTER — Other Ambulatory Visit: Payer: Self-pay | Admitting: Cardiology

## 2016-05-26 ENCOUNTER — Encounter (HOSPITAL_COMMUNITY): Payer: Self-pay

## 2016-05-26 DIAGNOSIS — Z7901 Long term (current) use of anticoagulants: Secondary | ICD-10-CM | POA: Insufficient documentation

## 2016-05-26 DIAGNOSIS — Z01812 Encounter for preprocedural laboratory examination: Secondary | ICD-10-CM | POA: Insufficient documentation

## 2016-05-26 DIAGNOSIS — I4891 Unspecified atrial fibrillation: Secondary | ICD-10-CM | POA: Insufficient documentation

## 2016-05-26 DIAGNOSIS — I10 Essential (primary) hypertension: Secondary | ICD-10-CM | POA: Diagnosis not present

## 2016-05-26 LAB — BASIC METABOLIC PANEL
Anion gap: 4 — ABNORMAL LOW (ref 5–15)
BUN: 28 mg/dL — AB (ref 6–20)
CALCIUM: 8.9 mg/dL (ref 8.9–10.3)
CO2: 27 mmol/L (ref 22–32)
CREATININE: 1.58 mg/dL — AB (ref 0.61–1.24)
Chloride: 106 mmol/L (ref 101–111)
GFR calc Af Amer: 48 mL/min — ABNORMAL LOW (ref >60)
GFR, EST NON AFRICAN AMERICAN: 42 mL/min — AB (ref >60)
GLUCOSE: 84 mg/dL (ref 65–99)
Potassium: 4.3 mmol/L (ref 3.5–5.1)
Sodium: 137 mmol/L (ref 135–145)

## 2016-05-26 LAB — CBC
HCT: 44.9 % (ref 39.0–52.0)
Hemoglobin: 14.8 g/dL (ref 13.0–17.0)
MCH: 31.2 pg (ref 26.0–34.0)
MCHC: 33 g/dL (ref 30.0–36.0)
MCV: 94.7 fL (ref 78.0–100.0)
PLATELETS: 134 10*3/uL — AB (ref 150–400)
RBC: 4.74 MIL/uL (ref 4.22–5.81)
RDW: 14.9 % (ref 11.5–15.5)
WBC: 5.4 10*3/uL (ref 4.0–10.5)

## 2016-05-30 ENCOUNTER — Ambulatory Visit (HOSPITAL_COMMUNITY)
Admission: RE | Admit: 2016-05-30 | Discharge: 2016-05-30 | Disposition: A | Discharge status: Home / Self Care | Payer: Medicare Other | Source: Ambulatory Visit | Attending: Cardiology | Admitting: Cardiology

## 2016-05-30 ENCOUNTER — Ambulatory Visit (HOSPITAL_COMMUNITY): Payer: Medicare Other | Admitting: Anesthesiology

## 2016-05-30 ENCOUNTER — Telehealth: Payer: Self-pay

## 2016-05-30 ENCOUNTER — Ambulatory Visit (HOSPITAL_COMMUNITY): Admission: RE | Admit: 2016-05-30 | Payer: Medicare Other | Source: Ambulatory Visit | Admitting: Cardiology

## 2016-05-30 ENCOUNTER — Other Ambulatory Visit: Payer: Self-pay

## 2016-05-30 ENCOUNTER — Encounter (HOSPITAL_COMMUNITY): Payer: Self-pay | Admitting: *Deleted

## 2016-05-30 ENCOUNTER — Encounter (HOSPITAL_COMMUNITY)
Admission: RE | Disposition: A | Discharge status: Home / Self Care | Payer: Self-pay | Source: Ambulatory Visit | Attending: Cardiology

## 2016-05-30 ENCOUNTER — Encounter (HOSPITAL_COMMUNITY): Admission: RE | Payer: Self-pay | Source: Ambulatory Visit

## 2016-05-30 DIAGNOSIS — F17290 Nicotine dependence, other tobacco product, uncomplicated: Secondary | ICD-10-CM | POA: Insufficient documentation

## 2016-05-30 DIAGNOSIS — Z96653 Presence of artificial knee joint, bilateral: Secondary | ICD-10-CM | POA: Diagnosis not present

## 2016-05-30 DIAGNOSIS — I481 Persistent atrial fibrillation: Secondary | ICD-10-CM | POA: Diagnosis not present

## 2016-05-30 DIAGNOSIS — E785 Hyperlipidemia, unspecified: Secondary | ICD-10-CM | POA: Insufficient documentation

## 2016-05-30 DIAGNOSIS — I2510 Atherosclerotic heart disease of native coronary artery without angina pectoris: Secondary | ICD-10-CM | POA: Insufficient documentation

## 2016-05-30 DIAGNOSIS — E7800 Pure hypercholesterolemia, unspecified: Secondary | ICD-10-CM | POA: Diagnosis not present

## 2016-05-30 DIAGNOSIS — I110 Hypertensive heart disease with heart failure: Secondary | ICD-10-CM | POA: Diagnosis not present

## 2016-05-30 DIAGNOSIS — Z7901 Long term (current) use of anticoagulants: Secondary | ICD-10-CM | POA: Insufficient documentation

## 2016-05-30 DIAGNOSIS — I4891 Unspecified atrial fibrillation: Secondary | ICD-10-CM | POA: Diagnosis not present

## 2016-05-30 DIAGNOSIS — G4730 Sleep apnea, unspecified: Secondary | ICD-10-CM | POA: Diagnosis not present

## 2016-05-30 DIAGNOSIS — Z79899 Other long term (current) drug therapy: Secondary | ICD-10-CM | POA: Diagnosis not present

## 2016-05-30 DIAGNOSIS — I5022 Chronic systolic (congestive) heart failure: Secondary | ICD-10-CM | POA: Insufficient documentation

## 2016-05-30 HISTORY — PX: CARDIOVERSION: SHX1299

## 2016-05-30 LAB — PROTIME-INR
INR: 2.13
PROTHROMBIN TIME: 24.2 s — AB (ref 11.4–15.2)

## 2016-05-30 SURGERY — CARDIOVERSION
Anesthesia: Monitor Anesthesia Care

## 2016-05-30 MED ORDER — CARVEDILOL 25 MG PO TABS: 12.5000 mg | ORAL_TABLET | Freq: Two times a day (BID) | ORAL | 3 refills | Status: DC

## 2016-05-30 MED ORDER — LACTATED RINGERS IV SOLN
INTRAVENOUS | Status: DC
  Administered 2016-05-30: 10:00:00 via INTRAVENOUS

## 2016-05-30 MED ORDER — PROPOFOL 500 MG/50ML IV EMUL
INTRAVENOUS | Status: DC | PRN
  Administered 2016-05-30: 50 ug/kg/min via INTRAVENOUS

## 2016-05-30 MED ORDER — FENTANYL CITRATE (PF) 100 MCG/2ML IJ SOLN: 25.0000 ug | INTRAMUSCULAR | Status: DC | PRN

## 2016-05-30 MED ORDER — MIDAZOLAM HCL 2 MG/2ML IJ SOLN
1.0000 mg | INTRAMUSCULAR | Status: DC | PRN
  Administered 2016-05-30: 2 mg via INTRAVENOUS

## 2016-05-30 MED ORDER — PROPOFOL 10 MG/ML IV BOLUS
INTRAVENOUS | Status: DC | PRN
  Administered 2016-05-30 (×2): 20 mg via INTRAVENOUS

## 2016-05-30 MED ORDER — ONDANSETRON HCL 4 MG/2ML IJ SOLN: 4.0000 mg | Freq: Once | INTRAMUSCULAR | Status: DC | PRN

## 2016-05-30 MED ORDER — FENTANYL CITRATE (PF) 100 MCG/2ML IJ SOLN
25.0000 ug | INTRAMUSCULAR | Status: DC | PRN
  Administered 2016-05-30: 25 ug via INTRAVENOUS

## 2016-05-30 NOTE — Telephone Encounter (Signed)
INFORMED PT TO DECREASE COREG TO 12.5 MG TWO TIMES DAILY. He voiced understanding.

## 2016-05-30 NOTE — Anesthesia Preprocedure Evaluation (Signed)
Anesthesia Evaluation  Patient identified by MRN, date of birth, ID band Patient awake    Reviewed: Allergy & Precautions, H&P , NPO status , Patient's Chart, lab work & pertinent test results  Airway Mallampati: II  TM Distance: >3 FB Neck ROM: Full    Dental no notable dental hx. (+) Caps   Pulmonary shortness of breath and with exertion, sleep apnea and Continuous Positive Airway Pressure Ventilation , Current Smoker,    Pulmonary exam normal breath sounds clear to auscultation       Cardiovascular hypertension, Pt. on medications + CAD and +CHF  Normal cardiovascular exam+ dysrhythmias Atrial Fibrillation  Rhythm:Regular Rate:Normal     Neuro/Psych negative neurological ROS  negative psych ROS   GI/Hepatic negative GI ROS, Neg liver ROS,   Endo/Other  negative endocrine ROS  Renal/GU negative Renal ROS  negative genitourinary   Musculoskeletal negative musculoskeletal ROS (+)   Abdominal   Peds negative pediatric ROS (+)  Hematology negative hematology ROS (+)   Anesthesia Other Findings Upper front R cap  Reproductive/Obstetrics negative OB ROS                             Anesthesia Physical Anesthesia Plan  ASA: III  Anesthesia Plan: MAC   Post-op Pain Management:    Induction: Intravenous  Airway Management Planned: Simple Face Mask  Additional Equipment:   Intra-op Plan:   Post-operative Plan:   Informed Consent: I have reviewed the patients History and Physical, chart, labs and discussed the procedure including the risks, benefits and alternatives for the proposed anesthesia with the patient or authorized representative who has indicated his/her understanding and acceptance.   Dental advisory given  Plan Discussed with: CRNA  Anesthesia Plan Comments:         Anesthesia Quick Evaluation

## 2016-05-30 NOTE — Anesthesia Postprocedure Evaluation (Signed)
Anesthesia Post Note  Patient: Joshua [name]  Procedure(s) Performed: Procedure(s) (LRB): CARDIOVERSION (N/A)  Patient location during evaluation: PACU Anesthesia Type: MAC Level of consciousness: awake and patient cooperative Pain management: satisfactory to patient Vital Signs Assessment: post-procedure vital signs reviewed and stable Respiratory status: spontaneous breathing and non-rebreather facemask Cardiovascular status: stable Anesthetic complications: no    Last Vitals:  Vitals:   05/30/16 1015 05/30/16 1020  BP: 125/84 (!) 129/99  Resp: (!) 23 (!) 23  Temp:      Last Pain:  Vitals:   05/30/16 0925  TempSrc: Oral                 Sabreena Vogan

## 2016-05-30 NOTE — Transfer of Care (Signed)
Immediate Anesthesia Transfer of Care Note  Patient: Joshua [name]  Procedure(s) Performed: Procedure(s) (LRB): CARDIOVERSION (N/A)  Patient Location: PACU  Anesthesia Type: MAC  Level of Consciousness: awake  Airway & Oxygen Therapy: Patient Spontanous Breathing. Non Rrebreather  Post-op Assessment: Report given to PACU RN, Post -op Vital signs reviewed and stable and Patient moving all extremities  Post vital signs: Reviewed and stable  Complications: No apparent anesthesia complications

## 2016-05-30 NOTE — Addendum Note (Signed)
Addendum  created 05/30/16 1219 by Vista Deck, CRNA   Anesthesia Event edited, Anesthesia Staff edited

## 2016-05-30 NOTE — Progress Notes (Signed)
Electrical Cardioversion Procedure Note Joshua [name] [id number] Jul 08, 1941  Procedure: Electrical Cardioversion Indications:  Atrial Fibrillation  Procedure Details Consent: obtained in preop Time Out: Verified patient identification, verified procedure, site/side was marked, verified correct patient position, special equipment/implants available, medications/allergies/relevent history reviewed, required imaging and test results available.  Performed  Patient placed on cardiac monitor, pulse oximetry, supplemental oxygen as necessary.  Sedation given: see anesthesia record Pacer pads placed anterior and posterior chest.  Cardioverted 1 time(s).  Cardioverted at [name].  Evaluation Findings: Post procedure EKG shows: NSR Complications: None Patient did tolerate procedure well.    Joshua [name] 05/30/2016, 12:39 PM

## 2016-05-30 NOTE — Anesthesia Procedure Notes (Signed)
Procedure Name: MAC Date/Time: 05/30/2016 10:25 AM Performed by: Vista Deck Pre-anesthesia Checklist: Patient identified, Emergency Drugs available, Suction available, Timeout performed and Patient being monitored Patient Re-evaluated:Patient Re-evaluated prior to inductionOxygen Delivery Method: Non-rebreather mask

## 2016-05-30 NOTE — Discharge Instructions (Signed)
Electrical Cardioversion, Care After °Refer to this sheet in the next few weeks. These instructions provide you with information on caring for yourself after your procedure. Your health care provider may also give you more specific instructions. Your treatment has been planned according to current medical practices, but problems sometimes occur. Call your health care provider if you have any problems or questions after your procedure. °WHAT TO EXPECT AFTER THE PROCEDURE °After your procedure, it is typical to have the following sensations: °· Some redness on the skin where the shocks were delivered. If this is tender, a sunburn lotion or hydrocortisone cream may help. °· Possible return of an abnormal heart rhythm within hours or days after the procedure. °HOME CARE INSTRUCTIONS °· Take medicines only as directed by your health care provider. Be sure you understand how and when to take your medicine. °· Learn how to feel your pulse and check it often. °· Limit your activity for 48 hours after the procedure or as directed by your health care provider. °· Avoid or minimize caffeine and other stimulants as directed by your health care provider. °SEEK MEDICAL CARE IF: °· You feel like your heart is beating too fast or your pulse is not regular. °· You have any questions about your medicines. °· You have bleeding that will not stop. °SEEK IMMEDIATE MEDICAL CARE IF: °· You are dizzy or feel faint. °· It is hard to breathe or you feel short of breath. °· There is a change in discomfort in your chest. °· Your speech is slurred or you have trouble moving an arm or leg on one side of your body. °· You get a serious muscle cramp that does not go away. °· Your fingers or toes turn cold or blue. °  °This information is not intended to replace advice given to you by your health care provider. Make sure you discuss any questions you have with your health care provider. °  °Document Released: 07/31/2013 Document Revised: 10/31/2014  Document Reviewed: 07/31/2013 °Elsevier Interactive Patient Education ©2016 [employer] ° °

## 2016-05-30 NOTE — CV Procedure (Addendum)
Procedure performed: DCCV Physician: Dr Carlyle Dolly Indication: Afib   The patient was brought to the procedure suite after appropriate consent was obtained. Sedation was achieved with the assistance of anesthesiology, please refer to their note for full details. Cardiopulmonary monitoring was conducted throughout. Defib pads were placed in the anterior and posterior positions. With a single 200J synchronized shock he converted from afib to sinus bradycardi in the 50s. He tolerated the procedure well without complications. We will decrease his coreg to 12.5mg  bid due to bradycardia.    Zandra Abts MD

## 2016-05-30 NOTE — Progress Notes (Signed)
In PACU for cardioversion. Connected to monitors. O2 via Non-Rebreather Mask. IV infusing without difficulty. Timeout completed. Bilateral arm protected by pillows. Monitor displays a-fib. Pads placed to back and chest per Dr Harl Bowie after chest shaved. Shock of 200J given. Cardiac monitor displays sinus bradycardia. Post cardioversion EKG obtained and viewed per Dr Harl Bowie. Pt awake and oriented post procedure. Vital signs stable. Tolerated procedure well.

## 2016-06-01 ENCOUNTER — Ambulatory Visit (INDEPENDENT_AMBULATORY_CARE_PROVIDER_SITE_OTHER): Payer: Medicare Other | Admitting: *Deleted

## 2016-06-01 DIAGNOSIS — Z7901 Long term (current) use of anticoagulants: Secondary | ICD-10-CM | POA: Diagnosis not present

## 2016-06-01 DIAGNOSIS — I4891 Unspecified atrial fibrillation: Secondary | ICD-10-CM

## 2016-06-01 DIAGNOSIS — Z5181 Encounter for therapeutic drug level monitoring: Secondary | ICD-10-CM | POA: Diagnosis not present

## 2016-06-01 LAB — POCT INR: INR: 2.4

## 2016-06-02 ENCOUNTER — Encounter (HOSPITAL_COMMUNITY): Payer: Self-pay | Admitting: Cardiology

## 2016-06-06 ENCOUNTER — Encounter: Payer: Self-pay | Admitting: *Deleted

## 2016-06-06 NOTE — H&P (Signed)
Please refer to recent clinic note referenced below for full medical history. Patient presents for elective cardioversion in setting of persistent atrial fibrillation. His INRs have been therapeutic on coumadin over 3 weeks, will plan for DCCV today   Zandra Abts MD  Clinical Summary Joshua [name] is a 75 y.o.male seen today for follow up of the following medical problems.   1. Afib - recently started on amiodarone as outpatient - denies any recent palpitations.  - we had planned a DCCV however patient missed a few days of coumadin and had subtherapeutic INR and procedure was cancelled   2. Hyperlipidemia - was previously on prava, developed muscle aches. Stopped taking approx December, symptoms resolved. - reports he has tried multiple other statins with Dr Willey Blade, side effects with all.  - Feb 2016: TC 211 TG 389 HDL 31 LDL 102.  3. HTN - compliant with meds  4. Chronic systolic HF - echo 123456 LVEF 30-35%, severe basal infeiror hypokinesis. Cannot eval diasotlic function.  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 Atrial fibrillation ([place]) 06/2010  . BPH (benign prostatic hyperplasia)   . Chronic anticoagulation   . Degenerative joint disease    Right knee  . Elevated cholesterol   . Hyperlipidemia   . Hypertension   . Nocturia   . Shortness of breath    on exertion  . Sleep apnea    Rx-CPAP     No Known Allergies         Current Outpatient Prescriptions  Medication Sig Dispense Refill  . amiodarone (PACERONE) 200 MG tablet Take 1 tablet (200 mg total) by mouth 2 (two) times daily. 120 tablet 3  . carvedilol (COREG) 25 MG tablet Take 1 tablet (25 mg total) by mouth 2 (two) times daily. 180 tablet 3  . furosemide (LASIX) 40 MG tablet Take 1 tablet (40 mg total) by mouth every other day.  45 tablet 3  . lisinopril (PRINIVIL,ZESTRIL) 10 MG tablet Take 1 tablet (10 mg total) by mouth 2 (two) times daily. 180 tablet 3  . NONFORMULARY OR COMPOUNDED ITEM 1 application at bedtime. CPAP    . Omega-3 Fatty Acids (FISH OIL) 1000 MG CAPS Take 1,000 mg by mouth 2 (two) times daily.     . potassium chloride SA (KLOR-CON M20) 20 MEQ tablet Take 1 tablet (20 mEq total) by mouth every other day. 45 tablet 3  . warfarin (COUMADIN) 10 MG tablet TAKE ONE TABLET (10 mg) BY MOUTH ONCE DAILY EXCEPT ONE-HALF TAB  (5 mg) ON SUNDAYS, TUESDAYS, AND THURSDAYS 45 tablet 4   No current facility-administered medications for this visit.           Past Surgical History:  Procedure Laterality Date  . CARDIAC CATHETERIZATION N/A 03/04/2016   Procedure: Left Heart Cath and Coronary Angiography;  Surgeon: Troy Sine, MD;  Location: [facility];  Service: Cardiovascular;  Laterality: N/A;  . COLONOSCOPY  03/10/2005  . 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 TOTAL KNEE ARTHROPLASTY Left 03/10/2014   Procedure: LEFT TOTAL KNEE ARTHROPLASTY;  Surgeon: Gearlean Alf, MD;  Location: [facility];  Service: Orthopedics;  Laterality: Left;  Marland Kitchen VENTRAL HERNIA REPAIR  03/2008, 02/2009  No Known Allergies         Family History  Problem Relation Age of Onset  . Heart attack Father      Social History Joshua [name] reports that he has been smoking Cigars.  He has never used smokeless tobacco. Joshua [name] reports that he does not drink alcohol.   Review of Systems CONSTITUTIONAL: No weight loss, fever, chills, weakness or fatigue.  HEENT: Eyes: No visual loss, blurred vision, double vision or yellow sclerae.No hearing loss, sneezing, congestion, runny nose or sore throat.  SKIN: No rash or itching.  CARDIOVASCULAR: per HPI RESPIRATORY: No  shortness of breath, cough or sputum.  GASTROINTESTINAL: No anorexia, nausea, vomiting or diarrhea. No abdominal pain or blood.  GENITOURINARY: No burning on urination, no polyuria NEUROLOGICAL: No headache, dizziness, syncope, paralysis, ataxia, numbness or tingling in the extremities. No change in bowel or bladder control.  MUSCULOSKELETAL: No muscle, back pain, joint pain or stiffness.  LYMPHATICS: No enlarged nodes. No history of splenectomy.  PSYCHIATRIC: No history of depression or anxiety.  ENDOCRINOLOGIC: No reports of sweating, cold or heat intolerance. No polyuria or polydipsia.  Marland Kitchen   Physical Examination    Vitals:   05/16/16 1609  BP: 130/90  Pulse: (!) 101      Vitals:   05/16/16 1609  Weight: 238 lb (108 kg)  Height: 5\' 10"  (1.778 m)    Gen: resting comfortably, no acute distress HEENT: no scleral icterus, pupils equal round and reactive, no palptable cervical adenopathy,  CV: irreg,rate 100, no m/r/g, no jvd Resp: Clear to auscultation bilaterally GI: abdomen is soft, non-tender, non-distended, normal bowel sounds, no hepatosplenomegaly MSK: extremities are warm, no edema.  Skin: warm, no rash Neuro:  no focal deficits Psych: appropriate affect   Diagnostic Studies Echo 09/2010: LVEF 55%, mild LVH, mild MR, mod LAE.   02/2016 Echo Study Conclusions  - Left ventricle: The cavity size was moderately dilated. Wall  thickness was at the upper limits of normal. Systolic function  was moderately to severely reduced. The estimated ejection  fraction was in the range of 30% to 35%. Diffuse hypokinesis.  There is severe hypokinesis of the basalinferior myocardium. The  study is not technically sufficient to allow evaluation of LV  diastolic function. - Mitral valve: Mildly thickened leaflets . There was mild  regurgitation. - Left atrium: The atrium was severely dilated. - Right ventricle: The cavity size was mildly dilated. Systolic   function was mildly reduced. - Right atrium: The atrium was severely dilated. - Atrial septum: No defect or patent foramen ovale was identified. - Tricuspid valve: There was trivial regurgitation. Peak RV-RA  gradient (S): 26 mm Hg. - Pericardium, extracardiac: There was no pericardial effusion.  Impressions:  - Upper normal LV wall thickness with moderate chamber dilatation  and LVEF approximately 30-35% in the setting of atrial  fibrillation. LVEF decreased compared to previous study in 2011.  Diffuse hypokinesis, most prominent in the inferior wall.  Indeterminate diastolic function. Severe biatrial enlargement.  Mildly thickened mitral leaflets with mild mitral regurgitation.  Mildly dilated RV with reduced contraction. Trivial tricuspid  regurgitation, RV-RA gradient 26 mmHg. Unable to assess CVP.  02/2016 Cath  Prox LAD lesion, 10% stenosed.  Mid LAD lesion, 20% stenosed.  Ost 3rd Diag lesion, 40% stenosed.  Prox RCA lesion, 20% stenosed.   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 Left ventriculography was not done.   RECOMMENDATION: Medical therapy for his cardiomyopathy with improved rate control of his atrial fibrillation.     Assessment and Plan   1. Afib - ekg in clinic today shows continued afib, he has not converted on amio. We will try again to schedule a DCCV.  - decrease amio to 200mg  daily - f/u INR this coming up Monday, if therapeutic schedule DCCV.   2. Hyperlipidemia - intolerant to statins,he will continue dietary modifcation  3. HTN - at goal, continue current meds  4. Chronic systolic HF - probable tachycardia medicated CM, his afib remains poorly controlled. We will try again for DCCV.  - increase lisinopril to 20mg  bid. Can consider entresto  in the future, especially if LVEF does not improve with better afib control.    F/u 1 month  Arnoldo Lenis, M.D.

## 2016-06-08 ENCOUNTER — Encounter (HOSPITAL_COMMUNITY): Payer: Self-pay | Admitting: Emergency Medicine

## 2016-06-08 ENCOUNTER — Emergency Department (HOSPITAL_COMMUNITY): Payer: Medicare Other

## 2016-06-08 ENCOUNTER — Ambulatory Visit (INDEPENDENT_AMBULATORY_CARE_PROVIDER_SITE_OTHER): Payer: Medicare Other | Admitting: *Deleted

## 2016-06-08 ENCOUNTER — Emergency Department (HOSPITAL_COMMUNITY)
Admission: EM | Admit: 2016-06-08 | Discharge: 2016-06-08 | Disposition: A | Discharge status: Home / Self Care | Payer: Medicare Other | Attending: Emergency Medicine | Admitting: Emergency Medicine

## 2016-06-08 DIAGNOSIS — N3091 Cystitis, unspecified with hematuria: Secondary | ICD-10-CM | POA: Insufficient documentation

## 2016-06-08 DIAGNOSIS — R319 Hematuria, unspecified: Secondary | ICD-10-CM | POA: Diagnosis present

## 2016-06-08 DIAGNOSIS — R109 Unspecified abdominal pain: Secondary | ICD-10-CM

## 2016-06-08 DIAGNOSIS — I110 Hypertensive heart disease with heart failure: Secondary | ICD-10-CM | POA: Diagnosis not present

## 2016-06-08 DIAGNOSIS — Z7901 Long term (current) use of anticoagulants: Secondary | ICD-10-CM | POA: Insufficient documentation

## 2016-06-08 DIAGNOSIS — F17210 Nicotine dependence, cigarettes, uncomplicated: Secondary | ICD-10-CM | POA: Diagnosis not present

## 2016-06-08 DIAGNOSIS — I4891 Unspecified atrial fibrillation: Secondary | ICD-10-CM | POA: Insufficient documentation

## 2016-06-08 DIAGNOSIS — Z79899 Other long term (current) drug therapy: Secondary | ICD-10-CM | POA: Diagnosis not present

## 2016-06-08 DIAGNOSIS — Z5181 Encounter for therapeutic drug level monitoring: Secondary | ICD-10-CM

## 2016-06-08 DIAGNOSIS — N200 Calculus of kidney: Secondary | ICD-10-CM | POA: Diagnosis not present

## 2016-06-08 DIAGNOSIS — I509 Heart failure, unspecified: Secondary | ICD-10-CM | POA: Diagnosis not present

## 2016-06-08 DIAGNOSIS — I2510 Atherosclerotic heart disease of native coronary artery without angina pectoris: Secondary | ICD-10-CM | POA: Insufficient documentation

## 2016-06-08 DIAGNOSIS — N3090 Cystitis, unspecified without hematuria: Secondary | ICD-10-CM

## 2016-06-08 LAB — CBC WITH DIFFERENTIAL/PLATELET
BASOS ABS: 0 10*3/uL (ref 0.0–0.1)
Basophils Relative: 1 %
EOS PCT: 2 %
Eosinophils Absolute: 0.1 10*3/uL (ref 0.0–0.7)
HEMATOCRIT: 41.6 % (ref 39.0–52.0)
Hemoglobin: 13.6 g/dL (ref 13.0–17.0)
LYMPHS PCT: 30 %
Lymphs Abs: 1.3 10*3/uL (ref 0.7–4.0)
MCH: 31.1 pg (ref 26.0–34.0)
MCHC: 32.7 g/dL (ref 30.0–36.0)
MCV: 95.2 fL (ref 78.0–100.0)
Monocytes Absolute: 0.4 10*3/uL (ref 0.1–1.0)
Monocytes Relative: 8 %
NEUTROS ABS: 2.7 10*3/uL (ref 1.7–7.7)
NEUTROS PCT: 59 %
PLATELETS: 134 10*3/uL — AB (ref 150–400)
RBC: 4.37 MIL/uL (ref 4.22–5.81)
RDW: 14.8 % (ref 11.5–15.5)
WBC: 4.4 10*3/uL (ref 4.0–10.5)

## 2016-06-08 LAB — URINALYSIS, ROUTINE W REFLEX MICROSCOPIC
Bilirubin Urine: NEGATIVE
Glucose, UA: NEGATIVE mg/dL
Ketones, ur: NEGATIVE mg/dL
Nitrite: NEGATIVE
PROTEIN: 100 mg/dL — AB
Specific Gravity, Urine: 1.02 (ref 1.005–1.030)
pH: 7 (ref 5.0–8.0)

## 2016-06-08 LAB — POCT INR: INR: 2.7

## 2016-06-08 LAB — URINE MICROSCOPIC-ADD ON: SQUAMOUS EPITHELIAL / LPF: NONE SEEN

## 2016-06-08 LAB — PROTIME-INR
INR: 2.38
Prothrombin Time: 26.4 seconds — ABNORMAL HIGH (ref 11.4–15.2)

## 2016-06-08 LAB — BASIC METABOLIC PANEL
ANION GAP: 5 (ref 5–15)
BUN: 19 mg/dL (ref 6–20)
CO2: 26 mmol/L (ref 22–32)
Calcium: 8.7 mg/dL — ABNORMAL LOW (ref 8.9–10.3)
Chloride: 108 mmol/L (ref 101–111)
Creatinine, Ser: 1.11 mg/dL (ref 0.61–1.24)
GFR calc Af Amer: >60 mL/min (ref >60)
Glucose, Bld: 101 mg/dL — ABNORMAL HIGH (ref 65–99)
POTASSIUM: 4.4 mmol/L (ref 3.5–5.1)
SODIUM: 139 mmol/L (ref 135–145)

## 2016-06-08 MED ORDER — CEPHALEXIN 500 MG PO CAPS: 500.0000 mg | ORAL_CAPSULE | Freq: Four times a day (QID) | ORAL | 0 refills | Status: DC

## 2016-06-08 NOTE — ED Notes (Signed)
Pt placed on cardiac monitor 

## 2016-06-08 NOTE — ED Notes (Signed)
Pt returned from CT °

## 2016-06-08 NOTE — ED Notes (Signed)
Pt taken to CT.

## 2016-06-08 NOTE — Discharge Instructions (Signed)
Take the prescription as directed.  Call your regular medical doctor and the Urologist today to schedule a follow up appointment within the week.  Return to the Emergency Department immediately sooner if worsening.

## 2016-06-08 NOTE — ED Provider Notes (Signed)
[facility] DEPT Provider Note   CSN: [id number] Arrival date & time: 06/08/16  [id number]     History   Chief Complaint Chief Complaint  Patient presents with  . Hematuria    HPI Joshua [name] is a 75 y.o. male.   Hematuria     Pt was seen at 0935. Per pt, c/o gradual onset and persistence of constant hematuria since yesterday. Has been associated with right sided low back "pain." Denies dysuria, no abd pain, no N/V/D, no fevers, no testicular pain/swelling, no other areas of bleeding or bruising.    Past Medical History:  Diagnosis Date  . Atrial fibrillation ([place]) 06/2010  . BPH (benign prostatic hyperplasia)   . Chronic anticoagulation   . Degenerative joint disease    Right knee  . Dysrhythmia   . Elevated cholesterol   . Hyperlipidemia   . Hypertension   . Nocturia   . Shortness of breath    on exertion  . Sleep apnea    Rx-CPAP    Patient Active Problem List   Diagnosis Date Noted  . CAD- minor CAD at cath 03/04/16 03/06/2016  . Acute CHF - secondary to AF with RVR 03/06/2016  . AF (atrial fibrillation) ([place]) 03/04/2016  . Abnormal nuclear stress test   . PAF (paroxysmal atrial fibrillation) ([place])   . Non-ischemic cardiomyopathy ([place])   . Edema 03/31/2014  . Encounter for therapeutic drug monitoring 12/23/2013  . Knee stiffness 02/06/2013  . Knee pain 02/06/2013  . Difficulty in walking(719.7) 02/06/2013  . Acute blood loss anemia 01/17/2013  . Postop Hyponatremia 01/15/2013  . OA (osteoarthritis) of knee 01/14/2013  . Thrombocytopenia ([place]) 05/26/2011  . Sleep apnea   . Degenerative joint disease   . Chronic anticoagulation 02/02/2011  . Hyperlipidemia 07/29/2010  . Hypertension 12/07/2007    Past Surgical History:  Procedure Laterality Date  . CARDIAC CATHETERIZATION N/A 03/04/2016   Procedure: Left Heart Cath and Coronary Angiography;  Surgeon: Troy Sine, MD;  Location: [place] [facility];  Service: Cardiovascular;  Laterality: N/A;    . CARDIOVERSION N/A 05/30/2016   Procedure: CARDIOVERSION;  Surgeon: Arnoldo Lenis, MD;  Location: [facility];  Service: Endoscopy;  Laterality: N/A;  . COLONOSCOPY  03/10/2005  . 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 TOTAL KNEE ARTHROPLASTY Left 03/10/2014   Procedure: LEFT TOTAL KNEE ARTHROPLASTY;  Surgeon: Gearlean Alf, MD;  Location: [facility];  Service: Orthopedics;  Laterality: Left;  Marland Kitchen VENTRAL HERNIA REPAIR  03/2008, 02/2009       Home Medications    Prior to Admission medications   Medication Sig Start Date End Date Taking? Authorizing Provider  amiodarone (PACERONE) 200 MG tablet Take 1 tablet (200 mg total) by mouth daily. 05/16/16  Yes Arnoldo Lenis, MD  carvedilol (COREG) 25 MG tablet Take 0.5 tablets (12.5 mg total) by mouth 2 (two) times daily. 05/30/16  Yes Arnoldo Lenis, MD  lisinopril (PRINIVIL,ZESTRIL) 20 MG tablet Take 1 tablet (20 mg total) by mouth 2 (two) times daily. 05/16/16 08/14/16 Yes Arnoldo Lenis, MD  Omega-3 Fatty Acids (FISH OIL) 1000 MG CAPS Take 1,000 mg by mouth 2 (two) times daily.    Yes Historical Provider, MD  warfarin (COUMADIN) 10 MG tablet Take 10 mg by mouth as directed.   Yes Historical Provider, MD  furosemide (LASIX) 40 MG tablet Take  1 tablet (40 mg total) by mouth every other day. 04/13/16   Arnoldo Lenis, MD  NONFORMULARY OR COMPOUNDED ITEM 1 application at bedtime. CPAP    Historical Provider, MD  potassium chloride SA (KLOR-CON M20) 20 MEQ tablet Take 1 tablet (20 mEq total) by mouth every other day. 04/13/16   Arnoldo Lenis, MD    Family History Family History  Problem Relation Age of Onset  . Heart attack Father     Social History Social History  Substance Use Topics  . Smoking status: Light Tobacco Smoker    Types: Cigars  . Smokeless tobacco: Never Used     Comment: Occasional  cigar twice a year   . Alcohol use No     Allergies   Review of patient's allergies indicates no known allergies.   Review of Systems Review of Systems  Genitourinary: Positive for hematuria.  ROS: Statement: All systems negative except as marked or noted in the HPI; Constitutional: Negative for fever and chills. ; ; Eyes: Negative for eye pain, redness and discharge. ; ; ENMT: Negative for ear pain, hoarseness, nasal congestion, sinus pressure and sore throat. ; ; Cardiovascular: Negative for chest pain, palpitations, diaphoresis, dyspnea and peripheral edema. ; ; Respiratory: Negative for cough, wheezing and stridor. ; ; Gastrointestinal: Negative for nausea, vomiting, diarrhea, abdominal pain, blood in stool, hematemesis, jaundice and rectal bleeding. . ; ; Genitourinary: Negative for dysuria, +hematuria. ; ; Genital:  No penile drainage or rash, no testicular pain or swelling, no scrotal rash or swelling. ;; Musculoskeletal: +LBP. Negative for neck pain. Negative for swelling and trauma.; ; Skin: Negative for pruritus, rash, abrasions, blisters, bruising and skin lesion.; ; Neuro: Negative for headache, lightheadedness and neck stiffness. Negative for weakness, altered level of consciousness, altered mental status, extremity weakness, paresthesias, involuntary movement, seizure and syncope.       Physical Exam Updated Vital Signs BP 161/86   Pulse (!) 49 Comment: Simultaneous filing. User may not have seen previous data.  Temp 97.9 F (36.6 C) (Oral)   Resp 26   Ht 5\' 10"  (1.778 m)   Wt 235 lb (106.6 kg)   SpO2 97% Comment: Simultaneous filing. User may not have seen previous data.  BMI 33.72 kg/m   Physical Exam 0940: Physical examination:  Nursing notes reviewed; Vital signs and O2 SAT reviewed;  Constitutional: Well developed, Well nourished, Well hydrated, In no acute distress; Head:  Normocephalic, atraumatic; Eyes: EOMI, PERRL, No scleral icterus; ENMT: Mouth and pharynx  normal, Mucous membranes moist; Neck: Supple, Full range of motion, No lymphadenopathy; Cardiovascular: Regular rate and rhythm, No gallop; Respiratory: Breath sounds clear & equal bilaterally, No wheezes.  Speaking full sentences with ease, Normal respiratory effort/excursion; Chest: Nontender, Movement normal; Abdomen: Soft, Nontender, Nondistended, Normal bowel sounds;  Spine:  No midline CS, TS, LS tenderness.;; Genitourinary: No CVA tenderness; Extremities: Pulses normal, No tenderness, No edema, No calf edema or asymmetry.; Neuro: AA&Ox3, Major CN grossly intact.  Speech clear. No gross focal motor or sensory deficits in extremities.; Skin: Color normal, Warm, Dry.   ED Treatments / Results  Labs (all labs ordered are listed, but only abnormal results are displayed)   EKG  EKG Interpretation None       Radiology   Procedures Procedures (including critical care time)  Medications Ordered in ED Medications - No data to display   Initial Impression / Assessment and Plan / ED Course  I have reviewed the triage vital signs and the  nursing notes.  Pertinent labs & imaging results that were available during my care of the patient were reviewed by me and considered in my medical decision making (see chart for details).  MDM Reviewed: previous chart, nursing note and vitals Reviewed previous: labs Interpretation: labs and CT scan   Results for orders placed or performed during the hospital encounter of 06/08/16  Urinalysis, Routine w reflex microscopic  Result Value Ref Range   Color, Urine RED (A) YELLOW   APPearance CLOUDY (A) CLEAR   Specific Gravity, Urine 1.020 1.005 - 1.030   pH 7.0 5.0 - 8.0   Glucose, UA NEGATIVE NEGATIVE mg/dL   Hgb urine dipstick LARGE (A) NEGATIVE   Bilirubin Urine NEGATIVE NEGATIVE   Ketones, ur NEGATIVE NEGATIVE mg/dL   Protein, ur 100 (A) NEGATIVE mg/dL   Nitrite NEGATIVE NEGATIVE   Leukocytes, UA TRACE (A) NEGATIVE  Basic metabolic panel   Result Value Ref Range   Sodium 139 135 - 145 mmol/L   Potassium 4.4 3.5 - 5.1 mmol/L   Chloride 108 101 - 111 mmol/L   CO2 26 22 - 32 mmol/L   Glucose, Bld 101 (H) 65 - 99 mg/dL   BUN 19 6 - 20 mg/dL   Creatinine, Ser 1.11 0.61 - 1.24 mg/dL   Calcium 8.7 (L) 8.9 - 10.3 mg/dL   GFR calc non Af Amer >60 >60 mL/min   GFR calc Af Amer >60 >60 mL/min   Anion gap 5 5 - 15  CBC with Differential  Result Value Ref Range   WBC 4.4 4.0 - 10.5 K/uL   RBC 4.37 4.22 - 5.81 MIL/uL   Hemoglobin 13.6 13.0 - 17.0 g/dL   HCT 41.6 39.0 - 52.0 %   MCV 95.2 78.0 - 100.0 fL   MCH 31.1 26.0 - 34.0 pg   MCHC 32.7 30.0 - 36.0 g/dL   RDW 14.8 11.5 - 15.5 %   Platelets 134 (L) 150 - 400 K/uL   Neutrophils Relative % 59 %   Neutro Abs 2.7 1.7 - 7.7 K/uL   Lymphocytes Relative 30 %   Lymphs Abs 1.3 0.7 - 4.0 K/uL   Monocytes Relative 8 %   Monocytes Absolute 0.4 0.1 - 1.0 K/uL   Eosinophils Relative 2 %   Eosinophils Absolute 0.1 0.0 - 0.7 K/uL   Basophils Relative 1 %   Basophils Absolute 0.0 0.0 - 0.1 K/uL  Protime-INR  Result Value Ref Range   Prothrombin Time 26.4 (H) 11.4 - 15.2 seconds   INR 2.38   Urine microscopic-add on  Result Value Ref Range   Squamous Epithelial / LPF NONE SEEN NONE SEEN   WBC, UA 6-30 0 - 5 WBC/hpf   RBC / HPF TOO NUMEROUS TO COUNT 0 - 5 RBC/hpf   Bacteria, UA FEW (A) NONE SEEN   Ct Renal Stone Study Result Date: 06/08/2016 CLINICAL DATA:  Right-sided flank pain and hematuria for 1 day EXAM: CT ABDOMEN AND PELVIS WITHOUT CONTRAST TECHNIQUE: Multidetector CT imaging of the abdomen and pelvis was performed following the standard protocol without IV contrast. COMPARISON:  12/12/2014 FINDINGS: Lower chest: Lung bases are free of acute infiltrate or sizable effusion. Coronary calcifications are noted. Hepatobiliary: No mass visualized on this un-enhanced exam. Pancreas: No mass or inflammatory process identified on this un-enhanced exam. Spleen: Within normal limits in  size. Adrenals/Urinary Tract: The adrenals are within normal limits. A tiny nonobstructing upper pole stone is noted on the right. No left renal calculi are  seen. Some scarring remains in the right kidney. The bladder is decompressed. Stomach/Bowel: Scattered diverticular changes noted without evidence of diverticulitis. The appendix is within normal limits. Vascular/Lymphatic: No pathologically enlarged lymph nodes. No evidence of abdominal aortic aneurysm. Reproductive: Scattered prostatic calcifications are seen. Other: None. Musculoskeletal:  No suspicious bone lesions identified. IMPRESSION: Tiny nonobstructing right renal stone. Diverticulosis without diverticulitis. No other focal abnormality is noted Electronically Signed   By: Inez Catalina M.D.   On: 06/08/2016 10:56    1130:  HR per baseline, per EPIC chart review. INR therapeutic, H/H and BUN/Cr stable. CT reassuring. Will tx for UTI while UC pending. Pt has gotten himself dressed, is sitting in a chair in the exam room, states he "feels ok" and wants to go home now. Dx and testing d/w pt.  Questions answered.  Verb understanding, agreeable to d/c home with outpt f/u.    Final Clinical Impressions(s) / ED Diagnoses   Final diagnoses:  Flank pain  Hematuria    New Prescriptions New Prescriptions   No medications on file      Francine Graven, DO 06/11/16 [id number]

## 2016-06-08 NOTE — ED Triage Notes (Signed)
Pt reports urinating bright red blood since yesterday, no clots noticed.  Pt has also experienced pain in right flank.  Denies n/v/d/fever.

## 2016-06-08 NOTE — ED Notes (Signed)
Dr Thurnell Garbe aware of HR in 40's

## 2016-06-09 LAB — URINE CULTURE: CULTURE: NO GROWTH

## 2016-06-13 ENCOUNTER — Ambulatory Visit (INDEPENDENT_AMBULATORY_CARE_PROVIDER_SITE_OTHER): Payer: Medicare Other | Admitting: *Deleted

## 2016-06-13 DIAGNOSIS — Z7901 Long term (current) use of anticoagulants: Secondary | ICD-10-CM | POA: Diagnosis not present

## 2016-06-13 DIAGNOSIS — I4891 Unspecified atrial fibrillation: Secondary | ICD-10-CM | POA: Diagnosis not present

## 2016-06-13 DIAGNOSIS — Z5181 Encounter for therapeutic drug level monitoring: Secondary | ICD-10-CM | POA: Diagnosis not present

## 2016-06-13 LAB — POCT INR: INR: 2.5

## 2016-07-01 ENCOUNTER — Encounter: Payer: Self-pay | Admitting: Cardiology

## 2016-07-01 ENCOUNTER — Ambulatory Visit (INDEPENDENT_AMBULATORY_CARE_PROVIDER_SITE_OTHER): Payer: Medicare Other | Admitting: Cardiology

## 2016-07-01 VITALS — BP 164/95 | HR 98 | Ht 71.0 in | Wt 241.0 lb

## 2016-07-01 DIAGNOSIS — I480 Paroxysmal atrial fibrillation: Secondary | ICD-10-CM

## 2016-07-01 MED ORDER — AMIODARONE HCL 200 MG PO TABS: 200.0000 mg | ORAL_TABLET | Freq: Two times a day (BID) | ORAL | Status: DC

## 2016-07-01 MED ORDER — CARVEDILOL 25 MG PO TABS: 25.0000 mg | ORAL_TABLET | Freq: Two times a day (BID) | ORAL | 3 refills | Status: DC

## 2016-07-01 NOTE — Progress Notes (Signed)
Clinical Summary Mr. Crumity is a 75 y.o.male seen today for follow up of the following medical problems. This is a focused visit on his history of afib.   1. Afib - recently started on amiodarone as outpatient - denies any recent palpitations.  - we had planned a DCCV however patient missed a few days of coumadin and had subtherapeutic INR and procedure was cancelled  - s/p DCCV 05/30/16. Postconversion he had some bradycardia and we lowered his coreg to 12.5mg  bid.  - recent hematuria that was transient, he remains on anticoag.  - no recent palpitations since last visit.     Past Medical History:  Diagnosis Date  . Atrial fibrillation ([place]) 06/2010  . BPH (benign prostatic hyperplasia)   . Chronic anticoagulation   . Degenerative joint disease    Right knee  . Dysrhythmia   . Elevated cholesterol   . Hyperlipidemia   . Hypertension   . Nocturia   . Shortness of breath    on exertion  . Sleep apnea    Rx-CPAP     No Known Allergies   Current Outpatient Prescriptions  Medication Sig Dispense Refill  . amiodarone (PACERONE) 200 MG tablet Take 1 tablet (200 mg total) by mouth daily. 90 tablet 3  . carvedilol (COREG) 25 MG tablet Take 0.5 tablets (12.5 mg total) by mouth 2 (two) times daily. 90 tablet 3  . cephALEXin (KEFLEX) 500 MG capsule Take 1 capsule (500 mg total) by mouth 4 (four) times daily. 40 capsule 0  . furosemide (LASIX) 40 MG tablet Take 1 tablet (40 mg total) by mouth every other day. 45 tablet 3  . lisinopril (PRINIVIL,ZESTRIL) 20 MG tablet Take 1 tablet (20 mg total) by mouth 2 (two) times daily. 90 tablet 3  . NONFORMULARY OR COMPOUNDED ITEM 1 application at bedtime. CPAP    . Omega-3 Fatty Acids (FISH OIL) 1000 MG CAPS Take 1,000 mg by mouth 2 (two) times daily.     . potassium chloride SA (KLOR-CON M20) 20 MEQ tablet Take 1 tablet (20 mEq total) by mouth every other day. 45 tablet 3  . warfarin (COUMADIN) 10 MG tablet Take 10 mg by mouth as  directed.     No current facility-administered medications for this visit.      Past Surgical History:  Procedure Laterality Date  . CARDIAC CATHETERIZATION N/A 03/04/2016   Procedure: Left Heart Cath and Coronary Angiography;  Surgeon: Troy Sine, MD;  Location: [place] [facility];  Service: Cardiovascular;  Laterality: N/A;  . CARDIOVERSION N/A 05/30/2016   Procedure: CARDIOVERSION;  Surgeon: Arnoldo Lenis, MD;  Location: [facility];  Service: Endoscopy;  Laterality: N/A;  . COLONOSCOPY  03/10/2005  . 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 TOTAL KNEE ARTHROPLASTY Left 03/10/2014   Procedure: LEFT TOTAL KNEE ARTHROPLASTY;  Surgeon: Gearlean Alf, MD;  Location: [facility];  Service: Orthopedics;  Laterality: Left;  Marland Kitchen VENTRAL HERNIA REPAIR  03/2008, 02/2009     No Known Allergies    Family History  Problem Relation Age of Onset  . Heart attack Father      Social History Mr. Schwind reports that he has been smoking Cigars.  He has never used smokeless tobacco. Mr. Lemar reports that he does not drink alcohol.   Review of Systems CONSTITUTIONAL: No weight loss, fever, chills,  weakness or fatigue.  HEENT: Eyes: No visual loss, blurred vision, double vision or yellow sclerae.No hearing loss, sneezing, congestion, runny nose or sore throat.  SKIN: No rash or itching.  CARDIOVASCULAR: per HPI RESPIRATORY: No shortness of breath, cough or sputum.  GASTROINTESTINAL: No anorexia, nausea, vomiting or diarrhea. No abdominal pain or blood.  GENITOURINARY: No burning on urination, no polyuria NEUROLOGICAL: No headache, dizziness, syncope, paralysis, ataxia, numbness or tingling in the extremities. No change in bowel or bladder control.  MUSCULOSKELETAL: No muscle, back pain, joint pain or stiffness.  LYMPHATICS: No enlarged nodes. No history of  splenectomy.  PSYCHIATRIC: No history of depression or anxiety.  ENDOCRINOLOGIC: No reports of sweating, cold or heat intolerance. No polyuria or polydipsia.  Marland Kitchen   Physical Examination Vitals:   07/01/16 1601  BP: (!) 164/95  Pulse: 98   Vitals:   07/01/16 1601  Weight: 241 lb (109.3 kg)  Height: 5\' 11"  (1.803 m)    Gen: resting comfortably, no acute distress HEENT: no scleral icterus, pupils equal round and reactive, no palptable cervical adenopathy,  CV: irreg, no m/r/g, no jvd Resp: Clear to auscultation bilaterally GI: abdomen is soft, non-tender, non-distended, normal bowel sounds, no hepatosplenomegaly MSK: extremities are warm, no edema.  Skin: warm, no rash Neuro:  no focal deficits Psych: appropriate affect   Diagnostic Studies Echo 09/2010: LVEF 55%, mild LVH, mild MR, mod LAE.   02/2016 Echo Study Conclusions  - Left ventricle: The cavity size was moderately dilated. Wall  thickness was at the upper limits of normal. Systolic function  was moderately to severely reduced. The estimated ejection  fraction was in the range of 30% to 35%. Diffuse hypokinesis.  There is severe hypokinesis of the basalinferior myocardium. The  study is not technically sufficient to allow evaluation of LV  diastolic function. - Mitral valve: Mildly thickened leaflets . There was mild  regurgitation. - Left atrium: The atrium was severely dilated. - Right ventricle: The cavity size was mildly dilated. Systolic  function was mildly reduced. - Right atrium: The atrium was severely dilated. - Atrial septum: No defect or patent foramen ovale was identified. - Tricuspid valve: There was trivial regurgitation. Peak RV-RA  gradient (S): 26 mm Hg. - Pericardium, extracardiac: There was no pericardial effusion.  Impressions:  - Upper normal LV wall thickness with moderate chamber dilatation  and LVEF approximately 30-35% in the setting of atrial  fibrillation. LVEF  decreased compared to previous study in 2011.  Diffuse hypokinesis, most prominent in the inferior wall.  Indeterminate diastolic function. Severe biatrial enlargement.  Mildly thickened mitral leaflets with mild mitral regurgitation.  Mildly dilated RV with reduced contraction. Trivial tricuspid  regurgitation, RV-RA gradient 26 mmHg. Unable to assess CVP.  02/2016 Cath  Prox LAD lesion, 10% stenosed.  Mid LAD lesion, 20% stenosed.  Ost 3rd Diag lesion, 40% stenosed.  Prox RCA lesion, 20% stenosed.   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 Left ventriculography was not done.   RECOMMENDATION: Medical therapy for his cardiomyopathy with improved rate control of his atrial fibrillation.     Assessment and Plan  1. Afib - difficult to control. Started on amio with recent DCCV, ekg in clinic today shows he is back in afib - we will increase coreg to 25mg  bid, increase amio to 200mg  bid for 3 weeks then 200mg   daily - if continues to fail amio will have to consider pure rate control strategy vs ablation.       Arnoldo Lenis, M.D.

## 2016-07-01 NOTE — Patient Instructions (Signed)
Medication Instructions:   Increase Coreg to 25mg  twice a day - new prescription placed on file at pharmacy.  Increase Amiodarone to 200mg  twice a day  X 3 weeks only, then back to daily thereafter.  Continue all other medications.    Labwork: none  Testing/Procedures: none  Follow-Up: 3 weeks   Any Other Special Instructions Will Be Listed Below (If Applicable).  If you need a refill on your cardiac medications before your next appointment, please call your pharmacy.

## 2016-07-04 DIAGNOSIS — Z79899 Other long term (current) drug therapy: Secondary | ICD-10-CM | POA: Diagnosis not present

## 2016-07-04 DIAGNOSIS — E785 Hyperlipidemia, unspecified: Secondary | ICD-10-CM | POA: Diagnosis not present

## 2016-07-11 ENCOUNTER — Ambulatory Visit (INDEPENDENT_AMBULATORY_CARE_PROVIDER_SITE_OTHER): Payer: Medicare Other | Admitting: *Deleted

## 2016-07-11 DIAGNOSIS — Z7901 Long term (current) use of anticoagulants: Secondary | ICD-10-CM | POA: Diagnosis not present

## 2016-07-11 DIAGNOSIS — Z5181 Encounter for therapeutic drug level monitoring: Secondary | ICD-10-CM

## 2016-07-11 DIAGNOSIS — I4891 Unspecified atrial fibrillation: Secondary | ICD-10-CM

## 2016-07-11 LAB — POCT INR: INR: 2

## 2016-07-15 DIAGNOSIS — Z23 Encounter for immunization: Secondary | ICD-10-CM | POA: Diagnosis not present

## 2016-07-15 DIAGNOSIS — I482 Chronic atrial fibrillation: Secondary | ICD-10-CM | POA: Diagnosis not present

## 2016-07-15 DIAGNOSIS — E785 Hyperlipidemia, unspecified: Secondary | ICD-10-CM | POA: Diagnosis not present

## 2016-07-25 ENCOUNTER — Other Ambulatory Visit: Payer: Self-pay

## 2016-07-25 ENCOUNTER — Ambulatory Visit (INDEPENDENT_AMBULATORY_CARE_PROVIDER_SITE_OTHER): Payer: Medicare Other | Admitting: Cardiology

## 2016-07-25 ENCOUNTER — Encounter: Payer: Self-pay | Admitting: Cardiology

## 2016-07-25 VITALS — BP 165/88 | HR 84 | Ht 70.0 in | Wt 248.0 lb

## 2016-07-25 DIAGNOSIS — I10 Essential (primary) hypertension: Secondary | ICD-10-CM | POA: Diagnosis not present

## 2016-07-25 DIAGNOSIS — Z23 Encounter for immunization: Secondary | ICD-10-CM

## 2016-07-25 DIAGNOSIS — E782 Mixed hyperlipidemia: Secondary | ICD-10-CM

## 2016-07-25 DIAGNOSIS — I5022 Chronic systolic (congestive) heart failure: Secondary | ICD-10-CM | POA: Diagnosis not present

## 2016-07-25 DIAGNOSIS — I481 Persistent atrial fibrillation: Secondary | ICD-10-CM | POA: Diagnosis not present

## 2016-07-25 DIAGNOSIS — I4819 Other persistent atrial fibrillation: Secondary | ICD-10-CM

## 2016-07-25 MED ORDER — LISINOPRIL 20 MG PO TABS: 20.0000 mg | ORAL_TABLET | Freq: Two times a day (BID) | ORAL | 3 refills | Status: DC

## 2016-07-25 NOTE — Progress Notes (Signed)
.    Clinical Summary [name] is a 75 y.o.male seen today for follow up of the following medical problems.   1. Afib - recently started on amiodarone as outpatient - s/p DCCV 05/30/16. Postconversion he had some bradycardia and we lowered his coreg to 12.5mg  bid.  - recent hematuria that was transient, he remains on anticoag.  - no recent palpitations since last visit. F/u EKG shows he has returned back in to afib.     2. Hyperlipidemia - was previously on prava, developed muscle aches. Stopped taking approx December, symptoms resolved. - reports he has tried multiple other statins with Dr Joshua [name]  3. HTN - compliant with meds,but has not taken meds yet today  4. Chronic systolic HF - echo 123456 LVEF 30-35%, severe basal infeiror hypokinesis. Cannot eval diasotlic function.  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 Atrial fibrillation ([place]) 06/2010  . BPH (benign prostatic hyperplasia)   . Chronic anticoagulation   . Degenerative joint disease    Right knee  . Dysrhythmia   . Elevated cholesterol   . Hyperlipidemia   . Hypertension   . Nocturia   . Shortness of breath    on exertion  . Sleep apnea    Rx-CPAP     No Known Allergies   Current Outpatient Prescriptions  Medication Sig Dispense Refill  . amiodarone (PACERONE) 200 MG tablet Take 1 tablet (200 mg total) by mouth daily. 90 tablet 3  . amiodarone (PACERONE) 200 MG tablet Take 1 tablet (200 mg total) by mouth 2 (two) times daily. ( X 3 weeks only, then back to daily thereafter)    . carvedilol (COREG) 25 MG tablet Take 1 tablet (25 mg total) by mouth 2 (two) times daily. 180 tablet 3  . furosemide (LASIX) 40 MG tablet Take 1 tablet (40 mg total) by mouth every other day. 45 tablet 3  . lisinopril (PRINIVIL,ZESTRIL) 20 MG tablet Take 1 tablet (20 mg  total) by mouth 2 (two) times daily. 90 tablet 3  . NONFORMULARY OR COMPOUNDED ITEM 1 application at bedtime. CPAP    . Omega-3 Fatty Acids (FISH OIL) 1000 MG CAPS Take 1,000 mg by mouth 2 (two) times daily.     . potassium chloride SA (KLOR-CON M20) 20 MEQ tablet Take 1 tablet (20 mEq total) by mouth every other day. 45 tablet 3  . warfarin (COUMADIN) 10 MG tablet Take 10 mg by mouth as directed.     No current facility-administered medications for this visit.      Past Surgical History:  Procedure Laterality Date  . CARDIAC CATHETERIZATION N/A 03/04/2016   Procedure: Left Heart Cath and Coronary Angiography;  Surgeon: Troy Sine, MD;  Location: [facility];  Service: Cardiovascular;  Laterality: N/A;  . CARDIOVERSION N/A 05/30/2016   Procedure: CARDIOVERSION;  Surgeon: Arnoldo Lenis, MD;  Location: [facility];  Service: Endoscopy;  Laterality: N/A;  . COLONOSCOPY  03/10/2005  . 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 TOTAL KNEE ARTHROPLASTY Left 03/10/2014   Procedure: LEFT TOTAL KNEE ARTHROPLASTY;  Surgeon: Gearlean Alf, MD;  Location: [facility];  Service: Orthopedics;  Laterality: Left;  Marland Kitchen VENTRAL HERNIA REPAIR  03/2008, 02/2009  No Known Allergies    Family History  Problem Relation Age of Onset  . Heart attack Father      Social History [name] reports that he has been smoking Cigars.  He has never used smokeless tobacco. [name] reports that he does not drink alcohol.   Review of Systems CONSTITUTIONAL: No weight loss, fever, chills, weakness or fatigue.  HEENT: Eyes: No visual loss, blurred vision, double vision or yellow sclerae.No hearing loss, sneezing, congestion, runny nose or sore throat.  SKIN: No rash or itching.  CARDIOVASCULAR: per hpi RESPIRATORY: No shortness of breath, cough or sputum.    GASTROINTESTINAL: No anorexia, nausea, vomiting or diarrhea. No abdominal pain or blood.  GENITOURINARY: No burning on urination, no polyuria NEUROLOGICAL: No headache, dizziness, syncope, paralysis, ataxia, numbness or tingling in the extremities. No change in bowel or bladder control.  MUSCULOSKELETAL: No muscle, back pain, joint pain or stiffness.  LYMPHATICS: No enlarged nodes. No history of splenectomy.  PSYCHIATRIC: No history of depression or anxiety.  ENDOCRINOLOGIC: No reports of sweating, cold or heat intolerance. No polyuria or polydipsia.  Marland Kitchen   Physical Examination Vitals:   07/25/16 0937  BP: (!) 165/88  Pulse: 84   Vitals:   07/25/16 0937  Weight: 248 lb (112.5 kg)  Height: 5\' 10"  (1.778 m)   Manual bp: 140/75  Gen: resting comfortably, no acute distress HEENT: no scleral icterus, pupils equal round and reactive, no palptable cervical adenopathy,  CVL: irreg, no m/r/g, no jvd Resp: Clear to auscultation bilaterally GI: abdomen is soft, non-tender, non-distended, normal bowel sounds, no hepatosplenomegaly MSK: extremities are warm, no edema.  Skin: warm, no rash Neuro:  no focal deficits Psych: appropriate affect   Diagnostic Studies Echo 09/2010: LVEF 55%, mild LVH, mild MR, mod LAE.   02/2016 Echo Study Conclusions  - Left ventricle: The cavity size was moderately dilated. Wall  thickness was at the upper limits of normal. Systolic function  was moderately to severely reduced. The estimated ejection  fraction was in the range of 30% to 35%. Diffuse hypokinesis.  There is severe hypokinesis of the basalinferior myocardium. The  study is not technically sufficient to allow evaluation of LV  diastolic function. - Mitral valve: Mildly thickened leaflets . There was mild  regurgitation. - Left atrium: The atrium was severely dilated. - Right ventricle: The cavity size was mildly dilated. Systolic  function was mildly reduced. - Right atrium:  The atrium was severely dilated. - Atrial septum: No defect or patent foramen ovale was identified. - Tricuspid valve: There was trivial regurgitation. Peak RV-RA  gradient (S): 26 mm Hg. - Pericardium, extracardiac: There was no pericardial effusion.  Impressions:  - Upper normal LV wall thickness with moderate chamber dilatation  and LVEF approximately 30-35% in the setting of atrial  fibrillation. LVEF decreased compared to previous study in 2011.  Diffuse hypokinesis, most prominent in the inferior wall.  Indeterminate diastolic function. Severe biatrial enlargement.  Mildly thickened mitral leaflets with mild mitral regurgitation.  Mildly dilated RV with reduced contraction. Trivial tricuspid  regurgitation, RV-RA gradient 26 mmHg. Unable to assess CVP.  02/2016 Cath  Prox LAD lesion, 10% stenosed.  Mid LAD lesion, 20% stenosed.  Ost 3rd Diag lesion, 40% stenosed.  Prox RCA lesion, 20% stenosed.   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 Left ventriculography was  not done.   RECOMMENDATION: Medical therapy for his cardiomyopathy with improved rate control of his atrial fibrillation.      Assessment and Plan   1. Afib - difficult to control. Started on amio with recent DCCV, ekg in clinic today shows he is back in afib - he has failed an extended course of amiodarone, we will d/c this medication and focus on rate control - EKG in clinic today shows afib with good rates, continue current meds. If tachy at future visits can consider adding digoxin, with low LVEF would avoid CCBs. Given his BMI would also be reasonable to consider coreg 37.5mg  bid if needed.     2. Hyperlipidemia - intolerant to statins continue dietary modifcation  3. HTN - manual bp at goal, continue  current meds  4. Chronic systolic HF - appears euvolemic, continue current meds.     Arnoldo Lenis, M.D.

## 2016-07-25 NOTE — Patient Instructions (Signed)
Your physician recommends that you schedule a follow-up appointment in: [place] DR[address] [place]  Your physician has recommended you make the following change in your medication:   STOP AMIODARONE   Thank you for choosing [facility] [facility]!!

## 2016-07-26 ENCOUNTER — Other Ambulatory Visit: Payer: Self-pay | Admitting: *Deleted

## 2016-08-01 ENCOUNTER — Ambulatory Visit (INDEPENDENT_AMBULATORY_CARE_PROVIDER_SITE_OTHER): Payer: Medicare Other | Admitting: *Deleted

## 2016-08-01 DIAGNOSIS — Z5181 Encounter for therapeutic drug level monitoring: Secondary | ICD-10-CM | POA: Diagnosis not present

## 2016-08-01 DIAGNOSIS — I4891 Unspecified atrial fibrillation: Secondary | ICD-10-CM

## 2016-08-01 DIAGNOSIS — Z7901 Long term (current) use of anticoagulants: Secondary | ICD-10-CM | POA: Diagnosis not present

## 2016-08-01 LAB — POCT INR: INR: 2.5

## 2016-08-30 ENCOUNTER — Ambulatory Visit (INDEPENDENT_AMBULATORY_CARE_PROVIDER_SITE_OTHER): Payer: Medicare Other | Admitting: *Deleted

## 2016-08-30 DIAGNOSIS — Z7901 Long term (current) use of anticoagulants: Secondary | ICD-10-CM

## 2016-08-30 DIAGNOSIS — I4891 Unspecified atrial fibrillation: Secondary | ICD-10-CM | POA: Diagnosis not present

## 2016-08-30 DIAGNOSIS — Z5181 Encounter for therapeutic drug level monitoring: Secondary | ICD-10-CM | POA: Diagnosis not present

## 2016-08-30 LAB — POCT INR: INR: 2.1

## 2016-09-23 ENCOUNTER — Ambulatory Visit (INDEPENDENT_AMBULATORY_CARE_PROVIDER_SITE_OTHER): Payer: Medicare Other | Admitting: Cardiology

## 2016-09-23 ENCOUNTER — Encounter: Payer: Self-pay | Admitting: Cardiology

## 2016-09-23 VITALS — BP 128/94 | HR 108 | Ht 71.0 in | Wt 256.0 lb

## 2016-09-23 DIAGNOSIS — E782 Mixed hyperlipidemia: Secondary | ICD-10-CM

## 2016-09-23 DIAGNOSIS — I481 Persistent atrial fibrillation: Secondary | ICD-10-CM | POA: Diagnosis not present

## 2016-09-23 DIAGNOSIS — I10 Essential (primary) hypertension: Secondary | ICD-10-CM | POA: Diagnosis not present

## 2016-09-23 DIAGNOSIS — I4819 Other persistent atrial fibrillation: Secondary | ICD-10-CM

## 2016-09-23 DIAGNOSIS — I5022 Chronic systolic (congestive) heart failure: Secondary | ICD-10-CM | POA: Diagnosis not present

## 2016-09-23 MED ORDER — DIGOXIN 125 MCG PO TABS: 0.1250 mg | ORAL_TABLET | Freq: Every day | ORAL | 3 refills | Status: DC

## 2016-09-23 NOTE — Patient Instructions (Signed)
Your physician recommends that you schedule a follow-up appointment in: [place] [address] [place]   Your physician has recommended you make the following change in your medication:   START DIGOXIN 0.125 MG DAILY  You have been referred to [place]  Thank you for choosing [facility]!!

## 2016-09-23 NOTE — Progress Notes (Signed)
Clinical Summary Joshua [name] is a 75 y.o.male seen today for follow up of the following medical problems.   1. Afib - he failed extended course of amiodarone along with prior DCCV 05/30/16. Have been working toward rate control strategy - off dilt due to low LVEF. Remains on coreg. - no recent palpitations. Remains active - compliant with coumadin, no bleeding issues.   2. Hyperlipidemia - was previously on prava, developed muscle aches. Stopped taking approx December, symptoms resolved. - reports he has tried multiple other statins with Dr Willey Blade - currently under diet control.   3. HTN - compliant with meds,but has not taken meds yet today  4. Chronic systolic HF - echo 123456 LVEF 30-35%, severe basal infeiror hypokinesis. Cannot eval diasotlic function.  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     Past Medical History:  Diagnosis Date  . Atrial fibrillation ([place]) 06/2010  . BPH (benign prostatic hyperplasia)   . Chronic anticoagulation   . Degenerative joint disease    Right knee  . Dysrhythmia   . Elevated cholesterol   . Hyperlipidemia   . Hypertension   . Nocturia   . Shortness of breath    on exertion  . Sleep apnea    Rx-CPAP     No Known Allergies   Current Outpatient Prescriptions  Medication Sig Dispense Refill  . carvedilol (COREG) 25 MG tablet Take 1 tablet (25 mg total) by mouth 2 (two) times daily. 180 tablet 3  . furosemide (LASIX) 40 MG tablet Take 1 tablet (40 mg total) by mouth every other day. 45 tablet 3  . lisinopril (PRINIVIL,ZESTRIL) 20 MG tablet Take 1 tablet (20 mg total) by mouth 2 (two) times daily. 90 tablet 3  . NONFORMULARY OR COMPOUNDED ITEM 1 application at bedtime. CPAP    . Omega-3 Fatty Acids (FISH OIL) 1000 MG CAPS Take 1,000 mg by mouth 2 (two) times daily.     . potassium chloride SA (KLOR-CON M20)  20 MEQ tablet Take 1 tablet (20 mEq total) by mouth every other day. 45 tablet 3  . warfarin (COUMADIN) 10 MG tablet Take 10 mg by mouth as directed.     No current facility-administered medications for this visit.      Past Surgical History:  Procedure Laterality Date  . CARDIAC CATHETERIZATION N/A 03/04/2016   Procedure: Left Heart Cath and Coronary Angiography;  Surgeon: Troy Sine, MD;  Location: [facility];  Service: Cardiovascular;  Laterality: N/A;  . CARDIOVERSION N/A 05/30/2016   Procedure: CARDIOVERSION;  Surgeon: Arnoldo Lenis, MD;  Location: [facility];  Service: Endoscopy;  Laterality: N/A;  . COLONOSCOPY  03/10/2005  . 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 TOTAL KNEE ARTHROPLASTY Left 03/10/2014   Procedure: LEFT TOTAL KNEE ARTHROPLASTY;  Surgeon: Gearlean Alf, MD;  Location: [facility];  Service: Orthopedics;  Laterality: Left;  Marland Kitchen VENTRAL HERNIA REPAIR  03/2008, 02/2009     No Known Allergies    Family History  Problem Relation Age of Onset  . Heart attack Father      Social History Joshua [name] reports that he has been smoking Cigars.  He has never used smokeless tobacco. Joshua [name] reports that he does not drink alcohol.   Review of  Systems CONSTITUTIONAL: No weight loss, fever, chills, weakness or fatigue.  HEENT: Eyes: No visual loss, blurred vision, double vision or yellow sclerae.No hearing loss, sneezing, congestion, runny nose or sore throat.  SKIN: No rash or itching.  CARDIOVASCULAR: per hpi RESPIRATORY: No shortness of breath, cough or sputum.  GASTROINTESTINAL: No anorexia, nausea, vomiting or diarrhea. No abdominal pain or blood.  GENITOURINARY: No burning on urination, no polyuria NEUROLOGICAL: No headache, dizziness, syncope, paralysis, ataxia, numbness or tingling in the extremities. No change in  bowel or bladder control.  MUSCULOSKELETAL: No muscle, back pain, joint pain or stiffness.  LYMPHATICS: No enlarged nodes. No history of splenectomy.  PSYCHIATRIC: No history of depression or anxiety.  ENDOCRINOLOGIC: No reports of sweating, cold or heat intolerance. No polyuria or polydipsia.  Marland Kitchen   Physical Examination Vitals:   09/23/16 0848  BP: (!) 128/94  Pulse: (!) 108   Vitals:   09/23/16 0848  Weight: 256 lb (116.1 kg)  Height: 5\' 11"  (1.803 m)    Gen: resting comfortably, no acute distress HEENT: no scleral icterus, pupils equal round and reactive, no palptable cervical adenopathy,  CV: RRR, no m/r/g, no jvd Resp: Clear to auscultation bilaterally GI: abdomen is soft, non-tender, non-distended, normal bowel sounds, no hepatosplenomegaly MSK: extremities are warm, no edema.  Skin: warm, no rash Neuro:  no focal deficits Psych: appropriate affect   Diagnostic Studies Echo 09/2010: LVEF 55%, mild LVH, mild MR, mod LAE.   02/2016 Echo Study Conclusions  - Left ventricle: The cavity size was moderately dilated. Wall  thickness was at the upper limits of normal. Systolic function  was moderately to severely reduced. The estimated ejection  fraction was in the range of 30% to 35%. Diffuse hypokinesis.  There is severe hypokinesis of the basalinferior myocardium. The  study is not technically sufficient to allow evaluation of LV  diastolic function. - Mitral valve: Mildly thickened leaflets . There was mild  regurgitation. - Left atrium: The atrium was severely dilated. - Right ventricle: The cavity size was mildly dilated. Systolic  function was mildly reduced. - Right atrium: The atrium was severely dilated. - Atrial septum: No defect or patent foramen ovale was identified. - Tricuspid valve: There was trivial regurgitation. Peak RV-RA  gradient (S): 26 mm Hg. - Pericardium, extracardiac: There was no pericardial effusion.  Impressions:  - Upper  normal LV wall thickness with moderate chamber dilatation  and LVEF approximately 30-35% in the setting of atrial  fibrillation. LVEF decreased compared to previous study in 2011.  Diffuse hypokinesis, most prominent in the inferior wall.  Indeterminate diastolic function. Severe biatrial enlargement.  Mildly thickened mitral leaflets with mild mitral regurgitation.  Mildly dilated RV with reduced contraction. Trivial tricuspid  regurgitation, RV-RA gradient 26 mmHg. Unable to assess CVP.  02/2016 Cath  Prox LAD lesion, 10% stenosed.  Mid LAD lesion, 20% stenosed.  Ost 3rd Diag lesion, 40% stenosed.  Prox RCA lesion, 20% stenosed.   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 Left ventriculography was not done.   RECOMMENDATION: Medical therapy for his cardiomyopathy with improved rate control of his atrial fibrillation.    Assessment and Plan  1. Afib - difficult to control. Started on amio with recent DCCV, ekg in clinic today shows he is back in afib - he has failed an extended course of amiodarone, medication stopped.  -  EKG in clinic today shows afib with elevated rates. 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 Avoid CCB given systolic dysfunciton.    2. Hyperlipidemia - intolerant to statins He will continue dietary modifcation  3. HTN - at goal, continue current meds  4. Chronic systolic HF - appears euvolemic. Starting digoxin as described above - consider aldactone at next visit.   F/u 6 weeks       Arnoldo Lenis, M.D.

## 2016-10-10 ENCOUNTER — Ambulatory Visit (INDEPENDENT_AMBULATORY_CARE_PROVIDER_SITE_OTHER): Payer: Medicare Other | Admitting: *Deleted

## 2016-10-10 DIAGNOSIS — Z5181 Encounter for therapeutic drug level monitoring: Secondary | ICD-10-CM

## 2016-10-10 DIAGNOSIS — Z7901 Long term (current) use of anticoagulants: Secondary | ICD-10-CM

## 2016-10-10 DIAGNOSIS — I4891 Unspecified atrial fibrillation: Secondary | ICD-10-CM | POA: Diagnosis not present

## 2016-10-10 LAB — POCT INR: INR: 2

## 2016-10-14 ENCOUNTER — Encounter: Payer: Self-pay | Admitting: Internal Medicine

## 2016-10-28 ENCOUNTER — Encounter: Payer: Self-pay | Admitting: Internal Medicine

## 2016-11-04 ENCOUNTER — Ambulatory Visit: Payer: Medicare Other | Admitting: Internal Medicine

## 2016-11-04 ENCOUNTER — Encounter: Payer: Self-pay | Admitting: Cardiology

## 2016-11-04 ENCOUNTER — Ambulatory Visit (INDEPENDENT_AMBULATORY_CARE_PROVIDER_SITE_OTHER): Payer: Medicare Other | Admitting: Cardiology

## 2016-11-04 VITALS — BP 140/100 | HR 70 | Ht 71.0 in | Wt 249.0 lb

## 2016-11-04 DIAGNOSIS — I10 Essential (primary) hypertension: Secondary | ICD-10-CM

## 2016-11-04 DIAGNOSIS — I481 Persistent atrial fibrillation: Secondary | ICD-10-CM

## 2016-11-04 DIAGNOSIS — I5022 Chronic systolic (congestive) heart failure: Secondary | ICD-10-CM | POA: Diagnosis not present

## 2016-11-04 DIAGNOSIS — I4819 Other persistent atrial fibrillation: Secondary | ICD-10-CM

## 2016-11-04 DIAGNOSIS — E782 Mixed hyperlipidemia: Secondary | ICD-10-CM

## 2016-11-04 MED ORDER — EZETIMIBE 10 MG PO TABS: 10.0000 mg | ORAL_TABLET | Freq: Every day | ORAL | 3 refills | Status: DC

## 2016-11-04 MED ORDER — SPIRONOLACTONE 25 MG PO TABS: 25.0000 mg | ORAL_TABLET | Freq: Every day | ORAL | 3 refills | Status: DC

## 2016-11-04 NOTE — Patient Instructions (Signed)
Your physician recommends that you schedule a follow-up appointment in: 3 MONTHS WITH DR. Dundee  Your physician has recommended you make the following change in your medication:   START ALDACTONE 25 MG DAILY  START ZETIA 10 MG DAILY  Your physician recommends that you return for lab work in: 2 WEEKS BMP  Thank you for choosing [facility]!!

## 2016-11-04 NOTE — Progress Notes (Signed)
Clinical Summary [name] is a 76 y.o.male seen today for follow up of the following medical problems.   1. Afib - he failed extended course of amiodarone along with prior DCCV 05/30/16. Have been working toward rate control strategy - off dilt due to low LVEF. Remains on coreg. - no recent palpitations. Remains active - compliant with coumadin, no bleeding issues.   - no recent palpitations - compliant with meds. No recent bleeding troubles on coumadin - has upcoming appt with EP  2. Hyperlipidemia - was previously on prava, developed muscle aches. Stopped taking approx December, symptoms resolved. - reports he has tried multiple other statins with Dr Willey Blade - currently under diet control.  06/2016 TC 251 TG 256 HDL 35 LDL 163  3. HTN - compliant with meds  4. Chronic systolic HF - echo 123456 LVEF 30-35%, severe basal infeiror hypokinesis. Cannot eval diasotlic function.  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 Atrial fibrillation ([place]) 06/2010  . BPH (benign prostatic hyperplasia)   . Chronic anticoagulation   . Degenerative joint disease    Right knee  . Dysrhythmia   . Elevated cholesterol   . Hyperlipidemia   . Hypertension   . Nocturia   . Shortness of breath    on exertion  . Sleep apnea    Rx-CPAP     No Known Allergies   Current Outpatient Prescriptions  Medication Sig Dispense Refill  . carvedilol (COREG) 25 MG tablet Take 1 tablet (25 mg total) by mouth 2 (two) times daily. 180 tablet 3  . digoxin (LANOXIN) 0.125 MG tablet Take 1 tablet (0.125 mg total) by mouth daily. 90 tablet 3  . furosemide (LASIX) 40 MG tablet Take 1 tablet (40 mg total) by mouth every other day. 45 tablet 3  . lisinopril (PRINIVIL,ZESTRIL) 20 MG tablet Take 1 tablet (20 mg total) by mouth 2 (two) times daily. 90 tablet 3  .  Omega-3 Fatty Acids (FISH OIL) 1000 MG CAPS Take 1,000 mg by mouth 2 (two) times daily.     . potassium chloride SA (KLOR-CON M20) 20 MEQ tablet Take 1 tablet (20 mEq total) by mouth every other day. 45 tablet 3  . warfarin (COUMADIN) 10 MG tablet Take 10 mg by mouth as directed.     No current facility-administered medications for this visit.      Past Surgical History:  Procedure Laterality Date  . CARDIAC CATHETERIZATION N/A 03/04/2016   Procedure: Left Heart Cath and Coronary Angiography;  Surgeon: Troy Sine, MD;  Location: [facility];  Service: Cardiovascular;  Laterality: N/A;  . CARDIOVERSION N/A 05/30/2016   Procedure: CARDIOVERSION;  Surgeon: Arnoldo Lenis, MD;  Location: [facility];  Service: Endoscopy;  Laterality: N/A;  . COLONOSCOPY  03/10/2005  . 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 TOTAL KNEE ARTHROPLASTY Left 03/10/2014   Procedure: LEFT TOTAL KNEE ARTHROPLASTY;  Surgeon: Gearlean Alf, MD;  [facility];  Service: Orthopedics;  Laterality: Left;  Marland Kitchen VENTRAL HERNIA REPAIR  03/2008, 02/2009     No Known Allergies    Family History  Problem Relation Age of Onset  . Heart attack Father      Social History [name] reports that  he has been smoking Cigars.  He has never used smokeless tobacco. [name] reports that he does not drink alcohol.   Review of Systems CONSTITUTIONAL: No weight loss, fever, chills, weakness or fatigue.  HEENT: Eyes: No visual loss, blurred vision, double vision or yellow sclerae.No hearing loss, sneezing, congestion, runny nose or sore throat.  SKIN: No rash or itching.  CARDIOVASCULAR: per HPI RESPIRATORY: No shortness of breath, cough or sputum.  GASTROINTESTINAL: No anorexia, nausea, vomiting or diarrhea. No abdominal pain or blood.  GENITOURINARY: No burning on urination, no  polyuria NEUROLOGICAL: No headache, dizziness, syncope, paralysis, ataxia, numbness or tingling in the extremities. No change in bowel or bladder control.  MUSCULOSKELETAL: No muscle, back pain, joint pain or stiffness.  LYMPHATICS: No enlarged nodes. No history of splenectomy.  PSYCHIATRIC: No history of depression or anxiety.  ENDOCRINOLOGIC: No reports of sweating, cold or heat intolerance. No polyuria or polydipsia.  Marland Kitchen   Physical Examination Vitals:   11/04/16 0833  BP: (!) 140/100  Pulse: 70   Vitals:   11/04/16 0833  Weight: 249 lb (112.9 kg)  Height: 5\' 11"  (1.803 m)    Gen: resting comfortably, no acute distress HEENT: no scleral icterus, pupils equal round and reactive, no palptable cervical adenopathy,  CV: irreg, no m/r/g, no jvd Resp: Clear to auscultation bilaterally GI: abdomen is soft, non-tender, non-distended, normal bowel sounds, no hepatosplenomegaly MSK: extremities are warm, no edema.  Skin: warm, no rash Neuro:  no focal deficits Psych: appropriate affect   Diagnostic Studies Echo 09/2010: LVEF 55%, mild LVH, mild MR, mod LAE.   02/2016 Echo Study Conclusions  - Left ventricle: The cavity size was moderately dilated. Wall  thickness was at the upper limits of normal. Systolic function  was moderately to severely reduced. The estimated ejection  fraction was in the range of 30% to 35%. Diffuse hypokinesis.  There is severe hypokinesis of the basalinferior myocardium. The  study is not technically sufficient to allow evaluation of LV  diastolic function. - Mitral valve: Mildly thickened leaflets . There was mild  regurgitation. - Left atrium: The atrium was severely dilated. - Right ventricle: The cavity size was mildly dilated. Systolic  function was mildly reduced. - Right atrium: The atrium was severely dilated. - Atrial septum: No defect or patent foramen ovale was identified. - Tricuspid valve: There was trivial regurgitation.  Peak RV-RA  gradient (S): 26 mm Hg. - Pericardium, extracardiac: There was no pericardial effusion.  Impressions:  - Upper normal LV wall thickness with moderate chamber dilatation  and LVEF approximately 30-35% in the setting of atrial  fibrillation. LVEF decreased compared to previous study in 2011.  Diffuse hypokinesis, most prominent in the inferior wall.  Indeterminate diastolic function. Severe biatrial enlargement.  Mildly thickened mitral leaflets with mild mitral regurgitation.  Mildly dilated RV with reduced contraction. Trivial tricuspid  regurgitation, RV-RA gradient 26 mmHg. Unable to assess CVP.  02/2016 Cath  Prox LAD lesion, 10% stenosed.  Mid LAD lesion, 20% stenosed.  Ost 3rd Diag lesion, 40% stenosed.  Prox RCA lesion, 20% stenosed.   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 Left ventriculography was not done.   RECOMMENDATION: Medical therapy for his cardiomyopathy with improved rate control of his atrial fibrillation.    Assessment and Plan   1. Afib - difficult to control. - he  has failed an extended course of amiodarone and DCCV, medication stopped.  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    2. Hyperlipidemia - intolerant to statins - start zetia 10mg  daily  3. HTN - elevated bp, starting aldactone as described below.   4. Chronic systolic HF - appears euvolemic - start aldactone 25mg  daily, check BMET in 2 weeks.      Arnoldo Lenis, M.D.

## 2016-11-14 DIAGNOSIS — I480 Paroxysmal atrial fibrillation: Secondary | ICD-10-CM | POA: Diagnosis not present

## 2016-11-16 ENCOUNTER — Ambulatory Visit (INDEPENDENT_AMBULATORY_CARE_PROVIDER_SITE_OTHER): Payer: Medicare Other | Admitting: *Deleted

## 2016-11-16 DIAGNOSIS — Z5181 Encounter for therapeutic drug level monitoring: Secondary | ICD-10-CM | POA: Diagnosis not present

## 2016-11-16 DIAGNOSIS — I4891 Unspecified atrial fibrillation: Secondary | ICD-10-CM | POA: Diagnosis not present

## 2016-11-16 DIAGNOSIS — Z7901 Long term (current) use of anticoagulants: Secondary | ICD-10-CM | POA: Diagnosis not present

## 2016-11-16 LAB — POCT INR: INR: 1.6

## 2016-11-21 ENCOUNTER — Telehealth: Payer: Self-pay | Admitting: *Deleted

## 2016-11-21 NOTE — Telephone Encounter (Signed)
Pt aware - routed to pcp  

## 2016-11-21 NOTE — Telephone Encounter (Signed)
-----   Message from Arnoldo Lenis, MD sent at 11/17/2016  3:07 PM EST ----- Labs look good  Zandra Abts MD

## 2016-11-28 ENCOUNTER — Ambulatory Visit (INDEPENDENT_AMBULATORY_CARE_PROVIDER_SITE_OTHER): Payer: Medicare Other | Admitting: *Deleted

## 2016-11-28 DIAGNOSIS — I4891 Unspecified atrial fibrillation: Secondary | ICD-10-CM | POA: Diagnosis not present

## 2016-11-28 DIAGNOSIS — Z5181 Encounter for therapeutic drug level monitoring: Secondary | ICD-10-CM

## 2016-11-28 LAB — POCT INR: INR: 2

## 2016-12-20 DIAGNOSIS — H25013 Cortical age-related cataract, bilateral: Secondary | ICD-10-CM | POA: Diagnosis not present

## 2016-12-20 DIAGNOSIS — H2513 Age-related nuclear cataract, bilateral: Secondary | ICD-10-CM | POA: Diagnosis not present

## 2016-12-22 NOTE — Patient Instructions (Signed)
Your procedure is scheduled on: 01/03/2017  Report to [facility] at  84  AM.  Call this number if you have problems the morning of surgery: [phone]   Do not eat food or drink liquids :After Midnight.      Take these medicines the morning of surgery with A SIP OF WATER: Coreg, lanoxin, lisinopril.   Do not wear jewelry, make-up or nail polish.  Do not wear lotions, powders, or perfumes. You may wear deodorant.  Do not shave 48 hours prior to surgery.  Do not bring valuables to the hospital.  Contacts, dentures or bridgework may not be worn into surgery.  Leave suitcase in the car. After surgery it may be brought to your room.  For patients admitted to the hospital, checkout time is 11:00 AM the day of discharge.   Patients discharged the day of surgery will not be allowed to drive home.  :     Please read over the following fact sheets that you were given: Coughing and Deep Breathing, Surgical Site Infection Prevention, Anesthesia Post-op Instructions and Care and Recovery After Surgery    Cataract A cataract is a clouding of the lens of the eye. When a lens becomes cloudy, vision is reduced based on the degree and nature of the clouding. Many cataracts reduce vision to some degree. Some cataracts make people more near-sighted as they develop. Other cataracts increase glare. Cataracts that are ignored and become worse can sometimes look white. The white color can be seen through the pupil. CAUSES   Aging. However, cataracts may occur at any age, even in newborns.   Certain drugs.   Trauma to the eye.   Certain diseases such as diabetes.   Specific eye diseases such as chronic inflammation inside the eye or a sudden attack of a rare form of glaucoma.   Inherited or acquired medical problems.  SYMPTOMS   Gradual, progressive drop in vision in the affected eye.   Severe, rapid visual loss. This most often happens when trauma is the cause.  DIAGNOSIS  To detect a cataract, an  eye doctor examines the lens. Cataracts are best diagnosed with an exam of the eyes with the pupils enlarged (dilated) by drops.  TREATMENT  For an early cataract, vision may improve by using different eyeglasses or stronger lighting. If that does not help your vision, surgery is the only effective treatment. A cataract needs to be surgically removed when vision loss interferes with your everyday activities, such as driving, reading, or watching TV. A cataract may also have to be removed if it prevents examination or treatment of another eye problem. Surgery removes the cloudy lens and usually replaces it with a substitute lens (intraocular lens, IOL).  At a time when both you and your doctor agree, the cataract will be surgically removed. If you have cataracts in both eyes, only one is usually removed at a time. This allows the operated eye to heal and be out of danger from any possible problems after surgery (such as infection or poor wound healing). In rare cases, a cataract may be doing damage to your eye. In these cases, your caregiver may advise surgical removal right away. The vast majority of people who have cataract surgery have better vision afterward. HOME CARE INSTRUCTIONS  If you are not planning surgery, you may be asked to do the following:  Use different eyeglasses.   Use stronger or brighter lighting.   Ask your eye doctor about reducing your medicine  dose or changing medicines if it is thought that a medicine caused your cataract. Changing medicines does not make the cataract go away on its own.   Become familiar with your surroundings. Poor vision can lead to injury. Avoid bumping into things on the affected side. You are at a higher risk for tripping or falling.   Exercise extreme care when driving or operating machinery.   Wear sunglasses if you are sensitive to bright light or experiencing problems with glare.  SEEK IMMEDIATE MEDICAL CARE IF:   You have a worsening or sudden  vision loss.   You notice redness, swelling, or increasing pain in the eye.   You have a fever.  Document Released: 10/10/2005 Document Revised: 09/29/2011 Document Reviewed: 06/03/2011 [facility] Patient Information 2012 [place].PATIENT INSTRUCTIONS POST-ANESTHESIA  IMMEDIATELY FOLLOWING SURGERY:  Do not drive or operate machinery for the first twenty four hours after surgery.  Do not make any important decisions for twenty four hours after surgery or while taking narcotic pain medications or sedatives.  If you develop intractable nausea and vomiting or a severe headache please notify your doctor immediately.  FOLLOW-UP:  Please make an appointment with your surgeon as instructed. You do not need to follow up with anesthesia unless specifically instructed to do so.  WOUND CARE INSTRUCTIONS (if applicable):  Keep a dry clean dressing on the anesthesia/puncture wound site if there is drainage.  Once the wound has quit draining you may leave it open to air.  Generally you should leave the bandage intact for twenty four hours unless there is drainage.  If the epidural site drains for more than 36-48 hours please call the anesthesia department.  QUESTIONS?:  Please feel free to call your physician or the hospital operator if you have any questions, and they will be happy to assist you.

## 2016-12-26 ENCOUNTER — Encounter (HOSPITAL_COMMUNITY)
Admission: RE | Admit: 2016-12-26 | Discharge: 2016-12-26 | Disposition: A | Discharge status: Home / Self Care | Payer: Medicare Other | Source: Ambulatory Visit | Attending: Ophthalmology | Admitting: Ophthalmology

## 2016-12-26 ENCOUNTER — Encounter (HOSPITAL_COMMUNITY): Payer: Self-pay

## 2016-12-26 ENCOUNTER — Ambulatory Visit (INDEPENDENT_AMBULATORY_CARE_PROVIDER_SITE_OTHER): Payer: Medicare Other | Admitting: *Deleted

## 2016-12-26 DIAGNOSIS — H2512 Age-related nuclear cataract, left eye: Secondary | ICD-10-CM | POA: Insufficient documentation

## 2016-12-26 DIAGNOSIS — Z01812 Encounter for preprocedural laboratory examination: Secondary | ICD-10-CM | POA: Insufficient documentation

## 2016-12-26 DIAGNOSIS — I4891 Unspecified atrial fibrillation: Secondary | ICD-10-CM

## 2016-12-26 DIAGNOSIS — Z5181 Encounter for therapeutic drug level monitoring: Secondary | ICD-10-CM | POA: Diagnosis not present

## 2016-12-26 LAB — CBC WITH DIFFERENTIAL/PLATELET
BASOS ABS: 0 10*3/uL (ref 0.0–0.1)
Basophils Relative: 1 %
EOS ABS: 0.1 10*3/uL (ref 0.0–0.7)
EOS PCT: 2 %
HCT: 41.9 % (ref 39.0–52.0)
HEMOGLOBIN: 14.2 g/dL (ref 13.0–17.0)
LYMPHS ABS: 1.7 10*3/uL (ref 0.7–4.0)
LYMPHS PCT: 29 %
MCH: 32.3 pg (ref 26.0–34.0)
MCHC: 33.9 g/dL (ref 30.0–36.0)
MCV: 95.2 fL (ref 78.0–100.0)
Monocytes Absolute: 0.4 10*3/uL (ref 0.1–1.0)
Monocytes Relative: 6 %
NEUTROS PCT: 63 %
Neutro Abs: 3.7 10*3/uL (ref 1.7–7.7)
PLATELETS: 112 10*3/uL — AB (ref 150–400)
RBC: 4.4 MIL/uL (ref 4.22–5.81)
RDW: 13.4 % (ref 11.5–15.5)
WBC: 5.9 10*3/uL (ref 4.0–10.5)

## 2016-12-26 LAB — POCT INR: INR: 2.3

## 2016-12-26 LAB — BASIC METABOLIC PANEL
ANION GAP: 6 (ref 5–15)
BUN: 29 mg/dL — ABNORMAL HIGH (ref 6–20)
CHLORIDE: 106 mmol/L (ref 101–111)
CO2: 23 mmol/L (ref 22–32)
Calcium: 9 mg/dL (ref 8.9–10.3)
Creatinine, Ser: 1.2 mg/dL (ref 0.61–1.24)
GFR calc Af Amer: >60 mL/min (ref >60)
GFR, EST NON AFRICAN AMERICAN: 58 mL/min — AB (ref >60)
Glucose, Bld: 142 mg/dL — ABNORMAL HIGH (ref 65–99)
POTASSIUM: 4.1 mmol/L (ref 3.5–5.1)
SODIUM: 135 mmol/L (ref 135–145)

## 2016-12-27 ENCOUNTER — Other Ambulatory Visit (HOSPITAL_COMMUNITY): Payer: Medicare Other

## 2016-12-28 ENCOUNTER — Other Ambulatory Visit (HOSPITAL_COMMUNITY): Payer: Medicare Other

## 2016-12-28 DIAGNOSIS — H2511 Age-related nuclear cataract, right eye: Secondary | ICD-10-CM | POA: Diagnosis not present

## 2016-12-28 DIAGNOSIS — H2512 Age-related nuclear cataract, left eye: Secondary | ICD-10-CM | POA: Diagnosis not present

## 2017-01-03 ENCOUNTER — Ambulatory Visit (HOSPITAL_COMMUNITY): Payer: Medicare Other | Admitting: Anesthesiology

## 2017-01-03 ENCOUNTER — Ambulatory Visit (HOSPITAL_COMMUNITY)
Admission: RE | Admit: 2017-01-03 | Discharge: 2017-01-03 | Disposition: A | Discharge status: Home / Self Care | Payer: Medicare Other | Source: Ambulatory Visit | Attending: Ophthalmology | Admitting: Ophthalmology

## 2017-01-03 ENCOUNTER — Encounter (HOSPITAL_COMMUNITY)
Admission: RE | Disposition: A | Discharge status: Home / Self Care | Payer: Self-pay | Source: Ambulatory Visit | Attending: Ophthalmology

## 2017-01-03 DIAGNOSIS — I509 Heart failure, unspecified: Secondary | ICD-10-CM | POA: Diagnosis not present

## 2017-01-03 DIAGNOSIS — F17200 Nicotine dependence, unspecified, uncomplicated: Secondary | ICD-10-CM | POA: Diagnosis not present

## 2017-01-03 DIAGNOSIS — I4891 Unspecified atrial fibrillation: Secondary | ICD-10-CM | POA: Insufficient documentation

## 2017-01-03 DIAGNOSIS — H2512 Age-related nuclear cataract, left eye: Secondary | ICD-10-CM | POA: Insufficient documentation

## 2017-01-03 DIAGNOSIS — G4730 Sleep apnea, unspecified: Secondary | ICD-10-CM | POA: Diagnosis not present

## 2017-01-03 DIAGNOSIS — I2510 Atherosclerotic heart disease of native coronary artery without angina pectoris: Secondary | ICD-10-CM | POA: Insufficient documentation

## 2017-01-03 DIAGNOSIS — I110 Hypertensive heart disease with heart failure: Secondary | ICD-10-CM | POA: Diagnosis not present

## 2017-01-03 DIAGNOSIS — H269 Unspecified cataract: Secondary | ICD-10-CM | POA: Diagnosis not present

## 2017-01-03 HISTORY — PX: CATARACT EXTRACTION W/PHACO: SHX586

## 2017-01-03 SURGERY — PHACOEMULSIFICATION, CATARACT, WITH IOL INSERTION
Anesthesia: Monitor Anesthesia Care | Site: Eye | Laterality: Left

## 2017-01-03 MED ORDER — LACTATED RINGERS IV SOLN
INTRAVENOUS | Status: DC
  Administered 2017-01-03: 07:00:00 via INTRAVENOUS

## 2017-01-03 MED ORDER — LIDOCAINE HCL (PF) 1 % IJ SOLN
INTRAMUSCULAR | Status: AC
  Filled 2017-01-03: qty 2

## 2017-01-03 MED ORDER — FENTANYL CITRATE (PF) 100 MCG/2ML IJ SOLN
25.0000 ug | INTRAMUSCULAR | Status: AC
  Administered 2017-01-03: 25 ug via INTRAVENOUS

## 2017-01-03 MED ORDER — MIDAZOLAM HCL 2 MG/2ML IJ SOLN
1.0000 mg | INTRAMUSCULAR | Status: AC
  Administered 2017-01-03: 2 mg via INTRAVENOUS

## 2017-01-03 MED ORDER — TETRACAINE HCL 0.5 % OP SOLN
1.0000 [drp] | OPHTHALMIC | Status: AC
  Administered 2017-01-03 (×3): 1 [drp] via OPHTHALMIC

## 2017-01-03 MED ORDER — PROVISC 10 MG/ML IO SOLN
INTRAOCULAR | Status: DC | PRN
  Administered 2017-01-03: 0.85 mL via INTRAOCULAR

## 2017-01-03 MED ORDER — TETRACAINE 0.5 % OP SOLN OPTIME - NO CHARGE
OPHTHALMIC | Status: DC | PRN
  Administered 2017-01-03: 2 [drp] via OPHTHALMIC

## 2017-01-03 MED ORDER — FENTANYL CITRATE (PF) 100 MCG/2ML IJ SOLN
INTRAMUSCULAR | Status: AC
  Filled 2017-01-03: qty 2

## 2017-01-03 MED ORDER — PHENYLEPHRINE HCL 2.5 % OP SOLN
1.0000 [drp] | OPHTHALMIC | Status: AC
  Administered 2017-01-03 (×3): 1 [drp] via OPHTHALMIC

## 2017-01-03 MED ORDER — BSS IO SOLN
INTRAOCULAR | Status: DC | PRN
  Administered 2017-01-03: 15 mL

## 2017-01-03 MED ORDER — CYCLOPENTOLATE-PHENYLEPHRINE 0.2-1 % OP SOLN
1.0000 [drp] | OPHTHALMIC | Status: AC
  Administered 2017-01-03 (×3): 1 [drp] via OPHTHALMIC

## 2017-01-03 MED ORDER — EPINEPHRINE PF 1 MG/ML IJ SOLN
INTRAOCULAR | Status: DC | PRN
  Administered 2017-01-03: 500 mL

## 2017-01-03 MED ORDER — EPINEPHRINE PF 1 MG/ML IJ SOLN
INTRAMUSCULAR | Status: AC
  Filled 2017-01-03: qty 1

## 2017-01-03 MED ORDER — KETOROLAC TROMETHAMINE 0.5 % OP SOLN
1.0000 [drp] | OPHTHALMIC | Status: AC
  Administered 2017-01-03 (×3): 1 [drp] via OPHTHALMIC

## 2017-01-03 MED ORDER — MIDAZOLAM HCL 2 MG/2ML IJ SOLN
INTRAMUSCULAR | Status: AC
  Filled 2017-01-03: qty 2

## 2017-01-03 SURGICAL SUPPLY — 11 items
CLOTH BEACON ORANGE TIMEOUT ST (SAFETY) ×2 IMPLANT
EYE SHIELD UNIVERSAL CLEAR (GAUZE/BANDAGES/DRESSINGS) ×3 IMPLANT
GLOVE BIOGEL PI IND STRL 6.5 (GLOVE) IMPLANT
GLOVE BIOGEL PI IND STRL 7.0 (GLOVE) ×1 IMPLANT
GLOVE BIOGEL PI INDICATOR 6.5 (GLOVE) ×2
GLOVE BIOGEL PI INDICATOR 7.0 (GLOVE) ×2
LENS ALC ACRYL/TECN (Ophthalmic Related) ×3 IMPLANT
PAD ARMBOARD 7.5X6 YLW CONV (MISCELLANEOUS) ×3 IMPLANT
TAPE SURG TRANSPORE 1 IN (GAUZE/BANDAGES/DRESSINGS) IMPLANT
TAPE SURGICAL TRANSPORE 1 IN (GAUZE/BANDAGES/DRESSINGS) ×2
WATER STERILE IRR 250ML POUR (IV SOLUTION) ×3 IMPLANT

## 2017-01-03 NOTE — Anesthesia Preprocedure Evaluation (Signed)
Anesthesia Evaluation  Patient identified by MRN, date of birth, ID band Patient awake    Reviewed: Allergy & Precautions, H&P , NPO status , Patient's Chart, lab work & pertinent test results  History of Anesthesia Complications (+) POST - OP SPINAL HEADACHE  Airway Mallampati: II  TM Distance: >3 FB Neck ROM: Full    Dental no notable dental hx. (+) Caps   Pulmonary shortness of breath and with exertion, sleep apnea and Continuous Positive Airway Pressure Ventilation , Current Smoker,    Pulmonary exam normal breath sounds clear to auscultation       Cardiovascular hypertension, Pt. on medications + CAD and +CHF  Normal cardiovascular exam+ dysrhythmias Atrial Fibrillation  Rhythm:Regular Rate:Normal     Neuro/Psych negative neurological ROS  negative psych ROS   GI/Hepatic negative GI ROS, Neg liver ROS,   Endo/Other  negative endocrine ROS  Renal/GU negative Renal ROS  negative genitourinary   Musculoskeletal negative musculoskeletal ROS (+)   Abdominal   Peds negative pediatric ROS (+)  Hematology negative hematology ROS (+)   Anesthesia Other Findings Upper front R cap  Reproductive/Obstetrics negative OB ROS                             Anesthesia Physical Anesthesia Plan  ASA: III  Anesthesia Plan: MAC   Post-op Pain Management:    Induction: Intravenous  Airway Management Planned: Nasal Cannula  Additional Equipment:   Intra-op Plan:   Post-operative Plan:   Informed Consent: I have reviewed the patients History and Physical, chart, labs and discussed the procedure including the risks, benefits and alternatives for the proposed anesthesia with the patient or authorized representative who has indicated his/her understanding and acceptance.     Plan Discussed with: CRNA  Anesthesia Plan Comments:         Anesthesia Quick Evaluation

## 2017-01-03 NOTE — Transfer of Care (Signed)
Immediate Anesthesia Transfer of Care Note  Patient: Joshua [name]  Procedure(s) Performed: Procedure(s) with comments: CATARACT EXTRACTION PHACO AND INTRAOCULAR LENS PLACEMENT (IOC) (Left) - CDE: 5.74  Patient Location: PACU  Anesthesia Type:MAC  Level of Consciousness: awake, alert  and patient cooperative  Airway & Oxygen Therapy: Patient Spontanous Breathing  Post-op Assessment: Report given to RN  Post vital signs: Reviewed and stable  Last Vitals:  Vitals:   01/03/17 0710 01/03/17 0715  BP: (!) 125/92 114/74  Pulse:    Resp: 13 (!) 0  Temp:      Last Pain:  Vitals:   01/03/17 0643  TempSrc: Oral         Complications: No apparent anesthesia complications

## 2017-01-03 NOTE — Discharge Instructions (Signed)
°  °          [facility] Instructions °[address]- [place] [address]-[place] °    ° °1. Avoid closing eyes tightly. One often closes the eye tightly when laughing, talking, sneezing, coughing or if they feel irritated. At these times, you should be careful not to close your eyes tightly. ° °2. Instill eye drops as instructed. To instill drops in your eye, open it, look up and have someone gently pull the lower lid down and instill a couple of drops inside the lower lid. ° °3. Do not touch upper lid. ° °4. Take Advil or Tylenol for pain. ° °5. You may use either eye for near work, such as reading or sewing and you may watch television. ° °6. You may have your hair done at the beauty parlor at any time. ° °7. Wear dark glasses with or without your own glasses if you are in bright light. ° °8. Call our office at [phone] or [phone] if you have sharp pain in your eye or unusual symptoms. ° °9.  FOLLOW UP WITH DR. SHAPIRO TODAY IN HIS [place] OFFICE AT 2:45pm. ° °  °I have received a copy of the above instructions and will follow them.  ° ° ° °IF YOU ARE IN IMMEDIATE DANGER CALL 911! ° °It is important for you to keep your follow-up appointment with your physician after discharge, OR, for you /your caregiver to make a follow-up appointment with your physician / medical provider after discharge. ° °Show these instructions to the next healthcare provider you see. °PATIENT INSTRUCTIONS °POST-ANESTHESIA ° °IMMEDIATELY FOLLOWING SURGERY:  Do not drive or operate machinery for the first twenty four hours after surgery.  Do not make any important decisions for twenty four hours after surgery or while taking narcotic pain medications or sedatives.  If you develop intractable nausea and vomiting or a severe headache please notify your doctor immediately. ° °FOLLOW-UP:  Please make an appointment with your surgeon as instructed. You do not need to follow up with anesthesia unless  specifically instructed to do so. ° °WOUND CARE INSTRUCTIONS (if applicable):  Keep a dry clean dressing on the anesthesia/puncture wound site if there is drainage.  Once the wound has quit draining you may leave it open to air.  Generally you should leave the bandage intact for twenty four hours unless there is drainage.  If the epidural site drains for more than 36-48 hours please call the anesthesia department. ° °QUESTIONS?:  Please feel free to call your physician or the hospital operator if you have any questions, and they will be happy to assist you.    ° ° ° °

## 2017-01-03 NOTE — Anesthesia Postprocedure Evaluation (Signed)
Anesthesia Post Note  Patient: Joshua [name]  Procedure(s) Performed: Procedure(s) (LRB): CATARACT EXTRACTION PHACO AND INTRAOCULAR LENS PLACEMENT (IOC) (Left)  Patient location during evaluation: PACU Anesthesia Type: MAC Level of consciousness: awake and alert, oriented and patient cooperative Pain management: pain level controlled Vital Signs Assessment: post-procedure vital signs reviewed and stable Respiratory status: spontaneous breathing, nonlabored ventilation and respiratory function stable Cardiovascular status: blood pressure returned to baseline Postop Assessment: no signs of nausea or vomiting Anesthetic complications: no     Last Vitals:  Vitals:   01/03/17 0710 01/03/17 0715  BP: (!) 125/92 114/74  Pulse:    Resp: 13 (!) 0  Temp:      Last Pain:  Vitals:   01/03/17 0643  TempSrc: Oral                 Violett Hobbs J

## 2017-01-03 NOTE — Op Note (Signed)
Patient brought to the operating room and prepped and draped in the usual manner.  Lid speculum inserted in left eye.  Stab incision made at the twelve o'clock position.  Provisc instilled in the anterior chamber.   A 2.4 mm. Stab incision was made temporally.  An anterior capsulotomy was done with a bent 25 gauge needle.  The nucleus was hydrodissected.  The Phaco tip was inserted in the anterior chamber and the nucleus was emulsified.  CDE was 5.74.  The cortical material was then removed with the I and A tip.  Posterior capsule was the polished.  The anterior chamber was deepened with Provisc.  A 20.5 Diopter Alcon [id number] IOL was then inserted in the capsular bag.  Provisc was then removed with the I and A tip.  The wound was then hydrated.  Patient sent to the Recovery Room in good condition with follow up in my office.  Preoperative Diagnosis:  Nuclear Cataract OS Postoperative Diagnosis:  Same Procedure name: Kelman Phacoemulsification OS with IOL

## 2017-01-03 NOTE — H&P (Signed)
The patient was re examined and there is no change in the patients condition since the original H and P. 

## 2017-01-04 ENCOUNTER — Encounter (HOSPITAL_COMMUNITY): Payer: Self-pay | Admitting: Ophthalmology

## 2017-01-13 DIAGNOSIS — I5022 Chronic systolic (congestive) heart failure: Secondary | ICD-10-CM | POA: Diagnosis not present

## 2017-01-13 DIAGNOSIS — I10 Essential (primary) hypertension: Secondary | ICD-10-CM | POA: Diagnosis not present

## 2017-01-13 DIAGNOSIS — Z6838 Body mass index (BMI) 38.0-38.9, adult: Secondary | ICD-10-CM | POA: Diagnosis not present

## 2017-01-23 ENCOUNTER — Ambulatory Visit (INDEPENDENT_AMBULATORY_CARE_PROVIDER_SITE_OTHER): Payer: Medicare Other | Admitting: *Deleted

## 2017-01-23 DIAGNOSIS — Z5181 Encounter for therapeutic drug level monitoring: Secondary | ICD-10-CM | POA: Diagnosis not present

## 2017-01-23 DIAGNOSIS — I4891 Unspecified atrial fibrillation: Secondary | ICD-10-CM

## 2017-01-23 LAB — POCT INR: INR: 2

## 2017-01-26 ENCOUNTER — Encounter: Payer: Self-pay | Admitting: Internal Medicine

## 2017-02-09 IMAGING — CT CT ABD-PEL WO/W CM
2 of 9 series · 11 of 46 positions shown, 17 images · IV contrast (omnipaque)
Comparison: None.

CLINICAL DATA: Gross hematuria began on November 07, 2014.

EXAM:
CT ABDOMEN AND PELVIS WITHOUT AND WITH CONTRAST
TECHNIQUE: Multidetector CT imaging of the abdomen and pelvis was performed
following the standard protocol before and following the bolus
administration of intravenous contrast.
CONTRAST:  125mL OMNIPAQUE IOHEXOL 300 MG/ML  SOLN

[Series 4: hematuria mpr coro pre (id) · coronal · non-contrast · 0.78mm/px · 2 of 112 slices shown]
[im 38/112  soft-tissue]
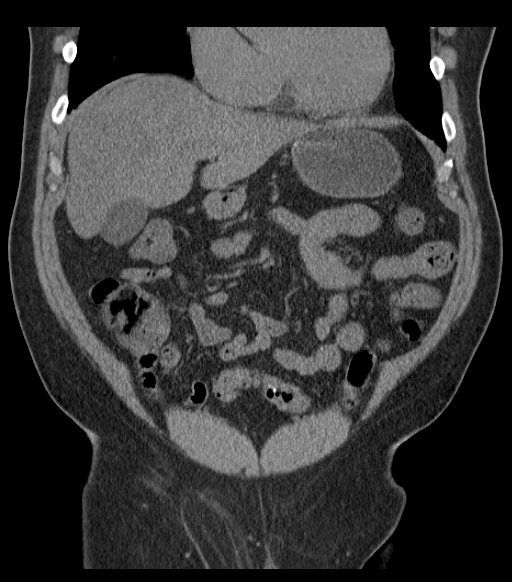
[im 75/112  soft-tissue]
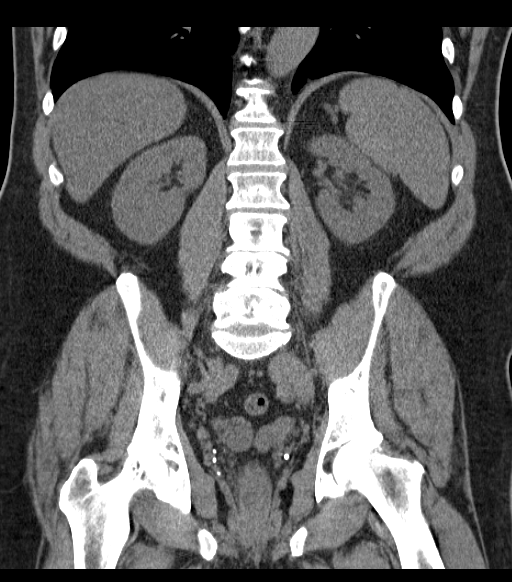

[Series 12: hematuria delay (id) · axial · delayed · 0.82mm/px · z∈[-190,+180]mm · 9 of 94 slices shown, 15 images]
[im 10/94  soft-tissue]
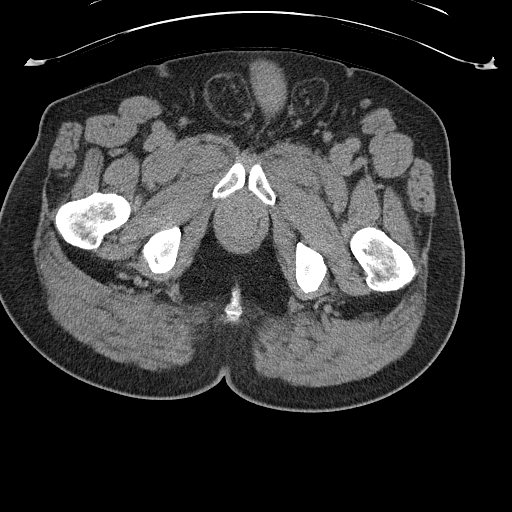
[im 10/94  bone]
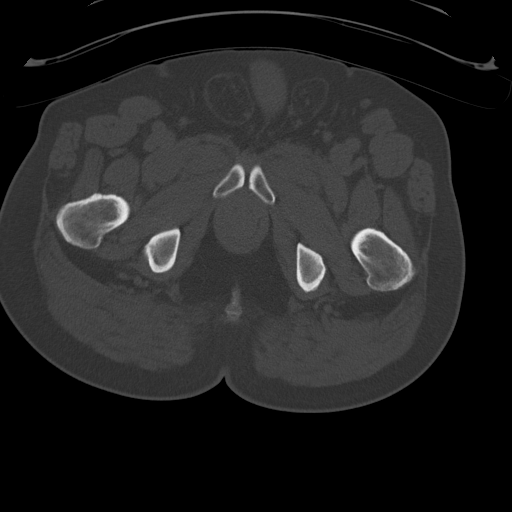
[im 19/94  soft-tissue]
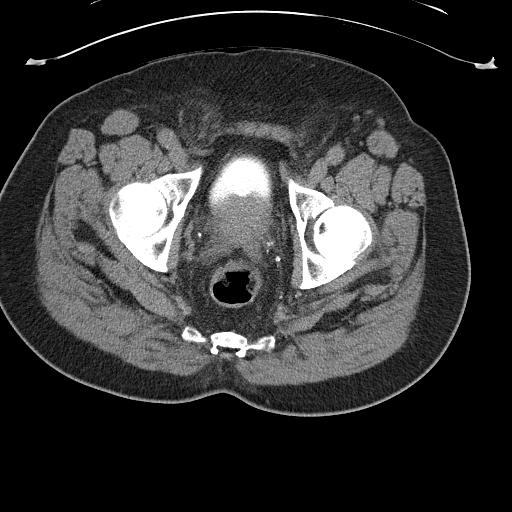
[im 28/94  soft-tissue]
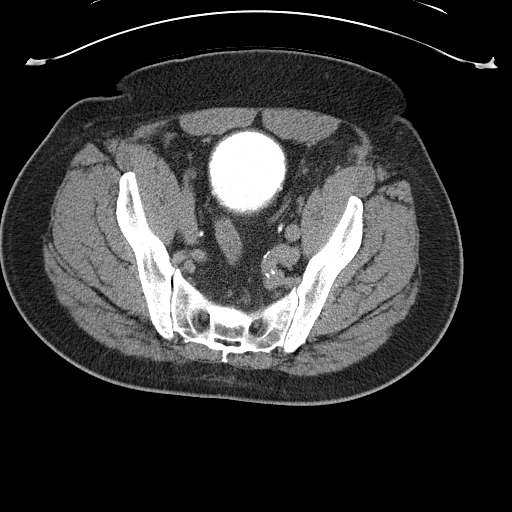
[im 38/94  soft-tissue]
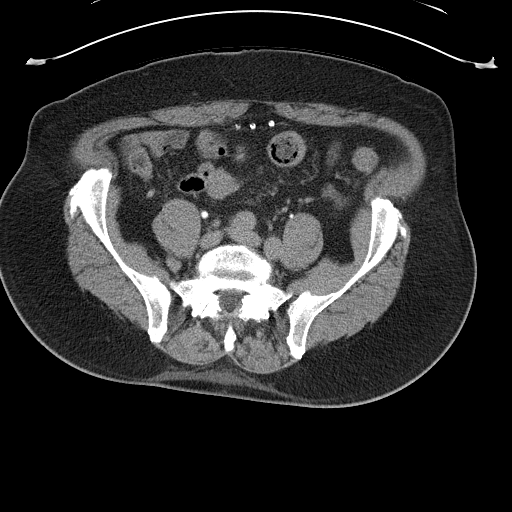
[im 47/94  soft-tissue]
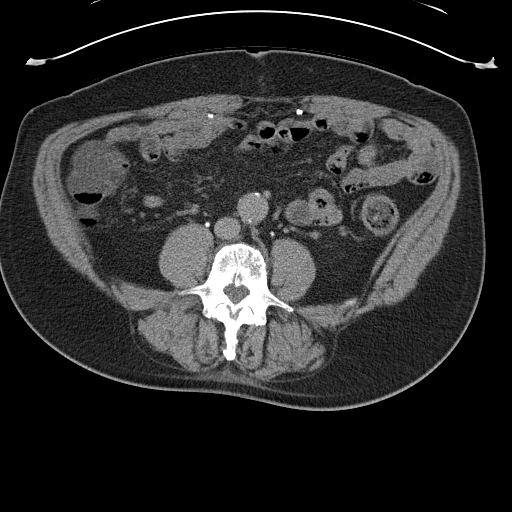
[im 56/94  soft-tissue]
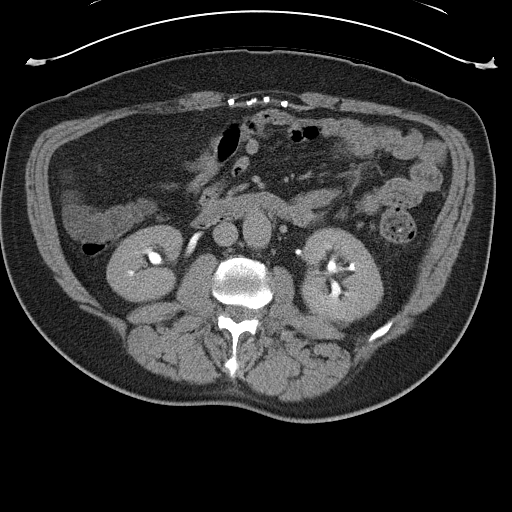
[im 56/94  lung]
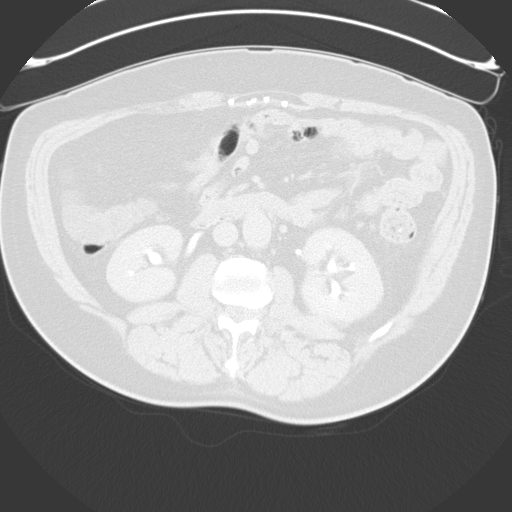
[im 66/94  soft-tissue]
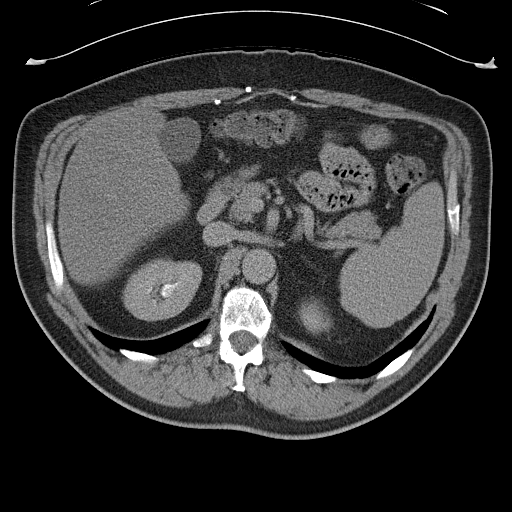
[im 66/94  lung]
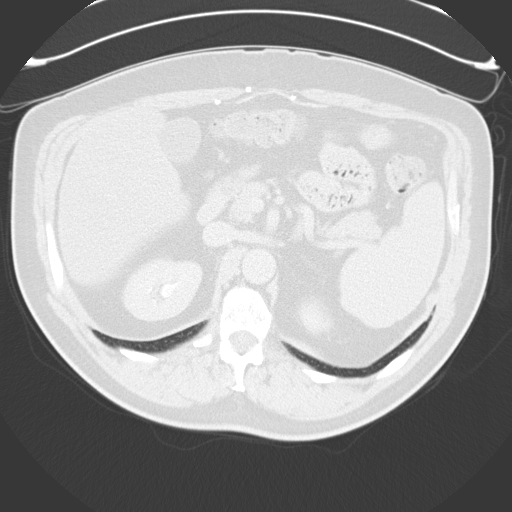
[im 75/94  soft-tissue]
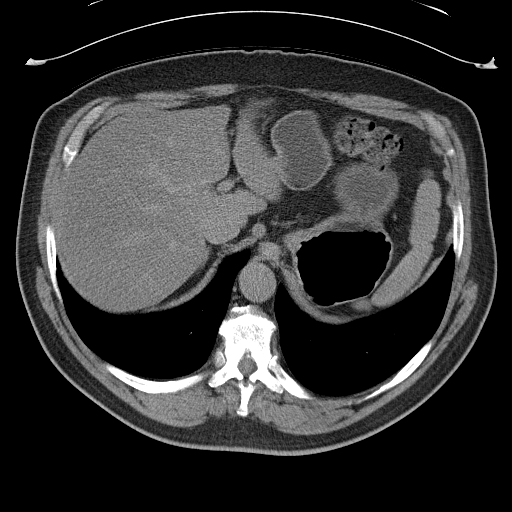
[im 75/94  lung]
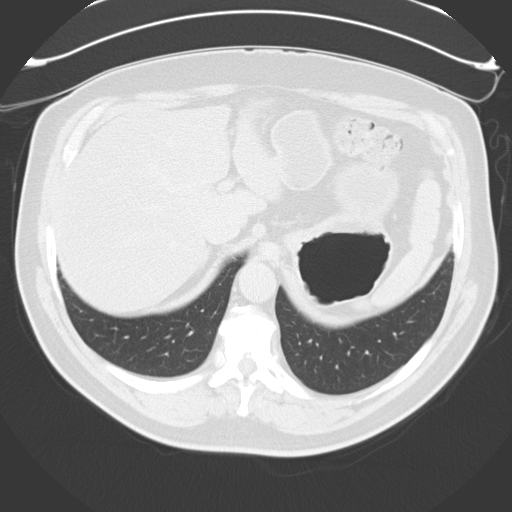
[im 84/94  soft-tissue]
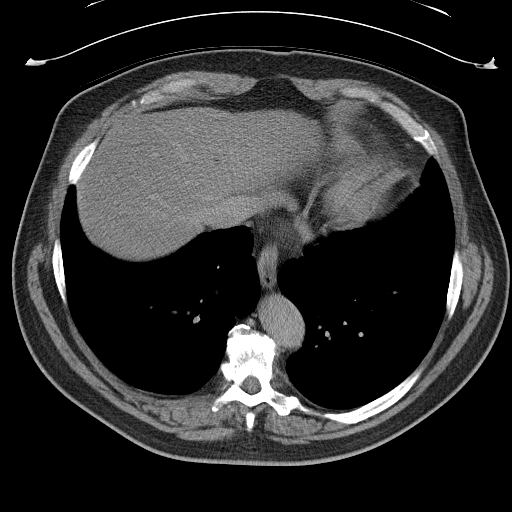
[im 84/94  lung]
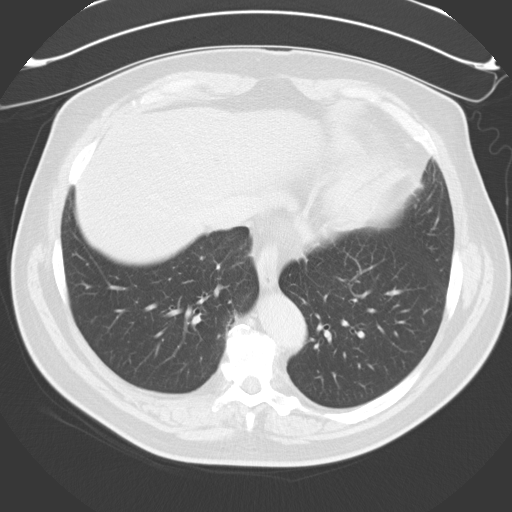
[im 84/94  bone]
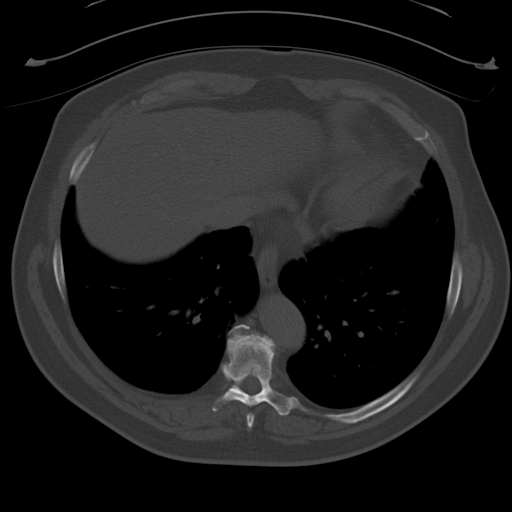

[11 of 46 positions shown; findings below may reference images not displayed]

FINDINGS: Lower chest: The lung bases are clear of acute process. No pleural
effusion or pulmonary lesions. The heart is normal in size. No
pericardial effusion. Coronary artery calcifications are noted. The
distal esophagus and aorta are unremarkable.

Hepatobiliary: Diffuse fatty infiltration of the liver but no focal
hepatic lesions or intrahepatic biliary dilatation. The gallbladder
is normal. No common bile duct dilatation.

Pancreas: Normal

Spleen: Normal

Adrenals/Urinary Tract: The adrenal glands are unremarkable. No
renal or obstructing ureteral calculi or bladder calculi. After
contrast administration there is symmetric renal enhancement. No
worrisome renal lesions. Minimal scarring type changes involving the
right renal parenchyma. The delayed images do not demonstrate and
significant collecting system abnormality. Both ureters are normal.
The bladder demonstrates cellules and trabeculation with a small
right-sided bladder diverticulum. No mass is identified. Mild
prostate gland enlargement with median lobe hypertrophy impressing
on the base of the bladder. The seminal vesicles appear normal.

Stomach/Bowel: The stomach, duodenum, small bowel and colon are
unremarkable without oral contrast. No inflammatory changes, mass
lesions or obstructive findings. Scattered colonic diverticulosis
without findings for acute diverticulitis. The terminal ileum is
normal. The appendix is normal.

Vascular/Lymphatic: Scattered atherosclerotic calcifications
involving the aorta but no focal aneurysm or dissection. The branch
vessels are patent. The major venous structures are patent.

Other: Surgical changes from a anterior abdominal wall hernia repair
with mesh. No recurrent hernia.

Pelvis: No pelvic mass or free pelvic fluid collections. There are
small scattered stable pelvic lymph nodes. The external iliac nodes
are unchanged. There are small stable inguinal lymph nodes.
Bilateral inguinal hernias containing fat are stable.

Musculoskeletal: No significant bony findings. No destructive bone
lesions or fracture.
IMPRESSION: 1. No CT findings to account for the patient's hematuria. No renal,
ureteral or bladder calculi or mass.
2. The bladder demonstrates cellules and trabeculation with mild
wall thickening but no mass.
3. Mild prostate gland enlargement and median lobe hypertrophy.
4. Stable pelvic lymph nodes.
5. No acute abdominal/pelvic findings, mass lesions or adenopathy.
6. Diffuse fatty infiltration of the liver.

## 2017-02-13 ENCOUNTER — Encounter: Payer: Self-pay | Admitting: Internal Medicine

## 2017-02-13 ENCOUNTER — Ambulatory Visit (INDEPENDENT_AMBULATORY_CARE_PROVIDER_SITE_OTHER): Payer: Medicare Other | Admitting: Internal Medicine

## 2017-02-13 VITALS — BP 136/74 | HR 80 | Ht 71.0 in | Wt 245.0 lb

## 2017-02-13 DIAGNOSIS — I4819 Other persistent atrial fibrillation: Secondary | ICD-10-CM

## 2017-02-13 DIAGNOSIS — I480 Paroxysmal atrial fibrillation: Secondary | ICD-10-CM

## 2017-02-13 DIAGNOSIS — I5022 Chronic systolic (congestive) heart failure: Secondary | ICD-10-CM

## 2017-02-13 DIAGNOSIS — D1800 Hemangioma unspecified site: Secondary | ICD-10-CM | POA: Diagnosis not present

## 2017-02-13 DIAGNOSIS — G4733 Obstructive sleep apnea (adult) (pediatric): Secondary | ICD-10-CM | POA: Diagnosis not present

## 2017-02-13 DIAGNOSIS — D225 Melanocytic nevi of trunk: Secondary | ICD-10-CM | POA: Diagnosis not present

## 2017-02-13 DIAGNOSIS — I481 Persistent atrial fibrillation: Secondary | ICD-10-CM

## 2017-02-13 DIAGNOSIS — Z85820 Personal history of malignant melanoma of skin: Secondary | ICD-10-CM | POA: Diagnosis not present

## 2017-02-13 DIAGNOSIS — L814 Other melanin hyperpigmentation: Secondary | ICD-10-CM | POA: Diagnosis not present

## 2017-02-13 DIAGNOSIS — L821 Other seborrheic keratosis: Secondary | ICD-10-CM | POA: Diagnosis not present

## 2017-02-13 NOTE — Patient Instructions (Addendum)
Medication Instructions:  Your physician recommends that you continue on your current medications as directed. Please refer to the Current Medication list given to you today.   Labwork: None ordered   Testing/Procedures: Your physician has requested that you have an echocardiogram. Echocardiography is a painless test that uses sound waves to create images of your heart. It provides your doctor with information about the size and shape of your heart and how well your heart's chambers and valves are working. This procedure takes approximately one hour. There are no restrictions for this procedure.--in [place]  We will call you with your results    Follow-Up: Your physician recommends that you schedule a follow-up appointment as needed with Dr Rayann Heman   Any Other Special Instructions Will Be Listed Below (If Applicable).     If you need a refill on your cardiac medications before your next appointment, please call your pharmacy.

## 2017-02-13 NOTE — Progress Notes (Signed)
Electrophysiology Office Note   Date:  02/13/2017   ID:  Joshua [name], DOB 09-Mar-1941, MRN [id number]  PCP:  Asencion Noble, MD  Cardiologist:  Dr Harl Bowie Primary Electrophysiologist: Thompson Grayer, MD    Chief Complaint  Patient presents with  . Atrial Fibrillation     History of Present Illness: Joshua [name] is a 76 y.o. male who presents today for electrophysiology evaluation.   The patient has longstanding persistent afib, nonischemic CM (Ef 35%), OSA (uses CPAP), and severe biatrial enlargement.  He is referred by Dr Harl Bowie for EP consultation regarding therapies for afib.  He has had afib since at least 2011.  He is minimally symptomatic.  He failed medical therapy with amiodarone.  He has been treated with a rate control strategy for quite some time.  Last echo was 02/2016 and revealed severe biatrial enlargement and reduced EF which was felt to be tachycardia mediated.  He feels that rates have been better controlled more recently and feels "the best (he's) felt in years". Overweight and has SOB with moderate activity.  Today, he denies symptoms of palpitations, chest pain, orthopnea, PND, lower extremity edema, claudication, dizziness, presyncope, syncope, bleeding, or neurologic sequela. The patient is tolerating medications without difficulties and is otherwise without complaint today.    Past Medical History:  Diagnosis Date  . 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 BPH (benign prostatic hyperplasia)   . Chronic anticoagulation   . Chronic systolic dysfunction of left ventricle    EF 35%  . Degenerative joint disease    Right knee  . Hyperlipidemia   . Hypertension   . Longstanding persistent atrial fibrillation ([name])   . Nonischemic cardiomyopathy ([name])   . Obesity   . Sleep apnea    Rx-CPAP   Past Surgical History:  Procedure Laterality Date  . CARDIAC CATHETERIZATION N/A 03/04/2016   Procedure: Left Heart Cath and Coronary Angiography;  Surgeon:  Troy Sine, MD;  Location: [facility];  Service: Cardiovascular;  Laterality: N/A;  . CARDIOVERSION N/A 05/30/2016   Procedure: CARDIOVERSION;  Surgeon: Arnoldo Lenis, MD;  Location: [facility];  Service: Endoscopy;  Laterality: N/A;  . CATARACT EXTRACTION W/PHACO Left 01/03/2017   Procedure: CATARACT EXTRACTION PHACO AND INTRAOCULAR LENS PLACEMENT (IOC);  Surgeon: Rutherford Guys, MD;  Location: [facility];  Service: Ophthalmology;  Laterality: Left;  CDE: 5.74  . COLONOSCOPY  03/10/2005  . 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 TOTAL KNEE ARTHROPLASTY Left 03/10/2014   Procedure: LEFT TOTAL KNEE ARTHROPLASTY;  Surgeon: Gearlean Alf, MD;  Location: [facility];  Service: Orthopedics;  Laterality: Left;  Marland Kitchen VENTRAL HERNIA REPAIR  03/2008, 02/2009     Current Outpatient Prescriptions  Medication Sig Dispense Refill  . carvedilol (COREG) 25 MG tablet Take 1 tablet (25 mg total) by mouth 2 (two) times daily. 180 tablet 3  . digoxin (LANOXIN) 0.125 MG tablet Take 1 tablet (0.125 mg total) by mouth daily. 90 tablet 3  . ezetimibe (ZETIA) 10 MG tablet Take 1 tablet (10 mg total) by mouth daily. 30 tablet 3  . furosemide (LASIX) 40 MG tablet Take 1 tablet (40 mg total) by mouth every other day. 45 tablet 3  . lisinopril (PRINIVIL,ZESTRIL) 20 MG tablet Take 1 tablet (20 mg total) by mouth 2 (two) times daily. 90 tablet 3  .  Omega-3 Fatty Acids (FISH OIL) 1000 MG CAPS Take 1,000 mg by mouth 2 (two) times daily.     . potassium chloride SA (KLOR-CON M20) 20 MEQ tablet Take 1 tablet (20 mEq total) by mouth every other day. 45 tablet 3  . warfarin (COUMADIN) 10 MG tablet Take 5-10 mg by mouth See admin instructions. 10 mg on Mondays & Thursdays, then 5 mg on all other days    . spironolactone (ALDACTONE) 25 MG tablet Take 1 tablet (25 mg total) by mouth daily. 30 tablet 3    No current facility-administered medications for this visit.     Allergies:   Patient has no known allergies.   Social History:  The patient  reports that he has been smoking Cigars.  He has never used smokeless tobacco. He reports that he does not drink alcohol or use drugs.   Family History:  The patient's  family history includes Heart attack in his father.    ROS:  Please see the history of present illness.   All other systems are personally reviewed and negative.    PHYSICAL EXAM: VS:  BP 136/74   Pulse 80   Ht 5\' 11"  (1.803 m)   Wt 245 lb (111.1 kg)   SpO2 95%   BMI 34.17 kg/m  , BMI Body mass index is 34.17 kg/m. GEN: overweight, in no acute distress  HEENT: normal  Neck: no JVD, carotid bruits, or masses Cardiac: iRRR; no murmurs, rubs, or gallops,no edema  Respiratory:  clear to auscultation bilaterally, normal work of breathing GI: soft, nontender, nondistended, + BS MS: no deformity or atrophy  Skin: warm and dry  Neuro:  Strength and sensation are intact Psych: euthymic mood, full affect  EKG:  EKG is ordered today. The ekg ordered today is personally reviewed and shows afib, V rate 77 bpm, QRS 102 msec, QTc 400 msec, nonspecific ST/T changes   Recent Labs: 02/15/2016: Brain Natriuretic Peptide 899.0 12/26/2016: BUN 29; Creatinine, Ser 1.20; Hemoglobin 14.2; Platelets 112; Potassium 4.1; Sodium 135  personally reviewed   Lipid Panel     Component Value Date/Time   CHOL 187 11/28/2012 0830   TRIG 286 (H) 11/28/2012 0830   HDL 30 (L) 11/28/2012 0830   CHOLHDL 6.2 11/28/2012 0830   VLDL 57 (H) 11/28/2012 0830   LDLCALC 100 (H) 11/28/2012 0830   personally reviewed   Wt Readings from Last 3 Encounters:  02/13/17 245 lb (111.1 kg)  01/03/17 246 lb (111.6 kg)  12/26/16 246 lb (111.6 kg)      Other studies personally reviewed: Additional studies/ records that were reviewed today include: Dr Nelly Laurence notes, prior echo  Review of the above records  today demonstrates: as above   ASSESSMENT AND PLAN:  1.  Longstanding persistent afib The patient has been in persistent afib for several years and has failed medical therapy with amiodarone.  He has severe biatrial enlargement.  Unfortunately, given this degree of atriopathy, he is not a candidate for afib ablation.  I would advise rate control long term.  Fortunately, he is doing well with his currently strategy and currently has no symptoms.  I did offer tikosyn as an option which he declines at this time. He prefers to continue his current medicine strategy. Repeat echo.   On coumadin.  Does not wish to take NOACs due to cost.  2.  Nonischemic CM/  Chronic systolic dysfunction Rate control of afib should help Repeat echo to reassess EF.  Would do best  with medical optimization long term.  As rates are currently well controlled, I do not feel that an AV nodal ablation would offer him benefit at this time.  3. OSA Compliance with CPAP encouraged  4. Obesity Body mass index is 34.17 kg/m. Lifestyle modification is encouraged   Follow-up with Dr Harl Bowie as scheduled I will see when needed going forward  Current medicines are reviewed at length with the patient today.   The patient does not have concerns regarding his medicines.  The following changes were made today:  none   Signed, Thompson Grayer, MD  02/13/2017 11:37 AM     [facility] [address] [place]  [address] [phone] (office) [phone] (fax)

## 2017-02-17 ENCOUNTER — Ambulatory Visit (HOSPITAL_COMMUNITY)
Admission: RE | Admit: 2017-02-17 | Discharge: 2017-02-17 | Disposition: A | Discharge status: Home / Self Care | Payer: Medicare Other | Source: Ambulatory Visit | Attending: Internal Medicine | Admitting: Internal Medicine

## 2017-02-17 DIAGNOSIS — I480 Paroxysmal atrial fibrillation: Secondary | ICD-10-CM | POA: Diagnosis not present

## 2017-02-17 DIAGNOSIS — I081 Rheumatic disorders of both mitral and tricuspid valves: Secondary | ICD-10-CM | POA: Insufficient documentation

## 2017-02-17 NOTE — Progress Notes (Signed)
*  PRELIMINARY RESULTS* Echocardiogram 2D Echocardiogram has been performed.  Joshua [name] 02/17/2017, 11:13 AM

## 2017-02-28 ENCOUNTER — Other Ambulatory Visit: Payer: Self-pay | Admitting: Cardiology

## 2017-03-01 ENCOUNTER — Ambulatory Visit (INDEPENDENT_AMBULATORY_CARE_PROVIDER_SITE_OTHER): Payer: Medicare Other | Admitting: *Deleted

## 2017-03-01 DIAGNOSIS — Z5181 Encounter for therapeutic drug level monitoring: Secondary | ICD-10-CM | POA: Diagnosis not present

## 2017-03-01 DIAGNOSIS — I4891 Unspecified atrial fibrillation: Secondary | ICD-10-CM

## 2017-03-01 LAB — POCT INR: INR: 1.7

## 2017-03-08 ENCOUNTER — Other Ambulatory Visit: Payer: Self-pay | Admitting: Cardiology

## 2017-03-11 ENCOUNTER — Other Ambulatory Visit: Payer: Self-pay | Admitting: Cardiology

## 2017-03-13 NOTE — Telephone Encounter (Signed)
This is Dr. Branch pt °

## 2017-03-25 ENCOUNTER — Other Ambulatory Visit: Payer: Self-pay | Admitting: Cardiology

## 2017-03-27 NOTE — Telephone Encounter (Signed)
REFILL 

## 2017-03-30 ENCOUNTER — Ambulatory Visit (INDEPENDENT_AMBULATORY_CARE_PROVIDER_SITE_OTHER): Payer: Medicare Other | Admitting: *Deleted

## 2017-03-30 DIAGNOSIS — Z5181 Encounter for therapeutic drug level monitoring: Secondary | ICD-10-CM

## 2017-03-30 DIAGNOSIS — I4891 Unspecified atrial fibrillation: Secondary | ICD-10-CM

## 2017-03-30 LAB — POCT INR: INR: 2.4

## 2017-04-25 ENCOUNTER — Ambulatory Visit (INDEPENDENT_AMBULATORY_CARE_PROVIDER_SITE_OTHER): Payer: Medicare Other | Admitting: *Deleted

## 2017-04-25 DIAGNOSIS — I4891 Unspecified atrial fibrillation: Secondary | ICD-10-CM | POA: Diagnosis not present

## 2017-04-25 DIAGNOSIS — Z5181 Encounter for therapeutic drug level monitoring: Secondary | ICD-10-CM | POA: Diagnosis not present

## 2017-04-25 DIAGNOSIS — Z961 Presence of intraocular lens: Secondary | ICD-10-CM | POA: Diagnosis not present

## 2017-04-25 LAB — POCT INR: INR: 2.3

## 2017-05-29 ENCOUNTER — Ambulatory Visit (INDEPENDENT_AMBULATORY_CARE_PROVIDER_SITE_OTHER): Payer: Medicare Other | Admitting: *Deleted

## 2017-05-29 DIAGNOSIS — Z5181 Encounter for therapeutic drug level monitoring: Secondary | ICD-10-CM

## 2017-05-29 DIAGNOSIS — I4891 Unspecified atrial fibrillation: Secondary | ICD-10-CM

## 2017-05-29 LAB — POCT INR: INR: 2.7

## 2017-06-21 ENCOUNTER — Other Ambulatory Visit: Payer: Self-pay

## 2017-06-21 MED ORDER — POTASSIUM CHLORIDE CRYS ER 20 MEQ PO TBCR: 20.0000 meq | EXTENDED_RELEASE_TABLET | Freq: Every day | ORAL | 0 refills | Status: DC

## 2017-06-21 MED ORDER — SPIRONOLACTONE 25 MG PO TABS: 25.0000 mg | ORAL_TABLET | Freq: Every day | ORAL | 0 refills | Status: DC

## 2017-06-21 MED ORDER — EZETIMIBE 10 MG PO TABS: ORAL_TABLET | ORAL | 3 refills | Status: DC

## 2017-07-10 ENCOUNTER — Ambulatory Visit (INDEPENDENT_AMBULATORY_CARE_PROVIDER_SITE_OTHER): Payer: Medicare Other | Admitting: *Deleted

## 2017-07-10 DIAGNOSIS — Z5181 Encounter for therapeutic drug level monitoring: Secondary | ICD-10-CM

## 2017-07-10 DIAGNOSIS — I4891 Unspecified atrial fibrillation: Secondary | ICD-10-CM

## 2017-07-10 LAB — POCT INR: INR: 3.5

## 2017-07-17 ENCOUNTER — Other Ambulatory Visit: Payer: Self-pay | Admitting: Cardiology

## 2017-07-18 ENCOUNTER — Telehealth: Payer: Self-pay | Admitting: Cardiology

## 2017-07-18 NOTE — Telephone Encounter (Signed)
°*  STAT* If patient is at the pharmacy, call can be transferred to refill team.   1. Which medications need to be refilled? (please list name of each medication and dose if known) lisinopril (PRINIVIL,ZESTRIL) 20 MG tablet [[id number]]    2. Which pharmacy/location (including street and city if local pharmacy) is medication to be sent to? [facility]  3. Do they need a 30 day or 90 day supply? Pt is scheduled to see Dr. Harl Bowie in November

## 2017-07-19 ENCOUNTER — Telehealth: Payer: Self-pay | Admitting: Cardiology

## 2017-07-19 MED ORDER — SPIRONOLACTONE 25 MG PO TABS: 25.0000 mg | ORAL_TABLET | Freq: Every day | ORAL | 3 refills | Status: DC

## 2017-07-19 MED ORDER — LISINOPRIL 20 MG PO TABS
20.0000 mg | ORAL_TABLET | Freq: Two times a day (BID) | ORAL | 3 refills | Status: DC
Start: 2017-07-19 — End: 2017-09-04

## 2017-07-19 NOTE — Telephone Encounter (Signed)
Done

## 2017-07-19 NOTE — Telephone Encounter (Signed)
Patient has scheduled f/u / please refill Spironolactone / tg

## 2017-07-21 DIAGNOSIS — E785 Hyperlipidemia, unspecified: Secondary | ICD-10-CM | POA: Diagnosis not present

## 2017-07-21 DIAGNOSIS — I5022 Chronic systolic (congestive) heart failure: Secondary | ICD-10-CM | POA: Diagnosis not present

## 2017-07-21 DIAGNOSIS — I482 Chronic atrial fibrillation: Secondary | ICD-10-CM | POA: Diagnosis not present

## 2017-07-21 DIAGNOSIS — Z79899 Other long term (current) drug therapy: Secondary | ICD-10-CM | POA: Diagnosis not present

## 2017-07-21 DIAGNOSIS — I10 Essential (primary) hypertension: Secondary | ICD-10-CM | POA: Diagnosis not present

## 2017-07-21 DIAGNOSIS — Z23 Encounter for immunization: Secondary | ICD-10-CM | POA: Diagnosis not present

## 2017-07-24 DIAGNOSIS — R739 Hyperglycemia, unspecified: Secondary | ICD-10-CM | POA: Diagnosis not present

## 2017-08-07 ENCOUNTER — Ambulatory Visit (INDEPENDENT_AMBULATORY_CARE_PROVIDER_SITE_OTHER): Payer: Medicare Other | Admitting: *Deleted

## 2017-08-07 DIAGNOSIS — I4891 Unspecified atrial fibrillation: Secondary | ICD-10-CM | POA: Diagnosis not present

## 2017-08-07 DIAGNOSIS — Z5181 Encounter for therapeutic drug level monitoring: Secondary | ICD-10-CM | POA: Diagnosis not present

## 2017-08-07 DIAGNOSIS — I480 Paroxysmal atrial fibrillation: Secondary | ICD-10-CM | POA: Diagnosis not present

## 2017-08-07 LAB — POCT INR: INR: 2.4

## 2017-08-17 ENCOUNTER — Other Ambulatory Visit: Payer: Self-pay | Admitting: Cardiology

## 2017-09-01 DIAGNOSIS — L821 Other seborrheic keratosis: Secondary | ICD-10-CM | POA: Diagnosis not present

## 2017-09-01 DIAGNOSIS — L814 Other melanin hyperpigmentation: Secondary | ICD-10-CM | POA: Diagnosis not present

## 2017-09-01 DIAGNOSIS — L820 Inflamed seborrheic keratosis: Secondary | ICD-10-CM | POA: Diagnosis not present

## 2017-09-01 DIAGNOSIS — D225 Melanocytic nevi of trunk: Secondary | ICD-10-CM | POA: Diagnosis not present

## 2017-09-01 DIAGNOSIS — Z85820 Personal history of malignant melanoma of skin: Secondary | ICD-10-CM | POA: Diagnosis not present

## 2017-09-01 DIAGNOSIS — D1801 Hemangioma of skin and subcutaneous tissue: Secondary | ICD-10-CM | POA: Diagnosis not present

## 2017-09-01 DIAGNOSIS — L570 Actinic keratosis: Secondary | ICD-10-CM | POA: Diagnosis not present

## 2017-09-04 ENCOUNTER — Ambulatory Visit (INDEPENDENT_AMBULATORY_CARE_PROVIDER_SITE_OTHER): Payer: Medicare Other | Admitting: Cardiology

## 2017-09-04 ENCOUNTER — Encounter: Payer: Self-pay | Admitting: *Deleted

## 2017-09-04 ENCOUNTER — Other Ambulatory Visit: Payer: Self-pay

## 2017-09-04 ENCOUNTER — Encounter: Payer: Self-pay | Admitting: Cardiology

## 2017-09-04 VITALS — BP 118/68 | HR 67 | Ht 70.0 in | Wt 240.0 lb

## 2017-09-04 DIAGNOSIS — I10 Essential (primary) hypertension: Secondary | ICD-10-CM | POA: Diagnosis not present

## 2017-09-04 DIAGNOSIS — E782 Mixed hyperlipidemia: Secondary | ICD-10-CM | POA: Diagnosis not present

## 2017-09-04 DIAGNOSIS — I4891 Unspecified atrial fibrillation: Secondary | ICD-10-CM | POA: Diagnosis not present

## 2017-09-04 DIAGNOSIS — I5022 Chronic systolic (congestive) heart failure: Secondary | ICD-10-CM

## 2017-09-04 NOTE — Patient Instructions (Signed)

## 2017-09-04 NOTE — Progress Notes (Signed)
Clinical Summary [name] is a 76 y.o.male  seen today for follow up of the following medical problems.   1. Afib - he failed extended course of amiodarone along with prior DCCV 05/30/16. Have been working toward rate control strategy - off dilt due to low LVEF. Remains on coreg. - no recent palpitations. Remains active - compliant with coumadin, no bleeding issues.   - no recent palpitations - compliant with meds. No recent bleeding troubles on coumadin - seen by EP, not thought to be an ablation candidate due to his LA size. Recs for rate control, could consider tikasyn but patient was not interested at the time.   - no recent palpitations.  - compliant with meds. No bleeding issues on coumadin, followed here in coumadin clinic.   2. Hyperlipidemia - was previously on prava, developed muscle aches. Stopped taking approx December, symptoms resolved. - reports he has tried multiple other statins with Dr Joshua [name] - he is on zetia currently and tolerating.    3. HTN - he is compliant with meds  4. Chronic systolic HF - echo 05/1190 LVEF 30-35%, severe basal infeiror hypokinesis. Cannot eval diasotlic function.  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 No recent edema.  - compliant with meds. He is not interested in entresto  Past Medical History:  Diagnosis Date  . 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 BPH (benign prostatic hyperplasia)   . Chronic anticoagulation   . Chronic systolic dysfunction of left ventricle    EF 35%  . Degenerative joint disease    Right knee  . Hyperlipidemia   . Hypertension   . Longstanding persistent atrial fibrillation (Joshua [name])   . Nonischemic cardiomyopathy (Joshua [name])   . Obesity   . Sleep apnea    Rx-CPAP     No Known Allergies   Current Outpatient Medications  Medication Sig Dispense Refill  . carvedilol  (COREG) 25 MG tablet TAKE 1 TABLET BY MOUTH TWICE DAILY 60 tablet 0  . digoxin (LANOXIN) 0.125 MG tablet Take 1 tablet (0.125 mg total) by mouth daily. 90 tablet 3  . ezetimibe (ZETIA) 10 MG tablet TAKE ONE TABLET BY MOUTH ONCE DAILY 30 tablet 3  . furosemide (LASIX) 40 MG tablet TAKE ONE TABLET BY MOUTH ONCE DAILY 90 tablet 4  . lisinopril (PRINIVIL,ZESTRIL) 20 MG tablet TAKE ONE TABLET BY MOUTH TWICE DAILY 60 tablet 0  . lisinopril (PRINIVIL,ZESTRIL) 20 MG tablet Take 1 tablet (20 mg total) by mouth 2 (two) times daily. 90 tablet 3  . potassium chloride SA (KLOR-CON M20) 20 MEQ tablet Take 1 tablet (20 mEq total) by mouth every other day. 45 tablet 3  . potassium chloride SA (KLOR-CON M20) 20 MEQ tablet Take 1 tablet (20 mEq total) by mouth daily. 30 tablet 0  . spironolactone (ALDACTONE) 25 MG tablet TAKE ONE TABLET BY MOUTH ONCE DAILY 30 tablet 0  . spironolactone (ALDACTONE) 25 MG tablet Take 1 tablet (25 mg total) by mouth daily. 30 tablet 3  . warfarin (COUMADIN) 10 MG tablet Take 5-10 mg by mouth See admin instructions. 10 mg on Mondays & Thursdays, then 5 mg on all other days    . warfarin (COUMADIN) 10 MG tablet TAKE ONE TABLET BY MOUTH ONCE DAILY EXCEPT ONE-HALF TABLET ON SUNDAYS, TUESDAYS AND THURSDAYS 45 tablet 4   No current facility-administered medications for this visit.  Past Surgical History:  Procedure Laterality Date  . COLONOSCOPY  03/10/2005  . VENTRAL HERNIA REPAIR  03/2008, 02/2009     No Known Allergies    Family History  Problem Relation Age of Onset  . Heart attack Father      Social History [name] reports that he has been smoking cigars.  he has never used smokeless tobacco. [name] reports that he does not drink alcohol.   Review of Systems CONSTITUTIONAL: No weight loss, fever, chills, weakness or fatigue.  HEENT: Eyes: No visual loss, blurred vision, double vision or yellow sclerae.No hearing loss, sneezing, congestion, runny nose  or sore throat.  SKIN: No rash or itching.  CARDIOVASCULAR: per hpi RESPIRATORY: per hpi GASTROINTESTINAL: No anorexia, nausea, vomiting or diarrhea. No abdominal pain or blood.  GENITOURINARY: No burning on urination, no polyuria NEUROLOGICAL: No headache, dizziness, syncope, paralysis, ataxia, numbness or tingling in the extremities. No change in bowel or bladder control.  MUSCULOSKELETAL: No muscle, back pain, joint pain or stiffness.  LYMPHATICS: No enlarged nodes. No history of splenectomy.  PSYCHIATRIC: No history of depression or anxiety.  ENDOCRINOLOGIC: No reports of sweating, cold or heat intolerance. No polyuria or polydipsia.  Marland Kitchen   Physical Examination Vitals:   09/04/17 0924  BP: 118/68  Pulse: 67  SpO2: 98%   Vitals:   09/04/17 0924  Weight: 240 lb (108.9 kg)  Height: 5\' 10"  (1.778 m)    Gen: resting comfortably, no acute distress HEENT: no scleral icterus, pupils equal round and reactive, no palptable cervical adenopathy,  CV: RRR, no mrg, no jvd Resp: Clear to auscultation bilaterally GI: abdomen is soft, non-tender, non-distended, normal bowel sounds, no hepatosplenomegaly MSK: extremities are warm, no edema.  Skin: warm, no rash Neuro:  no focal deficits Psych: appropriate affect   Diagnostic Studies Echo 09/2010: LVEF 55%, mild LVH, mild MR, mod LAE.   02/2016 Echo Study Conclusions  - Left ventricle: The cavity size was moderately dilated. Wall  thickness was at the upper limits of normal. Systolic function  was moderately to severely reduced. The estimated ejection  fraction was in the range of 30% to 35%. Diffuse hypokinesis.  There is severe hypokinesis of the basalinferior myocardium. The  study is not technically sufficient to allow evaluation of LV  diastolic function. - Mitral valve: Mildly thickened leaflets . There was mild  regurgitation. - Left atrium: The atrium was severely dilated. - Right ventricle: The cavity size was  mildly dilated. Systolic  function was mildly reduced. - Right atrium: The atrium was severely dilated. - Atrial septum: No defect or patent foramen ovale was identified. - Tricuspid valve: There was trivial regurgitation. Peak RV-RA  gradient (S): 26 mm Hg. - Pericardium, extracardiac: There was no pericardial effusion.  Impressions:  - Upper normal LV wall thickness with moderate chamber dilatation  and LVEF approximately 30-35% in the setting of atrial  fibrillation. LVEF decreased compared to previous study in 2011.  Diffuse hypokinesis, most prominent in the inferior wall.  Indeterminate diastolic function. Severe biatrial enlargement.  Mildly thickened mitral leaflets with mild mitral regurgitation.  Mildly dilated RV with reduced contraction. Trivial tricuspid  regurgitation, RV-RA gradient 26 mmHg. Unable to assess CVP.  02/2016 Cath  Prox LAD lesion, 10% stenosed.  Mid LAD lesion, 20% stenosed.  Ost 3rd Diag lesion, 40% stenosed.  Prox RCA lesion, 20% stenosed.   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 Left ventriculography was not done.   RECOMMENDATION: Medical therapy for his cardiomyopathy with improved rate control of his atrial fibrillation.     Assessment and Plan  1. Afib - no curent symptoms, rates really improved after starting digoxin - continue current meds - EKG in clinic today shows rate controlled afib  2. Hyperlipidemia - intolerant to statins - continue zetia  3. HTN - at goal, continue current meds  4. Chronic systolic HF - appears euvolemic - he will continue current meds      Arnoldo Lenis, M.D.

## 2017-09-07 ENCOUNTER — Encounter: Payer: Self-pay | Admitting: Cardiology

## 2017-09-11 ENCOUNTER — Other Ambulatory Visit: Payer: Self-pay | Admitting: Cardiology

## 2017-09-16 ENCOUNTER — Other Ambulatory Visit: Payer: Self-pay | Admitting: Cardiology

## 2017-09-18 ENCOUNTER — Ambulatory Visit (INDEPENDENT_AMBULATORY_CARE_PROVIDER_SITE_OTHER): Payer: Medicare Other | Admitting: *Deleted

## 2017-09-18 DIAGNOSIS — I4891 Unspecified atrial fibrillation: Secondary | ICD-10-CM | POA: Diagnosis not present

## 2017-09-18 DIAGNOSIS — Z5181 Encounter for therapeutic drug level monitoring: Secondary | ICD-10-CM | POA: Diagnosis not present

## 2017-09-18 LAB — POCT INR: INR: 2.5

## 2017-10-14 ENCOUNTER — Other Ambulatory Visit: Payer: Self-pay | Admitting: Cardiology

## 2017-10-22 ENCOUNTER — Other Ambulatory Visit: Payer: Self-pay | Admitting: Cardiology

## 2017-11-16 ENCOUNTER — Ambulatory Visit (INDEPENDENT_AMBULATORY_CARE_PROVIDER_SITE_OTHER): Payer: Medicare Other | Admitting: *Deleted

## 2017-11-16 DIAGNOSIS — Z5181 Encounter for therapeutic drug level monitoring: Secondary | ICD-10-CM

## 2017-11-16 DIAGNOSIS — I4891 Unspecified atrial fibrillation: Secondary | ICD-10-CM

## 2017-11-16 LAB — POCT INR: INR: 2.1

## 2017-11-16 NOTE — Patient Instructions (Signed)
Continue coumadin 1/2 tablet daily except 1 tablet on Mondays, Wednesdays and Fridays Recheck in 6 weeks 

## 2017-11-25 ENCOUNTER — Other Ambulatory Visit: Payer: Self-pay | Admitting: Cardiology

## 2017-12-22 ENCOUNTER — Other Ambulatory Visit: Payer: Self-pay | Admitting: Cardiology

## 2017-12-25 ENCOUNTER — Ambulatory Visit (INDEPENDENT_AMBULATORY_CARE_PROVIDER_SITE_OTHER): Payer: Medicare Other | Admitting: *Deleted

## 2017-12-25 DIAGNOSIS — I4891 Unspecified atrial fibrillation: Secondary | ICD-10-CM

## 2017-12-25 DIAGNOSIS — Z5181 Encounter for therapeutic drug level monitoring: Secondary | ICD-10-CM | POA: Diagnosis not present

## 2017-12-25 LAB — POCT INR: INR: 2.3

## 2017-12-25 NOTE — Patient Instructions (Signed)
Continue coumadin 1/2 tablet daily except 1 tablet on Mondays, Wednesdays and Fridays Recheck in 6 weeks 

## 2018-01-06 ENCOUNTER — Other Ambulatory Visit: Payer: Self-pay | Admitting: Cardiology

## 2018-01-30 ENCOUNTER — Ambulatory Visit (INDEPENDENT_AMBULATORY_CARE_PROVIDER_SITE_OTHER): Payer: Medicare Other | Admitting: *Deleted

## 2018-01-30 DIAGNOSIS — I4891 Unspecified atrial fibrillation: Secondary | ICD-10-CM

## 2018-01-30 DIAGNOSIS — Z5181 Encounter for therapeutic drug level monitoring: Secondary | ICD-10-CM | POA: Diagnosis not present

## 2018-01-30 LAB — POCT INR: INR: 2.3

## 2018-01-30 NOTE — Patient Instructions (Signed)
Continue coumadin 1/2 tablet daily except 1 tablet on Mondays, Wednesdays and Fridays Recheck in 6 weeks 

## 2018-02-12 ENCOUNTER — Other Ambulatory Visit: Payer: Self-pay | Admitting: Cardiology

## 2018-02-27 ENCOUNTER — Other Ambulatory Visit: Payer: Self-pay | Admitting: Cardiology

## 2018-02-27 DIAGNOSIS — D225 Melanocytic nevi of trunk: Secondary | ICD-10-CM | POA: Diagnosis not present

## 2018-02-27 DIAGNOSIS — Z85820 Personal history of malignant melanoma of skin: Secondary | ICD-10-CM | POA: Diagnosis not present

## 2018-02-27 DIAGNOSIS — L821 Other seborrheic keratosis: Secondary | ICD-10-CM | POA: Diagnosis not present

## 2018-02-27 DIAGNOSIS — L820 Inflamed seborrheic keratosis: Secondary | ICD-10-CM | POA: Diagnosis not present

## 2018-02-27 DIAGNOSIS — D1801 Hemangioma of skin and subcutaneous tissue: Secondary | ICD-10-CM | POA: Diagnosis not present

## 2018-02-27 DIAGNOSIS — B078 Other viral warts: Secondary | ICD-10-CM | POA: Diagnosis not present

## 2018-03-05 DIAGNOSIS — Z6838 Body mass index (BMI) 38.0-38.9, adult: Secondary | ICD-10-CM | POA: Diagnosis not present

## 2018-03-05 DIAGNOSIS — G4733 Obstructive sleep apnea (adult) (pediatric): Secondary | ICD-10-CM | POA: Diagnosis not present

## 2018-03-05 DIAGNOSIS — I482 Chronic atrial fibrillation: Secondary | ICD-10-CM | POA: Diagnosis not present

## 2018-03-05 DIAGNOSIS — I5022 Chronic systolic (congestive) heart failure: Secondary | ICD-10-CM | POA: Diagnosis not present

## 2018-03-14 ENCOUNTER — Ambulatory Visit (INDEPENDENT_AMBULATORY_CARE_PROVIDER_SITE_OTHER): Payer: Medicare Other | Admitting: *Deleted

## 2018-03-14 DIAGNOSIS — I4891 Unspecified atrial fibrillation: Secondary | ICD-10-CM | POA: Diagnosis not present

## 2018-03-14 DIAGNOSIS — Z5181 Encounter for therapeutic drug level monitoring: Secondary | ICD-10-CM | POA: Diagnosis not present

## 2018-03-14 LAB — POCT INR: INR: 3.4 — AB (ref 2.0–3.0)

## 2018-03-14 NOTE — Patient Instructions (Signed)
Take coumadin 1/2 tablet tonight then resume 1/2 tablet daily except 1 tablet on Mondays, Wednesdays and Fridays Recheck in 6 weeks

## 2018-03-23 ENCOUNTER — Encounter: Payer: Self-pay | Admitting: Cardiology

## 2018-03-23 ENCOUNTER — Ambulatory Visit (INDEPENDENT_AMBULATORY_CARE_PROVIDER_SITE_OTHER): Payer: Medicare Other | Admitting: Cardiology

## 2018-03-23 VITALS — BP 100/66 | HR 78 | Ht 70.0 in | Wt 243.0 lb

## 2018-03-23 DIAGNOSIS — I5022 Chronic systolic (congestive) heart failure: Secondary | ICD-10-CM

## 2018-03-23 DIAGNOSIS — I4891 Unspecified atrial fibrillation: Secondary | ICD-10-CM | POA: Diagnosis not present

## 2018-03-23 DIAGNOSIS — E782 Mixed hyperlipidemia: Secondary | ICD-10-CM | POA: Diagnosis not present

## 2018-03-23 DIAGNOSIS — I10 Essential (primary) hypertension: Secondary | ICD-10-CM

## 2018-03-23 NOTE — Progress Notes (Signed)
Clinical Summary Mr. Kirchman is a 77 y.o.male seen today for follow up of the following medical problems.   1. Afib - he failed extended course of amiodarone along with prior DCCV 05/30/16. Have been working toward rate control strategy - off dilt due to low LVEF. Remains on coreg.     - no recent palpitations - compliant with meds. No bleeding issues on coumadin.   2. Hyperlipidemia - was previously on prava, developed muscle aches. Stopped taking approx December, symptoms resolved. - reports he has tried multiple other statins with Dr Willey Blade - he is on zetia currently and tolerating.   - upcoming labs with pcp  3. HTN - compliant with meds  4. Chronic systolic HF - echo 0/3474 LVEF 30-35%, severe basal infeiror hypokinesis. Cannot eval diasotlic function.  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 NO recent edema - compliant with meds.      SH: heading to beach with family near [place]   Past Medical History:  Diagnosis Date  . 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 BPH (benign prostatic hyperplasia)   . Chronic anticoagulation   . Chronic systolic dysfunction of left ventricle    EF 35%  . Degenerative joint disease    Right knee  . Hyperlipidemia   . Hypertension   . Longstanding persistent atrial fibrillation ([place])   . Nonischemic cardiomyopathy ([place])   . Obesity   . Sleep apnea    Rx-CPAP     No Known Allergies   Current Outpatient Medications  Medication Sig Dispense Refill  . carvedilol (COREG) 25 MG tablet TAKE 1 TABLET BY MOUTH TWICE DAILY 180 tablet 3  . digoxin (LANOXIN) 0.125 MG tablet TAKE ONE TABLET BY MOUTH ONCE DAILY 90 tablet 3  . ezetimibe (ZETIA) 10 MG tablet TAKE 1 TABLET BY MOUTH ONCE DAILY 90 tablet 1  . furosemide (LASIX) 40 MG tablet TAKE ONE TABLET BY MOUTH  ONCE DAILY (Patient taking differently: TAKE ONE TABLET BY MOUTH EVERY OTHER DAY) 90 tablet 4  . KLOR-CON M20 20 MEQ tablet TAKE 1 TABLET BY MOUTH ONCE DAILY 30 tablet 6  . lisinopril (PRINIVIL,ZESTRIL) 20 MG tablet TAKE 1 TABLET BY MOUTH TWICE DAILY 180 tablet 1  . potassium chloride SA (KLOR-CON M20) 20 MEQ tablet Take 1 tablet (20 mEq total) by mouth every other day. 45 tablet 3  . spironolactone (ALDACTONE) 25 MG tablet TAKE 1 TABLET BY MOUTH ONCE DAILY -- **PATIENT NEEDS APPOINTMENT FOR FURTHER REFILLS** 30 tablet 6  . warfarin (COUMADIN) 10 MG tablet Take 1/2 tablet daily except 1 tablet on Mondays, Wednesdays and Fridays 45 tablet 4   No current facility-administered medications for this visit.      Past Surgical History:  Procedure Laterality Date  . CARDIAC CATHETERIZATION N/A 03/04/2016   Procedure: Left Heart Cath and Coronary Angiography;  Surgeon: Troy Sine, MD;  Location: [facility];  Service: Cardiovascular;  Laterality: N/A;  . CARDIOVERSION N/A 05/30/2016   Procedure: CARDIOVERSION;  Surgeon: Arnoldo Lenis, MD;  Location: [facility];  Service: Endoscopy;  Laterality: N/A;  . CATARACT EXTRACTION W/PHACO Left 01/03/2017   Procedure: CATARACT EXTRACTION PHACO AND INTRAOCULAR LENS PLACEMENT (IOC);  Surgeon: Rutherford Guys, MD;  Location: [facility];  Service: Ophthalmology;  Laterality: Left;  CDE: 5.74  . COLONOSCOPY  03/10/2005  .  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 TOTAL KNEE ARTHROPLASTY Left 03/10/2014   Procedure: LEFT TOTAL KNEE ARTHROPLASTY;  Surgeon: Gearlean Alf, MD;  Location: [facility];  Service: Orthopedics;  Laterality: Left;  Marland Kitchen VENTRAL HERNIA REPAIR  03/2008, 02/2009     No Known Allergies    Family History  Problem Relation Age of Onset  . Heart attack Father      Social History Mr. Tinnell reports that he has  been smoking cigars.  He has never used smokeless tobacco. Mr. Nygaard reports that he does not drink alcohol.   Review of Systems CONSTITUTIONAL: No weight loss, fever, chills, weakness or fatigue.  HEENT: Eyes: No visual loss, blurred vision, double vision or yellow sclerae.No hearing loss, sneezing, congestion, runny nose or sore throat.  SKIN: No rash or itching.  CARDIOVASCULAR: per hpi RESPIRATORY: No shortness of breath, cough or sputum.  GASTROINTESTINAL: No anorexia, nausea, vomiting or diarrhea. No abdominal pain or blood.  GENITOURINARY: No burning on urination, no polyuria NEUROLOGICAL: No headache, dizziness, syncope, paralysis, ataxia, numbness or tingling in the extremities. No change in bowel or bladder control.  MUSCULOSKELETAL: No muscle, back pain, joint pain or stiffness.  LYMPHATICS: No enlarged nodes. No history of splenectomy.  PSYCHIATRIC: No history of depression or anxiety.  ENDOCRINOLOGIC: No reports of sweating, cold or heat intolerance. No polyuria or polydipsia.  Marland Kitchen   Physical Examination Vitals:   03/23/18 0958  BP: 100/66  Pulse: 78  SpO2: 96%   Vitals:   03/23/18 0958  Weight: 243 lb (110.2 kg)  Height: 5\' 10"  (1.778 m)    Gen: resting comfortably, no acute distress HEENT: no scleral icterus, pupils equal round and reactive, no palptable cervical adenopathy,  CV: irreg, no m/r/g, no jvd Resp: Clear to auscultation bilaterally GI: abdomen is soft, non-tender, non-distended, normal bowel sounds, no hepatosplenomegaly MSK: extremities are warm, no edema.  Skin: warm, no rash Neuro:  no focal deficits Psych: appropriate affect   Diagnostic Studies Echo 09/2010: LVEF 55%, mild LVH, mild MR, mod LAE.   02/2016 Echo Study Conclusions  - Left ventricle: The cavity size was moderately dilated. Wall  thickness was at the upper limits of normal. Systolic function  was moderately to severely reduced. The estimated ejection  fraction was  in the range of 30% to 35%. Diffuse hypokinesis.  There is severe hypokinesis of the basalinferior myocardium. The  study is not technically sufficient to allow evaluation of LV  diastolic function. - Mitral valve: Mildly thickened leaflets . There was mild  regurgitation. - Left atrium: The atrium was severely dilated. - Right ventricle: The cavity size was mildly dilated. Systolic  function was mildly reduced. - Right atrium: The atrium was severely dilated. - Atrial septum: No defect or patent foramen ovale was identified. - Tricuspid valve: There was trivial regurgitation. Peak RV-RA  gradient (S): 26 mm Hg. - Pericardium, extracardiac: There was no pericardial effusion.  Impressions:  - Upper normal LV wall thickness with moderate chamber dilatation  and LVEF approximately 30-35% in the setting of atrial  fibrillation. LVEF decreased compared to previous study in 2011.  Diffuse hypokinesis, most prominent in the inferior wall.  Indeterminate diastolic function. Severe biatrial enlargement.  Mildly thickened mitral leaflets with mild mitral regurgitation.  Mildly dilated RV with reduced contraction. Trivial tricuspid  regurgitation, RV-RA gradient 26 mmHg. Unable to  assess CVP.  02/2016 Cath  Prox LAD lesion, 10% stenosed.  Mid LAD lesion, 20% stenosed.  Ost 3rd Diag lesion, 40% stenosed.  Prox RCA lesion, 20% stenosed.   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 Left ventriculography was not done.   RECOMMENDATION: Medical therapy for his cardiomyopathy with improved rate control of his atrial fibrillation.       Assessment and Plan  1. Afib - no symptoms, continue current meds including anticoagulation - we will check on the cost of eliquis for him as he has some  interest  2. Hyperlipidemia - intolerant to statins - he will continue zetia  3. HTN - at goal, conitnue current meds  4. Chronic systolic HF - no symptoms, he will conitnue current meds   F/u 6 months    Arnoldo Lenis, M.D.

## 2018-03-23 NOTE — Patient Instructions (Signed)

## 2018-03-30 ENCOUNTER — Encounter: Payer: Self-pay | Admitting: Cardiology

## 2018-04-20 ENCOUNTER — Other Ambulatory Visit: Payer: Self-pay | Admitting: Cardiology

## 2018-04-23 ENCOUNTER — Other Ambulatory Visit: Payer: Self-pay | Admitting: *Deleted

## 2018-04-23 ENCOUNTER — Ambulatory Visit (INDEPENDENT_AMBULATORY_CARE_PROVIDER_SITE_OTHER): Payer: Medicare Other | Admitting: *Deleted

## 2018-04-23 DIAGNOSIS — I4891 Unspecified atrial fibrillation: Secondary | ICD-10-CM

## 2018-04-23 DIAGNOSIS — Z5181 Encounter for therapeutic drug level monitoring: Secondary | ICD-10-CM | POA: Diagnosis not present

## 2018-04-23 LAB — POCT INR: INR: 3 (ref 2.0–3.0)

## 2018-04-23 MED ORDER — FUROSEMIDE 40 MG PO TABS: 40.0000 mg | ORAL_TABLET | ORAL | 1 refills | Status: DC

## 2018-04-23 NOTE — Patient Instructions (Signed)
Continue coumadin 1/2 tablet daily except 1 tablet on Mondays, Wednesdays and Fridays Recheck in 6 weeks 

## 2018-04-24 IMAGING — NM NM MYOCAR MULTI W/SPECT W/WALL MOTION & EF
2 series · 12 of 12 positions shown · non-contrast
Comparison: none

[Series 1: rest · 8.28mm/px · 6 of 64 frames shown]
[frame 6/64]
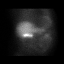
[frame 16/64]
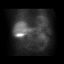
[frame 27/64]
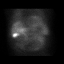
[frame 38/64]
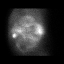
[frame 48/64]
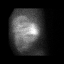
[frame 59/64]
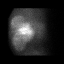

[Series 1: stress gated · 8.28mm/px · 6 of 64 frames shown]
[frame 6/64]
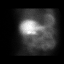
[frame 16/64]
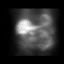
[frame 27/64]
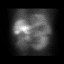
[frame 38/64]
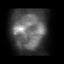
[frame 48/64]
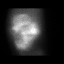
[frame 59/64]
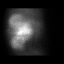

[12 of 12 positions shown; findings below may reference images not displayed]

Canned report from images found in remote index.

Refer to host system for actual result text.

## 2018-06-19 ENCOUNTER — Ambulatory Visit (INDEPENDENT_AMBULATORY_CARE_PROVIDER_SITE_OTHER): Payer: Medicare Other | Admitting: *Deleted

## 2018-06-19 DIAGNOSIS — I4891 Unspecified atrial fibrillation: Secondary | ICD-10-CM

## 2018-06-19 DIAGNOSIS — Z5181 Encounter for therapeutic drug level monitoring: Secondary | ICD-10-CM

## 2018-06-19 LAB — POCT INR: INR: 3 (ref 2.0–3.0)

## 2018-06-19 NOTE — Patient Instructions (Signed)
Continue coumadin 1/2 tablet daily except 1 tablet on Mondays, Wednesdays and Fridays Recheck in 6 weeks 

## 2018-07-10 ENCOUNTER — Telehealth: Payer: Self-pay | Admitting: Cardiology

## 2018-07-10 ENCOUNTER — Other Ambulatory Visit: Payer: Self-pay

## 2018-07-10 ENCOUNTER — Emergency Department (HOSPITAL_COMMUNITY)
Admission: EM | Admit: 2018-07-10 | Discharge: 2018-07-10 | Disposition: A | Discharge status: Home / Self Care | Payer: Medicare Other | Attending: Emergency Medicine | Admitting: Emergency Medicine

## 2018-07-10 ENCOUNTER — Emergency Department (HOSPITAL_COMMUNITY): Payer: Medicare Other

## 2018-07-10 ENCOUNTER — Encounter (HOSPITAL_COMMUNITY): Payer: Self-pay | Admitting: Emergency Medicine

## 2018-07-10 DIAGNOSIS — Z7901 Long term (current) use of anticoagulants: Secondary | ICD-10-CM | POA: Insufficient documentation

## 2018-07-10 DIAGNOSIS — R6883 Chills (without fever): Secondary | ICD-10-CM

## 2018-07-10 DIAGNOSIS — R509 Fever, unspecified: Secondary | ICD-10-CM | POA: Diagnosis not present

## 2018-07-10 DIAGNOSIS — I10 Essential (primary) hypertension: Secondary | ICD-10-CM | POA: Insufficient documentation

## 2018-07-10 DIAGNOSIS — I2510 Atherosclerotic heart disease of native coronary artery without angina pectoris: Secondary | ICD-10-CM | POA: Diagnosis not present

## 2018-07-10 DIAGNOSIS — J069 Acute upper respiratory infection, unspecified: Secondary | ICD-10-CM | POA: Diagnosis not present

## 2018-07-10 DIAGNOSIS — Z96653 Presence of artificial knee joint, bilateral: Secondary | ICD-10-CM | POA: Insufficient documentation

## 2018-07-10 DIAGNOSIS — Z79899 Other long term (current) drug therapy: Secondary | ICD-10-CM | POA: Diagnosis not present

## 2018-07-10 LAB — INFLUENZA PANEL BY PCR (TYPE A & B)
INFLAPCR: NEGATIVE
Influenza B By PCR: NEGATIVE

## 2018-07-10 MED ORDER — ACETAMINOPHEN 500 MG PO TABS
1000.0000 mg | ORAL_TABLET | Freq: Once | ORAL | Status: AC
  Administered 2018-07-10: 1000 mg via ORAL
  Filled 2018-07-10: qty 2

## 2018-07-10 NOTE — Telephone Encounter (Signed)
Per pt phone call--   This morning pt had really bad chills and couldn't get warm, had body aches all over, weakness, some tightness in his chest last night and was shaking so bad they couldn't tell if he had SOB. States he had a fever of 103 last night and now it's 101  Please give pt a call

## 2018-07-10 NOTE — ED Notes (Signed)
Unable to obtain signature for discharge due to pad not working, reviewed d/c instructions with pt and pt's wife who verbalized understanding and voiced no additional questions.

## 2018-07-10 NOTE — ED Triage Notes (Signed)
Pt c/o of chills and generalized body aches since last night.

## 2018-07-10 NOTE — Discharge Instructions (Signed)
Your vital signs have been reviewed.  Your oxygen level is 99% on room air.  Your influenza test is negative.  Your chest x-ray is negative for acute pneumonia or other problems.  Your examination favors an upper respiratory infection.  Please increase fluids.  Please wash hands frequently.  Please use Tylenol every 4 hours.  Please see Dr. Willey Blade in the office for follow-up and recheck, or return to the emergency department for recheck if not improving.

## 2018-07-10 NOTE — ED Notes (Signed)
Pt returned from xray

## 2018-07-10 NOTE — Telephone Encounter (Signed)
lmtcb-cc pt needs to see pcp today        Pt called back, states he as told pcp not in office, I advise him to go to the ED, he agreed

## 2018-07-10 NOTE — ED Provider Notes (Signed)
[facility] EMERGENCY DEPARTMENT Provider Note   CSN: [id number] Arrival date & time: 07/10/18  0920     History   Chief Complaint Chief Complaint  Patient presents with  . Chills    HPI BEXLEY MCLESTER is a 77 y.o. male.  Patient is a 77 year old male who presents to the emergency department because of chills and body aches.  The patient states that he was in his usual state of health until yesterday September 16 when he started having body aches and chills.  He had his temperature taken, and it was 103.  He has been using some Tylenol, but he says as soon as the Tylenol seems to wear off his body aches come back.  He denies any sore throat.  He has not had any increase in urination or dysuria.  He has not had any unusual rash.  Patient denies any excessive cough.  There is been no hemoptysis reported.  No abdominal pain, and no diarrhea reported.  The history is provided by the patient.    Past Medical History:  Diagnosis Date  . 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 BPH (benign prostatic hyperplasia)   . Chronic anticoagulation   . Chronic systolic dysfunction of left ventricle    EF 35%  . Degenerative joint disease    Right knee  . Hyperlipidemia   . Hypertension   . Longstanding persistent atrial fibrillation ([place])   . Nonischemic cardiomyopathy ([place])   . Obesity   . Sleep apnea    Rx-CPAP    Patient Active Problem List   Diagnosis Date Noted  . CAD- minor CAD at cath 03/04/16 03/06/2016  . Acute CHF - secondary to AF with RVR 03/06/2016  . AF (atrial fibrillation) ([place]) 03/04/2016  . Abnormal nuclear stress test   . PAF (paroxysmal atrial fibrillation) ([place])   . Non-ischemic cardiomyopathy ([place])   . Edema 03/31/2014  . Encounter for therapeutic drug monitoring 12/23/2013  . Knee stiffness 02/06/2013  . Knee pain 02/06/2013  . Difficulty in walking(719.7) 02/06/2013  . Acute blood loss anemia 01/17/2013  . Postop Hyponatremia 01/15/2013  . OA  (osteoarthritis) of knee 01/14/2013  . Thrombocytopenia ([place]) 05/26/2011  . Sleep apnea   . Degenerative joint disease   . Chronic anticoagulation 02/02/2011  . Hyperlipidemia 07/29/2010  . Hypertension 12/07/2007    Past Surgical History:  Procedure Laterality Date  . CARDIAC CATHETERIZATION N/A 03/04/2016   Procedure: Left Heart Cath and Coronary Angiography;  Surgeon: Troy Sine, MD;  Location: [place] [facility];  Service: Cardiovascular;  Laterality: N/A;  . CARDIOVERSION N/A 05/30/2016   Procedure: CARDIOVERSION;  Surgeon: Arnoldo Lenis, MD;  Location: [facility];  Service: Endoscopy;  Laterality: N/A;  . CATARACT EXTRACTION W/PHACO Left 01/03/2017   Procedure: CATARACT EXTRACTION PHACO AND INTRAOCULAR LENS PLACEMENT (IOC);  Surgeon: Rutherford [place], MD;  Location: [facility];  Service: Ophthalmology;  Laterality: Left;  CDE: 5.74  . COLONOSCOPY  03/10/2005  . 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 TOTAL KNEE ARTHROPLASTY Left 03/10/2014   Procedure: LEFT TOTAL KNEE ARTHROPLASTY;  Surgeon: Gearlean Alf, MD;  Location: [facility];  Service: Orthopedics;  Laterality: Left;  Marland Kitchen VENTRAL HERNIA REPAIR  03/2008, 02/2009        Home Medications    Prior to Admission medications   Medication Sig  Start Date End Date Taking? Authorizing Provider  carvedilol (COREG) 25 MG tablet TAKE 1 TABLET BY MOUTH TWICE DAILY 10/16/17   Arnoldo Lenis, MD  digoxin (LANOXIN) 0.125 MG tablet TAKE ONE TABLET BY MOUTH ONCE DAILY 09/11/17   Arnoldo Lenis, MD  ezetimibe (ZETIA) 10 MG tablet TAKE 1 TABLET BY MOUTH ONCE DAILY 02/28/18   Arnoldo Lenis, MD  furosemide (LASIX) 40 MG tablet Take 1 tablet (40 mg total) by mouth every other day. 04/23/18   Erlene Quan, PA-C  lisinopril (PRINIVIL,ZESTRIL) 20 MG tablet TAKE 1 TABLET BY MOUTH TWICE DAILY 02/12/18   Arnoldo Lenis, MD  potassium chloride SA (KLOR-CON M20) 20 MEQ tablet Take 1 tablet (20 mEq total) by mouth every other day. 04/13/16   Arnoldo Lenis, MD  spironolactone (ALDACTONE) 25 MG tablet TAKE 1 TABLET BY MOUTH ONCE DAILY -- **PATIENT NEEDS APPOINTMENT FOR FURTHER REFILLS** 12/22/17   Arnoldo Lenis, MD  warfarin (COUMADIN) 10 MG tablet Take 1/2 tablet daily except 1 tablet on Mondays, Wednesdays and Fridays 01/08/18   Arnoldo Lenis, MD    Family History Family History  Problem Relation Age of Onset  . Heart attack Father     Social History Social History   Tobacco Use  . Smoking status: Light Tobacco Smoker    Types: Cigars  . Smokeless tobacco: Never Used  . Tobacco comment: Occasional cigar twice a year   Substance Use Topics  . Alcohol use: No    Alcohol/week: 0.0 standard drinks  . Drug use: No     Allergies   Patient has no known allergies.   Review of Systems Review of Systems  Constitutional: Positive for chills and fever. Negative for activity change.       All ROS Neg except as noted in HPI  HENT: Negative for nosebleeds.   Eyes: Negative for photophobia and discharge.  Respiratory: Negative for cough, shortness of breath and wheezing.   Cardiovascular: Negative for chest pain and palpitations.  Gastrointestinal: Negative for abdominal pain and blood in stool.  Genitourinary: Negative for dysuria, frequency and hematuria.  Musculoskeletal: Positive for arthralgias and myalgias. Negative for back pain and neck pain.  Skin: Negative.   Neurological: Negative for dizziness, seizures and speech difficulty.  Psychiatric/Behavioral: Negative for confusion and hallucinations.     Physical Exam Updated Vital Signs BP 117/65 (BP Location: Right Arm)   Pulse 92   Temp 99 F (37.2 C) (Oral)   Resp 20   Ht 5\' 10"  (1.778 m)   Wt 104.3 kg   SpO2 99%   BMI 33.00 kg/m   Physical Exam  Constitutional: He is oriented to person, place, and time. He appears  well-developed and well-nourished.  Non-toxic appearance.  HENT:  Head: Normocephalic.  Right Ear: Tympanic membrane and external ear normal.  Left Ear: Tympanic membrane and external ear normal.  Eyes: Pupils are equal, round, and reactive to light. EOM and lids are normal.  Neck: Normal range of motion. Neck supple. Carotid bruit is not present.  Cardiovascular: Normal rate, normal heart sounds, intact distal pulses and normal pulses. An irregularly irregular rhythm present.  Pulmonary/Chest: Breath sounds normal. No respiratory distress.  Abdominal: Soft. Bowel sounds are normal. There is no tenderness. There is no guarding.  Musculoskeletal: Normal range of motion.  No hot joints appreciated.  No edema of the extremities noted.  Negative Homans sign.  Lymphadenopathy:       Head (right side): No  submandibular adenopathy present.       Head (left side): No submandibular adenopathy present.    He has no cervical adenopathy.  Neurological: He is alert and oriented to person, place, and time. He has normal strength. No cranial nerve deficit or sensory deficit. He exhibits normal muscle tone. Coordination normal.  Skin: Skin is warm and dry. No rash noted.  Psychiatric: He has a normal mood and affect. His speech is normal.  Nursing note and vitals reviewed.    ED Treatments / Results  Labs (all labs ordered are listed, but only abnormal results are displayed) Labs Reviewed - No data to display  EKG None  Radiology No results found.  Procedures Procedures (including critical care time)  Medications Ordered in ED Medications - No data to display   Initial Impression / Assessment and Plan / ED Course  I have reviewed the triage vital signs and the nursing notes.  Pertinent labs & imaging results that were available during my care of the patient were reviewed by me and considered in my medical decision making (see chart for details).       Final Clinical Impressions(s)  / ED Diagnoses MDM  Vital signs reviewed.  Pulse oximetry is 97 and 99% on room air.  Within normal limits by my interpretation.  The patient is awake and alert and ambulatory.  Does not appear in distress.  His history indicates that he had some difficulty on last night with temperature elevation and generally not feeling well.  Patient will be evaluated for his chills and temperature elevation.  The patient denies any unusual sore throat.  He has not had earache, he has not had unusual neck stiffness to be reported.  His influenza test is negative.  The patient denies diarrhea, stomach pain.  He has no pain to abdominal evaluation on his examination.  The patient denies any dysuria.  He has not had increased urine frequency.  He has not had any blood in his urine.  He has not had rectal pain or prostate related pain.  No unusual rash appreciated.  No broken skin areas reported.  The chest x-ray is negative for active cardiopulmonary disease.  I suspect that the patient has a viral illness.  I have asked him to increase Tylenol to every 4 hours.  I have asked him to increase fluids.  Explained to him and his significant other to wash hands frequently.  The patient will use his mask until symptoms have resolved.  Questions were answered.  Patient also seen by Dr. Rogene Houston.   Final diagnoses:  Chills  Upper respiratory tract infection, unspecified type    ED Discharge Orders    None       Lily Kocher, PA-C 07/10/18 1343    Fredia Sorrow, MD 07/10/18 1558

## 2018-07-19 ENCOUNTER — Other Ambulatory Visit: Payer: Self-pay | Admitting: Cardiology

## 2018-08-04 ENCOUNTER — Other Ambulatory Visit: Payer: Self-pay | Admitting: Cardiology

## 2018-08-06 ENCOUNTER — Ambulatory Visit (INDEPENDENT_AMBULATORY_CARE_PROVIDER_SITE_OTHER): Payer: Medicare Other | Admitting: *Deleted

## 2018-08-06 ENCOUNTER — Other Ambulatory Visit: Payer: Self-pay | Admitting: Cardiology

## 2018-08-06 DIAGNOSIS — Z5181 Encounter for therapeutic drug level monitoring: Secondary | ICD-10-CM | POA: Diagnosis not present

## 2018-08-06 DIAGNOSIS — I4891 Unspecified atrial fibrillation: Secondary | ICD-10-CM | POA: Diagnosis not present

## 2018-08-06 LAB — POCT INR: INR: 2.8 (ref 2.0–3.0)

## 2018-08-06 NOTE — Telephone Encounter (Signed)
Medication sent to pharmacy  

## 2018-08-06 NOTE — Patient Instructions (Signed)
Continue coumadin 1/2 tablet daily except 1 tablet on Mondays, Wednesdays and Fridays Recheck in 6 weeks 

## 2018-08-06 NOTE — Telephone Encounter (Signed)
°  1. Which medications need to be refilled? (please list name of each medication and dose if known) Lisinopril 20mg    2. Which pharmacy/location (including street and city if local pharmacy) is medication to be sent to? [facility] [place] [facility] [address] in [place]  3. Do they need a 30 day or 90 day supply? 90 day supply, patient takes one tablet by mouth twice a day. (180 tablets needed)  Last Filled: 05/11/2018

## 2018-09-01 DIAGNOSIS — E119 Type 2 diabetes mellitus without complications: Secondary | ICD-10-CM | POA: Diagnosis not present

## 2018-09-01 DIAGNOSIS — G4733 Obstructive sleep apnea (adult) (pediatric): Secondary | ICD-10-CM | POA: Diagnosis not present

## 2018-09-01 DIAGNOSIS — Z79899 Other long term (current) drug therapy: Secondary | ICD-10-CM | POA: Diagnosis not present

## 2018-09-01 DIAGNOSIS — I5022 Chronic systolic (congestive) heart failure: Secondary | ICD-10-CM | POA: Diagnosis not present

## 2018-09-01 DIAGNOSIS — I482 Chronic atrial fibrillation, unspecified: Secondary | ICD-10-CM | POA: Diagnosis not present

## 2018-09-03 DIAGNOSIS — D1801 Hemangioma of skin and subcutaneous tissue: Secondary | ICD-10-CM | POA: Diagnosis not present

## 2018-09-03 DIAGNOSIS — L821 Other seborrheic keratosis: Secondary | ICD-10-CM | POA: Diagnosis not present

## 2018-09-03 DIAGNOSIS — L570 Actinic keratosis: Secondary | ICD-10-CM | POA: Diagnosis not present

## 2018-09-03 DIAGNOSIS — Z85820 Personal history of malignant melanoma of skin: Secondary | ICD-10-CM | POA: Diagnosis not present

## 2018-09-03 DIAGNOSIS — D225 Melanocytic nevi of trunk: Secondary | ICD-10-CM | POA: Diagnosis not present

## 2018-09-07 ENCOUNTER — Other Ambulatory Visit: Payer: Self-pay | Admitting: Cardiology

## 2018-09-10 DIAGNOSIS — Z23 Encounter for immunization: Secondary | ICD-10-CM | POA: Diagnosis not present

## 2018-09-10 DIAGNOSIS — E875 Hyperkalemia: Secondary | ICD-10-CM | POA: Diagnosis not present

## 2018-09-10 DIAGNOSIS — E785 Hyperlipidemia, unspecified: Secondary | ICD-10-CM | POA: Diagnosis not present

## 2018-09-10 DIAGNOSIS — D696 Thrombocytopenia, unspecified: Secondary | ICD-10-CM | POA: Diagnosis not present

## 2018-09-10 DIAGNOSIS — N183 Chronic kidney disease, stage 3 (moderate): Secondary | ICD-10-CM | POA: Diagnosis not present

## 2018-09-19 DIAGNOSIS — I482 Chronic atrial fibrillation, unspecified: Secondary | ICD-10-CM | POA: Diagnosis not present

## 2018-09-19 DIAGNOSIS — Z79899 Other long term (current) drug therapy: Secondary | ICD-10-CM | POA: Diagnosis not present

## 2018-09-19 DIAGNOSIS — E875 Hyperkalemia: Secondary | ICD-10-CM | POA: Diagnosis not present

## 2018-09-19 DIAGNOSIS — N183 Chronic kidney disease, stage 3 (moderate): Secondary | ICD-10-CM | POA: Diagnosis not present

## 2018-09-28 ENCOUNTER — Ambulatory Visit (INDEPENDENT_AMBULATORY_CARE_PROVIDER_SITE_OTHER): Payer: Medicare Other | Admitting: *Deleted

## 2018-09-28 DIAGNOSIS — I4891 Unspecified atrial fibrillation: Secondary | ICD-10-CM | POA: Diagnosis not present

## 2018-09-28 DIAGNOSIS — Z5181 Encounter for therapeutic drug level monitoring: Secondary | ICD-10-CM | POA: Diagnosis not present

## 2018-09-28 LAB — POCT INR: INR: 2.3 (ref 2.0–3.0)

## 2018-09-28 NOTE — Patient Instructions (Signed)
Continue coumadin 1/2 tablet daily except 1 tablet on Mondays, Wednesdays and Fridays Recheck in 6 weeks 

## 2018-10-12 ENCOUNTER — Other Ambulatory Visit: Payer: Self-pay | Admitting: Cardiology

## 2018-10-20 ENCOUNTER — Other Ambulatory Visit: Payer: Self-pay | Admitting: Cardiology

## 2018-10-26 DIAGNOSIS — E785 Hyperlipidemia, unspecified: Secondary | ICD-10-CM | POA: Diagnosis not present

## 2018-10-26 DIAGNOSIS — Z79899 Other long term (current) drug therapy: Secondary | ICD-10-CM | POA: Diagnosis not present

## 2018-11-05 ENCOUNTER — Ambulatory Visit (INDEPENDENT_AMBULATORY_CARE_PROVIDER_SITE_OTHER): Payer: Medicare Other | Admitting: Cardiology

## 2018-11-05 ENCOUNTER — Encounter: Payer: Self-pay | Admitting: Cardiology

## 2018-11-05 ENCOUNTER — Encounter: Payer: Self-pay | Admitting: *Deleted

## 2018-11-05 VITALS — BP 106/71 | HR 85 | Ht 70.0 in | Wt 242.6 lb

## 2018-11-05 DIAGNOSIS — E782 Mixed hyperlipidemia: Secondary | ICD-10-CM | POA: Diagnosis not present

## 2018-11-05 DIAGNOSIS — I10 Essential (primary) hypertension: Secondary | ICD-10-CM | POA: Diagnosis not present

## 2018-11-05 DIAGNOSIS — E875 Hyperkalemia: Secondary | ICD-10-CM | POA: Diagnosis not present

## 2018-11-05 DIAGNOSIS — I5022 Chronic systolic (congestive) heart failure: Secondary | ICD-10-CM

## 2018-11-05 DIAGNOSIS — E785 Hyperlipidemia, unspecified: Secondary | ICD-10-CM | POA: Diagnosis not present

## 2018-11-05 DIAGNOSIS — I4891 Unspecified atrial fibrillation: Secondary | ICD-10-CM

## 2018-11-05 NOTE — Patient Instructions (Signed)
Medication Instructions:  Continue all current medications.  Labwork: none  Testing/Procedures: none  Follow-Up: 4 months   Any Other Special Instructions Will Be Listed Below (If Applicable).  If you need a refill on your cardiac medications before your next appointment, please call your pharmacy.\ 

## 2018-11-05 NOTE — Progress Notes (Signed)
Clinical Summary Mr. Pauley is a 78 y.o.male seen today for follow up of the following medical problems.   1. Afib - he failed extended course of amiodarone along with prior DCCV 05/30/16. Have been working toward rate control strategy - off dilt due to low LVEF. Remains on coreg.    - no recent palpitations  compliant wiith meds. PCP stopped potassium due to elevated levels, also spironolactone stopped   2. Hyperlipidemia - was previously on prava, developed muscle aches. Stopped taking approx December, symptoms resolved. - reports he has tried multiple other statins with Dr Willey Blade -he is on zetiacurrently and tolerating.  -compliant with zetia, labs done by pcp  3. HTN - compliant with meds  4. Chronic systolic HF - echo 06/5187 LVEF 30-35%, severe basal infeiror hypokinesis. Cannot eval diasotlic function.  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 No recent edema.    Past Medical History:  Diagnosis Date  . 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 BPH (benign prostatic hyperplasia)   . Chronic anticoagulation   . Chronic systolic dysfunction of left ventricle    EF 35%  . Degenerative joint disease    Right knee  . Hyperlipidemia   . Hypertension   . Longstanding persistent atrial fibrillation ([place])   . Nonischemic cardiomyopathy ([place])   . Obesity   . Sleep apnea    Rx-CPAP     No Known Allergies   Current Outpatient Medications  Medication Sig Dispense Refill  . carvedilol (COREG) 25 MG tablet TAKE 1 TABLET BY MOUTH TWICE DAILY 180 tablet 1  . digoxin (LANOXIN) 0.125 MG tablet TAKE 1 TABLET BY MOUTH ONCE DAILY 90 tablet 3  . ezetimibe (ZETIA) 10 MG tablet TAKE 1 TABLET BY MOUTH ONCE DAILY 90 tablet 3  . furosemide (LASIX) 40 MG tablet Take 1 tablet (40 mg total) by mouth  every other day. 90 tablet 1  . lisinopril (PRINIVIL,ZESTRIL) 20 MG tablet TAKE 1 TABLET BY MOUTH TWICE DAILY 180 tablet 0  . warfarin (COUMADIN) 10 MG tablet Take 1/2 tablet daily except 1 tablet on Mondays, Wednesdays and Fridays 45 tablet 4   No current facility-administered medications for this visit.      Past Surgical History:  Procedure Laterality Date  . CARDIAC CATHETERIZATION N/A 03/04/2016   Procedure: Left Heart Cath and Coronary Angiography;  Surgeon: Troy Sine, MD;  Location: [place] [facility];  Service: Cardiovascular;  Laterality: N/A;  . CARDIOVERSION N/A 05/30/2016   Procedure: CARDIOVERSION;  Surgeon: Arnoldo Lenis, MD;  Location: [facility];  Service: Endoscopy;  Laterality: N/A;  . CATARACT EXTRACTION W/PHACO Left 01/03/2017   Procedure: CATARACT EXTRACTION PHACO AND INTRAOCULAR LENS PLACEMENT (IOC);  Surgeon: Rutherford [place], MD;  Location: [facility];  Service: Ophthalmology;  Laterality: Left;  CDE: 5.74  . COLONOSCOPY  03/10/2005  . 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 TOTAL KNEE ARTHROPLASTY Left 03/10/2014   Procedure: LEFT TOTAL KNEE ARTHROPLASTY;  Surgeon: Gearlean Alf, MD;  [facility];  Service: Orthopedics;  Laterality: Left;  Marland Kitchen VENTRAL HERNIA REPAIR  03/2008, 02/2009     No Known Allergies    Family History  Problem Relation Age of  Onset  . Heart attack Father      Social History Mr. Bogle reports that he has been smoking cigars. He has never used smokeless tobacco. Mr. Dresch reports no history of alcohol use.   Review of Systems CONSTITUTIONAL: No weight loss, fever, chills, weakness or fatigue.  HEENT: Eyes: No visual loss, blurred vision, double vision or yellow sclerae.No hearing loss, sneezing, congestion, runny nose or sore throat.  SKIN: No rash or itching.  CARDIOVASCULAR: per  hpi RESPIRATORY: No shortness of breath, cough or sputum.  GASTROINTESTINAL: No anorexia, nausea, vomiting or diarrhea. No abdominal pain or blood.  GENITOURINARY: No burning on urination, no polyuria NEUROLOGICAL: No headache, dizziness, syncope, paralysis, ataxia, numbness or tingling in the extremities. No change in bowel or bladder control.  MUSCULOSKELETAL: No muscle, back pain, joint pain or stiffness.  LYMPHATICS: No enlarged nodes. No history of splenectomy.  PSYCHIATRIC: No history of depression or anxiety.  ENDOCRINOLOGIC: No reports of sweating, cold or heat intolerance. No polyuria or polydipsia.  Marland Kitchen   Physical Examination Vitals:   11/05/18 0936  BP: 106/71  Pulse: 85  SpO2: 96%   Vitals:   11/05/18 0936  Weight: 242 lb 9.6 oz (110 kg)  Height: 5\' 10"  (1.778 m)    Gen: resting comfortably, no acute distress HEENT: no scleral icterus, pupils equal round and reactive, no palptable cervical adenopathy,  CV: irreg, no m/r/g, no jvd Resp: Clear to auscultation bilaterally GI: abdomen is soft, non-tender, non-distended, normal bowel sounds, no hepatosplenomegaly MSK: extremities are warm, no edema.  Skin: warm, no rash Neuro:  no focal deficits Psych: appropriate affect   Diagnostic Studies Echo 09/2010: LVEF 55%, mild LVH, mild MR, mod LAE.   02/2016 Echo Study Conclusions  - Left ventricle: The cavity size was moderately dilated. Wall  thickness was at the upper limits of normal. Systolic function  was moderately to severely reduced. The estimated ejection  fraction was in the range of 30% to 35%. Diffuse hypokinesis.  There is severe hypokinesis of the basalinferior myocardium. The  study is not technically sufficient to allow evaluation of LV  diastolic function. - Mitral valve: Mildly thickened leaflets . There was mild  regurgitation. - Left atrium: The atrium was severely dilated. - Right ventricle: The cavity size was mildly dilated.  Systolic  function was mildly reduced. - Right atrium: The atrium was severely dilated. - Atrial septum: No defect or patent foramen ovale was identified. - Tricuspid valve: There was trivial regurgitation. Peak RV-RA  gradient (S): 26 mm Hg. - Pericardium, extracardiac: There was no pericardial effusion.  Impressions:  - Upper normal LV wall thickness with moderate chamber dilatation  and LVEF approximately 30-35% in the setting of atrial  fibrillation. LVEF decreased compared to previous study in 2011.  Diffuse hypokinesis, most prominent in the inferior wall.  Indeterminate diastolic function. Severe biatrial enlargement.  Mildly thickened mitral leaflets with mild mitral regurgitation.  Mildly dilated RV with reduced contraction. Trivial tricuspid  regurgitation, RV-RA gradient 26 mmHg. Unable to assess CVP.  02/2016 Cath  Prox LAD lesion, 10% stenosed.  Mid LAD lesion, 20% stenosed.  Ost 3rd Diag lesion, 40% stenosed.  Prox RCA lesion, 20% stenosed.   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 Left ventriculography was not done.   RECOMMENDATION: Medical therapy for his cardiomyopathy with improved rate control of  his atrial fibrillation.     Assessment and Plan  1. Afib - no recent symptoms, continue current meds - EKG today shows rate controlled afib  2. Hyperlipidemia - intolerant to statins - reqyuest labs from pcp, continue zetia  3. HTN - he is at goal, continue current meds  4. Chronic systolic HF - euvolemic, no symtoms - continue current meds. Not interated in entersto, adlactone just stopped recently by pcp due to hyperkalemia  F/u 4 months      Arnoldo Lenis, M.D.

## 2018-11-12 ENCOUNTER — Ambulatory Visit (INDEPENDENT_AMBULATORY_CARE_PROVIDER_SITE_OTHER): Payer: Medicare Other | Admitting: Pharmacist

## 2018-11-12 DIAGNOSIS — I4891 Unspecified atrial fibrillation: Secondary | ICD-10-CM | POA: Diagnosis not present

## 2018-11-12 DIAGNOSIS — Z5181 Encounter for therapeutic drug level monitoring: Secondary | ICD-10-CM | POA: Diagnosis not present

## 2018-11-12 LAB — POCT INR: INR: 2.3 (ref 2.0–3.0)

## 2018-11-12 NOTE — Patient Instructions (Signed)
Description   Continue coumadin 1/2 tablet daily except 1 tablet on Mondays, Wednesdays and Fridays Recheck in 6 weeks

## 2018-11-22 ENCOUNTER — Other Ambulatory Visit: Payer: Self-pay | Admitting: Cardiology

## 2018-11-30 DIAGNOSIS — G4733 Obstructive sleep apnea (adult) (pediatric): Secondary | ICD-10-CM | POA: Diagnosis not present

## 2018-12-21 ENCOUNTER — Other Ambulatory Visit: Payer: Self-pay | Admitting: Cardiology

## 2018-12-31 ENCOUNTER — Ambulatory Visit (INDEPENDENT_AMBULATORY_CARE_PROVIDER_SITE_OTHER): Payer: Medicare Other | Admitting: *Deleted

## 2018-12-31 DIAGNOSIS — Z5181 Encounter for therapeutic drug level monitoring: Secondary | ICD-10-CM

## 2018-12-31 DIAGNOSIS — I4891 Unspecified atrial fibrillation: Secondary | ICD-10-CM

## 2018-12-31 LAB — POCT INR: INR: 2.2 (ref 2.0–3.0)

## 2018-12-31 NOTE — Patient Instructions (Signed)
Continue coumadin 1/2 tablet daily except 1 tablet on Mondays, Wednesdays and Fridays Recheck in 6 weeks 

## 2019-01-19 ENCOUNTER — Other Ambulatory Visit: Payer: Self-pay | Admitting: Cardiology

## 2019-02-01 DIAGNOSIS — I5022 Chronic systolic (congestive) heart failure: Secondary | ICD-10-CM | POA: Diagnosis not present

## 2019-02-01 DIAGNOSIS — E875 Hyperkalemia: Secondary | ICD-10-CM | POA: Diagnosis not present

## 2019-02-01 DIAGNOSIS — E785 Hyperlipidemia, unspecified: Secondary | ICD-10-CM | POA: Diagnosis not present

## 2019-02-06 ENCOUNTER — Ambulatory Visit (INDEPENDENT_AMBULATORY_CARE_PROVIDER_SITE_OTHER): Payer: Medicare Other | Admitting: *Deleted

## 2019-02-06 ENCOUNTER — Telehealth: Payer: Self-pay | Admitting: *Deleted

## 2019-02-06 ENCOUNTER — Other Ambulatory Visit: Payer: Self-pay

## 2019-02-06 DIAGNOSIS — I4891 Unspecified atrial fibrillation: Secondary | ICD-10-CM

## 2019-02-06 DIAGNOSIS — Z5181 Encounter for therapeutic drug level monitoring: Secondary | ICD-10-CM

## 2019-02-06 DIAGNOSIS — R7303 Prediabetes: Secondary | ICD-10-CM | POA: Diagnosis not present

## 2019-02-06 DIAGNOSIS — I5022 Chronic systolic (congestive) heart failure: Secondary | ICD-10-CM | POA: Diagnosis not present

## 2019-02-06 DIAGNOSIS — N183 Chronic kidney disease, stage 3 (moderate): Secondary | ICD-10-CM | POA: Diagnosis not present

## 2019-02-06 LAB — POCT INR: INR: 2.2 (ref 2.0–3.0)

## 2019-02-06 NOTE — Telephone Encounter (Signed)

## 2019-02-06 NOTE — Patient Instructions (Signed)
Continue coumadin 1/2 tablet daily except 1 tablet on Mondays, Wednesdays and Fridays Recheck in 6 weeks

## 2019-03-07 ENCOUNTER — Telehealth: Payer: Self-pay | Admitting: Cardiology

## 2019-03-07 NOTE — Telephone Encounter (Signed)
Virtual Visit Pre-Appointment Phone Call  "(Name), I am calling you today to discuss your upcoming appointment. We are currently trying to limit exposure to the virus that causes COVID-19 by seeing patients at home rather than in the office."  1. "What is the BEST phone number to call the day of the visit?" - include this in appointment notes  2. Do you have or have access to (through a family member/friend) a smartphone with video capability that we can use for your visit?" a. If yes - list this number in appt notes as cell (if different from BEST phone #) and list the appointment type as a VIDEO visit in appointment notes b. If no - list the appointment type as a PHONE visit in appointment notes  3. Confirm consent - "In the setting of the current Covid19 crisis, you are scheduled for a (phone or video) visit with your provider on (date) at (time).  Just as we do with many in-office visits, in order for you to participate in this visit, we must obtain consent.  If you'd like, I can send this to your mychart (if signed up) or email for you to review.  Otherwise, I can obtain your verbal consent now.  All virtual visits are billed to your insurance company just like a normal visit would be.  By agreeing to a virtual visit, we'd like you to understand that the technology does not allow for your provider to perform an examination, and thus may limit your provider's ability to fully assess your condition. If your provider identifies any concerns that need to be evaluated in person, we will make arrangements to do so.  Finally, though the technology is pretty good, we cannot assure that it will always work on either your or our end, and in the setting of a video visit, we may have to convert it to a phone-only visit.  In either situation, we cannot ensure that we have a secure connection.  Are you willing to proceed?" STAFF: Did the patient verbally acknowledge consent to telehealth visit? Document  YES/NO here: YES  4. Advise patient to be prepared - "Two hours prior to your appointment, go ahead and check your blood pressure, pulse, oxygen saturation, and your weight (if you have the equipment to check those) and write them all down. When your visit starts, your provider will ask you for this information. If you have an Apple Watch or Kardia device, please plan to have heart rate information ready on the day of your appointment. Please have a pen and paper handy nearby the day of the visit as well."  5. Give patient instructions for MyChart download to smartphone OR Doximity/Doxy.me as below if video visit (depending on what platform provider is using)  6. Inform patient they will receive a phone call 15 minutes prior to their appointment time (may be from unknown caller ID) so they should be prepared to answer    TELEPHONE CALL NOTE  Joshua [name] has been deemed a candidate for a follow-up tele-health visit to limit community exposure during the Covid-19 pandemic. I spoke with the patient via phone to ensure availability of phone/video source, confirm preferred email & phone number, and discuss instructions and expectations.  I reminded Joshua [name] to be prepared with any vital sign and/or heart rhythm information that could potentially be obtained via home monitoring, at the time of his visit. I reminded Joshua [name] to expect a phone call prior to  his visit.  Joshua [name] 03/07/2019 10:23 AM   INSTRUCTIONS FOR DOWNLOADING THE MYCHART APP TO SMARTPHONE  - The patient must first make sure to have activated MyChart and know their login information - If Apple, go to [employer] and type in MyChart in the search bar and download the app. If Android, ask patient to go to [employer] and type in [place] in the search bar and download the app. The app is free but as with any other app downloads, their phone may require them to verify saved payment information or  Apple/Android password.  - The patient will need to then log into the app with their MyChart username and password, and select [facility] as their healthcare provider to link the account. When it is time for your visit, go to the MyChart app, find appointments, and click Begin Video Visit. Be sure to Select Allow for your device to access the Microphone and Camera for your visit. You will then be connected, and your provider will be with you shortly.  **If they have any issues connecting, or need assistance please contact MyChart service desk [phone])**  **If using a computer, in order to ensure the best quality for their visit they will need to use either of the following Internet Browsers: [employer], or Google Chrome**  IF USING DOXIMITY or DOXY.ME - The patient will receive a link just prior to their visit by text.     FULL LENGTH CONSENT FOR TELE-HEALTH VISIT   I hereby voluntarily request, consent and authorize [facility] and its employed or contracted physicians, physician assistants, nurse practitioners or other licensed health care professionals (the Practitioner), to provide me with telemedicine health care services (the Services") as deemed necessary by the treating Practitioner. I acknowledge and consent to receive the Services by the Practitioner via telemedicine. I understand that the telemedicine visit will involve communicating with the Practitioner through live audiovisual communication technology and the disclosure of certain medical information by electronic transmission. I acknowledge that I have been given the opportunity to request an in-person assessment or other available alternative prior to the telemedicine visit and am voluntarily participating in the telemedicine visit.  I understand that I have the right to withhold or withdraw my consent to the use of telemedicine in the course of my care at any time, without affecting my right to future care  or treatment, and that the Practitioner or I may terminate the telemedicine visit at any time. I understand that I have the right to inspect all information obtained and/or recorded in the course of the telemedicine visit and may receive copies of available information for a reasonable fee.  I understand that some of the potential risks of receiving the Services via telemedicine include:   Delay or interruption in medical evaluation due to technological equipment failure or disruption;  Information transmitted may not be sufficient (e.g. poor resolution of images) to allow for appropriate medical decision making by the Practitioner; and/or   In rare instances, security protocols could fail, causing a breach of personal health information.  Furthermore, I acknowledge that it is my responsibility to provide information about my medical history, conditions and care that is complete and accurate to the best of my ability. I acknowledge that Practitioner's advice, recommendations, and/or decision may be based on factors not within their control, such as incomplete or inaccurate data provided by me or distortions of diagnostic images or specimens that may result from electronic transmissions. I  understand that the practice of medicine is not an exact science and that Practitioner makes no warranties or guarantees regarding treatment outcomes. I acknowledge that I will receive a copy of this consent concurrently upon execution via email to the email address I last provided but may also request a printed copy by calling the office of [facility].    I understand that my insurance will be billed for this visit.   I have read or had this consent read to me.  I understand the contents of this consent, which adequately explains the benefits and risks of the Services being provided via telemedicine.   I have been provided ample opportunity to ask questions regarding this consent and the Services and have had  my questions answered to my satisfaction.  I give my informed consent for the services to be provided through the use of telemedicine in my medical care  By participating in this telemedicine visit I agree to the above.

## 2019-03-11 ENCOUNTER — Telehealth (INDEPENDENT_AMBULATORY_CARE_PROVIDER_SITE_OTHER): Payer: Medicare Other | Admitting: Cardiology

## 2019-03-11 ENCOUNTER — Encounter: Payer: Self-pay | Admitting: Cardiology

## 2019-03-11 VITALS — Ht 70.0 in | Wt 242.0 lb

## 2019-03-11 DIAGNOSIS — E782 Mixed hyperlipidemia: Secondary | ICD-10-CM

## 2019-03-11 DIAGNOSIS — I4891 Unspecified atrial fibrillation: Secondary | ICD-10-CM

## 2019-03-11 DIAGNOSIS — I10 Essential (primary) hypertension: Secondary | ICD-10-CM

## 2019-03-11 DIAGNOSIS — I5022 Chronic systolic (congestive) heart failure: Secondary | ICD-10-CM

## 2019-03-11 NOTE — Progress Notes (Signed)
Virtual Visit via Telephone Note   This visit type was conducted due to national recommendations for restrictions regarding the COVID-19 Pandemic (e.g. social distancing) in an effort to limit this patient's exposure and mitigate transmission in our community.  Due to his co-morbid illnesses, this patient is at least at moderate risk for complications without adequate follow up.  This format is felt to be most appropriate for this patient at this time.  The patient did not have access to video technology/had technical difficulties with video requiring transitioning to audio format only (telephone).  All issues noted in this document were discussed and addressed.  No physical exam could be performed with this format.  Please refer to the patient's chart for his  consent to telehealth for [facility].   Date:  03/11/2019   ID:  Joshua [name], DOB 12-01-1940, MRN [id number]  Patient Location: Home Provider Location: Home  PCP:  Asencion Noble, MD  Cardiologist:  Carlyle Dolly, MD  Electrophysiologist:  None   Evaluation Performed:  Follow-Up Visit  Chief Complaint:  4 month follow up  History of Present Illness:    Joshua [name] is a 78 y.o. male seen today for follow up of the following medical problems.   1. Afib - he failed extended course of amiodarone along with prior DCCV 05/30/16. Have been working toward rate control strategy - off dilt due to low LVEF. Remains on coreg.    - no recent palpitations - compliant with meds. No bleeding on coumadin.    2. Hyperlipidemia - was previously on prava, developed muscle aches. Stopped taking approx December, symptoms resolved. - reports he has tried multiple other statins with Dr Willey Blade -he is on zetiacurrently and tolerating.  -labs followed by pcp, compliant with zetia  3. HTN - compliant with meds  4. Chronic systolic HF - echo 05/4695 LVEF 30-35%, severe basal infeiror hypokinesis. Cannot eval diasotlic  function.  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    Past Medical History:  Diagnosis Date  . 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 BPH (benign prostatic hyperplasia)   . Chronic anticoagulation   . Chronic systolic dysfunction of left ventricle    EF 35%  . Degenerative joint disease    Right knee  . Hyperlipidemia   . Hypertension   . Longstanding persistent atrial fibrillation   . Nonischemic cardiomyopathy ([place])   . Obesity   . Sleep apnea    Rx-CPAP   Past Surgical History:  Procedure Laterality Date  . CARDIAC CATHETERIZATION N/A 03/04/2016   Procedure: Left Heart Cath and Coronary Angiography;  Surgeon: Troy Sine, MD;  Location: [facility];  Service: Cardiovascular;  Laterality: N/A;  . CARDIOVERSION N/A 05/30/2016   Procedure: CARDIOVERSION;  Surgeon: Arnoldo Lenis, MD;  Location: [facility];  Service: Endoscopy;  Laterality: N/A;  . CATARACT EXTRACTION W/PHACO Left 01/03/2017   Procedure: CATARACT EXTRACTION PHACO AND INTRAOCULAR LENS PLACEMENT (IOC);  Surgeon: Rutherford Guys, MD;  Location: [facility];  Service: Ophthalmology;  Laterality: Left;  CDE: 5.74  . COLONOSCOPY  03/10/2005  . TOTAL KNEE ARTHROPLASTY Right 01/14/2013  Procedure: TOTAL KNEE ARTHROPLASTY;  Surgeon: Gearlean Alf, MD;  Location: [facility];  Service: Orthopedics;  Laterality: Right;  . TOTAL KNEE ARTHROPLASTY Left 03/10/2014   Procedure: LEFT TOTAL KNEE ARTHROPLASTY;  Surgeon: Gearlean Alf, MD;  Location: [facility];  Service: Orthopedics;  Laterality: Left;  Marland Kitchen VENTRAL HERNIA REPAIR  03/2008, 02/2009     Current Meds  Medication Sig  . carvedilol  (COREG) 25 MG tablet TAKE 1 TABLET BY MOUTH TWICE DAILY  . digoxin (LANOXIN) 0.125 MG tablet TAKE 1 TABLET BY MOUTH ONCE DAILY  . diphenhydrAMINE (BENADRYL) 25 MG tablet Take 25 mg by mouth every 6 (six) hours as needed.  . ezetimibe (ZETIA) 10 MG tablet TAKE 1 TABLET BY MOUTH ONCE DAILY  . furosemide (LASIX) 40 MG tablet Take 1 tablet (40 mg total) by mouth every other day.  . lisinopril (PRINIVIL,ZESTRIL) 20 MG tablet Take 1 tablet by mouth twice daily  . warfarin (COUMADIN) 10 MG tablet TAKE 1/2 TABLET DAILY EXCEPT 1 TABLET ON MONDAY, WEDNESDAYS AND FRIDAYS     Allergies:   Patient has no known allergies.   Social History   Tobacco Use  . Smoking status: Former Smoker    Types: Cigars    Last attempt to quit: 02/21/2018    Years since quitting: 1.0  . Smokeless tobacco: Never Used  . Tobacco comment: Occasional cigar twice a year   Substance Use Topics  . Alcohol use: No    Alcohol/week: 0.0 standard drinks  . Drug use: No     Family Hx: The patient's family history includes Heart attack in his father.  ROS:   Please see the history of present illness.     All other systems reviewed and are negative.   Prior CV studies:   The following studies were reviewed today:  Echo 09/2010: LVEF 55%, mild LVH, mild MR, mod LAE.   02/2016 Echo Study Conclusions  - Left ventricle: The cavity size was moderately dilated. Wall  thickness was at the upper limits of normal. Systolic function  was moderately to severely reduced. The estimated ejection  fraction was in the range of 30% to 35%. Diffuse hypokinesis.  There is severe hypokinesis of the basalinferior myocardium. The  study is not technically sufficient to allow evaluation of LV  diastolic function. - Mitral valve: Mildly thickened leaflets . There was mild  regurgitation. - Left atrium: The atrium was severely dilated. - Right ventricle: The cavity size was mildly dilated. Systolic  function was mildly  reduced. - Right atrium: The atrium was severely dilated. - Atrial septum: No defect or patent foramen ovale was identified. - Tricuspid valve: There was trivial regurgitation. Peak RV-RA  gradient (S): 26 mm Hg. - Pericardium, extracardiac: There was no pericardial effusion.  Impressions:  - Upper normal LV wall thickness with moderate chamber dilatation  and LVEF approximately 30-35% in the setting of atrial  fibrillation. LVEF decreased compared to previous study in 2011.  Diffuse hypokinesis, most prominent in the inferior wall.  Indeterminate diastolic function. Severe biatrial enlargement.  Mildly thickened mitral leaflets with mild mitral regurgitation.  Mildly dilated RV with reduced contraction. Trivial tricuspid  regurgitation, RV-RA gradient 26 mmHg. Unable to assess CVP.  02/2016 Cath  Prox LAD lesion, 10% stenosed.  Mid LAD lesion, 20% stenosed.  Ost 3rd Diag lesion, 40% stenosed.  Prox RCA lesion, 20% stenosed.   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 Left ventriculography was not done.   RECOMMENDATION: Medical therapy for his cardiomyopathy with improved rate control of his atrial fibrillation.    Labs/Other Tests and Data Reviewed:    EKG:  No ECG reviewed.  Recent Labs: No results found for requested labs within last 8760 hours.   Recent Lipid Panel Lab Results  Component Value Date/Time   CHOL 187 11/28/2012 08:30 AM   TRIG 286 (H) 11/28/2012 08:30 AM   HDL 30 (L) 11/28/2012 08:30 AM   CHOLHDL 6.2 11/28/2012 08:30 AM   LDLCALC 100 (H) 11/28/2012 08:30 AM    Wt Readings from Last 3 Encounters:  03/11/19 242 lb (109.8 kg)  11/05/18 242 lb 9.6 oz (110 kg)  07/10/18 230 lb (104.3 kg)     Objective:    Vital Signs:  Ht 5\' 10"  (1.778 m)   Wt 242 lb  (109.8 kg)   BMI 34.72 kg/m    Normal affect. Normal speech pattern and tone. Comfortable, no apparent distress. No audible signs of SOB or wheezing.   ASSESSMENT & PLAN:    1. Afib - no symptoms. Continue rate control, continue anticoag  2. Hyperlipidemia - intolerant to statins - continue zetia, labs followed by pcp  3. HTN - continue current meds  4. Chronic systolic HF -no symptoms. Has not been intersted in entresto. Aldactone stopped prevoiusly due to hyperkalemia - consider repeat echo over next few visits to reevaluate LVEF.   COVID-19 Education: The signs and symptoms of COVID-19 were discussed with the patient and how to seek care for testing (follow up with PCP or arrange E-visit).  The importance of social distancing was discussed today.  Time:   Today, I have spent 15 minutes with the patient with telehealth technology discussing the above problems.     Medication Adjustments/Labs and Tests Ordered: Current medicines are reviewed at length with the patient today.  Concerns regarding medicines are outlined above.   Tests Ordered: No orders of the defined types were placed in this encounter.   Medication Changes: No orders of the defined types were placed in this encounter.   Disposition:  Follow up 4 months  Signed, Carlyle Dolly, MD  03/11/2019 9:05 AM    [facility]

## 2019-03-11 NOTE — Patient Instructions (Addendum)
Medication Instructions:   Your physician recommends that you continue on your current medications as directed. Please refer to the Current Medication list given to you today.  Labwork:  NONE  Testing/Procedures:  NONE  Follow-Up:  Your physician recommends that you schedule a follow-up appointment in: 4 months.  Any Other Special Instructions Will Be Listed Below (If Applicable).  If you need a refill on your cardiac medications before your next appointment, please call your pharmacy. 

## 2019-03-12 ENCOUNTER — Encounter (HOSPITAL_COMMUNITY): Payer: Self-pay | Admitting: Emergency Medicine

## 2019-03-12 ENCOUNTER — Emergency Department (HOSPITAL_COMMUNITY)
Admission: EM | Admit: 2019-03-12 | Discharge: 2019-03-12 | Disposition: A | Discharge status: Home / Self Care | Payer: Medicare Other | Attending: Emergency Medicine | Admitting: Emergency Medicine

## 2019-03-12 ENCOUNTER — Other Ambulatory Visit: Payer: Self-pay

## 2019-03-12 DIAGNOSIS — I2510 Atherosclerotic heart disease of native coronary artery without angina pectoris: Secondary | ICD-10-CM | POA: Insufficient documentation

## 2019-03-12 DIAGNOSIS — Y998 Other external cause status: Secondary | ICD-10-CM | POA: Insufficient documentation

## 2019-03-12 DIAGNOSIS — Z23 Encounter for immunization: Secondary | ICD-10-CM | POA: Insufficient documentation

## 2019-03-12 DIAGNOSIS — Z96652 Presence of left artificial knee joint: Secondary | ICD-10-CM | POA: Diagnosis not present

## 2019-03-12 DIAGNOSIS — Z79899 Other long term (current) drug therapy: Secondary | ICD-10-CM | POA: Insufficient documentation

## 2019-03-12 DIAGNOSIS — Z87891 Personal history of nicotine dependence: Secondary | ICD-10-CM | POA: Insufficient documentation

## 2019-03-12 DIAGNOSIS — Y9389 Activity, other specified: Secondary | ICD-10-CM | POA: Diagnosis not present

## 2019-03-12 DIAGNOSIS — Y92814 Boat as the place of occurrence of the external cause: Secondary | ICD-10-CM | POA: Diagnosis not present

## 2019-03-12 DIAGNOSIS — I5022 Chronic systolic (congestive) heart failure: Secondary | ICD-10-CM | POA: Insufficient documentation

## 2019-03-12 DIAGNOSIS — Z7901 Long term (current) use of anticoagulants: Secondary | ICD-10-CM | POA: Diagnosis not present

## 2019-03-12 DIAGNOSIS — S81812A Laceration without foreign body, left lower leg, initial encounter: Secondary | ICD-10-CM | POA: Insufficient documentation

## 2019-03-12 DIAGNOSIS — W01198A Fall on same level from slipping, tripping and stumbling with subsequent striking against other object, initial encounter: Secondary | ICD-10-CM | POA: Insufficient documentation

## 2019-03-12 DIAGNOSIS — I110 Hypertensive heart disease with heart failure: Secondary | ICD-10-CM | POA: Insufficient documentation

## 2019-03-12 DIAGNOSIS — Z96651 Presence of right artificial knee joint: Secondary | ICD-10-CM | POA: Diagnosis not present

## 2019-03-12 DIAGNOSIS — S8992XA Unspecified injury of left lower leg, initial encounter: Secondary | ICD-10-CM | POA: Diagnosis present

## 2019-03-12 MED ORDER — TETANUS-DIPHTH-ACELL PERTUSSIS 5-2.5-18.5 LF-MCG/0.5 IM SUSP
0.5000 mL | Freq: Once | INTRAMUSCULAR | Status: AC
  Administered 2019-03-12: 0.5 mL via INTRAMUSCULAR
  Filled 2019-03-12: qty 0.5

## 2019-03-12 NOTE — ED Provider Notes (Signed)
  Face-to-face evaluation   History: Patient injured his left leg, on a boat trailer when he slipped.  Injuries isolated left shin.  He denies weakness or dizziness.  He is able to walk.  He was at work, looking for a river, when this happened.  Physical exam: Wound left leg examined post closure.  Proximal based flap laceration, scafng at that tip.  No associated tenderness, fluctuance or crepitation.  Medical screening examination/treatment/procedure(s) were conducted as a shared visit with non-physician practitioner(s) and myself.  I personally evaluated the patient during the encounter    Daleen Bo, MD 03/13/19 1134

## 2019-03-12 NOTE — Discharge Instructions (Addendum)
Return if any problems.
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
## 2019-03-20 ENCOUNTER — Other Ambulatory Visit: Payer: Self-pay | Admitting: Cardiology

## 2019-03-25 ENCOUNTER — Ambulatory Visit (INDEPENDENT_AMBULATORY_CARE_PROVIDER_SITE_OTHER): Payer: Medicare Other | Admitting: *Deleted

## 2019-03-25 ENCOUNTER — Other Ambulatory Visit: Payer: Self-pay

## 2019-03-25 DIAGNOSIS — Z5181 Encounter for therapeutic drug level monitoring: Secondary | ICD-10-CM | POA: Diagnosis not present

## 2019-03-25 DIAGNOSIS — I4891 Unspecified atrial fibrillation: Secondary | ICD-10-CM

## 2019-03-25 LAB — POCT INR: INR: 2 (ref 2.0–3.0)

## 2019-03-25 NOTE — Patient Instructions (Signed)
Continue coumadin 1/2 tablet daily except 1 tablet on Mondays, Wednesdays and Fridays Recheck in 6 weeks

## 2019-04-03 ENCOUNTER — Other Ambulatory Visit: Payer: Self-pay | Admitting: Cardiology

## 2019-05-03 DIAGNOSIS — R7303 Prediabetes: Secondary | ICD-10-CM | POA: Diagnosis not present

## 2019-05-03 DIAGNOSIS — Z79899 Other long term (current) drug therapy: Secondary | ICD-10-CM | POA: Diagnosis not present

## 2019-05-03 DIAGNOSIS — N183 Chronic kidney disease, stage 3 (moderate): Secondary | ICD-10-CM | POA: Diagnosis not present

## 2019-05-03 DIAGNOSIS — I5022 Chronic systolic (congestive) heart failure: Secondary | ICD-10-CM | POA: Diagnosis not present

## 2019-05-06 ENCOUNTER — Ambulatory Visit (INDEPENDENT_AMBULATORY_CARE_PROVIDER_SITE_OTHER): Payer: Medicare Other | Admitting: *Deleted

## 2019-05-06 DIAGNOSIS — I4891 Unspecified atrial fibrillation: Secondary | ICD-10-CM | POA: Diagnosis not present

## 2019-05-06 DIAGNOSIS — Z5181 Encounter for therapeutic drug level monitoring: Secondary | ICD-10-CM

## 2019-05-06 LAB — POCT INR: INR: 2.4 (ref 2.0–3.0)

## 2019-05-06 NOTE — Patient Instructions (Signed)
Continue coumadin 1/2 tablet daily except 1 tablet on Mondays, Wednesdays and Fridays Recheck in 6 weeks

## 2019-05-10 DIAGNOSIS — E1122 Type 2 diabetes mellitus with diabetic chronic kidney disease: Secondary | ICD-10-CM | POA: Diagnosis not present

## 2019-05-10 DIAGNOSIS — D696 Thrombocytopenia, unspecified: Secondary | ICD-10-CM | POA: Diagnosis not present

## 2019-05-10 DIAGNOSIS — I5022 Chronic systolic (congestive) heart failure: Secondary | ICD-10-CM | POA: Diagnosis not present

## 2019-05-10 DIAGNOSIS — N183 Chronic kidney disease, stage 3 (moderate): Secondary | ICD-10-CM | POA: Diagnosis not present

## 2019-06-13 ENCOUNTER — Ambulatory Visit (INDEPENDENT_AMBULATORY_CARE_PROVIDER_SITE_OTHER): Payer: Medicare Other | Admitting: *Deleted

## 2019-06-13 ENCOUNTER — Other Ambulatory Visit: Payer: Self-pay

## 2019-06-13 DIAGNOSIS — Z5181 Encounter for therapeutic drug level monitoring: Secondary | ICD-10-CM

## 2019-06-13 DIAGNOSIS — I4891 Unspecified atrial fibrillation: Secondary | ICD-10-CM

## 2019-06-13 LAB — POCT INR: INR: 2 (ref 2.0–3.0)

## 2019-06-13 NOTE — Patient Instructions (Addendum)
Description   Continue coumadin 1/2 tablet daily except 1 tablet on Mondays, Wednesdays and Fridays Recheck in 5 weeks with MD appt.

## 2019-06-20 ENCOUNTER — Other Ambulatory Visit: Payer: Self-pay | Admitting: Cardiology

## 2019-06-24 ENCOUNTER — Other Ambulatory Visit: Payer: Self-pay

## 2019-06-24 MED ORDER — FUROSEMIDE 40 MG PO TABS: 40.0000 mg | ORAL_TABLET | ORAL | 0 refills | Status: DC

## 2019-07-17 ENCOUNTER — Ambulatory Visit (INDEPENDENT_AMBULATORY_CARE_PROVIDER_SITE_OTHER): Payer: Medicare Other | Admitting: Cardiology

## 2019-07-17 ENCOUNTER — Ambulatory Visit (INDEPENDENT_AMBULATORY_CARE_PROVIDER_SITE_OTHER): Payer: Medicare Other | Admitting: *Deleted

## 2019-07-17 ENCOUNTER — Encounter: Payer: Self-pay | Admitting: *Deleted

## 2019-07-17 ENCOUNTER — Encounter: Payer: Self-pay | Admitting: Cardiology

## 2019-07-17 ENCOUNTER — Other Ambulatory Visit: Payer: Self-pay

## 2019-07-17 VITALS — BP 141/92 | HR 84 | Ht 70.0 in | Wt 246.3 lb

## 2019-07-17 DIAGNOSIS — I4891 Unspecified atrial fibrillation: Secondary | ICD-10-CM | POA: Diagnosis not present

## 2019-07-17 DIAGNOSIS — Z5181 Encounter for therapeutic drug level monitoring: Secondary | ICD-10-CM | POA: Diagnosis not present

## 2019-07-17 DIAGNOSIS — I5022 Chronic systolic (congestive) heart failure: Secondary | ICD-10-CM | POA: Diagnosis not present

## 2019-07-17 DIAGNOSIS — I10 Essential (primary) hypertension: Secondary | ICD-10-CM | POA: Diagnosis not present

## 2019-07-17 DIAGNOSIS — Z23 Encounter for immunization: Secondary | ICD-10-CM

## 2019-07-17 DIAGNOSIS — E782 Mixed hyperlipidemia: Secondary | ICD-10-CM

## 2019-07-17 LAB — POCT INR: INR: 2.4 (ref 2.0–3.0)

## 2019-07-17 NOTE — Patient Instructions (Signed)
Your physician recommends that you schedule a follow-up appointment in: 4 MONTHS WITH DR BRANCH  Your physician recommends that you continue on your current medications as directed. Please refer to the Current Medication list given to you today.  Your physician has requested that you have an echocardiogram. Echocardiography is a painless test that uses sound waves to create images of your heart. It provides your doctor with information about the size and shape of your heart and how well your heart's chambers and valves are working. This procedure takes approximately one hour. There are no restrictions for this procedure.  Thank you for choosing [facility]!!    

## 2019-07-17 NOTE — Addendum Note (Signed)
Addended by: Julian Hy T on: 07/17/2019 11:45 AM   Modules accepted: Orders

## 2019-07-17 NOTE — Patient Instructions (Signed)
Continue coumadin 1/2 tablet daily except 1 tablet on Mondays, Wednesdays and Fridays Recheck in 6 weeks 

## 2019-07-17 NOTE — Progress Notes (Signed)
Clinical Summary Mr. Cienfuegos is a 78 y.o.male seen today for follow up of the following medical problems.   1. Afib - he failed extended course of amiodarone along with prior DCCV 05/30/16. Have been working toward rate control strategy - off dilt due to low LVEF. Remains on coreg.     - no recent palpitations - compliant with meds. No recent bleeding on coumadin. - not interested in NOACs due to cost.   2. Hyperlipidemia - was previously on prava, developed muscle aches. Stopped taking approx December, symptoms resolved. - reports he has tried multiple other statins with Dr Willey Blade -he is on zetiacurrently and tolerating.  - compliant with zetia, labs followed by pcp  3. HTN - he is compliant with meds     4. Chronic systolic HF - echo 123456 LVEF 30-35%, severe basal infeiror hypokinesis. Cannot eval diasotlic function.  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 BPH (benign prostatic hyperplasia)   . Chronic anticoagulation   . Chronic systolic dysfunction of left ventricle    EF 35%  . Degenerative joint disease    Right knee  . Hyperlipidemia   . Hypertension   . Longstanding persistent atrial fibrillation   . Nonischemic cardiomyopathy ([place])   . Obesity   . Sleep apnea    Rx-CPAP     No Known Allergies   Current Outpatient Medications  Medication Sig Dispense Refill  . carvedilol (COREG) 25 MG tablet Take 1 tablet by mouth twice daily 180 tablet 3  . digoxin (LANOXIN) 0.125 MG tablet TAKE 1 TABLET BY MOUTH ONCE DAILY 90 tablet 3  . diphenhydrAMINE (BENADRYL) 25 MG tablet Take 25 mg by mouth every 6 (six) hours as needed.    . ezetimibe  (ZETIA) 10 MG tablet TAKE 1 TABLET BY MOUTH ONCE DAILY 90 tablet 3  . furosemide (LASIX) 40 MG tablet Take 1 tablet (40 mg total) by mouth every other day. 40 tablet 0  . lisinopril (ZESTRIL) 20 MG tablet Take 1 tablet by mouth twice daily 180 tablet 3  . warfarin (COUMADIN) 10 MG tablet TAKE 1/2 TABLET DAILY EXCEPT 1 TABLET ON MONDAY, WEDNESDAYS AND FRIDAYS 45 tablet 6   No current facility-administered medications for this visit.      Past Surgical History:  Procedure Laterality Date  . CARDIAC CATHETERIZATION N/A 03/04/2016   Procedure: Left Heart Cath and Coronary Angiography;  Surgeon: Troy Sine, MD;  Location: [facility];  Service: Cardiovascular;  Laterality: N/A;  . CARDIOVERSION N/A 05/30/2016   Procedure: CARDIOVERSION;  Surgeon: Arnoldo Lenis, MD;  Location: [facility];  Service: Endoscopy;  Laterality: N/A;  . CATARACT EXTRACTION W/PHACO Left 01/03/2017   Procedure: CATARACT EXTRACTION PHACO AND INTRAOCULAR LENS PLACEMENT (IOC);  Surgeon: Rutherford Guys, MD;  Location: [facility];  Service: Ophthalmology;  Laterality: Left;  CDE: 5.74  . COLONOSCOPY  03/10/2005  . 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 TOTAL KNEE ARTHROPLASTY Left 03/10/2014   Procedure: LEFT TOTAL KNEE ARTHROPLASTY;  Surgeon: Dione Plover  Aluisio, MD;  Location[facility];  Service: Orthopedics;  Laterality: Left;  Marland Kitchen VENTRAL HERNIA REPAIR  03/2008, 02/2009     No Known Allergies    Family History  Problem Relation Age of Onset  . Heart attack Father      Social History Mr. Mcmillion reports that he quit smoking about 16 months ago. His smoking use included cigars. He has never used smokeless tobacco. Mr. Marcinko reports no history of alcohol use.   Review of Systems CONSTITUTIONAL: No weight loss, fever, chills, weakness or fatigue.  HEENT: Eyes: No visual loss,  blurred vision, double vision or yellow sclerae.No hearing loss, sneezing, congestion, runny nose or sore throat.  SKIN: No rash or itching.  CARDIOVASCULAR: per hpi RESPIRATORY:per hpi GASTROINTESTINAL: No anorexia, nausea, vomiting or diarrhea. No abdominal pain or blood.  GENITOURINARY: No burning on urination, no polyuria NEUROLOGICAL: No headache, dizziness, syncope, paralysis, ataxia, numbness or tingling in the extremities. No change in bowel or bladder control.  MUSCULOSKELETAL: No muscle, back pain, joint pain or stiffness.  LYMPHATICS: No enlarged nodes. No history of splenectomy.  PSYCHIATRIC: No history of depression or anxiety.  ENDOCRINOLOGIC: No reports of sweating, cold or heat intolerance. No polyuria or polydipsia.  Marland Kitchen   Physical Examination Today's Vitals   07/17/19 1056  BP: (!) 141/92  Pulse: 84  SpO2: 97%  Weight: 246 lb 4.8 oz (111.7 kg)  Height: 5\' 10"  (1.778 m)   Body mass index is 35.34 kg/m.  Gen: resting comfortably, no acute distress HEENT: no scleral icterus, pupils equal round and reactive, no palptable cervical adenopathy,  CV: RRR, no m/r/g, no jvd Resp: Clear to auscultation bilaterally GI: abdomen is soft, non-tender, non-distended, normal bowel sounds, no hepatosplenomegaly MSK: extremities are warm, no edema.  Skin: warm, no rash Neuro:  no focal deficits Psych: appropriate affect   Diagnostic Studies Echo 09/2010: LVEF 55%, mild LVH, mild MR, mod LAE.   02/2016 Echo Study Conclusions  - Left ventricle: The cavity size was moderately dilated. Wall  thickness was at the upper limits of normal. Systolic function  was moderately to severely reduced. The estimated ejection  fraction was in the range of 30% to 35%. Diffuse hypokinesis.  There is severe hypokinesis of the basalinferior myocardium. The  study is not technically sufficient to allow evaluation of LV  diastolic function. - Mitral valve: Mildly thickened leaflets .  There was mild  regurgitation. - Left atrium: The atrium was severely dilated. - Right ventricle: The cavity size was mildly dilated. Systolic  function was mildly reduced. - Right atrium: The atrium was severely dilated. - Atrial septum: No defect or patent foramen ovale was identified. - Tricuspid valve: There was trivial regurgitation. Peak RV-RA  gradient (S): 26 mm Hg. - Pericardium, extracardiac: There was no pericardial effusion.  Impressions:  - Upper normal LV wall thickness with moderate chamber dilatation  and LVEF approximately 30-35% in the setting of atrial  fibrillation. LVEF decreased compared to previous study in 2011.  Diffuse hypokinesis, most prominent in the inferior wall.  Indeterminate diastolic function. Severe biatrial enlargement.  Mildly thickened mitral leaflets with mild mitral regurgitation.  Mildly dilated RV with reduced contraction. Trivial tricuspid  regurgitation, RV-RA gradient 26 mmHg. Unable to assess CVP.  02/2016 Cath  Prox LAD lesion, 10% stenosed.  Mid LAD lesion, 20% stenosed.  Ost 3rd Diag lesion, 40% stenosed.  Prox RCA lesion, 20% stenosed.   Mild nonobstructive CAD with 20% proximal and mid LAD stenoses, 40% diagonal  3 stenoses, normal left circumflex coronary artery and small RCA with 20% proximal narrowing.  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 Left ventriculography was not done.   RECOMMENDATION: Medical therapy for his cardiomyopathy with improved rate control of his atrial fibrillation.    Assessment and Plan  1. Afib -no symptoms, continue current meds  2. Hyperlipidemia - intolerant to statins, has been on zetia - continue current therapy  3. HTN - manual recheck 130/80, continue current meds  4. Chronic systolic HF -no symptoms. Has not been intersted in entresto. Aldactone stopped prevoiusly due to hyperkalemia - repeat echo to reassess LVEF. Reconsider entresto if persistent  dysfunction   F/u 4 months   Arnoldo Lenis, M.D.

## 2019-08-07 DIAGNOSIS — I10 Essential (primary) hypertension: Secondary | ICD-10-CM | POA: Diagnosis not present

## 2019-08-07 DIAGNOSIS — E1129 Type 2 diabetes mellitus with other diabetic kidney complication: Secondary | ICD-10-CM | POA: Diagnosis not present

## 2019-08-07 DIAGNOSIS — Z79899 Other long term (current) drug therapy: Secondary | ICD-10-CM | POA: Diagnosis not present

## 2019-08-07 DIAGNOSIS — N183 Chronic kidney disease, stage 3 unspecified: Secondary | ICD-10-CM | POA: Diagnosis not present

## 2019-08-16 DIAGNOSIS — N183 Chronic kidney disease, stage 3 unspecified: Secondary | ICD-10-CM | POA: Diagnosis not present

## 2019-08-16 DIAGNOSIS — E1122 Type 2 diabetes mellitus with diabetic chronic kidney disease: Secondary | ICD-10-CM | POA: Diagnosis not present

## 2019-08-16 DIAGNOSIS — I5022 Chronic systolic (congestive) heart failure: Secondary | ICD-10-CM | POA: Diagnosis not present

## 2019-08-29 ENCOUNTER — Other Ambulatory Visit: Payer: Self-pay | Admitting: Cardiology

## 2019-09-04 ENCOUNTER — Ambulatory Visit (INDEPENDENT_AMBULATORY_CARE_PROVIDER_SITE_OTHER): Payer: Medicare Other

## 2019-09-04 ENCOUNTER — Ambulatory Visit (INDEPENDENT_AMBULATORY_CARE_PROVIDER_SITE_OTHER): Payer: Medicare Other | Admitting: *Deleted

## 2019-09-04 ENCOUNTER — Other Ambulatory Visit: Payer: Self-pay

## 2019-09-04 DIAGNOSIS — I4891 Unspecified atrial fibrillation: Secondary | ICD-10-CM | POA: Diagnosis not present

## 2019-09-04 DIAGNOSIS — Z5181 Encounter for therapeutic drug level monitoring: Secondary | ICD-10-CM

## 2019-09-04 DIAGNOSIS — I5022 Chronic systolic (congestive) heart failure: Secondary | ICD-10-CM | POA: Diagnosis not present

## 2019-09-04 LAB — POCT INR: INR: 3.4 — AB (ref 2.0–3.0)

## 2019-09-04 NOTE — Patient Instructions (Signed)
Description   Do not take any Warfarin today then continue coumadin 1/2 tablet daily except 1 tablet on Mondays, Wednesdays and Fridays. Recheck in 4 weeks.

## 2019-09-09 DIAGNOSIS — D225 Melanocytic nevi of trunk: Secondary | ICD-10-CM | POA: Diagnosis not present

## 2019-09-09 DIAGNOSIS — D485 Neoplasm of uncertain behavior of skin: Secondary | ICD-10-CM | POA: Diagnosis not present

## 2019-09-09 DIAGNOSIS — D1801 Hemangioma of skin and subcutaneous tissue: Secondary | ICD-10-CM | POA: Diagnosis not present

## 2019-09-09 DIAGNOSIS — L821 Other seborrheic keratosis: Secondary | ICD-10-CM | POA: Diagnosis not present

## 2019-09-09 DIAGNOSIS — Z85820 Personal history of malignant melanoma of skin: Secondary | ICD-10-CM | POA: Diagnosis not present

## 2019-09-09 DIAGNOSIS — B079 Viral wart, unspecified: Secondary | ICD-10-CM | POA: Diagnosis not present

## 2019-09-10 ENCOUNTER — Telehealth: Payer: Self-pay | Admitting: *Deleted

## 2019-09-10 NOTE — Telephone Encounter (Signed)
-----   Message from Arnoldo Lenis, MD sent at 09/10/2019  8:45 AM EST ----- Heart function mildly improved to 40-45% (normal is 50-60%). Continue current meds for now, may discuss further some changes at our next f/u   J BrancH MD

## 2019-09-10 NOTE — Telephone Encounter (Signed)
Pt voiced understanding - routed to pcp  

## 2019-09-20 ENCOUNTER — Other Ambulatory Visit: Payer: Self-pay | Admitting: Cardiology

## 2019-09-25 ENCOUNTER — Other Ambulatory Visit: Payer: Self-pay

## 2019-09-25 ENCOUNTER — Ambulatory Visit (INDEPENDENT_AMBULATORY_CARE_PROVIDER_SITE_OTHER): Payer: Medicare Other | Admitting: *Deleted

## 2019-09-25 DIAGNOSIS — I4891 Unspecified atrial fibrillation: Secondary | ICD-10-CM | POA: Diagnosis not present

## 2019-09-25 DIAGNOSIS — Z5181 Encounter for therapeutic drug level monitoring: Secondary | ICD-10-CM

## 2019-09-25 LAB — POCT INR: INR: 2.1 (ref 2.0–3.0)

## 2019-09-25 NOTE — Patient Instructions (Signed)
Continue coumadin 1/2 tablet daily except 1 tablet on Mondays, Wednesdays and Fridays Recheck in 7 weeks 

## 2019-10-06 ENCOUNTER — Other Ambulatory Visit: Payer: Self-pay | Admitting: Cardiology

## 2019-10-09 ENCOUNTER — Other Ambulatory Visit: Payer: Self-pay | Admitting: Cardiology

## 2019-11-08 ENCOUNTER — Ambulatory Visit: Payer: Medicare Other | Admitting: Cardiology

## 2019-11-15 DIAGNOSIS — I5022 Chronic systolic (congestive) heart failure: Secondary | ICD-10-CM | POA: Diagnosis not present

## 2019-11-15 DIAGNOSIS — N183 Chronic kidney disease, stage 3 unspecified: Secondary | ICD-10-CM | POA: Diagnosis not present

## 2019-11-15 DIAGNOSIS — Z79899 Other long term (current) drug therapy: Secondary | ICD-10-CM | POA: Diagnosis not present

## 2019-11-15 DIAGNOSIS — I10 Essential (primary) hypertension: Secondary | ICD-10-CM | POA: Diagnosis not present

## 2019-11-15 DIAGNOSIS — E1129 Type 2 diabetes mellitus with other diabetic kidney complication: Secondary | ICD-10-CM | POA: Diagnosis not present

## 2019-11-18 ENCOUNTER — Encounter: Payer: Self-pay | Admitting: Cardiology

## 2019-11-18 ENCOUNTER — Ambulatory Visit (INDEPENDENT_AMBULATORY_CARE_PROVIDER_SITE_OTHER): Payer: Medicare Other | Admitting: *Deleted

## 2019-11-18 ENCOUNTER — Ambulatory Visit (INDEPENDENT_AMBULATORY_CARE_PROVIDER_SITE_OTHER): Payer: Medicare Other | Admitting: Cardiology

## 2019-11-18 ENCOUNTER — Encounter: Payer: Self-pay | Admitting: *Deleted

## 2019-11-18 ENCOUNTER — Other Ambulatory Visit: Payer: Self-pay

## 2019-11-18 VITALS — BP 138/80 | HR 89 | Ht 70.0 in | Wt 242.0 lb

## 2019-11-18 DIAGNOSIS — I5022 Chronic systolic (congestive) heart failure: Secondary | ICD-10-CM

## 2019-11-18 DIAGNOSIS — Z5181 Encounter for therapeutic drug level monitoring: Secondary | ICD-10-CM

## 2019-11-18 DIAGNOSIS — I10 Essential (primary) hypertension: Secondary | ICD-10-CM

## 2019-11-18 DIAGNOSIS — E782 Mixed hyperlipidemia: Secondary | ICD-10-CM | POA: Diagnosis not present

## 2019-11-18 DIAGNOSIS — I4891 Unspecified atrial fibrillation: Secondary | ICD-10-CM | POA: Diagnosis not present

## 2019-11-18 LAB — POCT INR: INR: 2.1 (ref 2.0–3.0)

## 2019-11-18 MED ORDER — FUROSEMIDE 40 MG PO TABS: ORAL_TABLET | ORAL | 3 refills | Status: DC

## 2019-11-18 NOTE — Patient Instructions (Addendum)
Medication Instructions:   Begin Delene Loll 24/26mg  twice a day - will check cost & let you know.   Lasix refilled today.   Continue all other medications.    Labwork: none  Testing/Procedures: none  Follow-Up: 4 months   Any Other Special Instructions Will Be Listed Below (If Applicable).  If you need a refill on your cardiac medications before your next appointment, please call your pharmacy.

## 2019-11-18 NOTE — Patient Instructions (Signed)
Continue coumadin 1/2 tablet daily except 1 tablet on Mondays, Wednesdays and Fridays. Recheck in 8 weeks.

## 2019-11-18 NOTE — Progress Notes (Signed)
Clinical Summary [name] is a 79 y.o.male seen today for follow up of the following medical problems.   1. Afib - he failed extended course of amiodarone along with prior DCCV 05/30/16. Have been working toward rate control strategy - off dilt due to low LVEF. Remains on coreg.   - not interested in NOACs due to cost.   - no recent palpitations - complaitnt with meds - no bleeding on coumadin.    2. Hyperlipidemia - was previously on prava, developed muscle aches. Stopped taking approx December, symptoms resolved. - reports he has tried multiple other statins with Dr Willey Blade -he is on zetiacurrently and tolerating.  - compliant with zetia, labs followed by pcp - reports pcp did recent labs  3. HTN - compliant with labs     4. Chronic systolic HF - echo [id number] LVEF 30-35%, severe basal infeiror hypokinesis. Cannot eval diasotlic function.  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 No SOB or DOE   Past Medical History:  Diagnosis Date  . 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 BPH (benign prostatic hyperplasia)   . Chronic anticoagulation   . Chronic systolic dysfunction of left ventricle    EF 35%  . Degenerative joint disease    Right knee  . Hyperlipidemia   . Hypertension   . Longstanding persistent atrial fibrillation   . Nonischemic cardiomyopathy ([place])   . Obesity   . Sleep apnea    Rx-CPAP     No Known Allergies   Current Outpatient Medications  Medication Sig Dispense Refill  . carvedilol (COREG) 25 MG tablet Take 1 tablet by mouth twice daily 180 tablet 3  . digoxin (LANOXIN) 0.125 MG tablet Take 1 tablet by mouth once daily 90 tablet 0  . ezetimibe (ZETIA) 10 MG tablet Take 1 tablet by mouth once daily 90 tablet 0  . furosemide (LASIX)  40 MG tablet TAKE 1 TABLET BY MOUTH EVERY OTHER DAY - OFFICE VISIT NEEDED 40 tablet 7  . lisinopril (ZESTRIL) 20 MG tablet Take 1 tablet by mouth twice daily 180 tablet 3  . warfarin (COUMADIN) 10 MG tablet TAKE 1/2 TABLET DAILY EXCEPT 1 TABLET ON MONDAY, WEDNESDAYS AND FRIDAYS 45 tablet 6   No current facility-administered medications for this visit.     Past Surgical History:  Procedure Laterality Date  . CARDIAC CATHETERIZATION N/A 03/04/2016   Procedure: Left Heart Cath and Coronary Angiography;  Surgeon: Troy Sine, MD;  Location: [place] [facility];  Service: Cardiovascular;  Laterality: N/A;  . CARDIOVERSION N/A 05/30/2016   Procedure: CARDIOVERSION;  Surgeon: Arnoldo Lenis, MD;  Location: [facility];  Service: Endoscopy;  Laterality: N/A;  . CATARACT EXTRACTION W/PHACO Left 01/03/2017   Procedure: CATARACT EXTRACTION PHACO AND INTRAOCULAR LENS PLACEMENT (IOC);  Surgeon: Rutherford Guys, MD;  Location: [facility];  Service: Ophthalmology;  Laterality: Left;  CDE: 5.74  . COLONOSCOPY  03/10/2005  . 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 TOTAL KNEE ARTHROPLASTY Left 03/10/2014   Procedure: LEFT TOTAL KNEE ARTHROPLASTY;  Surgeon: Gearlean Alf, MD;  Location: [facility];  Service: Orthopedics;  Laterality: Left;  Marland Kitchen VENTRAL HERNIA REPAIR  03/2008,  02/2009     No Known Allergies    Family History  Problem Relation Age of Onset  . Heart attack Father      Social History [name] reports that he quit smoking about 20 months ago. His smoking use included cigars. He has never used smokeless tobacco. [name] reports no history of alcohol use.   Review of Systems CONSTITUTIONAL: No weight loss, fever, chills, weakness or fatigue.  HEENT: Eyes: No visual loss, blurred vision, double vision or yellow sclerae.No hearing loss, sneezing, congestion,  runny nose or sore throat.  SKIN: No rash or itching.  CARDIOVASCULAR: per hpi RESPIRATORY: No shortness of breath, cough or sputum.  GASTROINTESTINAL: No anorexia, nausea, vomiting or diarrhea. No abdominal pain or blood.  GENITOURINARY: No burning on urination, no polyuria NEUROLOGICAL: No headache, dizziness, syncope, paralysis, ataxia, numbness or tingling in the extremities. No change in bowel or bladder control.  MUSCULOSKELETAL: No muscle, back pain, joint pain or stiffness.  LYMPHATICS: No enlarged nodes. No history of splenectomy.  PSYCHIATRIC: No history of depression or anxiety.  ENDOCRINOLOGIC: No reports of sweating, cold or heat intolerance. No polyuria or polydipsia.  Marland Kitchen   Physical Examination Today's Vitals   11/18/19 1411  BP: 138/80  Pulse: 89  SpO2: 97%  Weight: 242 lb (109.8 kg)  Height: 5\' 10"  (1.778 m)   Body mass index is 34.72 kg/m.  Gen: resting comfortably, no acute distress HEENT: no scleral icterus, pupils equal round and reactive, no palptable cervical adenopathy,  CV: RRR, no m/r/g, no jvd Resp: Clear to auscultation bilaterally GI: abdomen is soft, non-tender, non-distended, normal bowel sounds, no hepatosplenomegaly MSK: extremities are warm, no edema.  Skin: warm, no rash Neuro:  no focal deficits Psych: appropriate affect   Diagnostic Studies  Echo 09/2010: LVEF 55%, mild LVH, mild MR, mod LAE.   02/2016 Echo Study Conclusions  - Left ventricle: The cavity size was moderately dilated. Wall  thickness was at the upper limits of normal. Systolic function  was moderately to severely reduced. The estimated ejection  fraction was in the range of 30% to 35%. Diffuse hypokinesis.  There is severe hypokinesis of the basalinferior myocardium. The  study is not technically sufficient to allow evaluation of LV  diastolic function. - Mitral valve: Mildly thickened leaflets . There was mild  regurgitation. - Left atrium: The atrium was  severely dilated. - Right ventricle: The cavity size was mildly dilated. Systolic  function was mildly reduced. - Right atrium: The atrium was severely dilated. - Atrial septum: No defect or patent foramen ovale was identified. - Tricuspid valve: There was trivial regurgitation. Peak RV-RA  gradient (S): 26 mm Hg. - Pericardium, extracardiac: There was no pericardial effusion.  Impressions:  - Upper normal LV wall thickness with moderate chamber dilatation  and LVEF approximately 30-35% in the setting of atrial  fibrillation. LVEF decreased compared to previous study in 2011.  Diffuse hypokinesis, most prominent in the inferior wall.  Indeterminate diastolic function. Severe biatrial enlargement.  Mildly thickened mitral leaflets with mild mitral regurgitation.  Mildly dilated RV with reduced contraction. Trivial tricuspid  regurgitation, RV-RA gradient 26 mmHg. Unable to assess CVP.  02/2016 Cath  Prox LAD lesion, 10% stenosed.  Mid LAD lesion, 20% stenosed.  Ost 3rd Diag lesion, 40% stenosed.  Prox RCA lesion, 20% stenosed.   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 Left ventriculography was not done.   RECOMMENDATION: Medical therapy for his cardiomyopathy with improved rate control of his atrial fibrillation.   Assessment and Plan  1. Afib - denies any symptoms, continue current meds  2. Hyperlipidemia - intolerant to statins, has been on zetia -request labs from pcp  3. HTN - essentially at goal, continue current meds  4. Chronic systolic HF - Aldactone stopped prevoiusly due to hyperkalemia - has not been able to change to entrest due to cost - continue current meds      Arnoldo Lenis, M.D.

## 2019-11-21 DIAGNOSIS — I5022 Chronic systolic (congestive) heart failure: Secondary | ICD-10-CM | POA: Diagnosis not present

## 2019-11-21 DIAGNOSIS — N1831 Chronic kidney disease, stage 3a: Secondary | ICD-10-CM | POA: Diagnosis not present

## 2019-11-21 DIAGNOSIS — E1122 Type 2 diabetes mellitus with diabetic chronic kidney disease: Secondary | ICD-10-CM | POA: Diagnosis not present

## 2019-11-21 DIAGNOSIS — I482 Chronic atrial fibrillation, unspecified: Secondary | ICD-10-CM | POA: Diagnosis not present

## 2019-11-27 ENCOUNTER — Telehealth: Payer: Self-pay | Admitting: *Deleted

## 2019-11-27 DIAGNOSIS — E782 Mixed hyperlipidemia: Secondary | ICD-10-CM

## 2019-11-27 NOTE — Telephone Encounter (Signed)
Patient aware.  Entresto prescription d/c'd at [facility] / [place].

## 2019-11-27 NOTE — Telephone Encounter (Signed)
Can d/c entresto, continue his prior regimen including the lisinopril   Zandra Abts MD

## 2019-11-27 NOTE — Telephone Encounter (Signed)
-----   Message from Arnoldo Lenis, MD sent at 11/27/2019  1:57 PM EST ----- Labs from pcp reviewed, the cholesterol if from a year ago. Can we repeat a lipid panel. Be sure he knows it should be fasting, that includes no coffee, mild etc. Can have some water the morning but nothing else  Zandra Abts MD

## 2019-11-27 NOTE — Telephone Encounter (Signed)
Pt voiced understanding of fasting and will go to [facility] for labs - will mail orders

## 2019-11-27 NOTE — Telephone Encounter (Signed)
Patient has not been able to start the [facility] due to cost.  [employer] did approve the prior authorization - good until 10/23/2020.  His co-pay is $490.00.  He would have to meet this deductible prior to getting his medication at a lower co-pay.  Delene Loll is listed as a Tier 3 on his plan.  Retail would be $45 - 47.00 & mail order 90 day would be $125.00.  Due to his income for a married couple - he would not even qualify for medicare low income subsidy.  He states that he has continued to take all of the current medications listed & is doing good from a cardiac perspective.  Patient already has a follow up scheduled with you for May.

## 2019-12-06 DIAGNOSIS — E782 Mixed hyperlipidemia: Secondary | ICD-10-CM | POA: Diagnosis not present

## 2019-12-07 DIAGNOSIS — Z23 Encounter for immunization: Secondary | ICD-10-CM | POA: Diagnosis not present

## 2019-12-13 ENCOUNTER — Telehealth: Payer: Self-pay | Admitting: *Deleted

## 2019-12-13 NOTE — Telephone Encounter (Signed)
-----   Message from Arnoldo Lenis, MD sent at 12/13/2019  8:40 AM EST ----- Labs show a type of cholesterol call triglcyerides is very high. Can we verify this was a true fasting sample, that he had not had any food the morning of, no milk or coffee creamer. Typically TGs increase from eating lots of fried foods, sweets, cookies, pastries, ice cream, alcohol, sodas. Needs to adjust diets. If true fasting sample please let me know, we will need to start an additional medication   Zandra Abts MD

## 2019-12-13 NOTE — Telephone Encounter (Signed)
Pt verified that he was fasting when labs were done. Will forward to Dr Harl Bowie

## 2019-12-24 NOTE — Telephone Encounter (Signed)
Can he start vascepa 2g bid, let us know if any cost issues with his insurance   Zandra Abts MD

## 2019-12-25 MED ORDER — ICOSAPENT ETHYL 1 G PO CAPS: 2.0000 g | ORAL_CAPSULE | Freq: Two times a day (BID) | ORAL | 3 refills | Status: DC

## 2019-12-25 NOTE — Telephone Encounter (Signed)
Pt voiced understanding - Medication sent to pharmacy and pt will f.u if he cannot afford medication

## 2019-12-31 ENCOUNTER — Telehealth: Payer: Self-pay | Admitting: Cardiology

## 2019-12-31 NOTE — Telephone Encounter (Signed)
Patient called stating that he cannot afford medication icosapent Ethyl (VASCEPA) 1 g capsule

## 2019-12-31 NOTE — Telephone Encounter (Signed)
Per [facility], patient's responsibility for vascepa is $181.22. This is only for 60 capsules orr 15 day supply

## 2020-01-03 DIAGNOSIS — Z23 Encounter for immunization: Secondary | ICD-10-CM | POA: Diagnosis not present

## 2020-01-11 ENCOUNTER — Other Ambulatory Visit: Payer: Self-pay | Admitting: Cardiology

## 2020-01-16 ENCOUNTER — Other Ambulatory Visit: Payer: Self-pay

## 2020-01-16 ENCOUNTER — Ambulatory Visit (INDEPENDENT_AMBULATORY_CARE_PROVIDER_SITE_OTHER): Payer: Medicare Other | Admitting: *Deleted

## 2020-01-16 DIAGNOSIS — Z5181 Encounter for therapeutic drug level monitoring: Secondary | ICD-10-CM | POA: Diagnosis not present

## 2020-01-16 DIAGNOSIS — I4891 Unspecified atrial fibrillation: Secondary | ICD-10-CM

## 2020-01-16 LAB — POCT INR: INR: 1.9 — AB (ref 2.0–3.0)

## 2020-01-16 NOTE — Patient Instructions (Signed)
Take warfarin 1 tablet tonight then resume 1/2 tablet daily except 1 tablet on Mondays, Wednesdays and Fridays. Recheck in 8 weeks.

## 2020-01-23 NOTE — Telephone Encounter (Signed)
If vascepa too expensive can d/c and start tricor 145mg  daily   Zandra Abts MD

## 2020-01-28 MED ORDER — FENOFIBRATE 145 MG PO TABS: 145.0000 mg | ORAL_TABLET | Freq: Every day | ORAL | 2 refills | Status: DC

## 2020-01-28 NOTE — Addendum Note (Signed)
Addended by: Merlene Laughter on: 01/28/2020 02:09 PM   Modules accepted: Orders

## 2020-01-28 NOTE — Telephone Encounter (Signed)
Patient informed and verbalized understanding of plan. 

## 2020-02-07 ENCOUNTER — Other Ambulatory Visit: Payer: Self-pay | Admitting: Cardiology

## 2020-02-12 DIAGNOSIS — I482 Chronic atrial fibrillation, unspecified: Secondary | ICD-10-CM | POA: Diagnosis not present

## 2020-02-12 DIAGNOSIS — E1129 Type 2 diabetes mellitus with other diabetic kidney complication: Secondary | ICD-10-CM | POA: Diagnosis not present

## 2020-02-12 DIAGNOSIS — I5022 Chronic systolic (congestive) heart failure: Secondary | ICD-10-CM | POA: Diagnosis not present

## 2020-02-12 DIAGNOSIS — Z79899 Other long term (current) drug therapy: Secondary | ICD-10-CM | POA: Diagnosis not present

## 2020-02-12 DIAGNOSIS — N183 Chronic kidney disease, stage 3 unspecified: Secondary | ICD-10-CM | POA: Diagnosis not present

## 2020-02-20 DIAGNOSIS — E1122 Type 2 diabetes mellitus with diabetic chronic kidney disease: Secondary | ICD-10-CM | POA: Diagnosis not present

## 2020-02-20 DIAGNOSIS — I5022 Chronic systolic (congestive) heart failure: Secondary | ICD-10-CM | POA: Diagnosis not present

## 2020-02-20 DIAGNOSIS — Z6837 Body mass index (BMI) 37.0-37.9, adult: Secondary | ICD-10-CM | POA: Diagnosis not present

## 2020-02-20 DIAGNOSIS — N1832 Chronic kidney disease, stage 3b: Secondary | ICD-10-CM | POA: Diagnosis not present

## 2020-02-22 ENCOUNTER — Other Ambulatory Visit: Payer: Self-pay | Admitting: Cardiology

## 2020-03-05 ENCOUNTER — Telehealth: Payer: Self-pay | Admitting: Cardiology

## 2020-03-05 NOTE — Telephone Encounter (Signed)
Patient has upcoming appt. He wants to know if he needs labs before the appointment. Please call cell # [phone]

## 2020-03-05 NOTE — Telephone Encounter (Signed)
Pt aware that he doesn't need labs prior to 5/28 appt

## 2020-03-09 ENCOUNTER — Other Ambulatory Visit: Payer: Self-pay

## 2020-03-09 ENCOUNTER — Ambulatory Visit (INDEPENDENT_AMBULATORY_CARE_PROVIDER_SITE_OTHER): Payer: Medicare Other | Admitting: *Deleted

## 2020-03-09 DIAGNOSIS — Z5181 Encounter for therapeutic drug level monitoring: Secondary | ICD-10-CM

## 2020-03-09 DIAGNOSIS — I4891 Unspecified atrial fibrillation: Secondary | ICD-10-CM | POA: Diagnosis not present

## 2020-03-09 LAB — POCT INR: INR: 2 (ref 2.0–3.0)

## 2020-03-09 NOTE — Patient Instructions (Signed)
Continue warfarin 1/2 tablet daily except 1 tablet on Mondays, Wednesdays and Fridays. Recheck in 8 weeks. 

## 2020-03-14 ENCOUNTER — Other Ambulatory Visit: Payer: Self-pay | Admitting: Cardiology

## 2020-03-20 ENCOUNTER — Ambulatory Visit (INDEPENDENT_AMBULATORY_CARE_PROVIDER_SITE_OTHER): Payer: Medicare Other | Admitting: Cardiology

## 2020-03-20 ENCOUNTER — Encounter: Payer: Self-pay | Admitting: Cardiology

## 2020-03-20 ENCOUNTER — Other Ambulatory Visit: Payer: Self-pay

## 2020-03-20 VITALS — BP 110/72 | HR 85 | Ht 70.0 in | Wt 235.0 lb

## 2020-03-20 DIAGNOSIS — I5022 Chronic systolic (congestive) heart failure: Secondary | ICD-10-CM

## 2020-03-20 DIAGNOSIS — I10 Essential (primary) hypertension: Secondary | ICD-10-CM | POA: Diagnosis not present

## 2020-03-20 DIAGNOSIS — I4891 Unspecified atrial fibrillation: Secondary | ICD-10-CM | POA: Diagnosis not present

## 2020-03-20 DIAGNOSIS — E782 Mixed hyperlipidemia: Secondary | ICD-10-CM | POA: Diagnosis not present

## 2020-03-20 NOTE — Patient Instructions (Signed)

## 2020-03-20 NOTE — Progress Notes (Signed)
Clinical Summary Joshua [name] is a 79 y.o.male seen today for follow up of the following medical problems.   1. Afib - he failed extended course of amiodarone along with prior DCCV 05/30/16. Have been working toward rate control strategy - off dilt due to low LVEF. Remains on coreg.   - not interested in NOACs due to cost.  - no palpitations - no bleeding on coumadin.   2. Hyperlipidemia - was previously on prava, developed muscle aches. Stopped taking approx December, symptoms resolved. - reports he has tried multiple other statins with Dr Willey Blade -he is on zetiacurrently and tolerating.    -very high TGs in 400s, vascepa was too expensive so started fenofibrate 145mg  daily.  -recent labs with pcp   -  3. HTN -compliant with meds     4. Chronic systolic HF - echo [id number] LVEF 30-35%, severe basal infeiror hypokinesis. Cannot eval diasotlic function.  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    5. DM2 - pcp just started metformin - working to lose weight, down 7 lbs.   SH: had both covid shots   Past Medical History:  Diagnosis Date  . 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 BPH (benign prostatic hyperplasia)   . Chronic anticoagulation   . Chronic systolic dysfunction of left ventricle    EF 35%  . Degenerative joint disease    Right knee  . Hyperlipidemia   . Hypertension   . Longstanding persistent atrial fibrillation ([place])   . Nonischemic cardiomyopathy ([place])   . Obesity   . Sleep apnea    Rx-CPAP     No Known Allergies   Current Outpatient Medications  Medication Sig Dispense Refill  . carvedilol (COREG) 25 MG tablet Take 1 tablet by mouth twice daily 180 tablet 3  . digoxin (LANOXIN) 0.125 MG tablet Take 1 tablet by  mouth once daily 90 tablet 1  . ezetimibe (ZETIA) 10 MG tablet Take 1 tablet by mouth once daily 90 tablet 1  . fenofibrate (TRICOR) 145 MG tablet Take 1 tablet (145 mg total) by mouth daily. 90 tablet 2  . furosemide (LASIX) 40 MG tablet TAKE 1 TABLET BY MOUTH EVERY OTHER DAY 45 tablet 3  . icosapent Ethyl (VASCEPA) 1 g capsule Take 2 capsules (2 g total) by mouth 2 (two) times daily. 60 capsule 3  . lisinopril (ZESTRIL) 20 MG tablet Take 1 tablet by mouth twice daily 180 tablet 1  . warfarin (COUMADIN) 10 MG tablet Take as directed by the coumadin clinic 30 tablet 2   No current facility-administered medications for this visit.     Past Surgical History:  Procedure Laterality Date  . CARDIAC CATHETERIZATION N/A 03/04/2016   Procedure: Left Heart Cath and Coronary Angiography;  Surgeon: Troy Sine, MD;  Location: [place] [facility];  Service: Cardiovascular;  Laterality: N/A;  . CARDIOVERSION N/A 05/30/2016   Procedure: CARDIOVERSION;  Surgeon: Arnoldo Lenis, MD;  Location: [facility];  Service: Endoscopy;  Laterality: N/A;  . CATARACT EXTRACTION W/PHACO Left 01/03/2017   Procedure: CATARACT EXTRACTION PHACO AND INTRAOCULAR LENS PLACEMENT (IOC);  Surgeon: Rutherford Guys, MD;  Location: [facility];  Service: Ophthalmology;  Laterality: Left;  CDE: 5.74  . COLONOSCOPY  03/10/2005  . TOTAL KNEE ARTHROPLASTY Right 01/14/2013  Procedure: TOTAL KNEE ARTHROPLASTY;  Surgeon: Gearlean Alf, MD;  Location: [facility];  Service: Orthopedics;  Laterality: Right;  . TOTAL KNEE ARTHROPLASTY Left 03/10/2014   Procedure: LEFT TOTAL KNEE ARTHROPLASTY;  Surgeon: Gearlean Alf, MD;  Location: [facility];  Service: Orthopedics;  Laterality: Left;  Marland Kitchen VENTRAL HERNIA REPAIR  03/2008, 02/2009     No Known Allergies    Family History  Problem Relation Age of Onset  . Heart attack Father      Social History Mr. Gangl reports that he quit smoking about 2 years ago. His smoking use included cigars. He has never  used smokeless tobacco. Mr. Crocitto reports no history of alcohol use.   Review of Systems CONSTITUTIONAL: No weight loss, fever, chills, weakness or fatigue.  HEENT: Eyes: No visual loss, blurred vision, double vision or yellow sclerae.No hearing loss, sneezing, congestion, runny nose or sore throat.  SKIN: No rash or itching.  CARDIOVASCULAR: per hpi RESPIRATORY: No shortness of breath, cough or sputum.  GASTROINTESTINAL: No anorexia, nausea, vomiting or diarrhea. No abdominal pain or blood.  GENITOURINARY: No burning on urination, no polyuria NEUROLOGICAL: No headache, dizziness, syncope, paralysis, ataxia, numbness or tingling in the extremities. No change in bowel or bladder control.  MUSCULOSKELETAL: No muscle, back pain, joint pain or stiffness.  LYMPHATICS: No enlarged nodes. No history of splenectomy.  PSYCHIATRIC: No history of depression or anxiety.  ENDOCRINOLOGIC: No reports of sweating, cold or heat intolerance. No polyuria or polydipsia.  Marland Kitchen   Physical Examination Today's Vitals   03/20/20 0911  BP: 110/72  Pulse: 85  SpO2: 98%  Weight: 235 lb (106.6 kg)  Height: 5\' 10"  (1.778 m)   Body mass index is 33.72 kg/m.  Gen: resting comfortably, no acute distress HEENT: no scleral icterus, pupils equal round and reactive, no palptable cervical adenopathy,  CV: irreg, no m/r/g, no jvd Resp: Clear to auscultation bilaterally GI: abdomen is soft, non-tender, non-distended, normal bowel sounds, no hepatosplenomegaly MSK: extremities are warm, no edema.  Skin: warm, no rash Neuro:  no focal deficits Psych: appropriate affect   Diagnostic Studies Echo 09/2010: LVEF 55%, mild LVH, mild MR, mod LAE.   02/2016 Echo Study Conclusions  - Left ventricle: The cavity size was moderately dilated. Wall  thickness was at the upper limits of normal. Systolic function  was moderately to severely reduced. The estimated ejection  fraction was in the range of 30% to 35%.  Diffuse hypokinesis.  There is severe hypokinesis of the basalinferior myocardium. The  study is not technically sufficient to allow evaluation of LV  diastolic function. - Mitral valve: Mildly thickened leaflets . There was mild  regurgitation. - Left atrium: The atrium was severely dilated. - Right ventricle: The cavity size was mildly dilated. Systolic  function was mildly reduced. - Right atrium: The atrium was severely dilated. - Atrial septum: No defect or patent foramen ovale was identified. - Tricuspid valve: There was trivial regurgitation. Peak RV-RA  gradient (S): 26 mm Hg. - Pericardium, extracardiac: There was no pericardial effusion.  Impressions:  - Upper normal LV wall thickness with moderate chamber dilatation  and LVEF approximately 30-35% in the setting of atrial  fibrillation. LVEF decreased compared to previous study in 2011.  Diffuse hypokinesis, most prominent in the inferior wall.  Indeterminate diastolic function. Severe biatrial enlargement.  Mildly thickened mitral leaflets with mild mitral regurgitation.  Mildly dilated RV with reduced contraction. Trivial tricuspid  regurgitation, RV-RA gradient 26 mmHg. Unable to assess CVP.  02/2016 Cath  Prox LAD lesion, 10% stenosed.  Mid LAD lesion, 20% stenosed.  Ost 3rd Diag lesion, 40% stenosed.  Prox RCA lesion, 20% stenosed.   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 Left ventriculography was not done.   RECOMMENDATION: Medical therapy for his cardiomyopathy with improved rate control of his atrial fibrillation.    Assessment and Plan   1. Afib -no symptoms, continue current meds - prefers coumadin to NOACs due to cost  2. Hyperlipidemia - intolerant to statins, has been on zetia -TGs in 400s on  last check. Vascepa too expensive, started on fenofibrate  - requestr pcp most recent labs  3. HTN - at goal,continue current meds  4. Chronic systolic HF - Aldactone stopped prevoiusly due to hyperkalemia - has not been able to change to entrest due to cost -no symptoms, continue current meds      Arnoldo Lenis, M.D.

## 2020-03-24 ENCOUNTER — Encounter: Payer: Self-pay | Admitting: *Deleted

## 2020-04-12 ENCOUNTER — Other Ambulatory Visit: Payer: Self-pay | Admitting: Cardiology

## 2020-05-13 ENCOUNTER — Ambulatory Visit (INDEPENDENT_AMBULATORY_CARE_PROVIDER_SITE_OTHER): Payer: Medicare Other | Admitting: *Deleted

## 2020-05-13 DIAGNOSIS — I4891 Unspecified atrial fibrillation: Secondary | ICD-10-CM

## 2020-05-13 DIAGNOSIS — Z5181 Encounter for therapeutic drug level monitoring: Secondary | ICD-10-CM

## 2020-05-13 LAB — POCT INR: INR: 3.1 — AB (ref 2.0–3.0)

## 2020-05-13 NOTE — Patient Instructions (Signed)
Continue warfarin 1/2 tablet daily except 1 tablet on Mondays, Wednesdays and Fridays. Recheck in 8 weeks. 

## 2020-05-15 DIAGNOSIS — E785 Hyperlipidemia, unspecified: Secondary | ICD-10-CM | POA: Diagnosis not present

## 2020-05-15 DIAGNOSIS — I5022 Chronic systolic (congestive) heart failure: Secondary | ICD-10-CM | POA: Diagnosis not present

## 2020-05-15 DIAGNOSIS — I10 Essential (primary) hypertension: Secondary | ICD-10-CM | POA: Diagnosis not present

## 2020-05-15 DIAGNOSIS — Z79899 Other long term (current) drug therapy: Secondary | ICD-10-CM | POA: Diagnosis not present

## 2020-05-15 DIAGNOSIS — E1129 Type 2 diabetes mellitus with other diabetic kidney complication: Secondary | ICD-10-CM | POA: Diagnosis not present

## 2020-05-15 DIAGNOSIS — N183 Chronic kidney disease, stage 3 unspecified: Secondary | ICD-10-CM | POA: Diagnosis not present

## 2020-05-22 DIAGNOSIS — N1832 Chronic kidney disease, stage 3b: Secondary | ICD-10-CM | POA: Diagnosis not present

## 2020-05-22 DIAGNOSIS — Z6835 Body mass index (BMI) 35.0-35.9, adult: Secondary | ICD-10-CM | POA: Diagnosis not present

## 2020-05-22 DIAGNOSIS — I5022 Chronic systolic (congestive) heart failure: Secondary | ICD-10-CM | POA: Diagnosis not present

## 2020-05-22 DIAGNOSIS — E1122 Type 2 diabetes mellitus with diabetic chronic kidney disease: Secondary | ICD-10-CM | POA: Diagnosis not present

## 2020-06-05 ENCOUNTER — Other Ambulatory Visit: Payer: Self-pay | Admitting: Cardiology

## 2020-07-03 DIAGNOSIS — Z23 Encounter for immunization: Secondary | ICD-10-CM | POA: Diagnosis not present

## 2020-07-13 ENCOUNTER — Ambulatory Visit (INDEPENDENT_AMBULATORY_CARE_PROVIDER_SITE_OTHER): Payer: Medicare Other | Admitting: *Deleted

## 2020-07-13 ENCOUNTER — Other Ambulatory Visit: Payer: Self-pay | Admitting: Cardiology

## 2020-07-13 DIAGNOSIS — Z5181 Encounter for therapeutic drug level monitoring: Secondary | ICD-10-CM | POA: Diagnosis not present

## 2020-07-13 DIAGNOSIS — I4891 Unspecified atrial fibrillation: Secondary | ICD-10-CM | POA: Diagnosis not present

## 2020-07-13 LAB — POCT INR: INR: 3.1 — AB (ref 2.0–3.0)

## 2020-07-13 NOTE — Patient Instructions (Signed)
Take warfarin 1/2 tablet tonight then resume 1/2 tablet daily except 1 tablet on Mondays, Wednesdays and Fridays. Recheck in 8 weeks.

## 2020-07-24 ENCOUNTER — Emergency Department (HOSPITAL_COMMUNITY)
Admission: EM | Admit: 2020-07-24 | Discharge: 2020-07-24 | Disposition: A | Discharge status: Home / Self Care | Payer: Medicare Other | Attending: Emergency Medicine | Admitting: Emergency Medicine

## 2020-07-24 ENCOUNTER — Other Ambulatory Visit: Payer: Self-pay

## 2020-07-24 ENCOUNTER — Emergency Department (HOSPITAL_COMMUNITY): Payer: Medicare Other

## 2020-07-24 DIAGNOSIS — Z7901 Long term (current) use of anticoagulants: Secondary | ICD-10-CM | POA: Insufficient documentation

## 2020-07-24 DIAGNOSIS — I110 Hypertensive heart disease with heart failure: Secondary | ICD-10-CM | POA: Diagnosis not present

## 2020-07-24 DIAGNOSIS — Z96653 Presence of artificial knee joint, bilateral: Secondary | ICD-10-CM | POA: Diagnosis not present

## 2020-07-24 DIAGNOSIS — Z79899 Other long term (current) drug therapy: Secondary | ICD-10-CM | POA: Insufficient documentation

## 2020-07-24 DIAGNOSIS — S8991XA Unspecified injury of right lower leg, initial encounter: Secondary | ICD-10-CM | POA: Diagnosis not present

## 2020-07-24 DIAGNOSIS — I2510 Atherosclerotic heart disease of native coronary artery without angina pectoris: Secondary | ICD-10-CM | POA: Insufficient documentation

## 2020-07-24 DIAGNOSIS — W228XXA Striking against or struck by other objects, initial encounter: Secondary | ICD-10-CM | POA: Insufficient documentation

## 2020-07-24 DIAGNOSIS — S80811A Abrasion, right lower leg, initial encounter: Secondary | ICD-10-CM | POA: Insufficient documentation

## 2020-07-24 DIAGNOSIS — Z87891 Personal history of nicotine dependence: Secondary | ICD-10-CM | POA: Insufficient documentation

## 2020-07-24 DIAGNOSIS — I509 Heart failure, unspecified: Secondary | ICD-10-CM | POA: Diagnosis not present

## 2020-07-24 DIAGNOSIS — S59902A Unspecified injury of left elbow, initial encounter: Secondary | ICD-10-CM | POA: Diagnosis not present

## 2020-07-24 NOTE — ED Provider Notes (Signed)
[facility] EMERGENCY DEPARTMENT Provider Note   CSN: [id number] Arrival date & time: 07/24/20  1150     History Chief Complaint  Patient presents with  . Leg Injury    Joshua [name] is a 79 y.o. male.  79 year old gentleman presents today after he was unloading truck and the front tire ran over his right anterior leg he fell back and hit his L elbow.  He denies any head trauma, no loss of consciousness, alert and oriented x4.  X-rays of right leg and left elbow reveal no acute abnormalities, no fractures.  Patient has abrasion from mid anterior shin down to right lateral malleolus.        Past Medical History:  Diagnosis Date  . 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 BPH (benign prostatic hyperplasia)   . Chronic anticoagulation   . Chronic systolic dysfunction of left ventricle    EF 35%  . Degenerative joint disease    Right knee  . Hyperlipidemia   . Hypertension   . Longstanding persistent atrial fibrillation ([place])   . Nonischemic cardiomyopathy ([place])   . Obesity   . Sleep apnea    Rx-CPAP    Patient Active Problem List   Diagnosis Date Noted  . CAD- minor CAD at cath 03/04/16 03/06/2016  . Acute CHF - secondary to AF with RVR 03/06/2016  . AF (atrial fibrillation) ([place]) 03/04/2016  . Abnormal nuclear stress test   . PAF (paroxysmal atrial fibrillation) ([place])   . Non-ischemic cardiomyopathy ([place])   . Edema 03/31/2014  . Encounter for therapeutic drug monitoring 12/23/2013  . Knee stiffness 02/06/2013  . Knee pain 02/06/2013  . Difficulty in walking(719.7) 02/06/2013  . Acute blood loss anemia 01/17/2013  . Postop Hyponatremia 01/15/2013  . OA (osteoarthritis) of knee 01/14/2013  . Thrombocytopenia ([place]) 05/26/2011  . Sleep apnea   . Degenerative joint disease   . Chronic anticoagulation 02/02/2011  . Hyperlipidemia 07/29/2010  . Hypertension 12/07/2007    Past Surgical History:  Procedure Laterality Date  . CARDIAC CATHETERIZATION N/A  03/04/2016   Procedure: Left Heart Cath and Coronary Angiography;  Surgeon: Troy Sine, MD;  Location: [place] [facility];  Service: Cardiovascular;  Laterality: N/A;  . CARDIOVERSION N/A 05/30/2016   Procedure: CARDIOVERSION;  Surgeon: Arnoldo Lenis, MD;  Location: [facility];  Service: Endoscopy;  Laterality: N/A;  . CATARACT EXTRACTION W/PHACO Left 01/03/2017   Procedure: CATARACT EXTRACTION PHACO AND INTRAOCULAR LENS PLACEMENT (IOC);  Surgeon: Rutherford Guys, MD;  Location: [facility];  Service: Ophthalmology;  Laterality: Left;  CDE: 5.74  . COLONOSCOPY  03/10/2005  . 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 TOTAL KNEE ARTHROPLASTY Left 03/10/2014   Procedure: LEFT TOTAL KNEE ARTHROPLASTY;  Surgeon: Gearlean Alf, MD;  Location: [facility];  Service: Orthopedics;  Laterality: Left;  Marland Kitchen VENTRAL HERNIA REPAIR  03/2008, 02/2009       Family History  Problem Relation Age of Onset  . Heart attack Father     Social History   Tobacco Use  . Smoking status: Former Smoker    Types: Cigars    Quit date: 02/21/2018    Years since quitting: 2.4  . Smokeless tobacco: Never Used  . Tobacco comment: Occasional cigar twice a year   Substance Use Topics  . Alcohol use: No    Alcohol/week:  0.0 standard drinks  . Drug use: No    Home Medications Prior to Admission medications   Medication Sig Start Date End Date Taking? Authorizing Provider  ezetimibe (ZETIA) 10 MG tablet Take 1 tablet by mouth once daily 07/13/20   Arnoldo Lenis, MD  carvedilol (COREG) 25 MG tablet Take 1 tablet by mouth twice daily 04/13/20   Arnoldo Lenis, MD  digoxin (LANOXIN) 0.125 MG tablet Take 1 tablet by mouth once daily 02/24/20   Arnoldo Lenis, MD  fenofibrate (TRICOR) 145 MG tablet Take 1 tablet (145 mg total) by mouth daily. 01/28/20   Arnoldo Lenis, MD  furosemide (LASIX) 40 MG  tablet TAKE 1 TABLET BY MOUTH EVERY OTHER DAY 11/18/19   Arnoldo Lenis, MD  lisinopril (ZESTRIL) 20 MG tablet Take 1 tablet by mouth twice daily 03/16/20   Arnoldo Lenis, MD  metFORMIN (GLUCOPHAGE) 500 MG tablet Take 500 mg by mouth 2 (two) times daily with a meal.    [provider]  warfarin (COUMADIN) 10 MG tablet Take as directed by the coumadin clinic 06/05/20   Arnoldo Lenis, MD    Allergies    Patient has no known allergies.  Review of Systems   Review of Systems  Constitutional: Negative for chills and fever.  Musculoskeletal:       Abrasion to r leg  Skin: Positive for wound.  All other systems reviewed and are negative.   Physical Exam Updated Vital Signs BP 131/80 (BP Location: Right Arm)   Pulse (!) 52   Temp 99 F (37.2 C) (Oral)   Resp 20   Ht 5\' 10"  (1.778 m)   Wt 102.1 kg   SpO2 100%   BMI 32.28 kg/m   Physical Exam Vitals and nursing note reviewed.  Constitutional:      General: He is not in acute distress.    Appearance: Normal appearance. He is obese. He is not ill-appearing, toxic-appearing or diaphoretic.  Musculoskeletal:        General: Signs of injury present.     Comments: Abrasion to right anterior shin down to right lateral malleolus  Skin:    General: Skin is warm and dry.  Neurological:     General: No focal deficit present.     Mental Status: He is alert and oriented to person, place, and time. Mental status is at baseline.  Psychiatric:        Mood and Affect: Mood normal.        Behavior: Behavior normal.     ED Results / Procedures / Treatments   Labs (all labs ordered are listed, but only abnormal results are displayed) Labs Reviewed - No data to display  EKG None  Radiology DG Elbow Complete Left  Result Date: 07/24/2020 CLINICAL DATA:  Left elbow pain after injury. EXAM: LEFT ELBOW - COMPLETE 3+ VIEW COMPARISON:  None. FINDINGS: There is no evidence of fracture, dislocation, or joint effusion. There  is no evidence of arthropathy or other focal bone abnormality. Soft tissues are unremarkable. IMPRESSION: Negative. Electronically Signed   By: Marijo Conception M.D.   On: 07/24/2020 13:01   DG Tibia/Fibula Right  Result Date: 07/24/2020 CLINICAL DATA:  Right leg pain after injury. EXAM: RIGHT TIBIA AND FIBULA - 2 VIEW COMPARISON:  None. FINDINGS: Status post right total knee arthroplasty. No fracture or dislocation is noted. Soft tissues are unremarkable. IMPRESSION: No acute abnormality seen in the right tibia or fibula. Electronically Signed  By: Marijo Conception M.D.   On: 07/24/2020 13:00    Procedures Procedures (including critical care time)  Medications Ordered in ED Medications - No data to display  ED Course  I have reviewed the triage vital signs and the nursing notes.  Pertinent labs & imaging results that were available during my care of the patient were reviewed by me and considered in my medical decision making (see chart for details).  Cleaned and dressed abrasion of R leg with saline, alcohol, placed a xeroform dressing with nonadherent gauze and ace wrap. Instructed patient to keep it clean with soap and water, use vaseline, wrap it in clean gauze if working outside. Return precautions given.   MDM Rules/Calculators/A&P                           Final Clinical Impression(s) / ED Diagnoses Final diagnoses:  Abrasion of right lower leg, initial encounter    Rx / DC Orders ED Discharge Orders    None       Gladys Damme, MD 07/24/20 1716    Elnora Morrison, MD 07/25/20 2320

## 2020-07-24 NOTE — Discharge Instructions (Addendum)
It was a pleasure to meet you today. You have an abrasion to your right leg which we have cleaned and wrapped. There is no sign of a fracture, or broken bone. You can remove the dressing tomorrow, wash with soap and water, use vaseline to help the skin heal, and dress with fresh gauze if you are working or outside. We recommend keeping your leg elevated to help with swelling, you may use tylenol and ice to help with pain. If you have any signs or symptoms of infection such as redness, swelling, warmth, or pustulant drainage, please see  your primary care provider or nearest medical facility for evaluation.

## 2020-07-24 NOTE — ED Triage Notes (Signed)
Pt was unloading his truck from a flatbed. The tire of the truck ran over his right leg. Abrasion noted to right calf. Ambulatory.

## 2020-07-28 ENCOUNTER — Emergency Department (HOSPITAL_COMMUNITY)
Admission: EM | Admit: 2020-07-28 | Discharge: 2020-07-28 | Disposition: A | Discharge status: Home / Self Care | Payer: Medicare Other | Attending: Emergency Medicine | Admitting: Emergency Medicine

## 2020-07-28 ENCOUNTER — Other Ambulatory Visit: Payer: Self-pay

## 2020-07-28 ENCOUNTER — Ambulatory Visit
Admission: EM | Admit: 2020-07-28 | Discharge: 2020-07-28 | Disposition: A | Discharge status: Home / Self Care | Payer: Medicare Other

## 2020-07-28 ENCOUNTER — Emergency Department (HOSPITAL_COMMUNITY): Payer: Medicare Other

## 2020-07-28 DIAGNOSIS — Z7984 Long term (current) use of oral hypoglycemic drugs: Secondary | ICD-10-CM | POA: Diagnosis not present

## 2020-07-28 DIAGNOSIS — Z87891 Personal history of nicotine dependence: Secondary | ICD-10-CM | POA: Insufficient documentation

## 2020-07-28 DIAGNOSIS — I2510 Atherosclerotic heart disease of native coronary artery without angina pectoris: Secondary | ICD-10-CM | POA: Insufficient documentation

## 2020-07-28 DIAGNOSIS — Z7901 Long term (current) use of anticoagulants: Secondary | ICD-10-CM | POA: Diagnosis not present

## 2020-07-28 DIAGNOSIS — Z96653 Presence of artificial knee joint, bilateral: Secondary | ICD-10-CM | POA: Diagnosis not present

## 2020-07-28 DIAGNOSIS — S9781XA Crushing injury of right foot, initial encounter: Secondary | ICD-10-CM | POA: Diagnosis not present

## 2020-07-28 DIAGNOSIS — S91301A Unspecified open wound, right foot, initial encounter: Secondary | ICD-10-CM | POA: Diagnosis not present

## 2020-07-28 DIAGNOSIS — I501 Left ventricular failure: Secondary | ICD-10-CM | POA: Insufficient documentation

## 2020-07-28 DIAGNOSIS — Z79899 Other long term (current) drug therapy: Secondary | ICD-10-CM | POA: Diagnosis not present

## 2020-07-28 DIAGNOSIS — M79661 Pain in right lower leg: Secondary | ICD-10-CM | POA: Diagnosis present

## 2020-07-28 DIAGNOSIS — R600 Localized edema: Secondary | ICD-10-CM | POA: Diagnosis not present

## 2020-07-28 DIAGNOSIS — L03115 Cellulitis of right lower limb: Secondary | ICD-10-CM | POA: Insufficient documentation

## 2020-07-28 DIAGNOSIS — I110 Hypertensive heart disease with heart failure: Secondary | ICD-10-CM | POA: Diagnosis not present

## 2020-07-28 LAB — BASIC METABOLIC PANEL
Anion gap: 9 (ref 5–15)
BUN: 32 mg/dL — ABNORMAL HIGH (ref 8–23)
CO2: 26 mmol/L (ref 22–32)
Calcium: 9.2 mg/dL (ref 8.9–10.3)
Chloride: 102 mmol/L (ref 98–111)
Creatinine, Ser: 1.75 mg/dL — ABNORMAL HIGH (ref 0.61–1.24)
GFR calc non Af Amer: 36 mL/min — ABNORMAL LOW (ref >60)
Glucose, Bld: 108 mg/dL — ABNORMAL HIGH (ref 70–99)
Potassium: 4.1 mmol/L (ref 3.5–5.1)
Sodium: 137 mmol/L (ref 135–145)

## 2020-07-28 LAB — CBC WITH DIFFERENTIAL/PLATELET
Abs Immature Granulocytes: 0.05 10*3/uL (ref 0.00–0.07)
Basophils Absolute: 0 10*3/uL (ref 0.0–0.1)
Basophils Relative: 1 %
Eosinophils Absolute: 0.1 10*3/uL (ref 0.0–0.5)
Eosinophils Relative: 1 %
HCT: 35.2 % — ABNORMAL LOW (ref 39.0–52.0)
Hemoglobin: 11.7 g/dL — ABNORMAL LOW (ref 13.0–17.0)
Immature Granulocytes: 1 %
Lymphocytes Relative: 21 %
Lymphs Abs: 1.6 10*3/uL (ref 0.7–4.0)
MCH: 32 pg (ref 26.0–34.0)
MCHC: 33.2 g/dL (ref 30.0–36.0)
MCV: 96.2 fL (ref 80.0–100.0)
Monocytes Absolute: 0.9 10*3/uL (ref 0.1–1.0)
Monocytes Relative: 12 %
Neutro Abs: 4.9 10*3/uL (ref 1.7–7.7)
Neutrophils Relative %: 64 %
Platelets: 132 10*3/uL — ABNORMAL LOW (ref 150–400)
RBC: 3.66 MIL/uL — ABNORMAL LOW (ref 4.22–5.81)
RDW: 12.5 % (ref 11.5–15.5)
WBC: 7.5 10*3/uL (ref 4.0–10.5)
nRBC: 0 % (ref 0.0–0.2)

## 2020-07-28 MED ORDER — DOXYCYCLINE HYCLATE 100 MG PO CAPS: 100.0000 mg | ORAL_CAPSULE | Freq: Two times a day (BID) | ORAL | 0 refills | Status: DC

## 2020-07-28 MED ORDER — HYDROCODONE-ACETAMINOPHEN 5-325 MG PO TABS
1.0000 | ORAL_TABLET | Freq: Once | ORAL | Status: AC
  Administered 2020-07-28: 1 via ORAL
  Filled 2020-07-28: qty 1

## 2020-07-28 MED ORDER — HYDROCODONE-ACETAMINOPHEN 5-325 MG PO TABS: ORAL_TABLET | ORAL | 0 refills | Status: DC

## 2020-07-28 MED ORDER — VANCOMYCIN HCL IN DEXTROSE 1-5 GM/200ML-% IV SOLN
1000.0000 mg | Freq: Once | INTRAVENOUS | Status: AC
  Administered 2020-07-28: 1000 mg via INTRAVENOUS
  Filled 2020-07-28: qty 200

## 2020-07-28 NOTE — ED Provider Notes (Signed)
[facility] Provider Note   CSN: [id number] Arrival date & time: 07/28/20  1311     History Chief Complaint  Patient presents with  . Leg Pain    Joshua [name] is a 79 y.o. male.  HPI     Joshua [name] is a 79 y.o. male with past medical history of hypertension, chronic anticoagulation secondary to atrial fibrillation.  He presents to the Emergency Department complaining of increasing pain, swelling, and redness of his right shin and foot.  He was seen here on Friday, 07/24/2020 after a pickup truck rolled over his right lower leg and foot.  He suffered abrasions to the anterior shin and top of his foot.  X-rays of the tib-fib were negative for fracture, his wounds were cleaned and bandaged.  He states that he is noticed increasing pain, swelling and redness of his lower leg and foot 2 days ago.  He is having difficulty with walking or standing.  He is using a cane.  He has been cleaning the wounds with mild soap and water.  He notes yellow drainage from his foot and lower leg.  He endorses having some chills yesterday.  He denies numbness of his leg or toes, pain of his knee or hip, abdominal pain, fever or vomiting.  Td is up-to-date   Past Medical History:  Diagnosis Date  . 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 BPH (benign prostatic hyperplasia)   . Chronic anticoagulation   . Chronic systolic dysfunction of left ventricle    EF 35%  . Degenerative joint disease    Right knee  . Hyperlipidemia   . Hypertension   . Longstanding persistent atrial fibrillation ([place])   . Nonischemic cardiomyopathy ([place])   . Obesity   . Sleep apnea    Rx-CPAP    Patient Active Problem List   Diagnosis Date Noted  . CAD- minor CAD at cath 03/04/16 03/06/2016  . Acute CHF - secondary to AF with RVR 03/06/2016  . AF (atrial fibrillation) ([place]) 03/04/2016  . Abnormal nuclear stress test   . PAF (paroxysmal atrial fibrillation) ([place])   . Non-ischemic  cardiomyopathy ([place])   . Edema 03/31/2014  . Encounter for therapeutic drug monitoring 12/23/2013  . Knee stiffness 02/06/2013  . Knee pain 02/06/2013  . Difficulty in walking(719.7) 02/06/2013  . Acute blood loss anemia 01/17/2013  . Postop Hyponatremia 01/15/2013  . OA (osteoarthritis) of knee 01/14/2013  . Thrombocytopenia ([place]) 05/26/2011  . Sleep apnea   . Degenerative joint disease   . Chronic anticoagulation 02/02/2011  . Hyperlipidemia 07/29/2010  . Hypertension 12/07/2007    Past Surgical History:  Procedure Laterality Date  . CARDIAC CATHETERIZATION N/A 03/04/2016   Procedure: Left Heart Cath and Coronary Angiography;  Surgeon: Troy Sine, MD;  Location: [facility];  Service: Cardiovascular;  Laterality: N/A;  . CARDIOVERSION N/A 05/30/2016   Procedure: CARDIOVERSION;  Surgeon: Arnoldo Lenis, MD;  Location: [facility];  Service: Endoscopy;  Laterality: N/A;  . CATARACT EXTRACTION W/PHACO Left 01/03/2017   Procedure: CATARACT EXTRACTION PHACO AND INTRAOCULAR LENS PLACEMENT (IOC);  Surgeon: Rutherford Guys, MD;  Location: [facility];  Service: Ophthalmology;  Laterality: Left;  CDE: 5.74  . COLONOSCOPY  03/10/2005  . 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 TOTAL KNEE ARTHROPLASTY Left 03/10/2014   Procedure:  LEFT TOTAL KNEE ARTHROPLASTY;  Surgeon: Gearlean Alf, MD;  [facility];  Service: Orthopedics;  Laterality: Left;  Marland Kitchen VENTRAL HERNIA REPAIR  03/2008, 02/2009       Family History  Problem Relation Age of Onset  . Heart attack Father     Social History   Tobacco Use  . Smoking status: Former Smoker    Types: Cigars    Quit date: 02/21/2018    Years since quitting: 2.4  . Smokeless tobacco: Never Used  . Tobacco comment: Occasional cigar twice a year   Substance Use Topics  . Alcohol use: No    Alcohol/week: 0.0 standard  drinks  . Drug use: No    Home Medications Prior to Admission medications   Medication Sig Start Date End Date Taking? Authorizing Provider  ezetimibe (ZETIA) 10 MG tablet Take 1 tablet by mouth once daily 07/13/20   Arnoldo Lenis, MD  carvedilol (COREG) 25 MG tablet Take 1 tablet by mouth twice daily 04/13/20   Arnoldo Lenis, MD  digoxin (LANOXIN) 0.125 MG tablet Take 1 tablet by mouth once daily 02/24/20   Arnoldo Lenis, MD  fenofibrate (TRICOR) 145 MG tablet Take 1 tablet (145 mg total) by mouth daily. 01/28/20   Arnoldo Lenis, MD  furosemide (LASIX) 40 MG tablet TAKE 1 TABLET BY MOUTH EVERY OTHER DAY 11/18/19   Arnoldo Lenis, MD  lisinopril (ZESTRIL) 20 MG tablet Take 1 tablet by mouth twice daily 03/16/20   Arnoldo Lenis, MD  metFORMIN (GLUCOPHAGE) 500 MG tablet Take 500 mg by mouth 2 (two) times daily with a meal.    [provider]  warfarin (COUMADIN) 10 MG tablet Take as directed by the coumadin clinic 06/05/20   Arnoldo Lenis, MD    Allergies    Patient has no known allergies.  Review of Systems   Review of Systems  Constitutional: Positive for chills. Negative for appetite change and fever.  Respiratory: Negative for chest tightness and shortness of breath.   Cardiovascular: Negative for chest pain.  Gastrointestinal: Negative for abdominal pain, nausea and vomiting.  Genitourinary: Negative for difficulty urinating.  Musculoskeletal: Negative for arthralgias and joint swelling.  Skin: Positive for color change and wound.       Abrasions, redness and swelling of the right lower leg and foot.    Neurological: Negative for weakness, numbness and headaches.  Hematological: Negative for adenopathy.  Psychiatric/Behavioral: Negative for confusion.    Physical Exam Updated Vital Signs BP 115/71 (BP Location: Right Arm)   Pulse 96   Temp 99.4 F (37.4 C) (Oral)   Resp 18   Ht 5\' 10"  (1.778 m)   Wt 102.1 kg   SpO2 92%   BMI 32.28  kg/m   Physical Exam Vitals and nursing note reviewed.  Constitutional:      General: He is not in acute distress.    Appearance: Normal appearance. He is well-developed. He is not toxic-appearing.  HENT:     Head: Atraumatic.     Mouth/Throat:     Mouth: Mucous membranes are dry.  Cardiovascular:     Rate and Rhythm: Normal rate and regular rhythm.     Pulses: Normal pulses.     Comments: Palpable dorsalis pedis pulses bilaterally.  Confirmed with portable Doppler Pulmonary:     Effort: Pulmonary effort is normal. No respiratory distress.     Breath sounds: Normal breath sounds.  Chest:     Chest wall: No  tenderness.  Abdominal:     General: There is no distension.     Palpations: Abdomen is soft.     Tenderness: There is no abdominal tenderness.  Musculoskeletal:        General: Swelling, tenderness and signs of injury present.     Comments: Patient with abrasions of the anterior right lower leg with mild serous oozing.  Abrasions also present to the dorsal and lateral right foot.  Lower extremity is edematous and erythematous.  Compartments are soft.  No posterior tenderness, negative Homans' sign.  Right knee is nontender and without edema or erythema. He maintains FROM. See attached photos.   Skin:    General: Skin is warm.     Capillary Refill: Capillary refill takes less than 2 seconds.     Findings: Erythema and laceration present.  Neurological:     General: No focal deficit present.     Mental Status: He is alert.     Sensory: No sensory deficit.     Motor: No weakness or abnormal muscle tone.     Coordination: Coordination normal.         patient gave verbal consent for images to be stored in the medical record.  ED Results / Procedures / Treatments   Labs (all labs ordered are listed, but only abnormal results are displayed) Labs Reviewed  BASIC METABOLIC PANEL - Abnormal; Notable for the following components:      Result Value   Glucose, Bld 108 (*)     BUN 32 (*)    Creatinine, Ser 1.75 (*)    GFR calc non Af Amer 36 (*)    All other components within normal limits  CBC WITH DIFFERENTIAL/PLATELET - Abnormal; Notable for the following components:   RBC 3.66 (*)    Hemoglobin 11.7 (*)    HCT 35.2 (*)    Platelets 132 (*)    All other components within normal limits    EKG None  Radiology DG Foot Complete Right  Result Date: 07/28/2020 CLINICAL DATA:  Crush injury 4 days ago. No swelling to entire R foot and wound to lateral R foot. EXAM: RIGHT FOOT COMPLETE - 3+ VIEW COMPARISON:  None. FINDINGS: There is no evidence of acute displaced fracture or dislocation. Periosteal reaction along the second through fifth metatarsals likely related to prior injury. There is no evidence of arthropathy or aggressive appearing focal bone abnormality. Subcutaneus soft tissue edema of the dorsum of the right foot. IMPRESSION: No acute displaced fracture or dislocation. Electronically Signed   By: Iven Finn M.D.   On: 07/28/2020 15:11    Procedures Procedures (including critical care time)  Medications Ordered in ED Medications  HYDROcodone-acetaminophen (NORCO/VICODIN) 5-325 MG per tablet 1 tablet (has no administration in time range)    ED Course  I have reviewed the triage vital signs and the nursing notes.  Pertinent labs & imaging results that were available during my care of the patient were reviewed by me and considered in my medical decision making (see chart for details).    MDM Rules/Calculators/A&P                          Patient here for wound recheck of his right lower leg.  He was seen here on 07/24/2020 for abrasions to his lower leg and foot secondary to a crush injury of the foot and lower leg.  Neurovascularly intact.  Sensation intact.  compartments are soft.  Patient  is well-appearing and nontoxic.  Afebrile.  He has multiple weeping abrasions of the lower leg with significant surrounding erythema and  warmth.  Patient with definite wound infection/cellulitis no abscess on exam.  Patient well-appearing, doubt sepsis.  He has been able to bear weight on the extremity.  I have offered admission, but patient declines stating that he prefers to try outpatient therapy first.  He does agree to return in 24 to 48 hours if worsening or no improvement.  On recheck, patient has received IV antibiotics and reports feeling better. wounds bandaged he has a cane to use for ambulation  Kidney functions today are elevated.  Patient does admit that he has not been drinking fluids recently.  This may likely be related to dehydration.  Discussed this in length with patient, he agrees to increase his water intake and will have kidney functions rechecked by PCP.  Prescription for antibiotic written.  Final Clinical Impression(s) / ED Diagnoses Final diagnoses:  Cellulitis of right lower extremity    Rx / DC Orders ED Discharge Orders    None       Bufford Lope 07/29/20 1535    Carmin Muskrat, MD 07/29/20 1652

## 2020-07-28 NOTE — ED Triage Notes (Signed)
Pt has a wound to the right leg from the knee to top of his foot. Redness, swelling and drainage noted.

## 2020-07-28 NOTE — ED Triage Notes (Signed)
Pt has wound to RT lower leg from 07/24/20.  Area is swollen, red and pt reports severe pain.

## 2020-07-28 NOTE — ED Notes (Signed)
Patient is being discharged from the Urgent Care and sent to the Emergency Department via pov . Per Lollie Sails, patient is in need of higher level of care due to evaluation of wound. Patient is aware and verbalizes understanding of plan of care.  Vitals:   07/28/20 1257  BP: 95/62  Pulse: 76  Resp: 19  Temp: 99.9 F (37.7 C)  SpO2: 92%

## 2020-07-28 NOTE — ED Notes (Signed)
Leg dressed with xeroform and telfa

## 2020-07-28 NOTE — Discharge Instructions (Addendum)
Elevate your leg when possible. Use your cane for walking or weightbearing. Keep the wounds clean with mild soap and water. You may bandage with a nonstick bandages as needed. Your wounds will need to be rechecked in 24 to 48 hours. Return here for any worsening symptoms. Also, your kidney functions today are elevated. This can be a result of dehydration. Is important that you drink plenty of water for the next several days. Your primary care provider can order blood work to recheck your kidney functions.

## 2020-08-06 DIAGNOSIS — Z23 Encounter for immunization: Secondary | ICD-10-CM | POA: Diagnosis not present

## 2020-08-06 DIAGNOSIS — S8991XA Unspecified injury of right lower leg, initial encounter: Secondary | ICD-10-CM | POA: Diagnosis not present

## 2020-08-07 DIAGNOSIS — N183 Chronic kidney disease, stage 3 unspecified: Secondary | ICD-10-CM | POA: Diagnosis not present

## 2020-08-07 DIAGNOSIS — I5022 Chronic systolic (congestive) heart failure: Secondary | ICD-10-CM | POA: Diagnosis not present

## 2020-08-07 DIAGNOSIS — D696 Thrombocytopenia, unspecified: Secondary | ICD-10-CM | POA: Diagnosis not present

## 2020-08-07 DIAGNOSIS — E1129 Type 2 diabetes mellitus with other diabetic kidney complication: Secondary | ICD-10-CM | POA: Diagnosis not present

## 2020-08-10 DIAGNOSIS — S91301A Unspecified open wound, right foot, initial encounter: Secondary | ICD-10-CM | POA: Diagnosis not present

## 2020-08-14 DIAGNOSIS — R7309 Other abnormal glucose: Secondary | ICD-10-CM | POA: Diagnosis not present

## 2020-08-14 DIAGNOSIS — N183 Chronic kidney disease, stage 3 unspecified: Secondary | ICD-10-CM | POA: Diagnosis not present

## 2020-08-14 DIAGNOSIS — E1129 Type 2 diabetes mellitus with other diabetic kidney complication: Secondary | ICD-10-CM | POA: Diagnosis not present

## 2020-08-14 DIAGNOSIS — E785 Hyperlipidemia, unspecified: Secondary | ICD-10-CM | POA: Diagnosis not present

## 2020-08-21 DIAGNOSIS — S91301A Unspecified open wound, right foot, initial encounter: Secondary | ICD-10-CM | POA: Diagnosis not present

## 2020-08-22 ENCOUNTER — Other Ambulatory Visit: Payer: Self-pay | Admitting: Cardiology

## 2020-09-01 ENCOUNTER — Ambulatory Visit (INDEPENDENT_AMBULATORY_CARE_PROVIDER_SITE_OTHER): Payer: Medicare Other | Admitting: *Deleted

## 2020-09-01 DIAGNOSIS — Z0000 Encounter for general adult medical examination without abnormal findings: Secondary | ICD-10-CM | POA: Diagnosis not present

## 2020-09-01 DIAGNOSIS — Z5181 Encounter for therapeutic drug level monitoring: Secondary | ICD-10-CM | POA: Diagnosis not present

## 2020-09-01 DIAGNOSIS — I4891 Unspecified atrial fibrillation: Secondary | ICD-10-CM | POA: Diagnosis not present

## 2020-09-01 LAB — POCT INR: INR: 2.7 (ref 2.0–3.0)

## 2020-09-01 NOTE — Patient Instructions (Signed)
Continue warfarin 1/2 tablet daily except 1 tablet on Mondays, Wednesdays and Fridays. Recheck in 8 weeks. 

## 2020-09-03 ENCOUNTER — Telehealth: Payer: Self-pay

## 2020-09-03 ENCOUNTER — Encounter: Payer: Self-pay | Admitting: Plastic Surgery

## 2020-09-03 ENCOUNTER — Ambulatory Visit (INDEPENDENT_AMBULATORY_CARE_PROVIDER_SITE_OTHER): Payer: Medicare Other | Admitting: Plastic Surgery

## 2020-09-03 ENCOUNTER — Other Ambulatory Visit: Payer: Self-pay

## 2020-09-03 VITALS — BP 107/69 | HR 101

## 2020-09-03 DIAGNOSIS — S91301A Unspecified open wound, right foot, initial encounter: Secondary | ICD-10-CM

## 2020-09-03 NOTE — Telephone Encounter (Signed)
Wound/care instructions per Dr. Claudia Desanctis 1) vaseline or antibiotic ointment ( pt has both & can rotate these.) 2) 4x4 gauze dressings or " Mepilex Border type" dressing- pt has these 3) Kerlix/white gauze wrap 4) Ace wrap Change daily Copy of this given to pt & copy scanned into chart Pt does not need these products to be ordered by [facility] at this time- but he understands if he would like for [place] to submit the order to them we can at any time.
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
## 2020-09-04 ENCOUNTER — Institutional Professional Consult (permissible substitution): Payer: Medicare Other | Admitting: Plastic Surgery

## 2020-09-08 ENCOUNTER — Telehealth: Payer: Self-pay | Admitting: *Deleted

## 2020-09-08 NOTE — Telephone Encounter (Signed)
Surgery has been changed to 09/15/20.  INR appt made for 10/01/20.

## 2020-09-08 NOTE — Telephone Encounter (Signed)
   [facility] Pre-operative Risk Assessment     Request for surgical clearance:  1. What type of surgery is being performed? Debridement of right foot wound and placement of primatrix AG mesh    2. When is this surgery scheduled? 09/11/2020   3. What type of clearance is required (medical clearance vs. Pharmacy clearance to hold med vs. Both)? BOTH  4. Are there any medications that need to be held prior to surgery and how long? COUMADIN    5. Practice name and name of physician performing surgery? [facility]  6. What is the office phone number? [phone]   7.   What is the office fax number? [phone]  8.   Anesthesia type (None, local, MAC, general) ? GENERAL    Rennae Ferraiolo T 09/08/2020, 10:12 AM  _________________________________________________________________   (provider comments below)

## 2020-09-08 NOTE — Telephone Encounter (Signed)
   Primary Cardiologist: Carlyle Dolly, MD  Chart reviewed as part of pre-operative protocol coverage. Getting > 4 mets activity. Given past medical history and time since last visit, based on ACC/AHA guidelines, Joshua [name] would be at acceptable risk for the planned procedure without further cardiovascular testing.    Pharmacy to review anticoagulations.   [place], [place] 09/08/2020, 11:21 AM

## 2020-09-08 NOTE — Telephone Encounter (Signed)
Patient with diagnosis of afib on warfarin for anticoagulation.    Procedure: Debridement of right foot wound and placement of primatrix AG mesh   Date of procedure: 09/11/20    CHA2DS2-VASc Score = 6  This indicates a 9.7% annual risk of stroke. The patient's score is based upon: CHF History: 1 HTN History: 1 Diabetes History: 1 Stroke History: 0 Vascular Disease History: 1 Age Score: 2 Gender Score: 0      Per office protocol, patient can hold warfarin for 5 days prior to procedure.   Patient will NOT need bridging with Lovenox (enoxaparin) around procedure.

## 2020-09-09 ENCOUNTER — Encounter: Payer: Self-pay | Admitting: Plastic Surgery

## 2020-09-09 NOTE — Progress Notes (Signed)
Surgical Clearance has been received from Dr. Harl Bowie, [facility].  for patient's upcoming surgery debridement of right foot wound with Dr. Claudia Desanctis .  Medications to hold prior to surgery Warfarin (Stop taking: 5 days prior to surgery (11/18)  Begin retaking 2 days after surgery) Patient will not need bridging with lovenox.

## 2020-09-09 NOTE — H&P (View-Only) (Signed)
Surgical Clearance has been received from Dr. Harl Bowie, [facility].  for patient's upcoming surgery debridement of right foot wound with Dr. Claudia Desanctis .  Medications to hold prior to surgery Warfarin (Stop taking: 5 days prior to surgery (11/18)  Begin retaking 2 days after surgery) Patient will not need bridging with lovenox.

## 2020-09-10 ENCOUNTER — Telehealth: Payer: Self-pay | Admitting: *Deleted

## 2020-09-10 NOTE — Telephone Encounter (Signed)
-----   Message from Threasa Heads, [place] sent at 09/09/2020 10:03 AM EST ----- Regarding: Please notify patient about stopping warfarin prior to surgery Please call patient and let them know to stop taking Warfarin 5 days prior to their surgery (11/18). They can begin retaking it 2 days after surgery (11/25).

## 2020-09-10 NOTE — Telephone Encounter (Signed)
Called and spoke with the patient on yesterday and informed him of the message below.  Patient verbalized understanding and agreed.    He stated that he received a call on Tuesday from the office in [place] as well regarding instructions on stopping the Warfarin.//AB/CMA

## 2020-09-11 ENCOUNTER — Other Ambulatory Visit (HOSPITAL_COMMUNITY)
Admission: RE | Admit: 2020-09-11 | Discharge: 2020-09-11 | Disposition: A | Discharge status: Home / Self Care | Payer: Medicare Other | Source: Ambulatory Visit | Attending: Plastic Surgery | Admitting: Plastic Surgery

## 2020-09-11 DIAGNOSIS — Z20822 Contact with and (suspected) exposure to covid-19: Secondary | ICD-10-CM | POA: Diagnosis not present

## 2020-09-11 DIAGNOSIS — Z01812 Encounter for preprocedural laboratory examination: Secondary | ICD-10-CM | POA: Diagnosis not present

## 2020-09-11 LAB — SARS CORONAVIRUS 2 (TAT 6-24 HRS): SARS Coronavirus 2: NEGATIVE

## 2020-09-14 ENCOUNTER — Encounter (HOSPITAL_COMMUNITY): Payer: Self-pay | Admitting: Plastic Surgery

## 2020-09-14 NOTE — Progress Notes (Signed)
Spoke with Joshua [name] for pre-op call. Joshua [name] has hx of A-fib and is on Coumadin. Dr. Harl Bowie is his cardiologist and Joshua [name] was instructed to hold Coumadin 5 days prior to surgery. Last dose was 09/09/20 pm dose. Joshua [name] states he is borderline diabetic. States he doesn't know what his A1C is and he does not check his blood sugar at home. He states Dr. Asencion Noble checks his A1C. I called Dr. Ria Comment office for the A1C. They are faxing me the result from October.  Covid test done on 09/11/20 and it's negative. Joshua [name] states he's been in quarantine since the test was done and understands that he stays in quarantine until he comes to the hospital.   Chart sent to Anesthesia PA for review.
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
## 2020-09-14 NOTE — Anesthesia Preprocedure Evaluation (Addendum)
Anesthesia Evaluation  Patient identified by MRN, date of birth, ID band Patient awake    Reviewed: Allergy & Precautions, NPO status , Patient's Chart, lab work & pertinent test results, reviewed documented beta blocker date and time   Airway Mallampati: I  TM Distance: >3 FB Neck ROM: Full    Dental no notable dental hx. (+)    Pulmonary sleep apnea and Continuous Positive Airway Pressure Ventilation , former smoker,    Pulmonary exam normal breath sounds clear to auscultation       Cardiovascular hypertension, Pt. on home beta blockers and Pt. on medications + CAD and +CHF  Normal cardiovascular exam+ dysrhythmias Atrial Fibrillation  Rhythm:Regular Rate:Normal  CV: Echo 09/04/19: IMPRESSIONS   1. Left ventricular ejection fraction, by visual estimation, is 40 to  45%. The left ventricle has mild to moderately decreased function. There  is borderline left ventricular hypertrophy.  2. Left ventricular diastolic function could not be evaluated.  3. The left ventricle demonstrates global hypokinesis.  4. Global right ventricle has normal systolic function.The right ventricular size is normal. No increase in right ventricular wall thickness.  5. Left atrial size was massively dilated.  6. Right atrial size was normal.  7. The mitral valve is grossly normal. Moderate mitral valve regurgitation.  8. The tricuspid valve is grossly normal. Tricuspid valve regurgitation is trivial.  9. The aortic valve is tricuspid. Aortic valve regurgitation is not visualized. No evidence of aortic valve sclerosis or stenosis.  10. There is Mild thickening of the aortic valve.  11. The pulmonic valve was grossly normal. Pulmonic valve regurgitation is not visualized.  12. Aortic dilatation noted.  13. 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  14. Normal pulmonary artery systolic pressure.  15. The interatrial septum was  not well visualized.  In comparison to the previous echocardiogram(s): Echocardiogram done 02/18/17 showed an EF of 40%. (EF 30-35% 02/24/16)    Cardiac cath 03/04/16:  Prox LAD lesion, 10% stenosed.  Mid LAD lesion, 20% stenosed.  Ost 3rd Diag lesion, 40% stenosed.  Prox RCA lesion, 20% stenosed. 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 Left ventriculography was not done.  RECOMMENDATION: Medical therapy for his cardiomyopathy with improved rate control of his atrial fibrillation   Neuro/Psych negative neurological ROS  negative psych ROS   GI/Hepatic negative GI ROS, Neg liver ROS,   Endo/Other  negative endocrine ROSdiabetes, Oral Hypoglycemic Agents  Renal/GU negative Renal ROS  negative genitourinary   Musculoskeletal  (+) Arthritis ,   Abdominal   Peds  Hematology negative hematology ROS (+)   Anesthesia Other Findings On coumadin, last dose 09/09/20  Reproductive/Obstetrics                           Anesthesia Physical Anesthesia Plan  ASA: III  Anesthesia Plan: General   Post-op Pain Management:    Induction: Intravenous  PONV Risk Score and Plan: 2 and Ondansetron and Dexamethasone  Airway Management Planned: LMA  Additional Equipment:   Intra-op Plan:   Post-operative Plan: Extubation in OR  Informed Consent: I have reviewed the patients History and Physical, chart, labs and discussed the procedure including the risks, benefits and alternatives for the proposed anesthesia with the patient or authorized representative who has indicated his/her understanding and acceptance.  Dental advisory given  Plan Discussed with: CRNA  Anesthesia Plan Comments: ( )       Anesthesia Quick Evaluation

## 2020-09-15 ENCOUNTER — Ambulatory Visit (HOSPITAL_COMMUNITY)
Admission: RE | Admit: 2020-09-15 | Discharge: 2020-09-15 | Disposition: A | Discharge status: Home / Self Care | Payer: No Typology Code available for payment source | Attending: Plastic Surgery | Admitting: Plastic Surgery

## 2020-09-15 ENCOUNTER — Encounter (HOSPITAL_COMMUNITY): Payer: Self-pay | Admitting: Plastic Surgery

## 2020-09-15 ENCOUNTER — Other Ambulatory Visit: Payer: Self-pay | Admitting: Plastic Surgery

## 2020-09-15 ENCOUNTER — Ambulatory Visit (HOSPITAL_COMMUNITY): Payer: No Typology Code available for payment source | Admitting: Vascular Surgery

## 2020-09-15 ENCOUNTER — Encounter (HOSPITAL_COMMUNITY)
Admission: RE | Disposition: A | Discharge status: Home / Self Care | Payer: Self-pay | Source: Home / Self Care | Attending: Plastic Surgery

## 2020-09-15 ENCOUNTER — Other Ambulatory Visit: Payer: Self-pay

## 2020-09-15 DIAGNOSIS — Z7901 Long term (current) use of anticoagulants: Secondary | ICD-10-CM | POA: Diagnosis not present

## 2020-09-15 DIAGNOSIS — Z96653 Presence of artificial knee joint, bilateral: Secondary | ICD-10-CM | POA: Insufficient documentation

## 2020-09-15 DIAGNOSIS — Z87891 Personal history of nicotine dependence: Secondary | ICD-10-CM | POA: Insufficient documentation

## 2020-09-15 DIAGNOSIS — Z79899 Other long term (current) drug therapy: Secondary | ICD-10-CM | POA: Insufficient documentation

## 2020-09-15 DIAGNOSIS — D62 Acute posthemorrhagic anemia: Secondary | ICD-10-CM | POA: Diagnosis not present

## 2020-09-15 DIAGNOSIS — Z7984 Long term (current) use of oral hypoglycemic drugs: Secondary | ICD-10-CM | POA: Insufficient documentation

## 2020-09-15 DIAGNOSIS — I480 Paroxysmal atrial fibrillation: Secondary | ICD-10-CM | POA: Diagnosis not present

## 2020-09-15 DIAGNOSIS — E785 Hyperlipidemia, unspecified: Secondary | ICD-10-CM | POA: Diagnosis not present

## 2020-09-15 DIAGNOSIS — S91301A Unspecified open wound, right foot, initial encounter: Secondary | ICD-10-CM | POA: Insufficient documentation

## 2020-09-15 HISTORY — DX: Prediabetes: R73.03

## 2020-09-15 HISTORY — DX: Type 2 diabetes mellitus without complications: E11.9

## 2020-09-15 HISTORY — PX: I & D EXTREMITY: SHX5045

## 2020-09-15 HISTORY — PX: APPLICATION OF A-CELL OF EXTREMITY: SHX6303

## 2020-09-15 LAB — CBC
HCT: 42.7 % (ref 39.0–52.0)
Hemoglobin: 13.2 g/dL (ref 13.0–17.0)
MCH: 30.1 pg (ref 26.0–34.0)
MCHC: 30.9 g/dL (ref 30.0–36.0)
MCV: 97.3 fL (ref 80.0–100.0)
Platelets: 169 10*3/uL (ref 150–400)
RBC: 4.39 MIL/uL (ref 4.22–5.81)
RDW: 13.2 % (ref 11.5–15.5)
WBC: 5 10*3/uL (ref 4.0–10.5)
nRBC: 0 % (ref 0.0–0.2)

## 2020-09-15 LAB — GLUCOSE, CAPILLARY
Glucose-Capillary: 110 mg/dL — ABNORMAL HIGH (ref 70–99)
Glucose-Capillary: 96 mg/dL (ref 70–99)
Glucose-Capillary: 93 mg/dL (ref 70–99)

## 2020-09-15 LAB — BASIC METABOLIC PANEL
Anion gap: 12 (ref 5–15)
BUN: 24 mg/dL — ABNORMAL HIGH (ref 8–23)
CO2: 21 mmol/L — ABNORMAL LOW (ref 22–32)
Calcium: 9.2 mg/dL (ref 8.9–10.3)
Chloride: 107 mmol/L (ref 98–111)
Creatinine, Ser: 1.48 mg/dL — ABNORMAL HIGH (ref 0.61–1.24)
GFR, Estimated: 48 mL/min — ABNORMAL LOW (ref >60)
Glucose, Bld: 120 mg/dL — ABNORMAL HIGH (ref 70–99)
Potassium: 4.3 mmol/L (ref 3.5–5.1)
Sodium: 140 mmol/L (ref 135–145)

## 2020-09-15 LAB — PROTIME-INR
INR: 1.2 (ref 0.8–1.2)
Prothrombin Time: 15 seconds (ref 11.4–15.2)

## 2020-09-15 LAB — APTT: aPTT: 26 seconds (ref 24–36)

## 2020-09-15 SURGERY — IRRIGATION AND DEBRIDEMENT EXTREMITY
Anesthesia: General | Site: Foot | Laterality: Right

## 2020-09-15 MED ORDER — PROPOFOL 10 MG/ML IV BOLUS
INTRAVENOUS | Status: AC
  Filled 2020-09-15: qty 40

## 2020-09-15 MED ORDER — LIDOCAINE-EPINEPHRINE 1 %-1:100000 IJ SOLN
INTRAMUSCULAR | Status: AC
  Filled 2020-09-15: qty 1

## 2020-09-15 MED ORDER — BUPIVACAINE HCL (PF) 0.25 % IJ SOLN
INTRAMUSCULAR | Status: DC | PRN
  Administered 2020-09-15: 7.5 mL

## 2020-09-15 MED ORDER — CHLORHEXIDINE GLUCONATE 0.12 % MT SOLN
OROMUCOSAL | Status: AC
  Administered 2020-09-15: 15 mL via OROMUCOSAL
  Filled 2020-09-15: qty 15

## 2020-09-15 MED ORDER — PHENYLEPHRINE HCL-NACL 10-0.9 MG/250ML-% IV SOLN: INTRAVENOUS | Status: DC | PRN

## 2020-09-15 MED ORDER — PROPOFOL 10 MG/ML IV BOLUS
INTRAVENOUS | Status: DC | PRN
  Administered 2020-09-15: 200 mg via INTRAVENOUS

## 2020-09-15 MED ORDER — FENTANYL CITRATE (PF) 250 MCG/5ML IJ SOLN
INTRAMUSCULAR | Status: DC | PRN
  Administered 2020-09-15: 50 ug via INTRAVENOUS

## 2020-09-15 MED ORDER — LIDOCAINE 2% (20 MG/ML) 5 ML SYRINGE
INTRAMUSCULAR | Status: DC | PRN
  Administered 2020-09-15: 100 mg via INTRAVENOUS

## 2020-09-15 MED ORDER — MIDAZOLAM HCL 2 MG/2ML IJ SOLN
INTRAMUSCULAR | Status: AC
  Filled 2020-09-15: qty 2

## 2020-09-15 MED ORDER — HYDROCODONE-ACETAMINOPHEN 5-325 MG PO TABS: 1.0000 | ORAL_TABLET | Freq: Three times a day (TID) | ORAL | 0 refills | Status: AC | PRN

## 2020-09-15 MED ORDER — LIDOCAINE-EPINEPHRINE 1 %-1:100000 IJ SOLN
INTRAMUSCULAR | Status: DC | PRN
  Administered 2020-09-15: 7.5 mL

## 2020-09-15 MED ORDER — FENTANYL CITRATE (PF) 250 MCG/5ML IJ SOLN
INTRAMUSCULAR | Status: AC
  Filled 2020-09-15: qty 5

## 2020-09-15 MED ORDER — SODIUM CHLORIDE 0.9 % IR SOLN
Status: DC | PRN
  Administered 2020-09-15: 1000 mL

## 2020-09-15 MED ORDER — ONDANSETRON HCL 4 MG/2ML IJ SOLN
INTRAMUSCULAR | Status: DC | PRN
  Administered 2020-09-15: 4 mg via INTRAVENOUS

## 2020-09-15 MED ORDER — CHLORHEXIDINE GLUCONATE 0.12 % MT SOLN: 15.0000 mL | Freq: Once | OROMUCOSAL | Status: AC

## 2020-09-15 MED ORDER — DEXAMETHASONE SODIUM PHOSPHATE 10 MG/ML IJ SOLN
INTRAMUSCULAR | Status: DC | PRN
  Administered 2020-09-15: 5 mg via INTRAVENOUS

## 2020-09-15 MED ORDER — PHENYLEPHRINE HCL-NACL 10-0.9 MG/250ML-% IV SOLN
INTRAVENOUS | Status: DC | PRN
  Administered 2020-09-15: 50 ug/min via INTRAVENOUS

## 2020-09-15 MED ORDER — PHENYLEPHRINE 40 MCG/ML (10ML) SYRINGE FOR IV PUSH (FOR BLOOD PRESSURE SUPPORT)
PREFILLED_SYRINGE | INTRAVENOUS | Status: DC | PRN
  Administered 2020-09-15 (×3): 120 ug via INTRAVENOUS

## 2020-09-15 MED ORDER — ORAL CARE MOUTH RINSE: 15.0000 mL | Freq: Once | OROMUCOSAL | Status: AC

## 2020-09-15 MED ORDER — LACTATED RINGERS IV SOLN: INTRAVENOUS | Status: DC

## 2020-09-15 MED ORDER — ACETAMINOPHEN 500 MG PO TABS
1000.0000 mg | ORAL_TABLET | Freq: Once | ORAL | Status: AC
  Administered 2020-09-15: 1000 mg via ORAL
  Filled 2020-09-15: qty 2

## 2020-09-15 MED ORDER — BUPIVACAINE HCL (PF) 0.25 % IJ SOLN
INTRAMUSCULAR | Status: AC
  Filled 2020-09-15: qty 30

## 2020-09-15 MED ORDER — CEFAZOLIN SODIUM-DEXTROSE 2-4 GM/100ML-% IV SOLN
2.0000 g | INTRAVENOUS | Status: AC
  Administered 2020-09-15: 2 g via INTRAVENOUS
  Filled 2020-09-15: qty 100

## 2020-09-15 SURGICAL SUPPLY — 47 items
BNDG CONFORM 2 STRL LF (GAUZE/BANDAGES/DRESSINGS) IMPLANT
BNDG ELASTIC 4X5.8 VLCR STR LF (GAUZE/BANDAGES/DRESSINGS) ×3 IMPLANT
BNDG GAUZE ELAST 4 BULKY (GAUZE/BANDAGES/DRESSINGS) IMPLANT
BRUSH SCRUB EZ PLAIN DRY (MISCELLANEOUS) ×3 IMPLANT
CORD BIPOLAR FORCEPS 12FT (ELECTRODE) IMPLANT
COVER SURGICAL LIGHT HANDLE (MISCELLANEOUS) ×3 IMPLANT
COVER WAND RF STERILE (DRAPES) ×3 IMPLANT
CUFF TOURN SGL QUICK 18X4 (TOURNIQUET CUFF) IMPLANT
CUFF TOURN SGL QUICK 24 (TOURNIQUET CUFF)
CUFF TRNQT CYL 24X4X16.5-23 (TOURNIQUET CUFF) IMPLANT
DRAPE INCISE IOBAN 66X45 STRL (DRAPES) IMPLANT
DRSG ADAPTIC 3X8 NADH LF (GAUZE/BANDAGES/DRESSINGS) IMPLANT
GAUZE SPONGE 4X4 12PLY STRL (GAUZE/BANDAGES/DRESSINGS) ×3 IMPLANT
GAUZE XEROFORM 1X8 LF (GAUZE/BANDAGES/DRESSINGS) ×3 IMPLANT
GLOVE BIO SURGEON STRL SZ7 (GLOVE) ×3 IMPLANT
GLOVE BIO SURGEON STRL SZ8 (GLOVE) ×3 IMPLANT
GLOVE BIOGEL M STRL SZ7.5 (GLOVE) ×3 IMPLANT
GOWN STRL REUS W/ TWL LRG LVL3 (GOWN DISPOSABLE) ×2 IMPLANT
GOWN STRL REUS W/TWL LRG LVL3 (GOWN DISPOSABLE) ×6
KIT BASIN OR (CUSTOM PROCEDURE TRAY) ×3 IMPLANT
KIT TURNOVER KIT B (KITS) ×3 IMPLANT
MANIFOLD NEPTUNE II (INSTRUMENTS) ×3 IMPLANT
NDL HYPO 25GX1X1/2 BEV (NEEDLE) IMPLANT
NEEDLE HYPO 25GX1X1/2 BEV (NEEDLE) IMPLANT
NS IRRIG 1000ML POUR BTL (IV SOLUTION) ×3 IMPLANT
PACK ORTHO EXTREMITY (CUSTOM PROCEDURE TRAY) ×3 IMPLANT
PAD ABD 8X10 STRL (GAUZE/BANDAGES/DRESSINGS) IMPLANT
PAD ARMBOARD 7.5X6 YLW CONV (MISCELLANEOUS) ×3 IMPLANT
PAD CAST 4YDX4 CTTN HI CHSV (CAST SUPPLIES) IMPLANT
PADDING CAST COTTON 4X4 STRL (CAST SUPPLIES)
PRIMATRIX AG 8X8 (Miscellaneous) ×2 IMPLANT
SET CYSTO W/LG BORE CLAMP LF (SET/KITS/TRAYS/PACK) IMPLANT
SOL PREP POV-IOD 4OZ 10% (MISCELLANEOUS) ×6 IMPLANT
SPONGE LAP 4X18 RFD (DISPOSABLE) ×3 IMPLANT
STAPLER VISISTAT 35W (STAPLE) IMPLANT
STOCKINETTE 6  STRL (DRAPES)
STOCKINETTE 6 STRL (DRAPES) IMPLANT
SUT ETHILON 3 0 FSL (SUTURE) IMPLANT
SUT VIC AB 4-0 PS2 18 (SUTURE) IMPLANT
SWAB CULTURE ESWAB REG 1ML (MISCELLANEOUS) IMPLANT
SYR CONTROL 10ML LL (SYRINGE) IMPLANT
TOWEL GREEN STERILE (TOWEL DISPOSABLE) ×3 IMPLANT
TOWEL GREEN STERILE FF (TOWEL DISPOSABLE) ×3 IMPLANT
TUBE CONNECTING 12'X1/4 (SUCTIONS) ×1
TUBE CONNECTING 12X1/4 (SUCTIONS) ×2 IMPLANT
WATER STERILE IRR 1000ML POUR (IV SOLUTION) ×3 IMPLANT
YANKAUER SUCT BULB TIP NO VENT (SUCTIONS) ×3 IMPLANT

## 2020-09-15 NOTE — Transfer of Care (Signed)
Immediate Anesthesia Transfer of Care Note  Patient: Joshua [name]  Procedure(s) Performed: Debridement of right foot wound (Right Foot) placement of primatrix AG mesh (Right Foot)  Patient Location: PACU  Anesthesia Type:General  Level of Consciousness: awake, alert  and drowsy  Airway & Oxygen Therapy: Patient Spontanous Breathing and Patient connected to face mask oxygen  Post-op Assessment: Report given to RN, Post -op Vital signs reviewed and stable and Patient moving all extremities  Post vital signs: Reviewed and stable  Last Vitals:  Vitals Value Taken Time  BP 127/85 09/15/20 1337  Temp    Pulse 86 09/15/20 1338  Resp 22 09/15/20 1338  SpO2 97 % 09/15/20 1338  Vitals shown include unvalidated device data.  Last Pain:  Vitals:   09/15/20 1027  TempSrc:   PainSc: 0-No pain      Patients Stated Pain Goal: 8 (21/03/12 8118)  Complications: No complications documented.

## 2020-09-15 NOTE — Anesthesia Procedure Notes (Signed)
Procedure Name: LMA Insertion Date/Time: 09/15/2020 1:06 PM Performed by: Rande Brunt, CRNA Pre-anesthesia Checklist: Patient identified, Emergency Drugs available, Suction available and Patient being monitored Patient Re-evaluated:Patient Re-evaluated prior to induction Oxygen Delivery Method: Circle System Utilized Preoxygenation: Pre-oxygenation with 100% oxygen Induction Type: IV induction Ventilation: Mask ventilation without difficulty LMA: LMA inserted LMA Size: 4.0 Number of attempts: 1 Airway Equipment and Method: Bite block Placement Confirmation: positive ETCO2 Tube secured with: Tape Dental Injury: Teeth and Oropharynx as per pre-operative assessment

## 2020-09-15 NOTE — Op Note (Signed)
Operative Note   DATE OF OPERATION: 09/15/2020  SURGICAL DEPARTMENT: Plastic Surgery  PREOPERATIVE DIAGNOSES: Right foot wound 8 x 3 cm  POSTOPERATIVE DIAGNOSES:  same  PROCEDURE: 1.  Surgical preparation for grafting of right foot wound totaling 8 x 3 cm 2.  Application of prime matrix AG mesh skin substitute to right foot wound totaling 8 x 3 cm  SURGEON: Talmadge Coventry, MD  ASSISTANT: Phoebe Sharps, PA The advanced practice practitioner (APP) assisted throughout the case.  The APP was essential in retraction and counter traction when needed to make the case progress smoothly.  This retraction and assistance made it possible to see the tissue plans for the procedure.  The assistance was needed for blood control, tissue re-approximation and assisted with closure of the incision site.  ANESTHESIA:  General.   COMPLICATIONS: None.   INDICATIONS FOR PROCEDURE:  The patient, Joshua [name] is a 79 y.o. male born on 10-Jun-1941, is here for treatment of right foot wound. MRN: [id number]  CONSENT:  Informed consent was obtained directly from the patient. Risks, benefits and alternatives were fully discussed. Specific risks including but not limited to bleeding, infection, hematoma, seroma, scarring, pain, contracture, asymmetry, wound healing problems, and need for further surgery were all discussed. The patient did have an ample opportunity to have questions answered to satisfaction.   DESCRIPTION OF PROCEDURE:  The patient was taken to the operating room. SCDs were placed and Ancef antibiotics were given.  General anesthesia was administered.  The patient's operative site was prepped and draped in a sterile fashion. A time out was performed and all information was confirmed to be correct.  By evaluating the wound.  It was 8 x 3 cm and irregular shaped on the dorsal lateral aspect of his foot.  There was fibrinous debris and devitalized tissue at the base.  Start by infiltrating  lidocaine and Marcaine local anesthetic surrounding the wound.  I then excised the border of the skin edges which were rolling in for 1 to 2 mm using a 15 blade.  Bevel of 15 blade was then used to debride the base of the wound of all devitalized tissue.  I then followed this with a Betadine scrub brush for additional debridement.  The wound was then irrigated with over a liter of saline solution.  Hemostasis was obtained.  An 8 x 8 cm piece of pry matrix AG mesh was brought onto the field and soaked in saline.  It was then stapled to the wound.  Ulcer was then fashioned with a scrub brush sponge, Xeroform and skin staples.  Wound was then wrapped with 4 x 4's, Kerlix and Coban.  The patient tolerated the procedure well.  There were no complications. The patient was allowed to wake from anesthesia, extubated and taken to the recovery room in satisfactory condition.

## 2020-09-15 NOTE — Interval H&P Note (Signed)
Patient seen and examined. Risks and benefits discussed. Proceed with surgery today.

## 2020-09-15 NOTE — Progress Notes (Signed)
Orthopedic Tech Progress Note Patient Details:  Joshua [name] 1940-11-10 [id number] PACU RN called requesting a POST OP SHOE for patient Ortho Devices Type of Ortho Device: Postop shoe/boot Ortho Device/Splint Location: RLE Ortho Device/Splint Interventions: Application, Adjustment   Post Interventions Patient Tolerated: Well Instructions Provided: Care of device   Janit Pagan 09/15/2020, 3:52 PM

## 2020-09-15 NOTE — Discharge Instructions (Signed)
Activity: As tolerated, but avoid strenuous activity until follow up visit.  Diet: Regular  Wound Care: Keep dressing clean & dry until your post op visit. No showering. Leave bolster in place until follow up visit.  Redress the wound as needed for comfort.  Special Instructions:  Call our office if any unusual problems occur such as pain, excessive bleeding, unrelieved nausea/vomiting, fever &/or chills.  Follow-up appointment: Scheduled for Dec 1.

## 2020-09-15 NOTE — Brief Op Note (Signed)
09/15/2020  1:31 PM  PATIENT:  Joshua [name]  79 y.o. male  PRE-OPERATIVE DIAGNOSIS:  Open Wound Of Right Foot  POST-OPERATIVE DIAGNOSIS:  Open Wound Of Right Foot  PROCEDURE:  Procedure(s) with comments: Debridement of right foot wound (Right) - 1 hour total placement of primatrix AG mesh (Right)  SURGEON:  Surgeon(s) and Role:    * Romolo Sieling, Steffanie Dunn, MD - Primary  PHYSICIAN ASSISTANT: Phoebe Sharps, PA  ASSISTANTS: none   ANESTHESIA:   general  EBL:  10   BLOOD ADMINISTERED:none  DRAINS: none   LOCAL MEDICATIONS USED:  MARCAINE     SPECIMEN:  No Specimen  DISPOSITION OF SPECIMEN:  N/A  COUNTS:  YES  TOURNIQUET:  * No tourniquets in log *  DICTATION: .Dragon Dictation  PLAN OF CARE: Discharge to home after PACU  PATIENT DISPOSITION:  PACU - hemodynamically stable.   Delay start of Pharmacological VTE agent (>24hrs) due to surgical blood loss or risk of bleeding: not applicable

## 2020-09-16 ENCOUNTER — Encounter (HOSPITAL_COMMUNITY): Payer: Self-pay | Admitting: Plastic Surgery

## 2020-09-16 NOTE — Anesthesia Postprocedure Evaluation (Signed)
Anesthesia Post Note  Patient: Joshua [name]  Procedure(s) Performed: Debridement of right foot wound (Right Foot) placement of primatrix AG mesh (Right Foot)     Patient location during evaluation: PACU Anesthesia Type: General Level of consciousness: awake and alert Pain management: pain level controlled Vital Signs Assessment: post-procedure vital signs reviewed and stable Respiratory status: spontaneous breathing, nonlabored ventilation, respiratory function stable and patient connected to nasal cannula oxygen Cardiovascular status: blood pressure returned to baseline and stable Postop Assessment: no apparent nausea or vomiting Anesthetic complications: no   No complications documented.  Last Vitals:  Vitals:   09/15/20 1354 09/15/20 1355  BP:  118/67  Pulse: 86 95  Resp: 19 20  Temp:    SpO2: 97% 98%    Last Pain:  Vitals:   09/15/20 1354  TempSrc:   PainSc: 0-No pain                 Dona Klemann L Beverlyn Mcginness

## 2020-09-18 ENCOUNTER — Other Ambulatory Visit: Payer: Self-pay | Admitting: Cardiology

## 2020-09-23 ENCOUNTER — Ambulatory Visit (INDEPENDENT_AMBULATORY_CARE_PROVIDER_SITE_OTHER): Payer: Medicare Other | Admitting: Plastic Surgery

## 2020-09-23 ENCOUNTER — Other Ambulatory Visit: Payer: Self-pay

## 2020-09-23 ENCOUNTER — Encounter: Payer: Self-pay | Admitting: Plastic Surgery

## 2020-09-23 VITALS — HR 66 | Temp 97.9°F

## 2020-09-23 DIAGNOSIS — S91301A Unspecified open wound, right foot, initial encounter: Secondary | ICD-10-CM

## 2020-09-23 NOTE — Progress Notes (Signed)
Patient is here about a week postop after debridement of his right foot wound and placement of pry matrix AG mesh.  He feels like things are going fine.  He still does have some nerve related symptoms along the foot which were present preoperatively.  On examination I removed his bolster revealing partial incorporation of the mesh.  All the staples were removed.  The wound bed itself appears healthy.  There is minimal surrounding erythema.  There is no purulence.  We will plan to continue with wound care this area.  I recommended Xeroform and a gauze covering.  The patient has been doing this for some time and feels comfortable continuing it.  We will plan to see him again in about 3 weeks to check his progress.  All of his questions were answered.

## 2020-09-25 ENCOUNTER — Other Ambulatory Visit: Payer: Self-pay

## 2020-09-25 ENCOUNTER — Ambulatory Visit (INDEPENDENT_AMBULATORY_CARE_PROVIDER_SITE_OTHER): Payer: Medicare Other | Admitting: Cardiology

## 2020-09-25 ENCOUNTER — Encounter: Payer: Self-pay | Admitting: Cardiology

## 2020-09-25 VITALS — BP 96/60 | HR 72 | Ht 67.0 in | Wt 215.0 lb

## 2020-09-25 DIAGNOSIS — I4891 Unspecified atrial fibrillation: Secondary | ICD-10-CM | POA: Diagnosis not present

## 2020-09-25 DIAGNOSIS — E782 Mixed hyperlipidemia: Secondary | ICD-10-CM

## 2020-09-25 DIAGNOSIS — I10 Essential (primary) hypertension: Secondary | ICD-10-CM

## 2020-09-25 DIAGNOSIS — I5022 Chronic systolic (congestive) heart failure: Secondary | ICD-10-CM | POA: Diagnosis not present

## 2020-09-25 MED ORDER — LISINOPRIL 10 MG PO TABS: 10.0000 mg | ORAL_TABLET | Freq: Every day | ORAL | 3 refills | Status: DC

## 2020-09-25 NOTE — Patient Instructions (Signed)
Medication Instructions:  Decrease Lisinopril to 10 mg daily    *If you need a refill on your cardiac medications before your next appointment, please call your pharmacy*   Lab Work: None today If you have labs (blood work) drawn today and your tests are completely normal, you will receive your results only by: Marland Kitchen MyChart Message (if you have MyChart) OR . A paper copy in the mail If you have any lab test that is abnormal or we need to change your treatment, we will call you to review the results.   Testing/Procedures: None today   Follow-Up: At [facility], you and your health needs are our priority.  As part of our continuing mission to provide you with exceptional heart care, we have created designated Provider Care Teams.  These Care Teams include your primary Cardiologist (physician) and Advanced Practice Providers (APPs -  Physician Assistants and Nurse Practitioners) who all work together to provide you with the care you need, when you need it.  We recommend signing up for the patient portal called "MyChart".  Sign up information is provided on this After Visit Summary.  MyChart is used to connect with patients for Virtual Visits (Telemedicine).  Patients are able to view lab/test results, encounter notes, upcoming appointments, etc.  Non-urgent messages can be sent to your provider as well.   To learn more about what you can do with MyChart, go to [email or web address].    Your next appointment:   6 month(s)  The format for your next appointment:   In Person  Provider:   You may see Carlyle Dolly, MD or one of the following Advanced Practice Providers on your designated Care Team:    Bernerd Pho, PA-C   Ermalinda Barrios, PA-C     Other Instructions None     Thank you for choosing [facility] !

## 2020-09-25 NOTE — Progress Notes (Signed)
Clinical Summary Joshua [name] is a 79 y.o.male seen today for follow up of the following medical problems.  1. Afib - he failed extended course of amiodarone along with prior DCCV 05/30/16. Have been working toward rate control strategy - off dilt due to low LVEF. Remains on coreg.   - not interested in NOACs due to cost.  - denies any palpitations - no bleeding on coumadin  2. Hyperlipidemia - was previously on prava, developed muscle aches. Stopped taking approx December, symptoms resolved. - reports he has tried multiple other statins with Dr Willey Blade -he is on zetiacurrently and tolerating.    -very high TGs in 400s, vascepa was too expensive so started fenofibrate 145mg  daily.  -recent labs with pcp   - 07/2020 TC 122 TG 200 HDL 27 LDL 61    3. HTN -compliant with meds  - pcp lowred lisinopril to 20mg  daily due to low bp's.  - nearly has lost 30 lbs over the last year.    4. Chronic systolic HF - echo 11/7060 LVEF 30-35%, severe basal infeiror hypokinesis. Cannot eval diasotlic function.  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 DM2 - followed by pcp  SH: had both covid shots x 3,    Past Medical History:  Diagnosis Date  . 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 BPH (benign prostatic hyperplasia)   . Chronic anticoagulation   . Chronic systolic dysfunction of left ventricle    EF 35%  . Degenerative joint disease    Right knee  . Diabetes mellitus without complication ([place])   . Hyperlipidemia   . Hypertension   . Longstanding persistent atrial fibrillation ([place])   . Nonischemic cardiomyopathy ([place])   . Obesity   . Pre-diabetes    Metformin  . Sleep apnea    Rx-CPAP     No Known Allergies   Current Outpatient  Medications  Medication Sig Dispense Refill  . carvedilol (COREG) 25 MG tablet Take 1 tablet by mouth twice daily (Patient taking differently: Take 25 mg by mouth 2 (two) times daily. ) 180 tablet 1  . digoxin (LANOXIN) 0.125 MG tablet Take 1 tablet by mouth once daily (Patient taking differently: Take 0.125 mg by mouth daily. ) 90 tablet 1  . diphenhydrAMINE (BENADRYL) 25 MG tablet Take 25 mg by mouth daily as needed for allergies.    Marland Kitchen ezetimibe (ZETIA) 10 MG tablet Take 1 tablet by mouth once daily (Patient taking differently: Take 10 mg by mouth daily. ) 90 tablet 3  . fenofibrate (TRICOR) 145 MG tablet Take 1 tablet (145 mg total) by mouth daily. 90 tablet 2  . furosemide (LASIX) 40 MG tablet TAKE 1 TABLET BY MOUTH EVERY OTHER DAY (Patient taking differently: Take 40 mg by mouth every other day. ) 45 tablet 3  . lisinopril (ZESTRIL) 20 MG tablet Take 1 tablet by mouth twice daily (Patient taking differently: Take 20 mg by mouth 2 (two) times daily. ) 180 tablet 1  . metFORMIN (GLUCOPHAGE) 500 MG tablet Take 500 mg by mouth 2 (two) times daily with a meal.    . warfarin (COUMADIN) 10 MG tablet TAKE 1/2 TO 1 (ONE-HALF TO ONE) TABLET BY MOUTH AS DIRECTED ONCE DAILY BY  THE  COUMADIN  CLINIC 70 tablet  4   No current facility-administered medications for this visit.     Past Surgical History:  Procedure Laterality Date  . APPLICATION OF A-CELL OF EXTREMITY Right 09/15/2020   Procedure: placement of primatrix AG mesh;  Surgeon: Cindra Presume, MD;  Location: [facility];  Service: Plastics;  Laterality: Right;  . CARDIAC CATHETERIZATION N/A 03/04/2016   Procedure: Left Heart Cath and Coronary Angiography;  Surgeon: Troy Sine, MD;  Location: [facility];  Service: Cardiovascular;  Laterality: N/A;  . CARDIOVERSION N/A 05/30/2016   Procedure: CARDIOVERSION;  Surgeon: Arnoldo Lenis, MD;  Location: [facility];  Service: Endoscopy;  Laterality: N/A;  . CATARACT EXTRACTION W/PHACO Left 01/03/2017    Procedure: CATARACT EXTRACTION PHACO AND INTRAOCULAR LENS PLACEMENT (IOC);  Surgeon: [place] Guys, MD;  Location: [facility];  Service: Ophthalmology;  Laterality: Left;  CDE: 5.74  . COLONOSCOPY  03/10/2005  . I & D EXTREMITY Right 09/15/2020   Procedure: Debridement of right foot wound;  Surgeon: Cindra Presume, MD;  Location: [facility];  Service: Plastics;  Laterality: Right;  1 hour total  . 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 TOTAL KNEE ARTHROPLASTY Left 03/10/2014   Procedure: LEFT TOTAL KNEE ARTHROPLASTY;  Surgeon: Gearlean Alf, MD;  Location: [facility];  Service: Orthopedics;  Laterality: Left;  Marland Kitchen VENTRAL HERNIA REPAIR  03/2008, 02/2009     No Known Allergies    Family History  Problem Relation Age of Onset  . Heart attack Father      Social History Joshua [name] reports that he quit smoking about 2 years ago. His smoking use included cigars. He has never used smokeless tobacco. Joshua [name] reports no history of alcohol use.   Review of Systems CONSTITUTIONAL: No weight loss, fever, chills, weakness or fatigue.  HEENT: Eyes: No visual loss, blurred vision, double vision or yellow sclerae.No hearing loss, sneezing, congestion, runny nose or sore throat.  SKIN: No rash or itching.  CARDIOVASCULAR: per hpi RESPIRATORY: No shortness of breath, cough or sputum.  GASTROINTESTINAL: No anorexia, nausea, vomiting or diarrhea. No abdominal pain or blood.  GENITOURINARY: No burning on urination, no polyuria NEUROLOGICAL: No headache, dizziness, syncope, paralysis, ataxia, numbness or tingling in the extremities. No change in bowel or bladder control.  MUSCULOSKELETAL: No muscle, back pain, joint pain or stiffness.  LYMPHATICS: No enlarged nodes. No history of splenectomy.  PSYCHIATRIC: No history of depression or anxiety.  ENDOCRINOLOGIC: No reports  of sweating, cold or heat intolerance. No polyuria or polydipsia.  Marland Kitchen   Physical Examination Today's Vitals   09/25/20 0848  BP: 96/60  Pulse: 72  SpO2: 99%  Weight: 215 lb (97.5 kg)  Height: 5\' 7"  (1.702 m)   Body mass index is 33.67 kg/m.  Gen: resting comfortably, no acute distress HEENT: no scleral icterus, pupils equal round and reactive, no palptable cervical adenopathy,  CV: irreg, no m/r/g, no jvd Resp: Clear to auscultation bilaterally GI: abdomen is soft, non-tender, non-distended, normal bowel sounds, no hepatosplenomegaly MSK: extremities are warm, no edema.  Skin: warm, no rash Neuro:  no focal deficits Psych: appropriate affect   Diagnostic Studies Echo 09/2010: LVEF 55%, mild LVH, mild MR, mod LAE.   02/2016 Echo Study Conclusions  - Left ventricle: The cavity size was moderately dilated. Wall  thickness was at the upper limits of normal. Systolic function  was moderately to severely reduced. The estimated ejection  fraction was in the range of 30% to 35%. Diffuse hypokinesis.  There is severe hypokinesis of the basalinferior myocardium. The  study is not technically sufficient to allow evaluation of LV  diastolic function. - Mitral valve: Mildly thickened leaflets . There was mild  regurgitation. - Left atrium: The atrium was severely dilated. - Right ventricle: The cavity size was mildly dilated. Systolic  function was mildly reduced. - Right atrium: The atrium was severely dilated. - Atrial septum: No defect or patent foramen ovale was identified. - Tricuspid valve: There was trivial regurgitation. Peak RV-RA  gradient (S): 26 mm Hg. - Pericardium, extracardiac: There was no pericardial effusion.  Impressions:  - Upper normal LV wall thickness with moderate chamber dilatation  and LVEF approximately 30-35% in the setting of atrial  fibrillation. LVEF decreased compared to previous study in 2011.  Diffuse hypokinesis, most  prominent in the inferior wall.  Indeterminate diastolic function. Severe biatrial enlargement.  Mildly thickened mitral leaflets with mild mitral regurgitation.  Mildly dilated RV with reduced contraction. Trivial tricuspid  regurgitation, RV-RA gradient 26 mmHg. Unable to assess CVP.  02/2016 Cath  Prox LAD lesion, 10% stenosed.  Mid LAD lesion, 20% stenosed.  Ost 3rd Diag lesion, 40% stenosed.  Prox RCA lesion, 20% stenosed.   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 Left ventriculography was not done.   RECOMMENDATION: Medical therapy for his cardiomyopathy with improved rate control of his atrial fibrillation.    Assessment and Plan  1. Afib - prefers coumadin to NOACs due to cost - no recent symptoms, continue current meds  2. Hyperlipidemia - intolerant to statins, has been on zetia -TGs in 400s on last check. Vascepa too expensive, started on fenofibrate  - with weight loss TGs much improved, continue current medical therapy.   3. HTN -low bp's with his recent weight loss, have been scaling back his bp meds - lower lisinopril further to 10mg  daily.   4. Chronic systolic HF - Aldactone stopped prevoiusly due to hyperkalemia - has not been able to change to entrest due to cost -denies any recent symptoms, due to low bp's have been scaling back meds, lowering lisinopril to 10mg  daily  F/u 6 months      Arnoldo Lenis, M.D.

## 2020-10-01 ENCOUNTER — Ambulatory Visit (INDEPENDENT_AMBULATORY_CARE_PROVIDER_SITE_OTHER): Payer: Medicare Other | Admitting: *Deleted

## 2020-10-01 DIAGNOSIS — Z5181 Encounter for therapeutic drug level monitoring: Secondary | ICD-10-CM

## 2020-10-01 DIAGNOSIS — I4891 Unspecified atrial fibrillation: Secondary | ICD-10-CM | POA: Diagnosis not present

## 2020-10-01 LAB — POCT INR: INR: 2.7 (ref 2.0–3.0)

## 2020-10-01 NOTE — Patient Instructions (Signed)
Continue warfarin 1/2 tablet daily except 1 tablet on Mondays, Wednesdays and Fridays. Recheck in 8 weeks. 

## 2020-10-03 ENCOUNTER — Other Ambulatory Visit: Payer: Self-pay | Admitting: Cardiology

## 2020-10-12 ENCOUNTER — Other Ambulatory Visit: Payer: Self-pay | Admitting: Cardiology

## 2020-10-14 NOTE — Progress Notes (Signed)
Patient is a 80 year old male here for follow-up after undergoing surgical preparation of right foot wound and application of primatrix AG mesh (8 x 3 cm) on 09/15/2020 with Dr. Claudia Desanctis.  At last visit on 12/1, the bolster was removed revealing partial incorporation of the mesh.   ~ 4 weeks PO   Patient reports he is doing well overall.  Denies fever/chills, nausea/vomiting, foot pain.  Wound shows good granulation tissue formation.  No signs of infection.  Wound measures approximately 7 x 2.5 cm.  Dryness of the surrounding skin present. Daily dressing changes consisting of Xeroform and gauze.  May apply Vaseline to surrounding skin for moisture.  Follow-up in 1 month.  Return precautions provided.  Call office with any questions/concerns.

## 2020-10-15 ENCOUNTER — Ambulatory Visit (INDEPENDENT_AMBULATORY_CARE_PROVIDER_SITE_OTHER): Payer: Medicare Other | Admitting: Plastic Surgery

## 2020-10-15 ENCOUNTER — Telehealth: Payer: Self-pay

## 2020-10-15 ENCOUNTER — Other Ambulatory Visit: Payer: Self-pay

## 2020-10-15 ENCOUNTER — Encounter: Payer: Self-pay | Admitting: Plastic Surgery

## 2020-10-15 VITALS — BP 95/69 | HR 74 | Temp 98.4°F

## 2020-10-15 DIAGNOSIS — S91301D Unspecified open wound, right foot, subsequent encounter: Secondary | ICD-10-CM

## 2020-10-15 NOTE — Telephone Encounter (Signed)
Faxed order to [employer]:Roll gauze, xeroform, non stick pads to be apply daily

## 2020-10-19 ENCOUNTER — Telehealth: Payer: Self-pay | Admitting: Plastic Surgery

## 2020-10-19 NOTE — Telephone Encounter (Signed)
Returned Tabitha's call from [employer]. Verified ok to use telfa for the non stick pads. She advised all other items were sent out prior to verification.  Called patient and advised he will receive the xeroform and roll of guaze in a separate order. The Telfa pad should be received soon.  Patient understood and agreed.

## 2020-10-19 NOTE — Telephone Encounter (Signed)
Tabitha with [employer] called to confirm order for patient. She said they usually send telfa non adhesive and she wanted to make sure nothing else was needed or wanted for the patient's order. She can be reached at [phone] to discuss.

## 2020-10-30 DIAGNOSIS — I4891 Unspecified atrial fibrillation: Secondary | ICD-10-CM | POA: Diagnosis not present

## 2020-10-30 DIAGNOSIS — I10 Essential (primary) hypertension: Secondary | ICD-10-CM | POA: Diagnosis not present

## 2020-10-30 DIAGNOSIS — E785 Hyperlipidemia, unspecified: Secondary | ICD-10-CM | POA: Diagnosis not present

## 2020-10-30 DIAGNOSIS — E1129 Type 2 diabetes mellitus with other diabetic kidney complication: Secondary | ICD-10-CM | POA: Diagnosis not present

## 2020-11-07 ENCOUNTER — Other Ambulatory Visit: Payer: Self-pay | Admitting: Cardiology

## 2020-11-18 ENCOUNTER — Ambulatory Visit (INDEPENDENT_AMBULATORY_CARE_PROVIDER_SITE_OTHER): Payer: Medicare Other | Admitting: *Deleted

## 2020-11-18 ENCOUNTER — Other Ambulatory Visit: Payer: Self-pay

## 2020-11-18 DIAGNOSIS — I4891 Unspecified atrial fibrillation: Secondary | ICD-10-CM | POA: Diagnosis not present

## 2020-11-18 DIAGNOSIS — Z5181 Encounter for therapeutic drug level monitoring: Secondary | ICD-10-CM

## 2020-11-18 LAB — POCT INR: INR: 2.4 (ref 2.0–3.0)

## 2020-11-18 NOTE — Patient Instructions (Signed)
Continue warfarin 1/2 tablet daily except 1 tablet on Mondays, Wednesdays and Fridays. Recheck in 8 weeks. 

## 2020-11-20 ENCOUNTER — Ambulatory Visit: Payer: Medicare Other | Admitting: Plastic Surgery

## 2020-11-20 DIAGNOSIS — E1129 Type 2 diabetes mellitus with other diabetic kidney complication: Secondary | ICD-10-CM | POA: Diagnosis not present

## 2020-11-20 DIAGNOSIS — N183 Chronic kidney disease, stage 3 unspecified: Secondary | ICD-10-CM | POA: Diagnosis not present

## 2020-11-20 DIAGNOSIS — R7301 Impaired fasting glucose: Secondary | ICD-10-CM | POA: Diagnosis not present

## 2020-11-20 DIAGNOSIS — I5032 Chronic diastolic (congestive) heart failure: Secondary | ICD-10-CM | POA: Diagnosis not present

## 2020-11-23 ENCOUNTER — Encounter: Payer: Self-pay | Admitting: Cardiology

## 2020-11-26 NOTE — Progress Notes (Signed)
Patient is a 80 yr old male here for follow up after undergoing surgical preparation of right foot wound and application of primatrix AG mesh (8 x 3 cm) on 09/15/20 with Dr. Claudia Desanctis. On 12/1, the bolster was removed revealing partial incorporation of the mesh. At last visit on 12/23, wound was 7 x 2.5 cm with good granulation tissue present.   ~ 10 weeks PO   Patient reports he is doing very well. He reports swelling of the right foot and lower leg by the end of his day. Reports he does not have the swelling on the left side. He reports wound is healing well. He denies fever/chills, nausea/vomiting, pain at the wound site. Wound is healing very nicely; some scabbing present. No signs of infection or drainage. Mild swelling present. Recommend daily application of Vaseline. Keep wound covered with Mepilex border dressing when wearing shoes to prevent rubbing of scab. Recommend compression socks. Elevate his leg when able. May use Ice to help with swelling.  Keep blood sugars well controlled.  Follow-up as needed.  Return precautions provided.  Call office with any questions/concerns.    Pictures were obtained of the patient and placed in the chart with the patient's or guardian's permission.

## 2020-11-27 ENCOUNTER — Encounter: Payer: Self-pay | Admitting: Plastic Surgery

## 2020-11-27 ENCOUNTER — Other Ambulatory Visit: Payer: Self-pay

## 2020-11-27 ENCOUNTER — Ambulatory Visit (INDEPENDENT_AMBULATORY_CARE_PROVIDER_SITE_OTHER): Payer: Medicare Other | Admitting: Plastic Surgery

## 2020-11-27 VITALS — BP 96/65 | HR 69

## 2020-11-27 DIAGNOSIS — S91301D Unspecified open wound, right foot, subsequent encounter: Secondary | ICD-10-CM

## 2020-12-10 DIAGNOSIS — E1142 Type 2 diabetes mellitus with diabetic polyneuropathy: Secondary | ICD-10-CM | POA: Diagnosis not present

## 2020-12-10 DIAGNOSIS — B351 Tinea unguium: Secondary | ICD-10-CM | POA: Diagnosis not present

## 2021-01-04 ENCOUNTER — Other Ambulatory Visit: Payer: Self-pay

## 2021-01-04 ENCOUNTER — Ambulatory Visit (INDEPENDENT_AMBULATORY_CARE_PROVIDER_SITE_OTHER): Payer: Medicare Other | Admitting: *Deleted

## 2021-01-04 DIAGNOSIS — Z5181 Encounter for therapeutic drug level monitoring: Secondary | ICD-10-CM

## 2021-01-04 DIAGNOSIS — I4891 Unspecified atrial fibrillation: Secondary | ICD-10-CM | POA: Diagnosis not present

## 2021-01-04 LAB — POCT INR: INR: 2.4 (ref 2.0–3.0)

## 2021-01-04 NOTE — Patient Instructions (Signed)
Continue warfarin 1/2 tablet daily except 1 tablet on Mondays, Wednesdays and Fridays. Recheck in 8 weeks.

## 2021-02-10 DIAGNOSIS — E1129 Type 2 diabetes mellitus with other diabetic kidney complication: Secondary | ICD-10-CM | POA: Diagnosis not present

## 2021-02-10 DIAGNOSIS — I5022 Chronic systolic (congestive) heart failure: Secondary | ICD-10-CM | POA: Diagnosis not present

## 2021-02-10 DIAGNOSIS — Z79899 Other long term (current) drug therapy: Secondary | ICD-10-CM | POA: Diagnosis not present

## 2021-02-10 DIAGNOSIS — N1832 Chronic kidney disease, stage 3b: Secondary | ICD-10-CM | POA: Diagnosis not present

## 2021-02-18 DIAGNOSIS — I5032 Chronic diastolic (congestive) heart failure: Secondary | ICD-10-CM | POA: Diagnosis not present

## 2021-02-18 DIAGNOSIS — R7309 Other abnormal glucose: Secondary | ICD-10-CM | POA: Diagnosis not present

## 2021-02-18 DIAGNOSIS — E1122 Type 2 diabetes mellitus with diabetic chronic kidney disease: Secondary | ICD-10-CM | POA: Diagnosis not present

## 2021-02-18 DIAGNOSIS — N1832 Chronic kidney disease, stage 3b: Secondary | ICD-10-CM | POA: Diagnosis not present

## 2021-02-19 ENCOUNTER — Other Ambulatory Visit: Payer: Self-pay | Admitting: Cardiology

## 2021-03-01 ENCOUNTER — Ambulatory Visit (INDEPENDENT_AMBULATORY_CARE_PROVIDER_SITE_OTHER): Payer: Medicare Other | Admitting: *Deleted

## 2021-03-01 DIAGNOSIS — Z5181 Encounter for therapeutic drug level monitoring: Secondary | ICD-10-CM

## 2021-03-01 DIAGNOSIS — I4891 Unspecified atrial fibrillation: Secondary | ICD-10-CM | POA: Diagnosis not present

## 2021-03-01 DIAGNOSIS — I480 Paroxysmal atrial fibrillation: Secondary | ICD-10-CM

## 2021-03-01 LAB — POCT INR
INR: 3.4 — AB (ref 2.0–3.0)
INR: 3.4 — AB (ref 2.0–3.0)

## 2021-03-01 NOTE — Patient Instructions (Signed)
Hold warfarin tonight then resume 1/2 tablet daily except 1 tablet on Mondays, Wednesdays and Fridays. Recheck in 6 weeks.

## 2021-04-11 DIAGNOSIS — U071 COVID-19: Secondary | ICD-10-CM | POA: Diagnosis not present

## 2021-04-15 ENCOUNTER — Other Ambulatory Visit: Payer: Self-pay

## 2021-04-15 ENCOUNTER — Ambulatory Visit (INDEPENDENT_AMBULATORY_CARE_PROVIDER_SITE_OTHER): Payer: Medicare Other | Admitting: *Deleted

## 2021-04-15 DIAGNOSIS — Z5181 Encounter for therapeutic drug level monitoring: Secondary | ICD-10-CM

## 2021-04-15 DIAGNOSIS — I4891 Unspecified atrial fibrillation: Secondary | ICD-10-CM | POA: Diagnosis not present

## 2021-04-15 LAB — POCT INR: INR: 3.7 — AB (ref 2.0–3.0)

## 2021-04-15 NOTE — Patient Instructions (Signed)
Hold warfarin tonight then decrease dose to 1/2 tablet daily except 1 tablet on Mondays and Thursdays. Recheck in 6 weeks.

## 2021-04-23 ENCOUNTER — Ambulatory Visit: Payer: Medicare Other | Admitting: Cardiology

## 2021-04-23 ENCOUNTER — Ambulatory Visit (INDEPENDENT_AMBULATORY_CARE_PROVIDER_SITE_OTHER): Payer: Medicare Other | Admitting: Cardiology

## 2021-04-23 ENCOUNTER — Encounter: Payer: Self-pay | Admitting: Cardiology

## 2021-04-23 ENCOUNTER — Other Ambulatory Visit: Payer: Self-pay

## 2021-04-23 VITALS — BP 92/60 | HR 98 | Ht 70.0 in | Wt 216.0 lb

## 2021-04-23 DIAGNOSIS — I10 Essential (primary) hypertension: Secondary | ICD-10-CM | POA: Diagnosis not present

## 2021-04-23 DIAGNOSIS — E782 Mixed hyperlipidemia: Secondary | ICD-10-CM | POA: Diagnosis not present

## 2021-04-23 DIAGNOSIS — I5022 Chronic systolic (congestive) heart failure: Secondary | ICD-10-CM | POA: Diagnosis not present

## 2021-04-23 DIAGNOSIS — I4891 Unspecified atrial fibrillation: Secondary | ICD-10-CM

## 2021-04-23 NOTE — Patient Instructions (Signed)
Medication Instructions:  Your physician recommends that you continue on your current medications as directed. Please refer to the Current Medication list given to you today.  *If you need a refill on your cardiac medications before your next appointment, please call your pharmacy*   Lab Work: We have requested your lab work from your primary care provider If you have labs (blood work) drawn today and your tests are completely normal, you will receive your results only by: [place] (if you have MyChart) OR A paper copy in the mail If you have any lab test that is abnormal or we need to change your treatment, we will call you to review the results.   Testing/Procedures: None   Follow-Up: At [facility], you and your health needs are our priority.  As part of our continuing mission to provide you with exceptional heart care, we have created designated Provider Care Teams.  These Care Teams include your primary Cardiologist (physician) and Advanced Practice Providers (APPs -  Physician Assistants and Nurse Practitioners) who all work together to provide you with the care you need, when you need it.  We recommend signing up for the patient portal called "MyChart".  Sign up information is provided on this After Visit Summary.  MyChart is used to connect with patients for Virtual Visits (Telemedicine).  Patients are able to view lab/test results, encounter notes, upcoming appointments, etc.  Non-urgent messages can be sent to your provider as well.   To learn more about what you can do with MyChart, go to [email or web address].    Your next appointment:   6 month(s)  The format for your next appointment:   In Person  Provider:   Carlyle Dolly, MD   Other Instructions

## 2021-04-23 NOTE — Progress Notes (Addendum)
Clinical Summary Joshua [name] is a 80 y.o.maleseen today for follow up of the following medical problems.    1. Afib - he failed extended course of amiodarone along with prior DCCV 05/30/16. Have been working toward rate control strategy - off dilt due to low LVEF. Remains on coreg.     - not interested in NOACs due to cost.   - no recent palpitations - no bleeding on coumadin   2. Hyperlipidemia - was previously on prava, developed muscle aches. Stopped taking approx December, symptoms resolved. - reports he has tried multiple other statins with Dr Willey Blade - he is on zetia currently and tolerating.        -very high TGs in 400s, vascepa was too expensive so started fenofibrate 145mg  daily.  - 07/2020 TC 122 TG 200 HDL 27 LDL 61 - labs followed by pcp       3. HTN - compliant with meds - compliant with meds, bps typically 90s/60s and asymptomatic     4. Chronic systolic HF - echo 10/2749 LVEF 30-35%, severe basal infeiror hypokinesis. Cannot eval diasotlic function. 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 No LE edema      5. DM2 - followed by pcp     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   BPH (benign prostatic hyperplasia)    Chronic anticoagulation    Chronic systolic dysfunction of left ventricle    EF 35%   Degenerative joint disease    Right knee   Diabetes mellitus without complication (HCC)    Hyperlipidemia    Hypertension    Longstanding persistent atrial fibrillation (HCC)    Nonischemic cardiomyopathy (HCC)    Obesity    Pre-diabetes    Metformin   Sleep apnea    Rx-CPAP     No Known Allergies   Current Outpatient Medications  Medication Sig Dispense Refill   carvedilol (COREG) 25 MG tablet Take 1 tablet (25  mg total) by mouth 2 (two) times daily. 180 tablet 3   digoxin (LANOXIN) 0.125 MG tablet Take 1 tablet by mouth once daily 90 tablet 3   diphenhydrAMINE (BENADRYL) 25 MG tablet Take 25 mg by mouth daily as needed for allergies.     ezetimibe (ZETIA) 10 MG tablet Take 1 tablet by mouth once daily (Patient taking differently: Take 10 mg by mouth daily.) 90 tablet 3   fenofibrate (TRICOR) 145 MG tablet TAKE 1 TABLET BY MOUTH ONCE DAILY (DISCONTINUE  VASCEPA) 90 tablet 3   furosemide (LASIX) 40 MG tablet TAKE 1 TABLET BY MOUTH EVERY OTHER DAY 45 tablet 1   lisinopril (ZESTRIL) 10 MG tablet Take 1 tablet (10 mg total) by mouth daily. 90 tablet 3   metFORMIN (GLUCOPHAGE) 500 MG tablet Take 500 mg by mouth 2 (two) times daily with a meal.     warfarin (COUMADIN) 10 MG tablet TAKE 1/2 TO 1 (ONE-HALF TO ONE) TABLET BY MOUTH AS DIRECTED ONCE DAILY BY  THE  COUMADIN  CLINIC 70 tablet 4   No current facility-administered medications for this visit.     Past Surgical History:  Procedure Laterality Date   APPLICATION OF A-CELL OF EXTREMITY Right 09/15/2020   Procedure: placement of primatrix AG mesh;  Surgeon:  Cindra Presume, MD;  Location: [facility];  Service: Plastics;  Laterality: Right;   CARDIAC CATHETERIZATION N/A 03/04/2016   Procedure: Left Heart Cath and Coronary Angiography;  Surgeon: Troy Sine, MD;  Location: [facility];  Service: Cardiovascular;  Laterality: N/A;   CARDIOVERSION N/A 05/30/2016   Procedure: CARDIOVERSION;  Surgeon: Arnoldo Lenis, MD;  Location: [facility];  Service: Endoscopy;  Laterality: N/A;   CATARACT EXTRACTION W/PHACO Left 01/03/2017   Procedure: CATARACT EXTRACTION PHACO AND INTRAOCULAR LENS PLACEMENT (IOC);  Surgeon: Rutherford Guys, MD;  Location: [facility];  Service: Ophthalmology;  Laterality: Left;  CDE: 5.74   COLONOSCOPY  03/10/2005   I & D EXTREMITY Right 09/15/2020   Procedure: Debridement of right foot wound;  Surgeon: Cindra Presume, MD;  Location: [facility];   Service: Plastics;  Laterality: Right;  1 hour total   TOTAL KNEE ARTHROPLASTY Right 01/14/2013   Procedure: TOTAL KNEE ARTHROPLASTY;  Surgeon: Gearlean Alf, MD;  Location: [facility];  Service: Orthopedics;  Laterality: Right;   TOTAL KNEE ARTHROPLASTY Left 03/10/2014   Procedure: LEFT TOTAL KNEE ARTHROPLASTY;  Surgeon: Gearlean Alf, MD;  Location: [facility];  Service: Orthopedics;  Laterality: Left;   VENTRAL HERNIA REPAIR  03/2008, 02/2009     No Known Allergies    Family History  Problem Relation Age of Onset   Heart attack Father      Social History Joshua [name] reports that he quit smoking about 3 years ago. His smoking use included cigars. He has never used smokeless tobacco. Joshua [name] reports no history of alcohol use.   Review of Systems CONSTITUTIONAL: No weight loss, fever, chills, weakness or fatigue.  HEENT: Eyes: No visual loss, blurred vision, double vision or yellow sclerae.No hearing loss, sneezing, congestion, runny nose or sore throat.  SKIN: No rash or itching.  CARDIOVASCULAR: per hpi RESPIRATORY: No shortness of breath, cough or sputum.  GASTROINTESTINAL: No anorexia, nausea, vomiting or diarrhea. No abdominal pain or blood.  GENITOURINARY: No burning on urination, no polyuria NEUROLOGICAL: No headache, dizziness, syncope, paralysis, ataxia, numbness or tingling in the extremities. No change in bowel or bladder control.  MUSCULOSKELETAL: No muscle, back pain, joint pain or stiffness.  LYMPHATICS: No enlarged nodes. No history of splenectomy.  PSYCHIATRIC: No history of depression or anxiety.  ENDOCRINOLOGIC: No reports of sweating, cold or heat intolerance. No polyuria or polydipsia.  Marland Kitchen   Physical Examination Today's Vitals   04/23/21 0820  BP: 92/60  Pulse: 98  SpO2: 99%  Weight: 216 lb (98 kg)  Height: 5\' 10"  (1.778 m)   Body mass index is 30.99 kg/m.  Gen: resting comfortably, no acute distress HEENT: no scleral icterus, pupils equal round  and reactive, no palptable cervical adenopathy,  CV: irreg, no m/r/g, no jvd Resp: Clear to auscultation bilaterally GI: abdomen is soft, non-tender, non-distended, normal bowel sounds, no hepatosplenomegaly MSK: extremities are warm, no edema.  Skin: warm, no rash Neuro:  no focal deficits Psych: appropriate affect   Diagnostic Studies  Echo 09/2010: LVEF 55%, mild LVH, mild MR, mod LAE.     02/2016 Echo Study Conclusions   - Left ventricle: The cavity size was moderately dilated. Wall   thickness was at the upper limits of normal. Systolic function   was moderately to severely reduced. The estimated ejection   fraction was in the range of 30% to 35%. Diffuse hypokinesis.   There is severe hypokinesis of the basalinferior myocardium. The  study is not technically sufficient to allow evaluation of LV   diastolic function. - Mitral valve: Mildly thickened leaflets . There was mild   regurgitation. - Left atrium: The atrium was severely dilated. - Right ventricle: The cavity size was mildly dilated. Systolic   function was mildly reduced. - Right atrium: The atrium was severely dilated. - Atrial septum: No defect or patent foramen ovale was identified. - Tricuspid valve: There was trivial regurgitation. Peak RV-RA   gradient (S): 26 mm Hg. - Pericardium, extracardiac: There was no pericardial effusion.   Impressions:   - Upper normal LV wall thickness with moderate chamber dilatation   and LVEF approximately 30-35% in the setting of atrial   fibrillation. LVEF decreased compared to previous study in 2011.   Diffuse hypokinesis, most prominent in the inferior wall.   Indeterminate diastolic function. Severe biatrial enlargement.   Mildly thickened mitral leaflets with mild mitral regurgitation.   Mildly dilated RV with reduced contraction. Trivial tricuspid   regurgitation, RV-RA gradient 26 mmHg. Unable to assess CVP.   02/2016 Cath Prox LAD lesion, 10% stenosed. Mid LAD  lesion, 20% stenosed. Ost 3rd Diag lesion, 40% stenosed. Prox RCA lesion, 20% stenosed.     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  Left ventriculography was not done.    RECOMMENDATION: Medical therapy for his cardiomyopathy with improved rate control of his atrial fibrillation.     Assessment and Plan  1. Afib - prefers coumadin to NOACs due to cost - no symptoms, cotinue current meds - ekg today shows rate controlled afib    2. Hyperlipidemia - intolerant to statins, has been on zetia - TGs in 400s on last check. Vascepa too expensive, started on fenofibrate - rquest labs from pcp, continue current meds   3. HTN - low bp's with his recent weight loss, have been scaling back his bp meds - bp at baseine 90s/60s, no symptoms. Continue current regimen    4. Chronic systolic HF - Aldactone stopped prevoiusly due to hyperkalemia - has not been able to change to entresto due to cost -no symptoms - we discussed possibly farxiga, historically new medication costs have been an issue (entresto, NOAC, vacepa, etc). Not interested at this time.        Arnoldo Lenis, M.D.

## 2021-05-07 ENCOUNTER — Other Ambulatory Visit: Payer: Self-pay | Admitting: Cardiology

## 2021-05-18 ENCOUNTER — Ambulatory Visit (INDEPENDENT_AMBULATORY_CARE_PROVIDER_SITE_OTHER): Payer: Medicare Other | Admitting: *Deleted

## 2021-05-18 ENCOUNTER — Other Ambulatory Visit: Payer: Self-pay

## 2021-05-18 DIAGNOSIS — I4891 Unspecified atrial fibrillation: Secondary | ICD-10-CM

## 2021-05-18 DIAGNOSIS — Z5181 Encounter for therapeutic drug level monitoring: Secondary | ICD-10-CM

## 2021-05-18 LAB — POCT INR: INR: 2.5 (ref 2.0–3.0)

## 2021-05-18 NOTE — Patient Instructions (Signed)
Continue warfarin 1/2 tablet daily except 1 tablet on Mondays and Thursdays. Recheck in 6 weeks.

## 2021-05-20 DIAGNOSIS — Z79899 Other long term (current) drug therapy: Secondary | ICD-10-CM | POA: Diagnosis not present

## 2021-05-20 DIAGNOSIS — M1990 Unspecified osteoarthritis, unspecified site: Secondary | ICD-10-CM | POA: Diagnosis not present

## 2021-05-20 DIAGNOSIS — E785 Hyperlipidemia, unspecified: Secondary | ICD-10-CM | POA: Diagnosis not present

## 2021-05-20 DIAGNOSIS — E1129 Type 2 diabetes mellitus with other diabetic kidney complication: Secondary | ICD-10-CM | POA: Diagnosis not present

## 2021-05-20 DIAGNOSIS — I10 Essential (primary) hypertension: Secondary | ICD-10-CM | POA: Diagnosis not present

## 2021-05-20 DIAGNOSIS — G4730 Sleep apnea, unspecified: Secondary | ICD-10-CM | POA: Diagnosis not present

## 2021-05-28 DIAGNOSIS — R7309 Other abnormal glucose: Secondary | ICD-10-CM | POA: Diagnosis not present

## 2021-05-28 DIAGNOSIS — D696 Thrombocytopenia, unspecified: Secondary | ICD-10-CM | POA: Diagnosis not present

## 2021-05-28 DIAGNOSIS — E1122 Type 2 diabetes mellitus with diabetic chronic kidney disease: Secondary | ICD-10-CM | POA: Diagnosis not present

## 2021-05-28 DIAGNOSIS — N1832 Chronic kidney disease, stage 3b: Secondary | ICD-10-CM | POA: Diagnosis not present

## 2021-05-28 DIAGNOSIS — I5022 Chronic systolic (congestive) heart failure: Secondary | ICD-10-CM | POA: Diagnosis not present

## 2021-05-31 DIAGNOSIS — Z23 Encounter for immunization: Secondary | ICD-10-CM | POA: Diagnosis not present

## 2021-07-02 ENCOUNTER — Other Ambulatory Visit: Payer: Self-pay | Admitting: Cardiology

## 2021-07-12 ENCOUNTER — Ambulatory Visit (INDEPENDENT_AMBULATORY_CARE_PROVIDER_SITE_OTHER): Payer: Medicare Other | Admitting: *Deleted

## 2021-07-12 ENCOUNTER — Other Ambulatory Visit: Payer: Self-pay

## 2021-07-12 DIAGNOSIS — I4891 Unspecified atrial fibrillation: Secondary | ICD-10-CM | POA: Diagnosis not present

## 2021-07-12 DIAGNOSIS — Z5181 Encounter for therapeutic drug level monitoring: Secondary | ICD-10-CM

## 2021-07-12 LAB — POCT INR: INR: 2.1 (ref 2.0–3.0)

## 2021-07-12 NOTE — Patient Instructions (Signed)
Continue warfarin 1/2 tablet daily except 1 tablet on Mondays and Thursdays. Recheck in 6 weeks.

## 2021-08-21 DIAGNOSIS — U071 COVID-19: Secondary | ICD-10-CM | POA: Diagnosis not present

## 2021-08-23 ENCOUNTER — Ambulatory Visit (INDEPENDENT_AMBULATORY_CARE_PROVIDER_SITE_OTHER): Payer: Medicare Other | Admitting: *Deleted

## 2021-08-23 ENCOUNTER — Other Ambulatory Visit: Payer: Self-pay

## 2021-08-23 DIAGNOSIS — I4891 Unspecified atrial fibrillation: Secondary | ICD-10-CM

## 2021-08-23 DIAGNOSIS — Z5181 Encounter for therapeutic drug level monitoring: Secondary | ICD-10-CM | POA: Diagnosis not present

## 2021-08-23 LAB — POCT INR: INR: 2.3 (ref 2.0–3.0)

## 2021-08-23 NOTE — Patient Instructions (Signed)
Continue warfarin 1/2 tablet daily except 1 tablet on Mondays and Thursdays. Recheck in 6 weeks.

## 2021-08-24 DIAGNOSIS — Z23 Encounter for immunization: Secondary | ICD-10-CM | POA: Diagnosis not present

## 2021-09-10 DIAGNOSIS — I5022 Chronic systolic (congestive) heart failure: Secondary | ICD-10-CM | POA: Diagnosis not present

## 2021-09-10 DIAGNOSIS — N1832 Chronic kidney disease, stage 3b: Secondary | ICD-10-CM | POA: Diagnosis not present

## 2021-09-10 DIAGNOSIS — E1129 Type 2 diabetes mellitus with other diabetic kidney complication: Secondary | ICD-10-CM | POA: Diagnosis not present

## 2021-09-10 DIAGNOSIS — I482 Chronic atrial fibrillation, unspecified: Secondary | ICD-10-CM | POA: Diagnosis not present

## 2021-09-10 DIAGNOSIS — Z79899 Other long term (current) drug therapy: Secondary | ICD-10-CM | POA: Diagnosis not present

## 2021-09-24 ENCOUNTER — Other Ambulatory Visit: Payer: Self-pay | Admitting: Cardiology

## 2021-09-24 DIAGNOSIS — T466X5A Adverse effect of antihyperlipidemic and antiarteriosclerotic drugs, initial encounter: Secondary | ICD-10-CM | POA: Diagnosis not present

## 2021-09-24 DIAGNOSIS — R7309 Other abnormal glucose: Secondary | ICD-10-CM | POA: Diagnosis not present

## 2021-09-24 DIAGNOSIS — N1832 Chronic kidney disease, stage 3b: Secondary | ICD-10-CM | POA: Diagnosis not present

## 2021-09-24 DIAGNOSIS — E785 Hyperlipidemia, unspecified: Secondary | ICD-10-CM | POA: Diagnosis not present

## 2021-09-24 DIAGNOSIS — E1122 Type 2 diabetes mellitus with diabetic chronic kidney disease: Secondary | ICD-10-CM | POA: Diagnosis not present

## 2021-10-03 ENCOUNTER — Other Ambulatory Visit: Payer: Self-pay | Admitting: Cardiology

## 2021-10-04 ENCOUNTER — Ambulatory Visit (INDEPENDENT_AMBULATORY_CARE_PROVIDER_SITE_OTHER): Payer: Medicare Other | Admitting: *Deleted

## 2021-10-04 DIAGNOSIS — Z5181 Encounter for therapeutic drug level monitoring: Secondary | ICD-10-CM

## 2021-10-04 DIAGNOSIS — I4891 Unspecified atrial fibrillation: Secondary | ICD-10-CM

## 2021-10-04 LAB — POCT INR: INR: 2.4 (ref 2.0–3.0)

## 2021-10-04 NOTE — Patient Instructions (Signed)
Continue warfarin 1/2 tablet daily except 1 tablet on Mondays and Thursdays. Recheck in 6 weeks.

## 2021-10-09 ENCOUNTER — Other Ambulatory Visit: Payer: Self-pay | Admitting: Cardiology

## 2021-10-24 ENCOUNTER — Other Ambulatory Visit: Payer: Self-pay | Admitting: Cardiology

## 2021-11-04 ENCOUNTER — Other Ambulatory Visit: Payer: Self-pay | Admitting: Cardiology

## 2021-11-18 ENCOUNTER — Ambulatory Visit (INDEPENDENT_AMBULATORY_CARE_PROVIDER_SITE_OTHER): Payer: Medicare Other | Admitting: *Deleted

## 2021-11-18 DIAGNOSIS — I4891 Unspecified atrial fibrillation: Secondary | ICD-10-CM

## 2021-11-18 DIAGNOSIS — Z5181 Encounter for therapeutic drug level monitoring: Secondary | ICD-10-CM | POA: Diagnosis not present

## 2021-11-18 LAB — POCT INR: INR: 2.4 (ref 2.0–3.0)

## 2021-11-18 NOTE — Patient Instructions (Signed)
Continue warfarin 1/2 tablet daily except 1 tablet on Mondays and Thursdays. Recheck in 6 weeks.

## 2021-12-21 ENCOUNTER — Encounter: Payer: Self-pay | Admitting: *Deleted

## 2021-12-24 DIAGNOSIS — I5022 Chronic systolic (congestive) heart failure: Secondary | ICD-10-CM | POA: Diagnosis not present

## 2021-12-24 DIAGNOSIS — Z79899 Other long term (current) drug therapy: Secondary | ICD-10-CM | POA: Diagnosis not present

## 2021-12-24 DIAGNOSIS — N1832 Chronic kidney disease, stage 3b: Secondary | ICD-10-CM | POA: Diagnosis not present

## 2021-12-24 DIAGNOSIS — E785 Hyperlipidemia, unspecified: Secondary | ICD-10-CM | POA: Diagnosis not present

## 2021-12-24 DIAGNOSIS — E1129 Type 2 diabetes mellitus with other diabetic kidney complication: Secondary | ICD-10-CM | POA: Diagnosis not present

## 2021-12-31 ENCOUNTER — Other Ambulatory Visit: Payer: Self-pay | Admitting: Cardiology

## 2021-12-31 ENCOUNTER — Ambulatory Visit (INDEPENDENT_AMBULATORY_CARE_PROVIDER_SITE_OTHER): Payer: Medicare Other | Admitting: *Deleted

## 2021-12-31 ENCOUNTER — Other Ambulatory Visit: Payer: Self-pay

## 2021-12-31 DIAGNOSIS — Z5181 Encounter for therapeutic drug level monitoring: Secondary | ICD-10-CM | POA: Diagnosis not present

## 2021-12-31 DIAGNOSIS — I4891 Unspecified atrial fibrillation: Secondary | ICD-10-CM

## 2021-12-31 DIAGNOSIS — N1832 Chronic kidney disease, stage 3b: Secondary | ICD-10-CM | POA: Diagnosis not present

## 2021-12-31 DIAGNOSIS — E1122 Type 2 diabetes mellitus with diabetic chronic kidney disease: Secondary | ICD-10-CM | POA: Diagnosis not present

## 2021-12-31 DIAGNOSIS — I5022 Chronic systolic (congestive) heart failure: Secondary | ICD-10-CM | POA: Diagnosis not present

## 2021-12-31 DIAGNOSIS — R7309 Other abnormal glucose: Secondary | ICD-10-CM | POA: Diagnosis not present

## 2021-12-31 DIAGNOSIS — D6869 Other thrombophilia: Secondary | ICD-10-CM | POA: Diagnosis not present

## 2021-12-31 LAB — POCT INR: INR: 2.5 (ref 2.0–3.0)

## 2021-12-31 NOTE — Patient Instructions (Signed)
Continue warfarin 1/2 tablet daily except 1 tablet on Mondays and Thursdays. Recheck in 6 weeks. ?

## 2022-02-03 ENCOUNTER — Other Ambulatory Visit: Payer: Self-pay | Admitting: Cardiology

## 2022-02-11 ENCOUNTER — Other Ambulatory Visit: Payer: Self-pay | Admitting: Cardiology

## 2022-02-21 ENCOUNTER — Ambulatory Visit (INDEPENDENT_AMBULATORY_CARE_PROVIDER_SITE_OTHER): Payer: Medicare Other | Admitting: *Deleted

## 2022-02-21 DIAGNOSIS — I4891 Unspecified atrial fibrillation: Secondary | ICD-10-CM | POA: Diagnosis not present

## 2022-02-21 DIAGNOSIS — Z5181 Encounter for therapeutic drug level monitoring: Secondary | ICD-10-CM | POA: Diagnosis not present

## 2022-02-21 LAB — POCT INR: INR: 2.5 (ref 2.0–3.0)

## 2022-02-21 NOTE — Patient Instructions (Signed)
Continue warfarin 1/2 tablet daily except 1 tablet on Mondays and Thursdays. Recheck in 6 weeks. ?

## 2022-03-18 ENCOUNTER — Other Ambulatory Visit: Payer: Self-pay | Admitting: Cardiology

## 2022-04-04 ENCOUNTER — Ambulatory Visit (INDEPENDENT_AMBULATORY_CARE_PROVIDER_SITE_OTHER): Payer: Medicare Other | Admitting: *Deleted

## 2022-04-04 ENCOUNTER — Encounter: Payer: Self-pay | Admitting: Cardiology

## 2022-04-04 ENCOUNTER — Ambulatory Visit (INDEPENDENT_AMBULATORY_CARE_PROVIDER_SITE_OTHER): Payer: Medicare Other | Admitting: Cardiology

## 2022-04-04 VITALS — BP 120/64 | HR 99 | Ht 70.0 in | Wt 214.0 lb

## 2022-04-04 DIAGNOSIS — E782 Mixed hyperlipidemia: Secondary | ICD-10-CM

## 2022-04-04 DIAGNOSIS — D6869 Other thrombophilia: Secondary | ICD-10-CM

## 2022-04-04 DIAGNOSIS — N1832 Chronic kidney disease, stage 3b: Secondary | ICD-10-CM | POA: Diagnosis not present

## 2022-04-04 DIAGNOSIS — I10 Essential (primary) hypertension: Secondary | ICD-10-CM

## 2022-04-04 DIAGNOSIS — I4891 Unspecified atrial fibrillation: Secondary | ICD-10-CM | POA: Diagnosis not present

## 2022-04-04 DIAGNOSIS — Z5181 Encounter for therapeutic drug level monitoring: Secondary | ICD-10-CM | POA: Diagnosis not present

## 2022-04-04 DIAGNOSIS — I4821 Permanent atrial fibrillation: Secondary | ICD-10-CM | POA: Diagnosis not present

## 2022-04-04 DIAGNOSIS — Z79899 Other long term (current) drug therapy: Secondary | ICD-10-CM | POA: Diagnosis not present

## 2022-04-04 DIAGNOSIS — I5022 Chronic systolic (congestive) heart failure: Secondary | ICD-10-CM | POA: Diagnosis not present

## 2022-04-04 LAB — POCT INR: INR: 2 (ref 2.0–3.0)

## 2022-04-04 MED ORDER — EZETIMIBE 10 MG PO TABS: 10.0000 mg | ORAL_TABLET | Freq: Every day | ORAL | 3 refills | Status: DC

## 2022-04-04 MED ORDER — CARVEDILOL 25 MG PO TABS: 25.0000 mg | ORAL_TABLET | Freq: Two times a day (BID) | ORAL | 3 refills | Status: DC

## 2022-04-04 NOTE — Progress Notes (Signed)
Clinical Summary Joshua [name] is a 81 y.o.male seen today for follow up of the following medical problems.    1. Afib - he failed extended course of amiodarone along with prior DCCV 05/30/16. Have been working toward rate control strategy - off dilt due to low LVEF. Remains on coreg.     - not interested in NOACs due to cost.     - no recent palpitations - compliant with meds. No bleeding on coumadin.      2. Hyperlipidemia - was previously on prava, developed muscle aches. Stopped taking approx December, symptoms resolved. - reports he has tried multiple other statins with Dr Willey Blade - he is on zetia currently and tolerating.        -very high TGs in 400s, vascepa was too expensive so started fenofibrate '145mg'$  daily.  - 07/2020 TC 122 TG 200 HDL 27 LDL 61 08/2021 TC 158 TG 335 HDL 28 LDL 76 - had labs with pcp today        3. HTN - he is compliant with meds     4. Chronic systolic HF - echo 0/5397 LVEF 30-35%, severe basal infeiror hypokinesis. Cannot eval diasotlic function. 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        5. DM2 - followed by pcp       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   BPH (benign prostatic hyperplasia)    Chronic anticoagulation    Chronic systolic dysfunction of left ventricle    EF 35%   Degenerative joint disease    Right knee   Diabetes mellitus without complication (HCC)    Hyperlipidemia    Hypertension    Longstanding persistent atrial fibrillation (HCC)    Nonischemic cardiomyopathy (HCC)    Obesity    Pre-diabetes    Metformin   Sleep apnea    Rx-CPAP     No Known Allergies   Current Outpatient Medications  Medication Sig Dispense Refill   fenofibrate (TRICOR) 145 MG  tablet TAKE 1 TABLET BY MOUTH ONCE DAILY (DISCONTINUE  VASCEPA) 90 tablet 3   warfarin (COUMADIN) 10 MG tablet TAKE 1/2 TO 1 (ONE-HALF TO ONE) TABLET BY MOUTH AS DIRECTED ONCE DAILY BY THE COUMADIN CLINIC 70 tablet 3   carvedilol (COREG) 25 MG tablet Take 1 tablet by mouth twice daily 180 tablet 1   digoxin (LANOXIN) 0.125 MG tablet Take 1 tablet by mouth once daily 90 tablet 1   diphenhydrAMINE (BENADRYL) 25 MG tablet Take 25 mg by mouth daily as needed for allergies.     ezetimibe (ZETIA) 10 MG tablet Take 1 tablet by mouth once daily 90 tablet 1   furosemide (LASIX) 40 MG tablet TAKE 1 TABLET BY MOUTH EVERY OTHER DAY 45 tablet 3   lisinopril (ZESTRIL) 10 MG tablet Take 1 tablet by mouth once daily 90 tablet 1   metFORMIN (GLUCOPHAGE) 500 MG tablet Take 500 mg by mouth 2 (two) times daily with a meal.     No current facility-administered medications for this visit.     Past Surgical History:  Procedure Laterality Date   APPLICATION OF A-CELL OF EXTREMITY Right 09/15/2020   Procedure: placement of primatrix AG mesh;  Surgeon: Cindra Presume,  MD;  Location: [facility];  Service: Plastics;  Laterality: Right;   CARDIAC CATHETERIZATION N/A 03/04/2016   Procedure: Left Heart Cath and Coronary Angiography;  Surgeon: Troy Sine, MD;  Location: [facility];  Service: Cardiovascular;  Laterality: N/A;   CARDIOVERSION N/A 05/30/2016   Procedure: CARDIOVERSION;  Surgeon: Arnoldo Lenis, MD;  Location: [facility];  Service: Endoscopy;  Laterality: N/A;   CATARACT EXTRACTION W/PHACO Left 01/03/2017   Procedure: CATARACT EXTRACTION PHACO AND INTRAOCULAR LENS PLACEMENT (IOC);  Surgeon: Rutherford Guys, MD;  Location: [facility];  Service: Ophthalmology;  Laterality: Left;  CDE: 5.74   COLONOSCOPY  03/10/2005   I & D EXTREMITY Right 09/15/2020   Procedure: Debridement of right foot wound;  Surgeon: Cindra Presume, MD;  Location: [facility];  Service: Plastics;  Laterality: Right;  1 hour total   TOTAL KNEE  ARTHROPLASTY Right 01/14/2013   Procedure: TOTAL KNEE ARTHROPLASTY;  Surgeon: Gearlean Alf, MD;  Location: [facility];  Service: Orthopedics;  Laterality: Right;   TOTAL KNEE ARTHROPLASTY Left 03/10/2014   Procedure: LEFT TOTAL KNEE ARTHROPLASTY;  Surgeon: Gearlean Alf, MD;  Location: [facility];  Service: Orthopedics;  Laterality: Left;   VENTRAL HERNIA REPAIR  03/2008, 02/2009     No Known Allergies    Family History  Problem Relation Age of Onset   Heart attack Father      Social History Mr. Kmetz reports that he quit smoking about 4 years ago. His smoking use included cigars. He has never used smokeless tobacco. Mr. Staiger reports no history of alcohol use.   Review of Systems CONSTITUTIONAL: No weight loss, fever, chills, weakness or fatigue.  HEENT: Eyes: No visual loss, blurred vision, double vision or yellow sclerae.No hearing loss, sneezing, congestion, runny nose or sore throat.  SKIN: No rash or itching.  CARDIOVASCULAR: per hpi RESPIRATORY: No shortness of breath, cough or sputum.  GASTROINTESTINAL: No anorexia, nausea, vomiting or diarrhea. No abdominal pain or blood.  GENITOURINARY: No burning on urination, no polyuria NEUROLOGICAL: No headache, dizziness, syncope, paralysis, ataxia, numbness or tingling in the extremities. No change in bowel or bladder control.  MUSCULOSKELETAL: No muscle, back pain, joint pain or stiffness.  LYMPHATICS: No enlarged nodes. No history of splenectomy.  PSYCHIATRIC: No history of depression or anxiety.  ENDOCRINOLOGIC: No reports of sweating, cold or heat intolerance. No polyuria or polydipsia.  Marland Kitchen   Physical Examination Today's Vitals   04/04/22 1522  BP: 120/64  Pulse: 99  SpO2: 98%  Weight: 214 lb (97.1 kg)  Height: '5\' 10"'$  (1.778 m)   Body mass index is 30.71 kg/m.  Gen: resting comfortably, no acute distress HEENT: no scleral icterus, pupils equal round and reactive, no palptable cervical adenopathy,  CV: RRR, no  m/r/g no jvd Resp: Clear to auscultation bilaterally GI: abdomen is soft, non-tender, non-distended, normal bowel sounds, no hepatosplenomegaly MSK: extremities are warm, no edema.  Skin: warm, no rash Neuro:  no focal deficits Psych: appropriate affect   Diagnostic Studies    Echo 09/2010: LVEF 55%, mild LVH, mild MR, mod LAE.     02/2016 Echo Study Conclusions   - Left ventricle: The cavity size was moderately dilated. Wall   thickness was at the upper limits of normal. Systolic function   was moderately to severely reduced. The estimated ejection   fraction was in the range of 30% to 35%. Diffuse hypokinesis.   There is severe hypokinesis of the basalinferior myocardium. The   study  is not technically sufficient to allow evaluation of LV   diastolic function. - Mitral valve: Mildly thickened leaflets . There was mild   regurgitation. - Left atrium: The atrium was severely dilated. - Right ventricle: The cavity size was mildly dilated. Systolic   function was mildly reduced. - Right atrium: The atrium was severely dilated. - Atrial septum: No defect or patent foramen ovale was identified. - Tricuspid valve: There was trivial regurgitation. Peak RV-RA   gradient (S): 26 mm Hg. - Pericardium, extracardiac: There was no pericardial effusion.   Impressions:   - Upper normal LV wall thickness with moderate chamber dilatation   and LVEF approximately 30-35% in the setting of atrial   fibrillation. LVEF decreased compared to previous study in 2011.   Diffuse hypokinesis, most prominent in the inferior wall.   Indeterminate diastolic function. Severe biatrial enlargement.   Mildly thickened mitral leaflets with mild mitral regurgitation.   Mildly dilated RV with reduced contraction. Trivial tricuspid   regurgitation, RV-RA gradient 26 mmHg. Unable to assess CVP.   02/2016 Cath Prox LAD lesion, 10% stenosed. Mid LAD lesion, 20% stenosed. Ost 3rd Diag lesion, 40%  stenosed. Prox RCA lesion, 20% stenosed.     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  Left ventriculography was not done.    RECOMMENDATION: Medical therapy for his cardiomyopathy with improved rate control of his atrial fibrillation.     Assessment and Plan   1. Afib/acquired thrombophilia - prefers coumadin to NOACs due to cost - no symptoms, we will cotinue current meds    2. Hyperlipidemia - intolerant to statins, has been on zetia - TGs in 400s on last check. Vascepa too expensive, started on fenofibrate - updated labs with pcp today, we will f/u results   3. HTN -at goal,continue current meds    4. Chronic systolic HF - Aldactone stopped prevoiusly due to hyperkalemia - has not been able to change to entresto due to cost - we discussed possibly farxiga, historically new medication costs have been an issue (entresto, NOAC, vacepa, etc). He has not been interested in medication changes. Since no med changes we have not repeated echo.      Arnoldo Lenis, M.D.

## 2022-04-04 NOTE — Patient Instructions (Signed)
Continue warfarin 1/2 tablet daily except 1 tablet on Mondays and Thursdays. Recheck in 6 weeks.

## 2022-04-04 NOTE — Patient Instructions (Signed)
Medication Instructions:  Continue all current medications.   Labwork: none  Testing/Procedures: none  Follow-Up: 6 months   Any Other Special Instructions Will Be Listed Below (If Applicable).   If you need a refill on your cardiac medications before your next appointment, please call your pharmacy.  

## 2022-04-11 DIAGNOSIS — G4733 Obstructive sleep apnea (adult) (pediatric): Secondary | ICD-10-CM | POA: Diagnosis not present

## 2022-04-11 DIAGNOSIS — I5022 Chronic systolic (congestive) heart failure: Secondary | ICD-10-CM | POA: Diagnosis not present

## 2022-04-11 DIAGNOSIS — N1832 Chronic kidney disease, stage 3b: Secondary | ICD-10-CM | POA: Diagnosis not present

## 2022-04-11 DIAGNOSIS — R7309 Other abnormal glucose: Secondary | ICD-10-CM | POA: Diagnosis not present

## 2022-04-11 DIAGNOSIS — E1122 Type 2 diabetes mellitus with diabetic chronic kidney disease: Secondary | ICD-10-CM | POA: Diagnosis not present

## 2022-04-16 ENCOUNTER — Other Ambulatory Visit: Payer: Self-pay | Admitting: Cardiology

## 2022-04-18 ENCOUNTER — Encounter: Payer: Self-pay | Admitting: *Deleted

## 2022-04-18 NOTE — Telephone Encounter (Signed)
This encounter was created in error - please disregard.

## 2022-04-22 DIAGNOSIS — I10 Essential (primary) hypertension: Secondary | ICD-10-CM | POA: Diagnosis not present

## 2022-04-22 DIAGNOSIS — E785 Hyperlipidemia, unspecified: Secondary | ICD-10-CM | POA: Diagnosis not present

## 2022-04-22 DIAGNOSIS — E1129 Type 2 diabetes mellitus with other diabetic kidney complication: Secondary | ICD-10-CM | POA: Diagnosis not present

## 2022-05-16 ENCOUNTER — Ambulatory Visit (INDEPENDENT_AMBULATORY_CARE_PROVIDER_SITE_OTHER): Payer: Medicare Other | Admitting: *Deleted

## 2022-05-16 DIAGNOSIS — Z5181 Encounter for therapeutic drug level monitoring: Secondary | ICD-10-CM

## 2022-05-16 DIAGNOSIS — I4891 Unspecified atrial fibrillation: Secondary | ICD-10-CM

## 2022-05-16 LAB — POCT INR: INR: 2.5 (ref 2.0–3.0)

## 2022-05-16 NOTE — Patient Instructions (Signed)
Continue warfarin 1/2 tablet daily except 1 tablet on Mondays and Thursdays. Recheck in 7 weeks. 

## 2022-06-20 ENCOUNTER — Telehealth: Payer: Self-pay | Admitting: Cardiology

## 2022-06-20 NOTE — Telephone Encounter (Signed)
Pt c/o medication issue:  1. Name of Medication: warfarin (COUMADIN) 10 MG tablet  2. How are you currently taking this medication (dosage and times per day)?   3. Are you having a reaction (difficulty breathing--STAT)?   4. What is your medication issue? Pharmacy calling to state that this medications manufacturer is currently out of stock for this, and is requesting to switch to a different [facility] for this medication. Please advise.

## 2022-06-20 NOTE — Telephone Encounter (Signed)
Spoke with Pharmacy at [facility].  OK to change manufacture.

## 2022-07-11 ENCOUNTER — Ambulatory Visit: Payer: Medicare Other | Attending: Cardiology | Admitting: *Deleted

## 2022-07-11 DIAGNOSIS — Z5181 Encounter for therapeutic drug level monitoring: Secondary | ICD-10-CM | POA: Insufficient documentation

## 2022-07-11 DIAGNOSIS — I4891 Unspecified atrial fibrillation: Secondary | ICD-10-CM | POA: Insufficient documentation

## 2022-07-11 LAB — POCT INR: INR: 2.8 (ref 2.0–3.0)

## 2022-07-11 NOTE — Patient Instructions (Signed)
Continue warfarin 1/2 tablet daily except 1 tablet on Mondays and Thursdays. Recheck in 7 weeks. 

## 2022-07-30 ENCOUNTER — Other Ambulatory Visit: Payer: Self-pay | Admitting: Cardiology

## 2022-08-23 DIAGNOSIS — Z23 Encounter for immunization: Secondary | ICD-10-CM | POA: Diagnosis not present

## 2022-08-29 ENCOUNTER — Ambulatory Visit: Payer: Medicare Other | Attending: Cardiology | Admitting: *Deleted

## 2022-08-29 DIAGNOSIS — I4891 Unspecified atrial fibrillation: Secondary | ICD-10-CM | POA: Diagnosis not present

## 2022-08-29 DIAGNOSIS — Z5181 Encounter for therapeutic drug level monitoring: Secondary | ICD-10-CM | POA: Diagnosis not present

## 2022-08-29 LAB — POCT INR: INR: 2.6 (ref 2.0–3.0)

## 2022-08-29 NOTE — Patient Instructions (Signed)
Continue warfarin 1/2 tablet daily except 1 tablet on Mondays and Thursdays. Recheck in 7 weeks.

## 2022-09-17 ENCOUNTER — Other Ambulatory Visit: Payer: Self-pay | Admitting: Cardiology

## 2022-09-23 DIAGNOSIS — E1129 Type 2 diabetes mellitus with other diabetic kidney complication: Secondary | ICD-10-CM | POA: Diagnosis not present

## 2022-09-23 DIAGNOSIS — I5022 Chronic systolic (congestive) heart failure: Secondary | ICD-10-CM | POA: Diagnosis not present

## 2022-09-23 DIAGNOSIS — G4733 Obstructive sleep apnea (adult) (pediatric): Secondary | ICD-10-CM | POA: Diagnosis not present

## 2022-09-23 DIAGNOSIS — N1832 Chronic kidney disease, stage 3b: Secondary | ICD-10-CM | POA: Diagnosis not present

## 2022-10-04 ENCOUNTER — Encounter: Payer: Self-pay | Admitting: Cardiology

## 2022-10-04 ENCOUNTER — Ambulatory Visit: Payer: Medicare Other | Attending: Cardiology | Admitting: *Deleted

## 2022-10-04 ENCOUNTER — Encounter: Payer: Self-pay | Admitting: Internal Medicine

## 2022-10-04 ENCOUNTER — Ambulatory Visit: Payer: Medicare Other | Attending: Cardiology | Admitting: Cardiology

## 2022-10-04 VITALS — BP 102/60 | HR 95 | Ht 69.0 in | Wt 214.8 lb

## 2022-10-04 DIAGNOSIS — E782 Mixed hyperlipidemia: Secondary | ICD-10-CM | POA: Insufficient documentation

## 2022-10-04 DIAGNOSIS — D6869 Other thrombophilia: Secondary | ICD-10-CM | POA: Insufficient documentation

## 2022-10-04 DIAGNOSIS — I10 Essential (primary) hypertension: Secondary | ICD-10-CM | POA: Diagnosis not present

## 2022-10-04 DIAGNOSIS — I4891 Unspecified atrial fibrillation: Secondary | ICD-10-CM | POA: Diagnosis not present

## 2022-10-04 DIAGNOSIS — I5022 Chronic systolic (congestive) heart failure: Secondary | ICD-10-CM | POA: Diagnosis not present

## 2022-10-04 DIAGNOSIS — Z5181 Encounter for therapeutic drug level monitoring: Secondary | ICD-10-CM | POA: Diagnosis not present

## 2022-10-04 LAB — POCT INR: INR: 2.6 (ref 2.0–3.0)

## 2022-10-04 NOTE — Patient Instructions (Signed)
Continue warfarin 1/2 tablet daily except 1 tablet on Mondays and Thursdays. Recheck in 8 weeks.

## 2022-10-04 NOTE — Patient Instructions (Signed)
Medication Instructions:  Your physician recommends that you continue on your current medications as directed. Please refer to the Current Medication list given to you today.   Labwork: None  Testing/Procedures: None  Follow-Up: Follow up with Dr. Branch in 6 months.   Any Other Special Instructions Will Be Listed Below (If Applicable).     If you need a refill on your cardiac medications before your next appointment, please call your pharmacy.  

## 2022-10-04 NOTE — Progress Notes (Signed)
Clinical Summary Joshua [name] is a 81 y.o.male seen today for follow up of the following medical problems.     1. Afib - he failed extended course of amiodarone along with prior DCCV 05/30/16. Have been working toward rate control strategy - off dilt due to low LVEF. Remains on coreg.     - not interested in NOACs due to cost.      - no recent palpitations - compliant with meds. No bleeding on coumadin.         2. Hyperlipidemia - was previously on prava, developed muscle aches. Stopped taking approx December, symptoms resolved. - reports he has tried multiple other statins with Dr Willey Blade - he is on zetia currently and tolerating.        -very high TGs in 400s, vascepa was too expensive so started fenofibrate '145mg'$  daily.  - 07/2020 TC 122 TG 200 HDL 27 LDL 61 08/2021 TC 158 TG 335 HDL 28 LDL 76 - upcoming labs with pcp     3. HTN - he is compliant with meds     4. Chronic systolic HF - echo 06/3569 LVEF 30-35%, severe basal infeiror hypokinesis. Cannot eval diasotlic function. 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 He has not been interested in medication changes. Since no med changes we have not repeated echo.    08/2019 echo: LVEF 40-45%     no SOB/DOE, no recent edema. Takes lasix every other day   5. DM2 - followed by pcp     Several goose and duck hunting trips coming up [place] country.   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 180 tablet 3   Cholecalciferol (VITAMIN D3) 50 MCG (2000 UT) CHEW Chew by mouth. Once daily     digoxin (LANOXIN) 0.125 MG tablet Take 1 tablet by mouth once daily 90 tablet 1   diphenhydrAMINE (BENADRYL) 25 MG tablet Take 25 mg by mouth daily as needed for allergies.     ezetimibe (ZETIA) 10 MG tablet Take 1 tablet (10 mg total) by mouth daily. 90 tablet 3   fenofibrate (TRICOR) 145 MG tablet TAKE 1 TABLET BY MOUTH ONCE DAILY (DISCONTINUE  VASCEPA) 90 tablet 3   furosemide (LASIX) 40 MG tablet TAKE 1 TABLET BY MOUTH EVERY OTHER DAY 45 tablet 1   lisinopril (ZESTRIL) 10 MG tablet Take 1 tablet by mouth once daily 90 tablet 1   metFORMIN (GLUCOPHAGE) 500 MG tablet Take 500 mg by mouth 2 (two) times daily  with a meal.     warfarin (COUMADIN) 10 MG tablet TAKE 1/2 TO 1 (ONE-HALF TO ONE) TABLET BY MOUTH AS DIRECTED ONCE DAILY BY THE COUMADIN CLINIC 70 tablet 3   No current facility-administered medications for this visit.     Past Surgical History:  Procedure Laterality Date   APPLICATION OF A-CELL OF EXTREMITY Right 09/15/2020   Procedure: placement of primatrix AG mesh;  Surgeon: Cindra Presume, MD;  Location: [facility];  Service: Plastics;  Laterality: Right;   CARDIAC CATHETERIZATION N/A 03/04/2016   Procedure: Left Heart Cath and Coronary Angiography;  Surgeon: Troy Sine, MD;  Location: [facility];  Service: Cardiovascular;  Laterality: N/A;   CARDIOVERSION N/A 05/30/2016   Procedure: CARDIOVERSION;  Surgeon: Arnoldo Lenis, MD;  Location: [facility];  Service: Endoscopy;  Laterality: N/A;   CATARACT EXTRACTION  W/PHACO Left 01/03/2017   Procedure: CATARACT EXTRACTION PHACO AND INTRAOCULAR LENS PLACEMENT (IOC);  Surgeon: Rutherford Guys, MD;  Location: [facility];  Service: Ophthalmology;  Laterality: Left;  CDE: 5.74   COLONOSCOPY  03/10/2005   I & D EXTREMITY Right 09/15/2020   Procedure: Debridement of right foot wound;  Surgeon: Cindra Presume, MD;  Location: [facility];  Service: Plastics;  Laterality: Right;  1 hour total   TOTAL KNEE ARTHROPLASTY Right 01/14/2013   Procedure: TOTAL KNEE ARTHROPLASTY;  Surgeon: Gearlean Alf, MD;  Location: [facility];  Service: Orthopedics;  Laterality: Right;   TOTAL KNEE ARTHROPLASTY Left 03/10/2014   Procedure: LEFT TOTAL KNEE ARTHROPLASTY;  Surgeon: Gearlean Alf, MD;  Location: [facility];  Service: Orthopedics;  Laterality: Left;   VENTRAL HERNIA REPAIR  03/2008, 02/2009     No Known Allergies    Family History  Problem Relation Age of Onset   Heart attack Father      Social History Joshua [name] reports that he quit smoking about 4 years ago. His smoking use included cigars. He has never used smokeless tobacco. Joshua [name] reports no history of alcohol use.   Review of Systems CONSTITUTIONAL: No weight loss, fever, chills, weakness or fatigue.  HEENT: Eyes: No visual loss, blurred vision, double vision or yellow sclerae.No hearing loss, sneezing, congestion, runny nose or sore throat.  SKIN: No rash or itching.  CARDIOVASCULAR: per hpi RESPIRATORY: No shortness of breath, cough or sputum.  GASTROINTESTINAL: No anorexia, nausea, vomiting or diarrhea. No abdominal pain or blood.  GENITOURINARY: No burning on urination, no polyuria NEUROLOGICAL: No headache, dizziness, syncope, paralysis, ataxia, numbness or tingling in the extremities. No change in bowel or bladder control.  MUSCULOSKELETAL: No muscle, back pain, joint pain or stiffness.  LYMPHATICS: No enlarged nodes. No history of splenectomy.  PSYCHIATRIC: No history of depression or anxiety.   ENDOCRINOLOGIC: No reports of sweating, cold or heat intolerance. No polyuria or polydipsia.  Marland Kitchen   Physical Examination Today's Vitals   10/04/22 1118  BP: 102/60  Pulse: 95  SpO2: 99%  Weight: 214 lb 12.8 oz (97.4 kg)  Height: '5\' 9"'$  (1.753 m)   Body mass index is 31.72 kg/m.  Gen: resting comfortably, no acute distress HEENT: no scleral icterus, pupils equal round and reactive, no palptable cervical adenopathy,  CV: irreg, no mr/g no jvd Resp: Clear to auscultation bilaterally GI: abdomen is soft, non-tender, non-distended, normal bowel sounds, no hepatosplenomegaly MSK: extremities are warm, no edema.  Skin: warm, no rash Neuro:  no focal deficits Psych: appropriate affect   Diagnostic Studies  Echo 09/2010: LVEF 55%,  mild LVH, mild MR, mod LAE.     02/2016 Echo Study Conclusions   - Left ventricle: The cavity size was moderately dilated. Wall   thickness was at the upper limits of normal. Systolic function   was moderately to severely reduced. The estimated ejection   fraction was in the range of 30% to 35%. Diffuse hypokinesis.   There is severe hypokinesis of the basalinferior myocardium. The   study is not technically sufficient to allow evaluation of LV   diastolic function. - Mitral valve: Mildly thickened leaflets . There was mild   regurgitation. - Left atrium: The atrium was severely dilated. - Right ventricle: The cavity size was mildly dilated. Systolic   function was mildly reduced. - Right atrium: The atrium was severely dilated. - Atrial septum: No defect or patent foramen ovale was identified. - Tricuspid valve: There was trivial regurgitation. Peak RV-RA   gradient (S): 26 mm Hg. - Pericardium, extracardiac: There was no pericardial effusion.   Impressions:   - Upper normal LV wall thickness with moderate chamber dilatation   and LVEF approximately 30-35% in the setting of atrial   fibrillation. LVEF decreased compared to previous study in  2011.   Diffuse hypokinesis, most prominent in the inferior wall.   Indeterminate diastolic function. Severe biatrial enlargement.   Mildly thickened mitral leaflets with mild mitral regurgitation.   Mildly dilated RV with reduced contraction. Trivial tricuspid   regurgitation, RV-RA gradient 26 mmHg. Unable to assess CVP.   02/2016 Cath Prox LAD lesion, 10% stenosed. Mid LAD lesion, 20% stenosed. Ost 3rd Diag lesion, 40% stenosed. Prox RCA lesion, 20% stenosed.     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  Left ventriculography was not done.    RECOMMENDATION: Medical therapy for his cardiomyopathy with improved rate control of his atrial fibrillation.     Assessment and Plan   1. Afib/acquired thrombophilia - prefers coumadin to NOACs due to cost -denies symptoms, continue current meds    2. Hyperlipidemia - intolerant to statins, has been on zetia - High TGs.  Vascepa too expensive, started on fenofibrate -upcoming labs with pcp   3. HTN -he is at goal, continue current meds    4. Chronic HFrEF - Aldactone stopped prevoiusly due to hyperkalemia - has not been able to change to entresto due to cost - we discussed possibly farxiga, historically new medication costs have been an issue (entresto, NOAC, vacepa, etc). He has not been interested in medication changes.  - we will continue current meds, no recent symptms - may consider rechecking echo at next visit to reassess LVEF   F/u 6 months    Arnoldo Lenis, M.D.

## 2022-10-07 ENCOUNTER — Other Ambulatory Visit: Payer: Self-pay | Admitting: Cardiology

## 2022-10-14 DIAGNOSIS — E1122 Type 2 diabetes mellitus with diabetic chronic kidney disease: Secondary | ICD-10-CM | POA: Diagnosis not present

## 2022-10-14 DIAGNOSIS — N1832 Chronic kidney disease, stage 3b: Secondary | ICD-10-CM | POA: Diagnosis not present

## 2022-10-14 DIAGNOSIS — I5022 Chronic systolic (congestive) heart failure: Secondary | ICD-10-CM | POA: Diagnosis not present

## 2022-10-14 DIAGNOSIS — I4821 Permanent atrial fibrillation: Secondary | ICD-10-CM | POA: Diagnosis not present

## 2022-10-18 ENCOUNTER — Other Ambulatory Visit: Payer: Self-pay | Admitting: Cardiology

## 2022-10-23 DIAGNOSIS — E1129 Type 2 diabetes mellitus with other diabetic kidney complication: Secondary | ICD-10-CM | POA: Diagnosis not present

## 2022-10-23 DIAGNOSIS — I10 Essential (primary) hypertension: Secondary | ICD-10-CM | POA: Diagnosis not present

## 2022-10-23 DIAGNOSIS — E785 Hyperlipidemia, unspecified: Secondary | ICD-10-CM | POA: Diagnosis not present

## 2022-11-28 ENCOUNTER — Ambulatory Visit: Payer: Medicare Other | Attending: Cardiology | Admitting: *Deleted

## 2022-11-28 DIAGNOSIS — E119 Type 2 diabetes mellitus without complications: Secondary | ICD-10-CM | POA: Diagnosis not present

## 2022-11-28 DIAGNOSIS — Z5181 Encounter for therapeutic drug level monitoring: Secondary | ICD-10-CM

## 2022-11-28 DIAGNOSIS — I4891 Unspecified atrial fibrillation: Secondary | ICD-10-CM

## 2022-11-28 LAB — POCT INR: INR: 2.1 (ref 2.0–3.0)

## 2022-11-28 NOTE — Patient Instructions (Signed)
Continue warfarin 1/2 tablet daily except 1 tablet on Mondays and Thursdays. Recheck in 8 weeks.

## 2022-12-16 ENCOUNTER — Telehealth: Payer: Self-pay | Admitting: Cardiology

## 2022-12-16 NOTE — Telephone Encounter (Signed)
Pt c/o medication issue:  1. Name of Medication: digoxin (LANOXIN) 0.125 MG tablet   2. How are you currently taking this medication (dosage and times per day)? 1 tablet daily  3. Are you having a reaction (difficulty breathing--STAT)? no  4. What is your medication issue? Estill Bamberg with [facility] in [place] calling to request an okay to change the manufacturer for the medication. Phone: [phone]

## 2022-12-16 NOTE — Telephone Encounter (Signed)
Will fwd to pharmD for advice.

## 2022-12-19 NOTE — Telephone Encounter (Signed)
Response faxed to [facility]

## 2022-12-19 NOTE — Telephone Encounter (Signed)
Ok to Advertising copywriter.

## 2023-01-05 DIAGNOSIS — I4891 Unspecified atrial fibrillation: Secondary | ICD-10-CM | POA: Diagnosis not present

## 2023-01-05 DIAGNOSIS — M1990 Unspecified osteoarthritis, unspecified site: Secondary | ICD-10-CM | POA: Diagnosis not present

## 2023-01-05 DIAGNOSIS — I10 Essential (primary) hypertension: Secondary | ICD-10-CM | POA: Diagnosis not present

## 2023-01-05 DIAGNOSIS — N1832 Chronic kidney disease, stage 3b: Secondary | ICD-10-CM | POA: Diagnosis not present

## 2023-01-05 DIAGNOSIS — G4730 Sleep apnea, unspecified: Secondary | ICD-10-CM | POA: Diagnosis not present

## 2023-01-05 DIAGNOSIS — D696 Thrombocytopenia, unspecified: Secondary | ICD-10-CM | POA: Diagnosis not present

## 2023-01-05 DIAGNOSIS — E1129 Type 2 diabetes mellitus with other diabetic kidney complication: Secondary | ICD-10-CM | POA: Diagnosis not present

## 2023-01-05 DIAGNOSIS — E785 Hyperlipidemia, unspecified: Secondary | ICD-10-CM | POA: Diagnosis not present

## 2023-01-13 ENCOUNTER — Encounter: Payer: Self-pay | Admitting: Internal Medicine

## 2023-01-13 DIAGNOSIS — N1832 Chronic kidney disease, stage 3b: Secondary | ICD-10-CM | POA: Diagnosis not present

## 2023-01-13 DIAGNOSIS — E785 Hyperlipidemia, unspecified: Secondary | ICD-10-CM | POA: Diagnosis not present

## 2023-01-13 DIAGNOSIS — R7309 Other abnormal glucose: Secondary | ICD-10-CM | POA: Diagnosis not present

## 2023-01-13 DIAGNOSIS — E1122 Type 2 diabetes mellitus with diabetic chronic kidney disease: Secondary | ICD-10-CM | POA: Diagnosis not present

## 2023-01-13 DIAGNOSIS — T466X5A Adverse effect of antihyperlipidemic and antiarteriosclerotic drugs, initial encounter: Secondary | ICD-10-CM | POA: Diagnosis not present

## 2023-01-13 DIAGNOSIS — I5022 Chronic systolic (congestive) heart failure: Secondary | ICD-10-CM | POA: Diagnosis not present

## 2023-01-18 ENCOUNTER — Ambulatory Visit: Payer: Medicare Other | Attending: Internal Medicine | Admitting: *Deleted

## 2023-01-18 DIAGNOSIS — I4891 Unspecified atrial fibrillation: Secondary | ICD-10-CM | POA: Diagnosis not present

## 2023-01-18 DIAGNOSIS — Z5181 Encounter for therapeutic drug level monitoring: Secondary | ICD-10-CM | POA: Insufficient documentation

## 2023-01-18 LAB — POCT INR: INR: 2.8 (ref 2.0–3.0)

## 2023-01-18 NOTE — Patient Instructions (Signed)
Continue warfarin 1/2 tablet daily except 1 tablet on Mondays and Thursdays. Recheck in 8 weeks. 

## 2023-01-21 ENCOUNTER — Other Ambulatory Visit: Payer: Self-pay | Admitting: Cardiology

## 2023-01-23 NOTE — Telephone Encounter (Signed)
Refill request for warfarin:  Last INR was 2.8 on 01/18/23 Next INR due 03/13/23 LOV was 10/04/22  Refill approved.

## 2023-03-09 ENCOUNTER — Other Ambulatory Visit: Payer: Self-pay | Admitting: Cardiology

## 2023-03-13 ENCOUNTER — Ambulatory Visit: Payer: Medicare Other | Attending: Cardiology | Admitting: *Deleted

## 2023-03-13 DIAGNOSIS — Z5181 Encounter for therapeutic drug level monitoring: Secondary | ICD-10-CM | POA: Diagnosis not present

## 2023-03-13 DIAGNOSIS — I4891 Unspecified atrial fibrillation: Secondary | ICD-10-CM | POA: Diagnosis not present

## 2023-03-13 LAB — POCT INR: INR: 2.8 (ref 2.0–3.0)

## 2023-03-13 NOTE — Patient Instructions (Signed)
Continue warfarin 1/2 tablet daily except 1 tablet on Mondays and Thursdays. Recheck in 8 weeks. 

## 2023-03-17 ENCOUNTER — Other Ambulatory Visit: Payer: Self-pay | Admitting: Cardiology

## 2023-04-01 ENCOUNTER — Other Ambulatory Visit: Payer: Self-pay | Admitting: Cardiology

## 2023-04-14 DIAGNOSIS — Z79899 Other long term (current) drug therapy: Secondary | ICD-10-CM | POA: Diagnosis not present

## 2023-04-14 DIAGNOSIS — I5022 Chronic systolic (congestive) heart failure: Secondary | ICD-10-CM | POA: Diagnosis not present

## 2023-04-14 DIAGNOSIS — I4821 Permanent atrial fibrillation: Secondary | ICD-10-CM | POA: Diagnosis not present

## 2023-04-14 DIAGNOSIS — E1129 Type 2 diabetes mellitus with other diabetic kidney complication: Secondary | ICD-10-CM | POA: Diagnosis not present

## 2023-04-14 DIAGNOSIS — N1832 Chronic kidney disease, stage 3b: Secondary | ICD-10-CM | POA: Diagnosis not present

## 2023-04-21 ENCOUNTER — Ambulatory Visit: Payer: Medicare Other | Attending: Cardiology | Admitting: Cardiology

## 2023-04-21 ENCOUNTER — Encounter: Payer: Self-pay | Admitting: Cardiology

## 2023-04-21 VITALS — BP 100/70 | HR 72 | Ht 69.0 in | Wt 213.8 lb

## 2023-04-21 DIAGNOSIS — D6869 Other thrombophilia: Secondary | ICD-10-CM | POA: Insufficient documentation

## 2023-04-21 DIAGNOSIS — I4891 Unspecified atrial fibrillation: Secondary | ICD-10-CM | POA: Insufficient documentation

## 2023-04-21 DIAGNOSIS — N1832 Chronic kidney disease, stage 3b: Secondary | ICD-10-CM | POA: Diagnosis not present

## 2023-04-21 DIAGNOSIS — R7309 Other abnormal glucose: Secondary | ICD-10-CM | POA: Diagnosis not present

## 2023-04-21 DIAGNOSIS — E1122 Type 2 diabetes mellitus with diabetic chronic kidney disease: Secondary | ICD-10-CM | POA: Diagnosis not present

## 2023-04-21 DIAGNOSIS — E782 Mixed hyperlipidemia: Secondary | ICD-10-CM | POA: Insufficient documentation

## 2023-04-21 DIAGNOSIS — I482 Chronic atrial fibrillation, unspecified: Secondary | ICD-10-CM | POA: Diagnosis not present

## 2023-04-21 DIAGNOSIS — I10 Essential (primary) hypertension: Secondary | ICD-10-CM | POA: Diagnosis not present

## 2023-04-21 DIAGNOSIS — I5022 Chronic systolic (congestive) heart failure: Secondary | ICD-10-CM | POA: Diagnosis not present

## 2023-04-21 MED ORDER — LISINOPRIL 10 MG PO TABS: 10.0000 mg | ORAL_TABLET | Freq: Every day | ORAL | 1 refills | Status: DC

## 2023-04-21 NOTE — Progress Notes (Signed)
Clinical Summary Joshua [name] is a 82 y.o.male seen today for follow up of the following medical problems.      1. Afib - he failed extended course of amiodarone along with prior DCCV 05/30/16. Have been working toward rate control strategy - off dilt due to low LVEF. Remains on coreg.     - not interested in NOACs due to cost.         - no recent palpitations - compliant with meds, no bleeding on coumadin       2. Hyperlipidemia - was previously on prava, developed muscle aches. Stopped taking approx December, symptoms resolved. - reports he has tried multiple other statins with Dr Ouida Sills - he is on zetia currently and tolerating.        -very high TGs in 400s, vascepa was too expensive so started fenofibrate 145mg  daily.  - 07/2020 TC 122 TG 200 HDL 27 LDL 61 08/2021 TC 158 TG 335 HDL 28 LDL 76 - upcoming labs with pcp  12/2022 TC 168 TG 358 HDL 31 LDL 79     3. HTN - he is compliant with meds     4. Chronic systolic HF - echo 02/2016 LVEF 30-35%, severe basal infeiror hypokinesis. Cannot eval diasotlic function. 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 He has not been interested in medication changes. Since no med changes we have not repeated echo.    08/2019 echo: LVEF 40-45%      no recent SOB/DOE, no recent edema - compliant with meds   5. DM2 - followed by pcp       Several goose and duck hunting trips coming [place] country.  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   BPH (benign prostatic hyperplasia)    Chronic anticoagulation    Chronic systolic dysfunction of left ventricle    EF 35%   Degenerative joint disease    Right knee   Diabetes  mellitus without complication (HCC)    Hyperlipidemia    Hypertension    Longstanding persistent atrial fibrillation (HCC)    Nonischemic cardiomyopathy (HCC)    Obesity    Pre-diabetes    Metformin   Sleep apnea    Rx-CPAP     No Known Allergies   Current Outpatient Medications  Medication Sig Dispense Refill   carvedilol (COREG) 25 MG tablet Take 1 tablet by mouth twice daily 180 tablet 1   Cholecalciferol (VITAMIN D3) 50 MCG (2000 UT) CHEW Chew by mouth. Once daily     digoxin (LANOXIN) 0.125 MG tablet Take 1 tablet by mouth once daily 90 tablet 0   diphenhydrAMINE (BENADRYL) 25 MG tablet Take 25 mg by mouth daily as needed for allergies.     ezetimibe (ZETIA) 10 MG tablet Take 1 tablet by mouth once daily 90 tablet 1   fenofibrate (TRICOR) 145 MG tablet TAKE 1 TABLET BY MOUTH ONCE DAILY (DISCONTINUE  VASCEPA) 90 tablet 3   furosemide (LASIX) 40 MG tablet TAKE 1 TABLET BY MOUTH EVERY OTHER DAY 45 tablet 3   lisinopril (ZESTRIL) 10 MG tablet Take 1 tablet by mouth once daily 90 tablet 0   metFORMIN (GLUCOPHAGE) 500 MG tablet Take 500  mg by mouth 2 (two) times daily with a meal.     warfarin (COUMADIN) 10 MG tablet TAKE 1/2 TO 1 (ONE-HALF TO ONE) TABLET BY MOUTH AS DIRECTED ONCE DAILY BY  THE  COUMADIN  CLINIC 70 tablet 3   No current facility-administered medications for this visit.     Past Surgical History:  Procedure Laterality Date   APPLICATION OF A-CELL OF EXTREMITY Right 09/15/2020   Procedure: placement of primatrix AG mesh;  Surgeon: Allena Napoleon, MD;  Location: [facility];  Service: Plastics;  Laterality: Right;   CARDIAC CATHETERIZATION N/A 03/04/2016   Procedure: Left Heart Cath and Coronary Angiography;  Surgeon: Lennette Bihari, MD;  Location: [facility];  Service: Cardiovascular;  Laterality: N/A;   CARDIOVERSION N/A 05/30/2016   Procedure: CARDIOVERSION;  Surgeon: Antoine Poche, MD;  Location: [facility];  Service: Endoscopy;  Laterality: N/A;   CATARACT  EXTRACTION W/PHACO Left 01/03/2017   Procedure: CATARACT EXTRACTION PHACO AND INTRAOCULAR LENS PLACEMENT (IOC);  Surgeon: Jethro Bolus, MD;  Location: [facility];  Service: Ophthalmology;  Laterality: Left;  CDE: 5.74   COLONOSCOPY  03/10/2005   I & D EXTREMITY Right 09/15/2020   Procedure: Debridement of right foot wound;  Surgeon: Allena Napoleon, MD;  Location: [facility];  Service: Plastics;  Laterality: Right;  1 hour total   TOTAL KNEE ARTHROPLASTY Right 01/14/2013   Procedure: TOTAL KNEE ARTHROPLASTY;  Surgeon: Loanne Drilling, MD;  Location: [facility];  Service: Orthopedics;  Laterality: Right;   TOTAL KNEE ARTHROPLASTY Left 03/10/2014   Procedure: LEFT TOTAL KNEE ARTHROPLASTY;  Surgeon: Loanne Drilling, MD;  Location: [facility];  Service: Orthopedics;  Laterality: Left;   VENTRAL HERNIA REPAIR  03/2008, 02/2009     No Known Allergies    Family History  Problem Relation Age of Onset   Heart attack Father      Social History Mr. Som reports that he quit smoking about 5 years ago. His smoking use included cigars. He has never used smokeless tobacco. Mr. Bennington reports no history of alcohol use.   Review of Systems CONSTITUTIONAL: No weight loss, fever, chills, weakness or fatigue.  HEENT: Eyes: No visual loss, blurred vision, double vision or yellow sclerae.No hearing loss, sneezing, congestion, runny nose or sore throat.  SKIN: No rash or itching.  CARDIOVASCULAR: per hpi RESPIRATORY: No shortness of breath, cough or sputum.  GASTROINTESTINAL: No anorexia, nausea, vomiting or diarrhea. No abdominal pain or blood.  GENITOURINARY: No burning on urination, no polyuria NEUROLOGICAL: No headache, dizziness, syncope, paralysis, ataxia, numbness or tingling in the extremities. No change in bowel or bladder control.  MUSCULOSKELETAL: No muscle, back pain, joint pain or stiffness.  LYMPHATICS: No enlarged nodes. No history of splenectomy.  PSYCHIATRIC: No history of depression or anxiety.   ENDOCRINOLOGIC: No reports of sweating, cold or heat intolerance. No polyuria or polydipsia.  Marland Kitchen   Physical Examination Today's Vitals   04/21/23 1023  BP: 100/70  Pulse: 72  SpO2: 98%  Weight: 213 lb 12.8 oz (97 kg)  Height: 5\' 9"  (1.753 m)   Body mass index is 31.57 kg/m.  Gen: resting comfortably, no acute distress HEENT: no scleral icterus, pupils equal round and reactive, no palptable cervical adenopathy,  CV: irreg, no m/rg, no jvd Resp: Clear to auscultation bilaterally GI: abdomen is soft, non-tender, non-distended, normal bowel sounds, no hepatosplenomegaly MSK: extremities are warm, no edema.  Skin: warm, no rash Neuro:  no focal deficits Psych: appropriate  affect   Diagnostic Studies  Echo 09/2010: LVEF 55%, mild LVH, mild MR, mod LAE.     02/2016 Echo Study Conclusions   - Left ventricle: The cavity size was moderately dilated. Wall   thickness was at the upper limits of normal. Systolic function   was moderately to severely reduced. The estimated ejection   fraction was in the range of 30% to 35%. Diffuse hypokinesis.   There is severe hypokinesis of the basalinferior myocardium. The   study is not technically sufficient to allow evaluation of LV   diastolic function. - Mitral valve: Mildly thickened leaflets . There was mild   regurgitation. - Left atrium: The atrium was severely dilated. - Right ventricle: The cavity size was mildly dilated. Systolic   function was mildly reduced. - Right atrium: The atrium was severely dilated. - Atrial septum: No defect or patent foramen ovale was identified. - Tricuspid valve: There was trivial regurgitation. Peak RV-RA   gradient (S): 26 mm Hg. - Pericardium, extracardiac: There was no pericardial effusion.   Impressions:   - Upper normal LV wall thickness with moderate chamber dilatation   and LVEF approximately 30-35% in the setting of atrial   fibrillation. LVEF decreased compared to previous study in  2011.   Diffuse hypokinesis, most prominent in the inferior wall.   Indeterminate diastolic function. Severe biatrial enlargement.   Mildly thickened mitral leaflets with mild mitral regurgitation.   Mildly dilated RV with reduced contraction. Trivial tricuspid   regurgitation, RV-RA gradient 26 mmHg. Unable to assess CVP.   02/2016 Cath Prox LAD lesion, 10% stenosed. Mid LAD lesion, 20% stenosed. Ost 3rd Diag lesion, 40% stenosed. Prox RCA lesion, 20% stenosed.     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  Left ventriculography was not done.    RECOMMENDATION: Medical therapy for his cardiomyopathy with improved rate control of his atrial fibrillation.       Assessment and Plan  1. Afib/acquired thrombophilia - prefers coumadin to NOACs due to cost - no symptoms, continue current meds    2. Hyperlipidemia - intolerant to statins, has been on zetia - High TGs.  Vascepa too expensive, started on fenofibrate -LDL at goal, TGs remain elevated but limited on other options given costs of vascepa for him. Continue dietary modification   3. HTN - at goal, continue current meds    4. Chronic HFrEF - Aldactone stopped prevoiusly due to hyperkalemia - has not been able to change to entresto due to cost - we discussed possibly farxiga, historically new medication costs have been an issue (entresto, NOAC, vacepa, etc). He has not been interested in medication changes.   - no symptoms, continue current meds   F/u 6 months      Antoine Poche, M.D.

## 2023-04-21 NOTE — Patient Instructions (Signed)
Medication Instructions:   Lisinopril refilled today Continue all other medications.     Labwork:  none  Testing/Procedures:  none  Follow-Up:  6 months   Any Other Special Instructions Will Be Listed Below (If Applicable).   If you need a refill on your cardiac medications before your next appointment, please call your pharmacy.

## 2023-05-08 ENCOUNTER — Ambulatory Visit: Payer: Medicare Other | Attending: Cardiology | Admitting: *Deleted

## 2023-05-08 DIAGNOSIS — Z5181 Encounter for therapeutic drug level monitoring: Secondary | ICD-10-CM | POA: Diagnosis not present

## 2023-05-08 DIAGNOSIS — I4891 Unspecified atrial fibrillation: Secondary | ICD-10-CM | POA: Insufficient documentation

## 2023-05-08 LAB — POCT INR: INR: 2.5 (ref 2.0–3.0)

## 2023-05-08 NOTE — Patient Instructions (Signed)
 Continue warfarin 1/2 tablet daily except 1 tablet on Mondays and Thursdays. Recheck in 8 weeks.

## 2023-06-09 ENCOUNTER — Other Ambulatory Visit: Payer: Self-pay | Admitting: Cardiology

## 2023-07-05 ENCOUNTER — Ambulatory Visit: Payer: Medicare Other | Attending: Cardiology | Admitting: *Deleted

## 2023-07-05 DIAGNOSIS — I4891 Unspecified atrial fibrillation: Secondary | ICD-10-CM | POA: Insufficient documentation

## 2023-07-05 DIAGNOSIS — Z5181 Encounter for therapeutic drug level monitoring: Secondary | ICD-10-CM | POA: Diagnosis not present

## 2023-07-05 LAB — POCT INR: INR: 3 (ref 2.0–3.0)

## 2023-07-05 NOTE — Patient Instructions (Signed)
Continue warfarin 1/2 tablet daily except 1 tablet on Mondays and Thursdays. Recheck in 8 weeks.

## 2023-07-28 DIAGNOSIS — I5022 Chronic systolic (congestive) heart failure: Secondary | ICD-10-CM | POA: Diagnosis not present

## 2023-07-28 DIAGNOSIS — N1832 Chronic kidney disease, stage 3b: Secondary | ICD-10-CM | POA: Diagnosis not present

## 2023-07-28 DIAGNOSIS — E1129 Type 2 diabetes mellitus with other diabetic kidney complication: Secondary | ICD-10-CM | POA: Diagnosis not present

## 2023-07-28 DIAGNOSIS — I482 Chronic atrial fibrillation, unspecified: Secondary | ICD-10-CM | POA: Diagnosis not present

## 2023-08-03 DIAGNOSIS — E1122 Type 2 diabetes mellitus with diabetic chronic kidney disease: Secondary | ICD-10-CM | POA: Diagnosis not present

## 2023-08-03 DIAGNOSIS — Z23 Encounter for immunization: Secondary | ICD-10-CM | POA: Diagnosis not present

## 2023-08-03 DIAGNOSIS — I5022 Chronic systolic (congestive) heart failure: Secondary | ICD-10-CM | POA: Diagnosis not present

## 2023-08-03 DIAGNOSIS — N1832 Chronic kidney disease, stage 3b: Secondary | ICD-10-CM | POA: Diagnosis not present

## 2023-08-18 ENCOUNTER — Ambulatory Visit: Payer: Medicare Other | Attending: Cardiology | Admitting: Cardiology

## 2023-08-18 ENCOUNTER — Encounter: Payer: Self-pay | Admitting: Cardiology

## 2023-08-18 VITALS — BP 134/74 | HR 68 | Ht 70.0 in | Wt 218.0 lb

## 2023-08-18 DIAGNOSIS — D6869 Other thrombophilia: Secondary | ICD-10-CM | POA: Diagnosis not present

## 2023-08-18 DIAGNOSIS — I4821 Permanent atrial fibrillation: Secondary | ICD-10-CM | POA: Diagnosis not present

## 2023-08-18 DIAGNOSIS — E782 Mixed hyperlipidemia: Secondary | ICD-10-CM

## 2023-08-18 DIAGNOSIS — I5022 Chronic systolic (congestive) heart failure: Secondary | ICD-10-CM | POA: Diagnosis not present

## 2023-08-18 DIAGNOSIS — I10 Essential (primary) hypertension: Secondary | ICD-10-CM | POA: Diagnosis not present

## 2023-08-18 NOTE — Progress Notes (Signed)
Clinical Summary Joshua [name] is a 82 y.o.male seen today for follow up of the following medical problems.      1. Afib - he failed extended course of amiodarone along with prior DCCV 05/30/16. Have been working toward rate control strategy - off dilt due to low LVEF. Remains on coreg.  - not interested in NOACs due to cost.    - no recent palpitations - compliant with meds - no bleeding on coumadin      2. Hyperlipidemia - was previously on prava, developed muscle aches. Stopped taking approx December, symptoms resolved. - reports he has tried multiple other statins with Dr Ouida Sills - he is on zetia currently and tolerating.        -very high TGs in 400s, vascepa was too expensive so started fenofibrate 145mg  daily.  - 07/2020 TC 122 TG 200 HDL 27 LDL 61 08/2021 TC 158 TG 335 HDL 28 LDL 76 - upcoming labs with pcp   12/2022 TC 168 TG 358 HDL 31 LDL 79     3. HTN - compliant with meds   4. Chronic systolic HF - echo 02/2016 LVEF 30-35%, severe basal infeiror hypokinesis. Cannot eval diasotlic function. 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 He has not been interested in medication changes. Since no med changes we have not repeated echo.    08/2019 echo: LVEF 40-45%     - no SOB/DOE, no recent edema - compliant with emds   5. DM2 - followed by pcp       Several goose and duck hunting trips coming up across country.  Been going catfishing recently.  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   BPH (benign prostatic hyperplasia)    Chronic anticoagulation    Chronic systolic dysfunction of left ventricle    EF 35%   Degenerative joint disease    Right knee    Diabetes mellitus without complication (HCC)    Hyperlipidemia    Hypertension    Longstanding persistent atrial fibrillation (HCC)    Nonischemic cardiomyopathy (HCC)    Obesity    Pre-diabetes    Metformin   Sleep apnea    Rx-CPAP     No Known Allergies   Current Outpatient Medications  Medication Sig Dispense Refill   carvedilol (COREG) 25 MG tablet Take 1 tablet by mouth twice daily 180 tablet 1   Cholecalciferol (VITAMIN D3) 50 MCG (2000 UT) CHEW Chew by mouth. Once daily     digoxin (LANOXIN) 0.125 MG tablet Take 1 tablet by mouth once daily 90 tablet 2   diphenhydrAMINE (BENADRYL) 25 MG tablet Take 25 mg by mouth daily as needed for allergies.     ezetimibe (ZETIA) 10 MG tablet Take 1 tablet by mouth once daily 90 tablet 1   fenofibrate (TRICOR) 145 MG tablet TAKE 1 TABLET BY MOUTH ONCE DAILY (DISCONTINUE  VASCEPA) 90 tablet 3   furosemide (LASIX) 40 MG tablet TAKE 1 TABLET BY MOUTH EVERY OTHER DAY 45 tablet 3   lisinopril (ZESTRIL) 10 MG tablet Take 1 tablet (10 mg total) by mouth daily. 90 tablet 1   metFORMIN (GLUCOPHAGE) 500 MG tablet Take 500 mg by mouth 2 (two)  times daily with a meal.     warfarin (COUMADIN) 10 MG tablet TAKE 1/2 TO 1 (ONE-HALF TO ONE) TABLET BY MOUTH AS DIRECTED ONCE DAILY BY  THE  COUMADIN  CLINIC 70 tablet 3   No current facility-administered medications for this visit.     Past Surgical History:  Procedure Laterality Date   APPLICATION OF A-CELL OF EXTREMITY Right 09/15/2020   Procedure: placement of primatrix AG mesh;  Surgeon: Allena Napoleon, MD;  Location: [facility];  Service: Plastics;  Laterality: Right;   CARDIAC CATHETERIZATION N/A 03/04/2016   Procedure: Left Heart Cath and Coronary Angiography;  Surgeon: Lennette Bihari, MD;  Location: [facility] INVASIVE CV LAB;  Service: Cardiovascular;  Laterality: N/A;   CARDIOVERSION N/A 05/30/2016   Procedure: CARDIOVERSION;  Surgeon: Antoine Poche, MD;  Location: [facility]S;  Service: Endoscopy;  Laterality:  N/A;   CATARACT EXTRACTION W/PHACO Left 01/03/2017   Procedure: CATARACT EXTRACTION PHACO AND INTRAOCULAR LENS PLACEMENT (IOC);  Surgeon: Jethro Bolus, MD;  Location: [facility];  Service: Ophthalmology;  Laterality: Left;  CDE: 5.74   COLONOSCOPY  03/10/2005   I & D EXTREMITY Right 09/15/2020   Procedure: Debridement of right foot wound;  Surgeon: Allena Napoleon, MD;  Location: [facility];  Service: Plastics;  Laterality: Right;  1 hour total   TOTAL KNEE ARTHROPLASTY Right 01/14/2013   Procedure: TOTAL KNEE ARTHROPLASTY;  Surgeon: Loanne Drilling, MD;  Location: [facility];  Service: Orthopedics;  Laterality: Right;   TOTAL KNEE ARTHROPLASTY Left 03/10/2014   Procedure: LEFT TOTAL KNEE ARTHROPLASTY;  Surgeon: Loanne Drilling, MD;  Location: [facility];  Service: Orthopedics;  Laterality: Left;   VENTRAL HERNIA REPAIR  03/2008, 02/2009     No Known Allergies    Family History  Problem Relation Age of Onset   Heart attack Father      Social History Joshua [name] reports that he quit smoking about 5 years ago. His smoking use included cigars. He has never used smokeless tobacco. Joshua [name] reports no history of alcohol use.   Review of Systems CONSTITUTIONAL: No weight loss, fever, chills, weakness or fatigue.  HEENT: Eyes: No visual loss, blurred vision, double vision or yellow sclerae.No hearing loss, sneezing, congestion, runny nose or sore throat.  SKIN: No rash or itching.  CARDIOVASCULAR: per hpi RESPIRATORY: No shortness of breath, cough or sputum.  GASTROINTESTINAL: No anorexia, nausea, vomiting or diarrhea. No abdominal pain or blood.  GENITOURINARY: No burning on urination, no polyuria NEUROLOGICAL: No headache, dizziness, syncope, paralysis, ataxia, numbness or tingling in the extremities. No change in bowel or bladder control.  MUSCULOSKELETAL: No muscle, back pain, joint pain or stiffness.  LYMPHATICS: No enlarged nodes. No history of splenectomy.  PSYCHIATRIC: No history of  depression or anxiety.  ENDOCRINOLOGIC: No reports of sweating, cold or heat intolerance. No polyuria or polydipsia.  Marland Kitchen   Physical Examination Today's Vitals   08/18/23 0954  BP: 134/74  Pulse: 68  SpO2: 97%  Weight: 218 lb (98.9 kg)  Height: 5\' 10"  (1.778 m)   Body mass index is 31.28 kg/m.  Gen: resting comfortably, no acute distress HEENT: no scleral icterus, pupils equal round and reactive, no palptable cervical adenopathy,  CV: irreg, no mrg, no jvd Resp: Clear to auscultation bilaterally GI: abdomen is soft, non-tender, non-distended, normal bowel sounds, no hepatosplenomegaly MSK: extremities are warm, no edema.  Skin: warm, no rash Neuro:  no focal deficits Psych: appropriate affect   Diagnostic Studies  Echo  09/2010: LVEF 55%, mild LVH, mild MR, mod LAE.     02/2016 Echo Study Conclusions   - Left ventricle: The cavity size was moderately dilated. Wall   thickness was at the upper limits of normal. Systolic function   was moderately to severely reduced. The estimated ejection   fraction was in the range of 30% to 35%. Diffuse hypokinesis.   There is severe hypokinesis of the basalinferior myocardium. The   study is not technically sufficient to allow evaluation of LV   diastolic function. - Mitral valve: Mildly thickened leaflets . There was mild   regurgitation. - Left atrium: The atrium was severely dilated. - Right ventricle: The cavity size was mildly dilated. Systolic   function was mildly reduced. - Right atrium: The atrium was severely dilated. - Atrial septum: No defect or patent foramen ovale was identified. - Tricuspid valve: There was trivial regurgitation. Peak RV-RA   gradient (S): 26 mm Hg. - Pericardium, extracardiac: There was no pericardial effusion.   Impressions:   - Upper normal LV wall thickness with moderate chamber dilatation   and LVEF approximately 30-35% in the setting of atrial   fibrillation. LVEF decreased compared to  previous study in 2011.   Diffuse hypokinesis, most prominent in the inferior wall.   Indeterminate diastolic function. Severe biatrial enlargement.   Mildly thickened mitral leaflets with mild mitral regurgitation.   Mildly dilated RV with reduced contraction. Trivial tricuspid   regurgitation, RV-RA gradient 26 mmHg. Unable to assess CVP.   02/2016 Cath Prox LAD lesion, 10% stenosed. Mid LAD lesion, 20% stenosed. Ost 3rd Diag lesion, 40% stenosed. Prox RCA lesion, 20% stenosed.     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  Left ventriculography was not done.    RECOMMENDATION: Medical therapy for his cardiomyopathy with improved rate control of his atrial fibrillation.       Assessment and Plan   1.Permanent Afib/acquired thrombophilia - prefers coumadin to NOACs due to cost - no recent symptoms, continue current meds    2. Hyperlipidemia - intolerant to statins, has been on zetia - High TGs.  Vascepa too expensive, started on fenofibrate -continue current meds, labs followed by pcp   3. HTN - at goal, continue current meds    4. Chronic HFrEF - Aldactone stopped prevoiusly due to hyperkalemia - has not been able to change to entresto due to cost - we discussed possibly farxiga, historically new medication costs have been an issue (entresto, NOAC, vacepa, etc). He has not been interested in medication changes.   - euvolemic today without symptoms, continue current meds   F/u 6 months      Antoine Poche, M.D.

## 2023-08-18 NOTE — Patient Instructions (Signed)
Medication Instructions:  Your physician recommends that you continue on your current medications as directed. Please refer to the Current Medication list given to you today.  *If you need a refill on your cardiac medications before your next appointment, please call your pharmacy*   Lab Work: None If you have labs (blood work) drawn today and your tests are completely normal, you will receive your results only by: MyChart Message (if you have MyChart) OR A paper copy in the mail If you have any lab test that is abnormal or we need to change your treatment, we will call you to review the results.   Testing/Procedures: None   Follow-Up: At [facility], you and your health needs are our priority.  As part of our continuing mission to provide you with exceptional heart care, we have created designated Provider Care Teams.  These Care Teams include your primary Cardiologist (physician) and Advanced Practice Providers (APPs -  Physician Assistants and Nurse Practitioners) who all work together to provide you with the care you need, when you need it.  We recommend signing up for the patient portal called "MyChart".  Sign up information is provided on this After Visit Summary.  MyChart is used to connect with patients for Virtual Visits (Telemedicine).  Patients are able to view lab/test results encounter notes, upcoming appointments, etc.  Non-urgent messages can be sent to your provider as well.   To learn more about what you can do with MyChart, go to [email or web address].    Your next appointment:   6 month(s)  Provider:   You may see Dina Rich, MD or one of the following Advanced Practice Providers on your designated Care Team:   Randall An, PA-C  Jacolyn Reedy, [place]     Other Instructions

## 2023-09-05 ENCOUNTER — Ambulatory Visit: Payer: Medicare Other | Attending: Cardiology | Admitting: *Deleted

## 2023-09-05 DIAGNOSIS — Z5181 Encounter for therapeutic drug level monitoring: Secondary | ICD-10-CM | POA: Insufficient documentation

## 2023-09-05 DIAGNOSIS — I4891 Unspecified atrial fibrillation: Secondary | ICD-10-CM | POA: Insufficient documentation

## 2023-09-05 LAB — POCT INR: INR: 2.8 (ref 2.0–3.0)

## 2023-09-05 NOTE — Patient Instructions (Signed)
Continue warfarin 1/2 tablet daily except 1 tablet on Mondays and Thursdays. Recheck in 8 weeks.

## 2023-09-15 ENCOUNTER — Other Ambulatory Visit: Payer: Self-pay | Admitting: Cardiology

## 2023-09-22 ENCOUNTER — Other Ambulatory Visit: Payer: Self-pay | Admitting: Cardiology

## 2023-09-28 ENCOUNTER — Other Ambulatory Visit: Payer: Self-pay | Admitting: Cardiology

## 2023-10-07 ENCOUNTER — Other Ambulatory Visit: Payer: Self-pay | Admitting: Cardiology

## 2023-10-21 ENCOUNTER — Other Ambulatory Visit: Payer: Self-pay | Admitting: Cardiology

## 2023-10-30 ENCOUNTER — Ambulatory Visit: Payer: Medicare Other | Attending: Cardiology | Admitting: *Deleted

## 2023-10-30 ENCOUNTER — Ambulatory Visit: Payer: No Typology Code available for payment source

## 2023-10-30 DIAGNOSIS — Z5181 Encounter for therapeutic drug level monitoring: Secondary | ICD-10-CM | POA: Insufficient documentation

## 2023-10-30 DIAGNOSIS — I4891 Unspecified atrial fibrillation: Secondary | ICD-10-CM | POA: Diagnosis not present

## 2023-10-30 LAB — POCT INR: INR: 2.2 (ref 2.0–3.0)

## 2023-10-30 NOTE — Patient Instructions (Signed)
Continue warfarin 1/2 tablet daily except 1 tablet on Mondays and Thursdays. Recheck in 8 weeks.

## 2023-11-20 DIAGNOSIS — N1832 Chronic kidney disease, stage 3b: Secondary | ICD-10-CM | POA: Diagnosis not present

## 2023-11-20 DIAGNOSIS — Z79899 Other long term (current) drug therapy: Secondary | ICD-10-CM | POA: Diagnosis not present

## 2023-11-20 DIAGNOSIS — E1129 Type 2 diabetes mellitus with other diabetic kidney complication: Secondary | ICD-10-CM | POA: Diagnosis not present

## 2023-11-20 DIAGNOSIS — I5022 Chronic systolic (congestive) heart failure: Secondary | ICD-10-CM | POA: Diagnosis not present

## 2023-11-20 DIAGNOSIS — I482 Chronic atrial fibrillation, unspecified: Secondary | ICD-10-CM | POA: Diagnosis not present

## 2023-11-27 DIAGNOSIS — I5022 Chronic systolic (congestive) heart failure: Secondary | ICD-10-CM | POA: Diagnosis not present

## 2023-11-27 DIAGNOSIS — N1832 Chronic kidney disease, stage 3b: Secondary | ICD-10-CM | POA: Diagnosis not present

## 2023-11-27 DIAGNOSIS — E1122 Type 2 diabetes mellitus with diabetic chronic kidney disease: Secondary | ICD-10-CM | POA: Diagnosis not present

## 2023-12-04 DIAGNOSIS — E119 Type 2 diabetes mellitus without complications: Secondary | ICD-10-CM | POA: Diagnosis not present

## 2023-12-25 ENCOUNTER — Ambulatory Visit: Payer: No Typology Code available for payment source | Attending: Cardiology | Admitting: *Deleted

## 2023-12-25 DIAGNOSIS — I4891 Unspecified atrial fibrillation: Secondary | ICD-10-CM | POA: Diagnosis not present

## 2023-12-25 DIAGNOSIS — Z5181 Encounter for therapeutic drug level monitoring: Secondary | ICD-10-CM | POA: Diagnosis not present

## 2023-12-25 LAB — POCT INR: INR: 2.5 (ref 2.0–3.0)

## 2023-12-25 NOTE — Patient Instructions (Signed)
Continue warfarin 1/2 tablet daily except 1 tablet on Mondays and Thursdays. Recheck in 8 weeks.

## 2024-01-15 ENCOUNTER — Ambulatory Visit: Attending: Cardiology | Admitting: Cardiology

## 2024-01-15 ENCOUNTER — Encounter: Payer: Self-pay | Admitting: Cardiology

## 2024-01-15 VITALS — BP 122/86 | HR 114 | Ht 70.0 in | Wt 215.4 lb

## 2024-01-15 DIAGNOSIS — I4891 Unspecified atrial fibrillation: Secondary | ICD-10-CM | POA: Diagnosis not present

## 2024-01-15 DIAGNOSIS — Z79899 Other long term (current) drug therapy: Secondary | ICD-10-CM | POA: Insufficient documentation

## 2024-01-15 DIAGNOSIS — I10 Essential (primary) hypertension: Secondary | ICD-10-CM | POA: Insufficient documentation

## 2024-01-15 DIAGNOSIS — E782 Mixed hyperlipidemia: Secondary | ICD-10-CM | POA: Insufficient documentation

## 2024-01-15 DIAGNOSIS — I5022 Chronic systolic (congestive) heart failure: Secondary | ICD-10-CM | POA: Insufficient documentation

## 2024-01-15 NOTE — Addendum Note (Signed)
 Addended by: Lesle Chris on: 01/15/2024 08:53 AM   Modules accepted: Orders

## 2024-01-15 NOTE — Patient Instructions (Addendum)
 Medication Instructions:   Continue all current medications.    Labwork:  FLP - order given  Reminder:  Nothing to eat or drink after 12 midnight prior to labs. Office will contact with results via phone, letter or mychart.     Testing/Procedures:  none  Follow-Up:  6 months   Any Other Special Instructions Will Be Listed Below (If Applicable).  1 weeks nurse visit EKG  If you need a refill on your cardiac medications before your next appointment, please call your pharmacy.

## 2024-01-15 NOTE — Progress Notes (Signed)
 Clinical Summary Joshua [name] is a 83 y.o.male seen today for follow up of the following medical problems.      1. Afib - he failed extended course of amiodarone along with prior DCCV 05/30/16. Have been working toward rate control strategy - off dilt due to low LVEF. Remains on coreg.  - not interested in NOACs due to cost.    - no recent palpitations - compliant with meds - no bleeding on coumadin        2. Hyperlipidemia - was previously on prava, developed muscle aches. Stopped taking approx December, symptoms resolved. - reports he has tried multiple other statins with Dr Ouida Sills - he is on zetia currently and tolerating.        -very high TGs in 400s, vascepa was too expensive so started fenofibrate 145mg  daily.  - 07/2020 TC 122 TG 200 HDL 27 LDL 61 08/2021 TC 158 TG 335 HDL 28 LDL 76 - upcoming labs with pcp   12/2022 TC 168 TG 358 HDL 31 LDL 79 - due for repeat labs     3. HTN - remains compliant with meds   4. Chronic systolic HF - echo 02/2016 LVEF 30-35%, severe basal infeiror hypokinesis. Cannot eval diasotlic function. 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 He has not been interested in medication changes. Since no med changes we have not repeated echo.    08/2019 echo: LVEF 40-45%     -denies any SOB/DOE, no recent edema - compliant with meds   5. DM2 - followed by pcp       Several goose and duck hunting trips coming up across country.  Been going catfishing recently.  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   BPH (benign prostatic hyperplasia)    Chronic anticoagulation    Chronic systolic dysfunction of left ventricle    EF 35%    Degenerative joint disease    Right knee   Diabetes mellitus without complication (HCC)    Hyperlipidemia    Hypertension    Longstanding persistent atrial fibrillation (HCC)    Nonischemic cardiomyopathy (HCC)    Obesity    Pre-diabetes    Metformin   Sleep apnea    Rx-CPAP     No Known Allergies   Current Outpatient Medications  Medication Sig Dispense Refill   carvedilol (COREG) 25 MG tablet Take 1 tablet by mouth twice daily 180 tablet 2   Cholecalciferol (VITAMIN D3) 50 MCG (2000 UT) CHEW Chew by mouth. Once daily     digoxin (LANOXIN) 0.125 MG tablet Take 1 tablet by mouth once daily 90 tablet 2   diphenhydrAMINE (BENADRYL) 25 MG tablet Take 25 mg by mouth daily as needed for allergies.     ezetimibe (ZETIA) 10 MG tablet Take 1 tablet by mouth once daily 90 tablet 3   fenofibrate (TRICOR) 145 MG tablet TAKE 1 TABLET BY MOUTH ONCE DAILY (  STOP  VASCEPA) 90 tablet 3   furosemide (LASIX) 40 MG tablet TAKE 1 TABLET BY MOUTH EVERY OTHER DAY 45 tablet 3   lisinopril (ZESTRIL) 10 MG tablet Take 1 tablet by mouth once daily 90 tablet 1   metFORMIN (GLUCOPHAGE) 500 MG  tablet Take 500 mg by mouth 2 (two) times daily with a meal.     warfarin (COUMADIN) 10 MG tablet TAKE 1/2 TO 1 (ONE-HALF TO ONE) TABLET BY MOUTH AS DIRECTED ONCE DAILY BY  THE  COUMADIN  CLINIC 70 tablet 3   No current facility-administered medications for this visit.     Past Surgical History:  Procedure Laterality Date   APPLICATION OF A-CELL OF EXTREMITY Right 09/15/2020   Procedure: placement of primatrix AG mesh;  Surgeon: Allena Napoleon, MD;  Location: [facility] OR;  Service: Plastics;  Laterality: Right;   CARDIAC CATHETERIZATION N/A 03/04/2016   Procedure: Left Heart Cath and Coronary Angiography;  Surgeon: Lennette Bihari, MD;  Location: [facility] INVASIVE CV LAB;  Service: Cardiovascular;  Laterality: N/A;   CARDIOVERSION N/A 05/30/2016   Procedure: CARDIOVERSION;  Surgeon: Antoine Poche, MD;  Location: [facility];   Service: Endoscopy;  Laterality: N/A;   CATARACT EXTRACTION W/PHACO Left 01/03/2017   Procedure: CATARACT EXTRACTION PHACO AND INTRAOCULAR LENS PLACEMENT (IOC);  Surgeon: Jethro Bolus, MD;  Location: [facility];  Service: Ophthalmology;  Laterality: Left;  CDE: 5.74   COLONOSCOPY  03/10/2005   I & D EXTREMITY Right 09/15/2020   Procedure: Debridement of right foot wound;  Surgeon: Allena Napoleon, MD;  Location: [facility] OR;  Service: Plastics;  Laterality: Right;  1 hour total   TOTAL KNEE ARTHROPLASTY Right 01/14/2013   Procedure: TOTAL KNEE ARTHROPLASTY;  Surgeon: Loanne Drilling, MD;  Location: [facility];  Service: Orthopedics;  Laterality: Right;   TOTAL KNEE ARTHROPLASTY Left 03/10/2014   Procedure: LEFT TOTAL KNEE ARTHROPLASTY;  Surgeon: Loanne Drilling, MD;  Location: [facility];  Service: Orthopedics;  Laterality: Left;   VENTRAL HERNIA REPAIR  03/2008, 02/2009     No Known Allergies    Family History  Problem Relation Age of Onset   Heart attack Father      Social History Joshua [name] reports that he quit smoking about 5 years ago. His smoking use included cigars. He has never used smokeless tobacco. Joshua [name] reports no history of alcohol use.     Physical Examination Today's Vitals   01/15/24 0822  BP: 122/86  Pulse: (!) 114  SpO2: 100%  Weight: 215 lb 6.4 oz (97.7 kg)  Height: 5\' 10"  (1.778 m)   Body mass index is 30.91 kg/m.  Gen: resting comfortably, no acute distress HEENT: no scleral icterus, pupils equal round and reactive, no palptable cervical adenopathy,  CV: irreg, tachy, no mrg, no jvd Resp: Clear to auscultation bilaterally GI: abdomen is soft, non-tender, non-distended, normal bowel sounds, no hepatosplenomegaly MSK: extremities are warm, no edema.  Skin: warm, no rash Neuro:  no focal deficits Psych: appropriate affect   Diagnostic Studies  Echo 09/2010: LVEF 55%, mild LVH, mild MR, mod LAE.     02/2016 Echo Study Conclusions   - Left ventricle: The  cavity size was moderately dilated. Wall   thickness was at the upper limits of normal. Systolic function   was moderately to severely reduced. The estimated ejection   fraction was in the range of 30% to 35%. Diffuse hypokinesis.   There is severe hypokinesis of the basalinferior myocardium. The   study is not technically sufficient to allow evaluation of LV   diastolic function. - Mitral valve: Mildly thickened leaflets . There was mild   regurgitation. - Left atrium: The atrium was severely dilated. - Right ventricle: The cavity size was mildly dilated. Systolic  function was mildly reduced. - Right atrium: The atrium was severely dilated. - Atrial septum: No defect or patent foramen ovale was identified. - Tricuspid valve: There was trivial regurgitation. Peak RV-RA   gradient (S): 26 mm Hg. - Pericardium, extracardiac: There was no pericardial effusion.   Impressions:   - Upper normal LV wall thickness with moderate chamber dilatation   and LVEF approximately 30-35% in the setting of atrial   fibrillation. LVEF decreased compared to previous study in 2011.   Diffuse hypokinesis, most prominent in the inferior wall.   Indeterminate diastolic function. Severe biatrial enlargement.   Mildly thickened mitral leaflets with mild mitral regurgitation.   Mildly dilated RV with reduced contraction. Trivial tricuspid   regurgitation, RV-RA gradient 26 mmHg. Unable to assess CVP.   02/2016 Cath Prox LAD lesion, 10% stenosed. Mid LAD lesion, 20% stenosed. Ost 3rd Diag lesion, 40% stenosed. Prox RCA lesion, 20% stenosed.     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  Left ventriculography was not done.    RECOMMENDATION: Medical therapy for his cardiomyopathy with improved rate control of his atrial  fibrillation.       Assessment and Plan    1.Permanent Afib/acquired thrombophilia - prefers coumadin to NOACs due to cost - no symptoms. EKG shows mild tachycardia 114, previously rates had been well controlled. Nursing visit 1 week for repeat EKG, if rates remain elevated would icnrease coreg to 37.5mg  bid    2. Hyperlipidemia - intolerant to statins, has been on zetia - High TGs.  Vascepa too expensive, started on fenofibrate -repeat lipid panel   3. HTN - bp is at goal, continue current meds    4. Chronic HFrEF - Aldactone stopped prevoiusly due to hyperkalemia - has not been able to change to entresto due to cost - we discussed possibly farxiga, historically new medication costs have been an issue (entresto, NOAC, vacepa, etc). He has not been interested in medication changes.   - denies significant symptoms, he is euvolemic today - continue current meds      Antoine Poche, M.D.

## 2024-01-16 DIAGNOSIS — Z79899 Other long term (current) drug therapy: Secondary | ICD-10-CM | POA: Diagnosis not present

## 2024-01-16 DIAGNOSIS — E782 Mixed hyperlipidemia: Secondary | ICD-10-CM | POA: Diagnosis not present

## 2024-01-17 LAB — LIPID PANEL
Chol/HDL Ratio: 5 ratio (ref 0.0–5.0)
Cholesterol, Total: 141 mg/dL (ref 100–199)
HDL: 28 mg/dL — ABNORMAL LOW (ref >39)
LDL Chol Calc (NIH): 65 mg/dL (ref 0–99)
Triglycerides: 303 mg/dL — ABNORMAL HIGH (ref 0–149)
VLDL Cholesterol Cal: 48 mg/dL — ABNORMAL HIGH (ref 5–40)

## 2024-01-22 ENCOUNTER — Telehealth: Payer: Self-pay | Admitting: Cardiology

## 2024-01-22 ENCOUNTER — Ambulatory Visit: Attending: Cardiology

## 2024-01-22 DIAGNOSIS — I10 Essential (primary) hypertension: Secondary | ICD-10-CM | POA: Insufficient documentation

## 2024-01-22 NOTE — Telephone Encounter (Signed)
 Patient informed and verbalized understanding of plan.

## 2024-01-22 NOTE — Progress Notes (Signed)
 Patient here for EKG per Dr.Branch visit last week  Patient has no CP, SOB, or weakness  Patient states he feels good  HR is 96 today compared to 114 last week   Patient states he had FLP drawn last week and is in patient chart ready to be viewed.  Sent to JB for advice

## 2024-01-22 NOTE — Progress Notes (Signed)
 EKG looks ok, heart rates are improved. No med changes. Will review labs in separate note  Dominga Ferry MD

## 2024-01-22 NOTE — Telephone Encounter (Signed)
-----   Message from Dina Rich sent at 01/22/2024 11:50 AM EDT -----    ----- Message ----- From: Sharen Hones Sent: 01/22/2024   8:17 AM EDT To: Antoine Poche, MD

## 2024-02-05 ENCOUNTER — Telehealth: Payer: Self-pay | Admitting: Cardiology

## 2024-02-05 ENCOUNTER — Other Ambulatory Visit: Payer: Self-pay | Admitting: *Deleted

## 2024-02-05 DIAGNOSIS — I4891 Unspecified atrial fibrillation: Secondary | ICD-10-CM

## 2024-02-05 DIAGNOSIS — Z79899 Other long term (current) drug therapy: Secondary | ICD-10-CM

## 2024-02-05 DIAGNOSIS — Z5181 Encounter for therapeutic drug level monitoring: Secondary | ICD-10-CM

## 2024-02-05 MED ORDER — DIGOXIN 125 MCG PO TABS: 125.0000 ug | ORAL_TABLET | Freq: Every day | ORAL | 1 refills | Status: DC

## 2024-02-05 NOTE — Telephone Encounter (Signed)
 This is fine. Could consider drawing a digoxin level a week after starting

## 2024-02-05 NOTE — Telephone Encounter (Signed)
 Pt c/o medication issue:  1. Name of Medication: digoxin (LANOXIN) 0.125 MG tablet   2. How are you currently taking this medication (dosage and times per day)? N/A  3. Are you having a reaction (difficulty breathing--STAT)? No  4. What is your medication issue? Pharmacy calling needing approval to change manufactures for this medication. Please advise

## 2024-02-05 NOTE — Telephone Encounter (Signed)
 Okay to order Digoxin level per pharm suggestion.

## 2024-02-05 NOTE — Telephone Encounter (Signed)
 [facility] pharmacy notified.  Attempted to notify patient - no answer.

## 2024-02-08 NOTE — Telephone Encounter (Signed)
 Left message to return call

## 2024-02-08 NOTE — Telephone Encounter (Signed)
 Patient notified and verbalized understanding.  Will mail lab order to home & he will do at [facility].

## 2024-02-09 ENCOUNTER — Telehealth: Payer: Self-pay | Admitting: Cardiology

## 2024-02-09 NOTE — Telephone Encounter (Signed)
 Spoke to patient and verbalized it was okay to finish up 0.125 mg Digoxin  from old manufacturer. Patient also advised to have Digoxin  level drawn at [facility] 1 week after starting new manufacturer medication. Patient voiced understanding. Patient had no further questions or concerns at this time. Pt aware lab slip was mailed to his home.

## 2024-02-09 NOTE — Telephone Encounter (Signed)
 Pt c/o medication issue:  1. Name of Medication:   digoxin  (LANOXIN ) 0.125 MG tablet    2. How are you currently taking this medication (dosage and times per day)? As written  3. Are you having a reaction (difficulty breathing--STAT)? no  4. What is your medication issue? Should he finish taking the old pills or start the new pills

## 2024-02-19 ENCOUNTER — Encounter

## 2024-02-26 ENCOUNTER — Ambulatory Visit: Attending: Cardiology | Admitting: *Deleted

## 2024-02-26 DIAGNOSIS — I4891 Unspecified atrial fibrillation: Secondary | ICD-10-CM | POA: Diagnosis not present

## 2024-02-26 DIAGNOSIS — Z79899 Other long term (current) drug therapy: Secondary | ICD-10-CM | POA: Diagnosis not present

## 2024-02-26 DIAGNOSIS — Z5181 Encounter for therapeutic drug level monitoring: Secondary | ICD-10-CM | POA: Insufficient documentation

## 2024-02-26 LAB — POCT INR: INR: 2.8 (ref 2.0–3.0)

## 2024-02-26 NOTE — Patient Instructions (Signed)
Continue warfarin 1/2 tablet daily except 1 tablet on Mondays and Thursdays. Recheck in 8 weeks.

## 2024-02-27 LAB — DIGOXIN LEVEL: Digoxin, Serum: 0.7 ng/mL (ref 0.5–0.9)

## 2024-03-28 ENCOUNTER — Other Ambulatory Visit: Payer: Self-pay | Admitting: Cardiology

## 2024-03-28 DIAGNOSIS — I4891 Unspecified atrial fibrillation: Secondary | ICD-10-CM

## 2024-03-28 NOTE — Telephone Encounter (Signed)
 Prescription refill request received for warfarin Lov: 01/15/24 (Branch)  Next INR check: 04/22/24 Warfarin tablet strength: 10mg   Appropriate dose. Refill sent.

## 2024-04-11 ENCOUNTER — Other Ambulatory Visit: Payer: Self-pay | Admitting: Cardiology

## 2024-04-17 ENCOUNTER — Ambulatory Visit: Attending: Cardiology | Admitting: *Deleted

## 2024-04-17 DIAGNOSIS — Z5181 Encounter for therapeutic drug level monitoring: Secondary | ICD-10-CM | POA: Insufficient documentation

## 2024-04-17 DIAGNOSIS — I4891 Unspecified atrial fibrillation: Secondary | ICD-10-CM | POA: Insufficient documentation

## 2024-04-17 LAB — POCT INR: INR: 3.5 — AB (ref 2.0–3.0)

## 2024-04-17 NOTE — Patient Instructions (Signed)
 Hold warfarin tonight then resume 1/2 tablet daily except 1 tablet on Mondays and Thursdays.  Recheck in 8 weeks.

## 2024-04-17 NOTE — Progress Notes (Signed)
Please see anticoagulation encounter.

## 2024-04-22 ENCOUNTER — Encounter

## 2024-05-15 DIAGNOSIS — Z79899 Other long term (current) drug therapy: Secondary | ICD-10-CM | POA: Diagnosis not present

## 2024-05-15 DIAGNOSIS — I482 Chronic atrial fibrillation, unspecified: Secondary | ICD-10-CM | POA: Diagnosis not present

## 2024-05-15 DIAGNOSIS — I5022 Chronic systolic (congestive) heart failure: Secondary | ICD-10-CM | POA: Diagnosis not present

## 2024-05-15 DIAGNOSIS — E1129 Type 2 diabetes mellitus with other diabetic kidney complication: Secondary | ICD-10-CM | POA: Diagnosis not present

## 2024-05-15 DIAGNOSIS — N1832 Chronic kidney disease, stage 3b: Secondary | ICD-10-CM | POA: Diagnosis not present

## 2024-05-24 DIAGNOSIS — M791 Myalgia, unspecified site: Secondary | ICD-10-CM | POA: Diagnosis not present

## 2024-05-24 DIAGNOSIS — I482 Chronic atrial fibrillation, unspecified: Secondary | ICD-10-CM | POA: Diagnosis not present

## 2024-05-24 DIAGNOSIS — I5022 Chronic systolic (congestive) heart failure: Secondary | ICD-10-CM | POA: Diagnosis not present

## 2024-05-24 DIAGNOSIS — D696 Thrombocytopenia, unspecified: Secondary | ICD-10-CM | POA: Diagnosis not present

## 2024-05-24 DIAGNOSIS — E1122 Type 2 diabetes mellitus with diabetic chronic kidney disease: Secondary | ICD-10-CM | POA: Diagnosis not present

## 2024-05-24 DIAGNOSIS — N1832 Chronic kidney disease, stage 3b: Secondary | ICD-10-CM | POA: Diagnosis not present

## 2024-05-24 DIAGNOSIS — E785 Hyperlipidemia, unspecified: Secondary | ICD-10-CM | POA: Diagnosis not present

## 2024-06-10 ENCOUNTER — Ambulatory Visit: Attending: Cardiology | Admitting: *Deleted

## 2024-06-10 DIAGNOSIS — I4891 Unspecified atrial fibrillation: Secondary | ICD-10-CM | POA: Insufficient documentation

## 2024-06-10 DIAGNOSIS — Z5181 Encounter for therapeutic drug level monitoring: Secondary | ICD-10-CM | POA: Insufficient documentation

## 2024-06-10 LAB — POCT INR: INR: 3.2 — AB (ref 2.0–3.0)

## 2024-06-10 NOTE — Progress Notes (Signed)
 INR 3.2 Please see anticoagulation encounter

## 2024-06-10 NOTE — Patient Instructions (Signed)
 Decrease warfarin to 1/2 tablet daily except 1 tablet on Thursdays.  Recheck in 8 weeks.

## 2024-06-21 ENCOUNTER — Other Ambulatory Visit: Payer: Self-pay | Admitting: Cardiology

## 2024-07-26 ENCOUNTER — Other Ambulatory Visit: Payer: Self-pay | Admitting: Cardiology

## 2024-07-26 DIAGNOSIS — I4891 Unspecified atrial fibrillation: Secondary | ICD-10-CM

## 2024-07-30 ENCOUNTER — Ambulatory Visit: Attending: Cardiology | Admitting: *Deleted

## 2024-07-30 DIAGNOSIS — I4891 Unspecified atrial fibrillation: Secondary | ICD-10-CM | POA: Insufficient documentation

## 2024-07-30 DIAGNOSIS — Z5181 Encounter for therapeutic drug level monitoring: Secondary | ICD-10-CM | POA: Diagnosis not present

## 2024-07-30 LAB — POCT INR: INR: 2.1 (ref 2.0–3.0)

## 2024-07-30 NOTE — Progress Notes (Signed)
 INR 2.1; Please see anticoagulation encounter

## 2024-07-30 NOTE — Patient Instructions (Signed)
 Continue warfarin 1/2 tablet daily except 1 tablet on Thursdays.  Recheck in 8 weeks.

## 2024-08-05 ENCOUNTER — Encounter

## 2024-09-06 ENCOUNTER — Ambulatory Visit: Attending: Cardiology | Admitting: Cardiology

## 2024-09-06 ENCOUNTER — Encounter: Payer: Self-pay | Admitting: Cardiology

## 2024-09-06 VITALS — BP 118/76 | HR 91 | Ht 70.0 in | Wt 212.4 lb

## 2024-09-06 DIAGNOSIS — I5022 Chronic systolic (congestive) heart failure: Secondary | ICD-10-CM | POA: Insufficient documentation

## 2024-09-06 DIAGNOSIS — I4821 Permanent atrial fibrillation: Secondary | ICD-10-CM | POA: Diagnosis not present

## 2024-09-06 DIAGNOSIS — E782 Mixed hyperlipidemia: Secondary | ICD-10-CM | POA: Insufficient documentation

## 2024-09-06 DIAGNOSIS — D6869 Other thrombophilia: Secondary | ICD-10-CM | POA: Diagnosis not present

## 2024-09-06 NOTE — Patient Instructions (Signed)
 Medication Instructions:  Continue all current medications.   Labwork: none  Testing/Procedures: none  Follow-Up: 6 months   Any Other Special Instructions Will Be Listed Below (If Applicable).   If you need a refill on your cardiac medications before your next appointment, please call your pharmacy.

## 2024-09-06 NOTE — Progress Notes (Signed)
 Clinical Summary Joshua [name] is a 83 y.o.male seen today for follow up of the following medical problems.      1. Afib - he failed extended course of amiodarone  along with prior DCCV 05/30/16. Have been working toward rate control strategy - off dilt due to low LVEF. Remains on coreg .  - not interested in NOACs due to cost.    - no recent palpitations - compliant with meds. No bleeding on coumadin .          2. Hyperlipidemia - was previously on prava, developed muscle aches. Stopped taking approx December, symptoms resolved. - reports he has tried multiple other statins with Dr Sheryle - he is on zetia  currently and tolerating.   -very high TGs in 400s, vascepa  was too expensive so started fenofibrate  145mg  daily.   -04/2024 TC 137 TG 259 HDL 28 LDL 67   3. HTN - remains compliant with meds   4. Chronic systolic HF - echo 02/2016 LVEF 30-35%, severe basal infeiror hypokinesis. Cannot eval diasotlic function. 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 He has not been interested in medication changes. Since no med changes we have not repeated echo.    08/2019 echo: LVEF 40-45%     - no SOB/DOE. Walks 5 miles yesterday without symptoms. Goes to [facility] does stationary bike x 1 hr without symptoms daily. - no edema   5. DM2 - followed by pcp       Several goose and duck hunting trips coming up across country.  Been going catfishing recently.  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   BPH (benign prostatic hyperplasia)    Chronic anticoagulation    Chronic systolic dysfunction of left ventricle    EF 35%   Degenerative joint disease    Right knee   Diabetes mellitus without  complication (HCC)    Hyperlipidemia    Hypertension    Longstanding persistent atrial fibrillation (HCC)    Nonischemic cardiomyopathy (HCC)    Obesity    Pre-diabetes    Metformin   Sleep apnea    Rx-CPAP     No Known Allergies   Current Outpatient Medications  Medication Sig Dispense Refill   carvedilol  (COREG ) 25 MG tablet Take 1 tablet by mouth twice daily 180 tablet 2   Cholecalciferol (VITAMIN D3) 50 MCG (2000 UT) CHEW Chew by mouth. Once daily     digoxin  (LANOXIN ) 0.125 MG tablet Take 1 tablet by mouth once daily 90 tablet 1   diphenhydrAMINE  (BENADRYL ) 25 MG tablet Take 25 mg by mouth daily as needed for allergies.     ezetimibe  (ZETIA ) 10 MG tablet Take 1 tablet by mouth once daily 90 tablet 3   fenofibrate  (TRICOR ) 145 MG tablet TAKE 1 TABLET BY MOUTH ONCE DAILY (  STOP  VASCEPA ) 90 tablet 3   furosemide  (LASIX ) 40 MG tablet TAKE 1 TABLET BY MOUTH EVERY OTHER DAY 45 tablet 3   lisinopril  (ZESTRIL ) 10 MG tablet Take 1 tablet by mouth once daily 90 tablet 2   metFORMIN (GLUCOPHAGE) 500 MG tablet Take 500 mg by mouth 2 (two) times daily with a meal.     warfarin (COUMADIN ) 10 MG tablet  TAKE 1/2 TO 1 (ONE-HALF TO ONE) TABLET BY MOUTH ONCE DAILY AS DIRECTED BY  THE  COUMADIN   CLINIC 70 tablet 0   No current facility-administered medications for this visit.     Past Surgical History:  Procedure Laterality Date   APPLICATION OF A-CELL OF EXTREMITY Right 09/15/2020   Procedure: placement of primatrix AG mesh;  Surgeon: Joshua Craig RAMAN, MD;  [name]: Joshua [name];  Service: Plastics;  Laterality: Right;   CARDIAC CATHETERIZATION N/A 03/04/2016   Procedure: Left Heart Cath and Coronary Angiography;  Surgeon: Joshua DELENA Sor, MD;  [name]: Joshua [name];  Service: Cardiovascular;  Laterality: N/A;   CARDIOVERSION N/A 05/30/2016   Procedure: CARDIOVERSION;  Surgeon: Joshua JULIANNA Ross, MD;  [name]: AP [name];  Service: Joshua [name];  Laterality: N/A;   CATARACT EXTRACTION W/PHACO Left  01/03/2017   Procedure: CATARACT EXTRACTION PHACO AND INTRAOCULAR LENS PLACEMENT (IOC);  Surgeon: Joshua Platts, MD;  [name]: AP [name];  Service: Ophthalmology;  Laterality: Left;  CDE: 5.74   COLONOSCOPY  03/10/2005   I & D EXTREMITY Right 09/15/2020   Procedure: Debridement of right foot wound;  Surgeon: Joshua Craig RAMAN, MD;  [name]: Joshua [name];  Service: Plastics;  Laterality: Right;  1 hour total   TOTAL KNEE ARTHROPLASTY Right 01/14/2013   Procedure: TOTAL KNEE ARTHROPLASTY;  Surgeon: Joshua LULLA Moan, MD;  [name]: Joshua [name];  Service: Orthopedics;  Laterality: Right;   TOTAL KNEE ARTHROPLASTY Left 03/10/2014   Procedure: LEFT TOTAL KNEE ARTHROPLASTY;  Surgeon: Joshua LULLA Moan, MD;  [name]: Joshua [name];  Service: Orthopedics;  Laterality: Left;   VENTRAL HERNIA REPAIR  03/2008, 02/2009     No Known Allergies    Family History  Problem Relation Age of Onset   Heart attack Father      Social History Joshua [name] reports that he quit smoking about 6 years ago. His smoking use included cigars. He has never used smokeless tobacco. Joshua [name] reports no history of alcohol use.    Physical Examination Today's Vitals   09/06/24 0828  BP: 118/76  Pulse: 91  SpO2: 95%  Weight: 212 lb 6.4 oz (96.3 kg)  Height: 5' 10 (1.778 m)   Body mass index is 30.48 kg/m.  Gen: resting comfortably, no acute distress HEENT: no scleral icterus, pupils equal round and reactive, no palptable cervical adenopathy,  CV: RRR, no m/rg, no jvd Resp: Clear to auscultation bilaterally GI: abdomen is soft, non-tender, non-distended, normal bowel sounds, no hepatosplenomegaly MSK: extremities are warm, no edema.  Skin: warm, no rash Neuro:  no focal deficits Psych: appropriate affect   Diagnostic Studies  Echo 09/2010: LVEF 55%, mild LVH, mild MR, mod LAE.     02/2016 Echo Study Conclusions   - Left ventricle: The cavity size was moderately dilated. Wall   thickness was at the upper limits of normal.  Systolic function   was moderately to severely reduced. The estimated ejection   fraction was in the range of 30% to 35%. Diffuse hypokinesis.   There is severe hypokinesis of the basalinferior myocardium. The   study is not technically sufficient to allow evaluation of LV   diastolic function. - Mitral valve: Mildly thickened leaflets . There was mild   regurgitation. - Left atrium: The atrium was severely dilated. - Right ventricle: The cavity size was mildly dilated. Systolic   function was mildly reduced. - Right atrium: The atrium was severely dilated. - Atrial septum: No defect [name] patent foramen ovale was identified. - Tricuspid  valve: There was trivial regurgitation. Peak RV-RA   gradient (S): 26 mm Hg. - Pericardium, extracardiac: There was no pericardial effusion.   Impressions:   - Upper normal LV wall thickness with moderate chamber dilatation   and LVEF approximately 30-35% in the setting of atrial   fibrillation. LVEF decreased compared to previous study in 2011.   Diffuse hypokinesis, most prominent in the inferior wall.   Indeterminate diastolic function. Severe biatrial enlargement.   Mildly thickened mitral leaflets with mild mitral regurgitation.   Mildly dilated RV with reduced contraction. Trivial tricuspid   regurgitation, RV-RA gradient 26 mmHg. Unable to assess CVP.   02/2016 Cath Prox LAD lesion, 10% stenosed. Mid LAD lesion, 20% stenosed. Ost 3rd Diag lesion, 40% stenosed. Prox RCA lesion, 20% stenosed.     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  Left ventriculography was not done.    RECOMMENDATION: Medical therapy for his cardiomyopathy with improved rate control of his atrial fibrillation.       Assessment and Plan   1.Permanent Afib/acquired thrombophilia - prefers  coumadin  to NOACs due to cost - no symptoms, continue current meds    2. Hyperlipidemia - intolerant to statins, has been on zetia  - High TGs.  Vascepa  too expensive, started on fenofibrate  -LDL at goal, TGs improving. Discussed ongoing dietary modification.    3. HTN - at goal, continue current meds    4. Chronic HFrEF - Aldactone  stopped prevoiusly due to hyperkalemia - has not been able to change to entresto due to cost - we discussed possibly farxiga, historically new medication costs have been an issue (entresto, NOAC, vacepa, etc). He has not been interested in medication changes.   - no recent symptoms, he is euvolemic today in clinic - continue current meds           Joshua PHEBE [name], M.D.

## 2024-09-09 DIAGNOSIS — Z23 Encounter for immunization: Secondary | ICD-10-CM | POA: Diagnosis not present

## 2024-09-13 ENCOUNTER — Ambulatory Visit: Admitting: Cardiology

## 2024-09-13 ENCOUNTER — Other Ambulatory Visit: Payer: Self-pay | Admitting: Cardiology

## 2024-09-23 ENCOUNTER — Encounter

## 2024-09-27 ENCOUNTER — Other Ambulatory Visit: Payer: Self-pay | Admitting: Cardiology

## 2024-09-27 DIAGNOSIS — N1832 Chronic kidney disease, stage 3b: Secondary | ICD-10-CM | POA: Diagnosis not present

## 2024-09-27 DIAGNOSIS — E1129 Type 2 diabetes mellitus with other diabetic kidney complication: Secondary | ICD-10-CM | POA: Diagnosis not present

## 2024-09-27 DIAGNOSIS — Z79899 Other long term (current) drug therapy: Secondary | ICD-10-CM | POA: Diagnosis not present

## 2024-09-30 ENCOUNTER — Ambulatory Visit: Attending: Cardiology

## 2024-10-04 DIAGNOSIS — I5022 Chronic systolic (congestive) heart failure: Secondary | ICD-10-CM | POA: Diagnosis not present

## 2024-10-04 DIAGNOSIS — E1122 Type 2 diabetes mellitus with diabetic chronic kidney disease: Secondary | ICD-10-CM | POA: Diagnosis not present

## 2024-10-04 DIAGNOSIS — N1832 Chronic kidney disease, stage 3b: Secondary | ICD-10-CM | POA: Diagnosis not present

## 2024-10-12 ENCOUNTER — Other Ambulatory Visit: Payer: Self-pay | Admitting: Cardiology

## 2024-10-14 ENCOUNTER — Ambulatory Visit: Attending: Cardiology | Admitting: *Deleted

## 2024-10-14 DIAGNOSIS — I4891 Unspecified atrial fibrillation: Secondary | ICD-10-CM | POA: Insufficient documentation

## 2024-10-14 DIAGNOSIS — Z5181 Encounter for therapeutic drug level monitoring: Secondary | ICD-10-CM | POA: Diagnosis not present

## 2024-10-14 LAB — POCT INR: INR: 2.3 (ref 2.0–3.0)

## 2024-10-14 NOTE — Progress Notes (Signed)
 INR-2.3; Please see anticoagulation encounter

## 2024-10-14 NOTE — Patient Instructions (Signed)
 Continue warfarin 1/2 tablet daily except 1 tablet on Thursdays.  Recheck in 8 weeks.

## 2024-11-21 ENCOUNTER — Other Ambulatory Visit: Payer: Self-pay | Admitting: Cardiology

## 2024-11-21 DIAGNOSIS — I4891 Unspecified atrial fibrillation: Secondary | ICD-10-CM

## 2024-12-09 ENCOUNTER — Ambulatory Visit
